# Patient Record
Sex: Male | Born: 1935
Health system: Southern US, Community
[De-identification: ages and names within clinical notes are randomized; demographics above are authoritative.]

## PROBLEM LIST (undated history)

## (undated) DIAGNOSIS — B351 Tinea unguium: Secondary | ICD-10-CM

## (undated) DIAGNOSIS — M5416 Radiculopathy, lumbar region: Secondary | ICD-10-CM

## (undated) DIAGNOSIS — M1990 Unspecified osteoarthritis, unspecified site: Secondary | ICD-10-CM

## (undated) DIAGNOSIS — M67919 Unspecified disorder of synovium and tendon, unspecified shoulder: Secondary | ICD-10-CM

## (undated) DIAGNOSIS — K5790 Diverticulosis of intestine, part unspecified, without perforation or abscess without bleeding: Secondary | ICD-10-CM

## (undated) DIAGNOSIS — R200 Anesthesia of skin: Secondary | ICD-10-CM

## (undated) DIAGNOSIS — M5090 Cervical disc disorder, unspecified, unspecified cervical region: Secondary | ICD-10-CM

## (undated) DIAGNOSIS — N400 Enlarged prostate without lower urinary tract symptoms: Secondary | ICD-10-CM

## (undated) DIAGNOSIS — H43819 Vitreous degeneration, unspecified eye: Secondary | ICD-10-CM

## (undated) DIAGNOSIS — R011 Cardiac murmur, unspecified: Secondary | ICD-10-CM

## (undated) DIAGNOSIS — L600 Ingrowing nail: Secondary | ICD-10-CM

## (undated) DIAGNOSIS — K219 Gastro-esophageal reflux disease without esophagitis: Secondary | ICD-10-CM

## (undated) DIAGNOSIS — H4920 Sixth [abducent] nerve palsy, unspecified eye: Secondary | ICD-10-CM

## (undated) DIAGNOSIS — R202 Paresthesia of skin: Secondary | ICD-10-CM

## (undated) DIAGNOSIS — I2510 Atherosclerotic heart disease of native coronary artery without angina pectoris: Secondary | ICD-10-CM

## (undated) DIAGNOSIS — R7301 Impaired fasting glucose: Secondary | ICD-10-CM

## (undated) HISTORY — DX: Ingrowing nail: L60.0

## (undated) HISTORY — PX: CARDIAC CATHETERIZATION: SHX172

## (undated) HISTORY — DX: Tinea unguium: B35.1

## (undated) HISTORY — DX: Vitreous degeneration, unspecified eye: H43.819

## (undated) HISTORY — PX: BACK SURGERY: SHX140

## (undated) HISTORY — DX: Unspecified disorder of synovium and tendon, unspecified shoulder: M67.919

## (undated) HISTORY — DX: Anesthesia of skin: R20.0

## (undated) HISTORY — DX: Anesthesia of skin: R20.2

## (undated) HISTORY — DX: Cervical disc disorder, unspecified, unspecified cervical region: M50.90

## (undated) HISTORY — DX: Benign prostatic hyperplasia without lower urinary tract symptoms: N40.0

## (undated) HISTORY — DX: Sixth (abducent) nerve palsy, unspecified eye: H49.20

## (undated) HISTORY — DX: Diverticulosis of intestine, part unspecified, without perforation or abscess without bleeding: K57.90

## (undated) HISTORY — DX: Cardiac murmur, unspecified: R01.1

## (undated) HISTORY — PX: OTHER SURGICAL HISTORY: SHX169

## (undated) HISTORY — DX: Impaired fasting glucose: R73.01

## (undated) HISTORY — DX: Radiculopathy, lumbar region: M54.16

## (undated) HISTORY — DX: Unspecified osteoarthritis, unspecified site: M19.90

## (undated) HISTORY — PX: SPINE SURGERY: SHX786

---
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
## 1963-09-18 HISTORY — PX: HEMORRHOID SURGERY: SHX153

## 1994-09-17 HISTORY — PX: HERNIA REPAIR: SHX51

## 1999-08-24 ENCOUNTER — Encounter: Payer: Self-pay | Admitting: Internal Medicine

## 1999-08-24 ENCOUNTER — Encounter: Admission: RE | Admit: 1999-08-24 | Discharge: 1999-08-24 | Payer: Self-pay | Admitting: Internal Medicine

## 1999-09-18 DIAGNOSIS — H4920 Sixth [abducent] nerve palsy, unspecified eye: Secondary | ICD-10-CM

## 1999-09-18 HISTORY — DX: Sixth (abducent) nerve palsy, unspecified eye: H49.20

## 1999-10-03 ENCOUNTER — Encounter: Payer: Self-pay | Admitting: Ophthalmology

## 1999-10-03 ENCOUNTER — Ambulatory Visit (HOSPITAL_COMMUNITY): Admission: RE | Admit: 1999-10-03 | Discharge: 1999-10-03 | Payer: Self-pay | Admitting: Ophthalmology

## 2007-07-17 ENCOUNTER — Ambulatory Visit (HOSPITAL_BASED_OUTPATIENT_CLINIC_OR_DEPARTMENT_OTHER): Admission: RE | Admit: 2007-07-17 | Discharge: 2007-07-18 | Payer: Self-pay | Admitting: Orthopedic Surgery

## 2007-07-17 HISTORY — PX: ROTATOR CUFF REPAIR: SHX139

## 2008-06-24 ENCOUNTER — Encounter: Admission: RE | Admit: 2008-06-24 | Discharge: 2008-06-24 | Payer: Self-pay | Admitting: Internal Medicine

## 2008-07-20 ENCOUNTER — Encounter: Admission: RE | Admit: 2008-07-20 | Discharge: 2008-07-20 | Payer: Self-pay | Admitting: Neurological Surgery

## 2008-08-10 ENCOUNTER — Encounter: Admission: RE | Admit: 2008-08-10 | Discharge: 2008-08-10 | Payer: Self-pay | Admitting: Neurological Surgery

## 2009-02-08 ENCOUNTER — Encounter: Admission: RE | Admit: 2009-02-08 | Discharge: 2009-02-08 | Payer: Self-pay | Admitting: Neurological Surgery

## 2009-09-07 ENCOUNTER — Inpatient Hospital Stay (HOSPITAL_COMMUNITY): Admission: RE | Admit: 2009-09-07 | Discharge: 2009-09-09 | Payer: Self-pay | Admitting: Neurological Surgery

## 2009-09-27 ENCOUNTER — Encounter: Admission: RE | Admit: 2009-09-27 | Discharge: 2009-09-27 | Payer: Self-pay | Admitting: Neurological Surgery

## 2009-12-13 ENCOUNTER — Encounter: Admission: RE | Admit: 2009-12-13 | Discharge: 2009-12-13 | Payer: Self-pay | Admitting: Neurological Surgery

## 2010-03-13 ENCOUNTER — Encounter: Admission: RE | Admit: 2010-03-13 | Discharge: 2010-03-13 | Payer: Self-pay | Admitting: Neurological Surgery

## 2010-06-13 ENCOUNTER — Encounter: Admission: RE | Admit: 2010-06-13 | Discharge: 2010-06-13 | Payer: Self-pay | Admitting: Neurological Surgery

## 2010-07-06 ENCOUNTER — Inpatient Hospital Stay (HOSPITAL_COMMUNITY): Admission: RE | Admit: 2010-07-06 | Discharge: 2010-07-07 | Payer: Self-pay | Admitting: Neurological Surgery

## 2010-07-27 ENCOUNTER — Encounter: Admission: RE | Admit: 2010-07-27 | Discharge: 2010-07-27 | Payer: Self-pay | Admitting: Neurological Surgery

## 2010-08-07 ENCOUNTER — Encounter: Admission: RE | Admit: 2010-08-07 | Discharge: 2010-08-07 | Payer: Self-pay | Admitting: Neurological Surgery

## 2010-10-09 ENCOUNTER — Encounter
Admission: RE | Admit: 2010-10-09 | Discharge: 2010-10-09 | Payer: Self-pay | Source: Home / Self Care | Attending: Neurological Surgery | Admitting: Neurological Surgery

## 2010-11-30 LAB — CBC
MCH: 31.2 pg (ref 26.0–34.0)
MCHC: 34.3 g/dL (ref 30.0–36.0)
RBC: 4.65 MIL/uL (ref 4.22–5.81)
RDW: 13.6 % (ref 11.5–15.5)
WBC: 5.1 10*3/uL (ref 4.0–10.5)

## 2010-11-30 LAB — BASIC METABOLIC PANEL
BUN: 12 mg/dL (ref 6–23)
Chloride: 108 mEq/L (ref 96–112)
GFR calc non Af Amer: >60 mL/min (ref >60)

## 2010-11-30 LAB — SURGICAL PCR SCREEN
MRSA, PCR: NEGATIVE
Staphylococcus aureus: POSITIVE — AB

## 2010-11-30 LAB — DIFFERENTIAL
Lymphocytes Relative: 22 % (ref 12–46)
Lymphs Abs: 1.1 10*3/uL (ref 0.7–4.0)
Monocytes Absolute: 0.5 10*3/uL (ref 0.1–1.0)

## 2010-11-30 LAB — APTT: aPTT: 31 seconds (ref 24–37)

## 2010-11-30 LAB — TYPE AND SCREEN
ABO/RH(D): A NEG
Antibody Screen: NEGATIVE

## 2010-11-30 LAB — PROTIME-INR: Prothrombin Time: 13.2 seconds (ref 11.6–15.2)

## 2010-12-18 LAB — DIFFERENTIAL
Basophils Relative: 0 % (ref 0–1)
Eosinophils Absolute: 0.4 10*3/uL (ref 0.0–0.7)
Monocytes Absolute: 0.7 10*3/uL (ref 0.1–1.0)
Monocytes Relative: 9 % (ref 3–12)
Neutro Abs: 5.1 10*3/uL (ref 1.7–7.7)

## 2010-12-18 LAB — TYPE AND SCREEN

## 2010-12-18 LAB — URINALYSIS, ROUTINE W REFLEX MICROSCOPIC
Bilirubin Urine: NEGATIVE
Ketones, ur: NEGATIVE mg/dL
Nitrite: NEGATIVE
Protein, ur: NEGATIVE mg/dL
Specific Gravity, Urine: 1.006 (ref 1.005–1.030)
Urobilinogen, UA: 0.2 mg/dL (ref 0.0–1.0)

## 2010-12-18 LAB — APTT: aPTT: 31 seconds (ref 24–37)

## 2010-12-18 LAB — CBC
Hemoglobin: 15.2 g/dL (ref 13.0–17.0)
MCHC: 34.6 g/dL (ref 30.0–36.0)
MCV: 91.4 fL (ref 78.0–100.0)
RBC: 4.8 MIL/uL (ref 4.22–5.81)

## 2010-12-18 LAB — URINE MICROSCOPIC-ADD ON

## 2010-12-18 LAB — BASIC METABOLIC PANEL
Chloride: 106 mEq/L (ref 96–112)
Creatinine, Ser: 0.91 mg/dL (ref 0.4–1.5)
GFR calc Af Amer: >60 mL/min (ref >60)

## 2010-12-18 LAB — ABO/RH: ABO/RH(D): A NEG

## 2011-01-09 ENCOUNTER — Ambulatory Visit
Admission: RE | Admit: 2011-01-09 | Discharge: 2011-01-09 | Disposition: A | Discharge status: Home / Self Care | Payer: Medicare Other | Source: Ambulatory Visit | Attending: Neurological Surgery | Admitting: Neurological Surgery

## 2011-01-09 ENCOUNTER — Other Ambulatory Visit: Payer: Self-pay | Admitting: Neurological Surgery

## 2011-01-09 DIAGNOSIS — M549 Dorsalgia, unspecified: Secondary | ICD-10-CM

## 2011-01-30 NOTE — Op Note (Signed)
NAME:  Danny, [name] NO.:  [id number]   MEDICAL RECORD NO.:  [id number]          PATIENT TYPE:  AMB   LOCATION:  DSC                          FACILITY:  [facility]   PHYSICIAN:  Katy Fitch. Sypher, M.D. DATE OF BIRTH:  [birth year]   DATE OF PROCEDURE:  07/17/2007  DATE OF DISCHARGE:                               OPERATIVE REPORT   PREOPERATIVE DIAGNOSIS:  Stage III impingement, left shoulder, with MRI  evidence of severe AC arthropathy, chronic impingement and extensor  rotator cuff tendinopathy with a near full-thickness tear of the  supraspinatus tendon.   POSTOPERATIVE DIAGNOSIS:  Bursal side 95% tear of supraspinatus tendon  retracted approximately 1.5 cm with severe acromioclavicular arthropathy  and labral degenerative fray and mild adhesive capsulitis.   OPERATION:  1. Evaluation of left shoulder under anesthesia.  2. Diagnostic arthroscopy, left shoulder, with debridement of bursa      and partial-thickness biceps tear.  3. Arthroscopic subacromial decompression with bursectomy,      coracoacromial ligament relaxation, acromioplasty and tenolysis of      rotator cuff.  4. Arthroscopic resection of distal clavicle.  5. Arthroscopic repair of supraspinatus retracted rotator cuff tear      utilizing the Arthrex swivel lock and fiber tape.   OPERATING SURGEON:  Josephine [name].   ASSISTANT:  Annye Rusk, P.A.-C.   ANESTHESIA:  General by endotracheal technique supplemented by a left  interscalene block; supervising anesthesiologist is Dr. Sampson Goon.   INDICATIONS:  Danny [name] is a 75 year old gentleman, self-referred  for evaluation and management of a painful left shoulder.   He is an avid Teacher, English as a foreign language.   He had had progressive pain with abduction and external rotation, pain  after playing golf and discomfort when he would lay on the left side  while sleeping.   Clinical examination suggested stage III impingement with weakness of  abduction and  external rotation.  Plain films documented severe AC  arthropathy with unfavorable acromial and distal clavicle inferior  projecting osteophytes and sclerosis at the greater tuberosity of the  humeral head.  An MRI of the shoulder revealed a bursal side tear of the  supraspinatus.   After informed consent, Danny [name] was brought to the operating room  at this time anticipating arthroscopic evaluation of shoulder,  appropriate debridement, subacromial decompression, distal clavicle  resection and either open or arthroscopic repair of his cuff.   Preoperatively, questions were invited and answered in detail.   He was evaluated by Dr. Ivin Booty preoperatively and had an interscalene  block.  Dr. Sampson Goon subsequently was the attending anesthesiologist  during our procedure.   PROCEDURE:  Danny [name] was brought to the operating room and  placed in supine position on the table.   Following anesthesia consult by Dr. Ivin Booty in the holding area, a left  interscalene block was placed without complication.   Danny [name] was brought to Room 2, placed in the supine position upon  the operating table, and under Dr. Jarrett Ables strict supervision,  general anesthesia by endotracheal technique instituted.   He was carefully positioned in the beach-chair position with the aid of  a torso and head holder designed for shoulder arthroscopy.   The entire left shoulder and forequarter were prepped with DuraPrep and  draped with impervious arthroscopy drapes.   The procedure commenced with examination of the shoulder under  anesthesia.   Danny [name] had elevation to 170, external rotation to 85, internal  rotation of 80.  He was stable to all planes of testing anterior,  inferior and posterior with respect to the capsular ligaments.   The arthroscope was introduced through a standard posterior viewing  portal.   He was noted to have mild adhesive capsulitis.  He had a 50% tear of the   biceps tendon at the entrance to the bicipital groove.  The origin of  biceps was stable at the superior glenoid labrum.  The anterior capsular  ligaments were intact.  He had only minor chondromalacia changes on the  humeral head in the central portion and normal-appearing hyaline  cartilage in the glenoid fossa.   The deep surface of the rotator cuff appeared intact from the rotator  interval posteriorly.  The subscapularis had some minor degenerative  changes superiorly.  An anterior portal was created, and a 4.5-mm  suction shaver was used to debride the labrum, adhesive capsulitis  tissues and the biceps tendon.  Photographic documentation of the  pathology was accomplished followed by removal of the arthroscopic  equipment.   The arthroscope was then placed in the subacromial space.  There was a  florid bursitis noted.  After bursectomy, the anatomy of the  coracoacromial arch was studied.  The acromion was quite prominent in  the [facility] joint, and the distal clavicle was prominent with an inferior  projecting osteophyte.  The cutting cautery was used to relax the  coracoacromial ligament followed by use of the suction bur to level the  acromion to a type 1 morphology with removal of the medial AC joint  spur.  The coracoacromial ligament was relaxed with the cutting cautery  followed by release of the capsule of the [facility] joint and resection of the  distal 18-mm of clavicle utilizing a suction bur.   The tear was easily visualized.  The tear was quite favorable for an  arthroscopic repair.   A port of [place] was established, and the lateral portal was used to  debride the greater tuberosity down to a bleeding bone surface.  After  trimming the margins of the degenerative rotator cuff tear, an Arthrex  scorpion was used to place a mattress fiber tape suture that was then  brought over the top to a 5.5-mm lateral Bio-Corkscrew swivel lock  anchor.   This was placed with standard  arthroscopic technique.  The repair was  anatomic with an excellent profile.  The swivel lock anchor was buried  and appeared stable upon testing.   The subacromial space was then thoroughly lavaged of sterile saline  followed by trimming of the sutures.  Photographic documentation of the  final repair was accomplished followed by repair of the portals with  mattress suture of 3-0 Prolene.   Danny [name] was placed in a sling, awakened from general anesthesia and  transferred to the recovery room with stable signs.   He will be admitted to the recovery care center for observation of his  vital signs and IV PCA morphine and p.o. and IV Dilaudid as needed for  pain.  He will receive Ancef 1 gram IV q.8h. x3 doses.      Katy Fitch Sypher, M.D.  Electronically  Signed     RVS/MEDQ  D:  07/17/2007  T:  07/18/2007  Job:  [id number]   cc:   Theressa Millard, M.D.  Katy Fitch Sypher, M.D.

## 2011-04-10 ENCOUNTER — Other Ambulatory Visit: Payer: Self-pay | Admitting: Neurological Surgery

## 2011-04-10 ENCOUNTER — Ambulatory Visit
Admission: RE | Admit: 2011-04-10 | Discharge: 2011-04-10 | Disposition: A | Discharge status: Home / Self Care | Payer: Medicare Other | Source: Ambulatory Visit | Attending: Neurological Surgery | Admitting: Neurological Surgery

## 2011-04-10 DIAGNOSIS — M48061 Spinal stenosis, lumbar region without neurogenic claudication: Secondary | ICD-10-CM

## 2011-04-10 DIAGNOSIS — M4120 Other idiopathic scoliosis, site unspecified: Secondary | ICD-10-CM

## 2011-04-10 DIAGNOSIS — M5137 Other intervertebral disc degeneration, lumbosacral region: Secondary | ICD-10-CM

## 2011-04-10 DIAGNOSIS — M79609 Pain in unspecified limb: Secondary | ICD-10-CM

## 2011-05-22 ENCOUNTER — Telehealth (INDEPENDENT_AMBULATORY_CARE_PROVIDER_SITE_OTHER): Payer: Self-pay | Admitting: Surgery

## 2011-05-22 NOTE — Telephone Encounter (Signed)
I moved wife's appt to 9:40am on that same day so that they could have appts back to back. Wife agreed with this plan.

## 2011-06-06 ENCOUNTER — Encounter: Payer: Self-pay | Admitting: Cardiology

## 2011-06-07 ENCOUNTER — Encounter: Payer: Self-pay | Admitting: Cardiology

## 2011-06-07 ENCOUNTER — Ambulatory Visit (INDEPENDENT_AMBULATORY_CARE_PROVIDER_SITE_OTHER): Payer: Medicare Other | Admitting: Cardiology

## 2011-06-07 DIAGNOSIS — R9430 Abnormal result of cardiovascular function study, unspecified: Secondary | ICD-10-CM

## 2011-06-07 DIAGNOSIS — R9439 Abnormal result of other cardiovascular function study: Secondary | ICD-10-CM

## 2011-06-07 DIAGNOSIS — R0989 Other specified symptoms and signs involving the circulatory and respiratory systems: Secondary | ICD-10-CM

## 2011-06-07 DIAGNOSIS — R0609 Other forms of dyspnea: Secondary | ICD-10-CM

## 2011-06-07 LAB — BASIC METABOLIC PANEL
BUN: 16 mg/dL (ref 6–23)
CO2: 27 mEq/L (ref 19–32)
Chloride: 104 mEq/L (ref 96–112)
Creatinine, Ser: 0.8 mg/dL (ref 0.4–1.5)
Glucose, Bld: 101 mg/dL — ABNORMAL HIGH (ref 70–99)

## 2011-06-07 NOTE — Patient Instructions (Signed)
.  Your physician recommends that you have lab today---BMP/BNP 786.09  Your physician has requested that you have cardiac CT. Cardiac computed tomography (CT) is a painless test that uses an x-ray machine to take clear, detailed pictures of your heart. For further information please visit [email or web address] Please follow instruction sheet as given.  Take metoprolol 25mg  the night before the the cardiac CT and the morning of the cardiac CT about  1-2 hours before the cardiac CT.  You do not need to schedule a follow-up appt with Dr Shirlee Latch.

## 2011-06-08 ENCOUNTER — Ambulatory Visit (INDEPENDENT_AMBULATORY_CARE_PROVIDER_SITE_OTHER): Payer: Medicare Other | Admitting: Surgery

## 2011-06-08 ENCOUNTER — Encounter (INDEPENDENT_AMBULATORY_CARE_PROVIDER_SITE_OTHER): Payer: Self-pay | Admitting: Surgery

## 2011-06-08 DIAGNOSIS — D179 Benign lipomatous neoplasm, unspecified: Secondary | ICD-10-CM | POA: Insufficient documentation

## 2011-06-08 NOTE — Progress Notes (Signed)
NAME: Danny [name]                                                                                      DOB: 1936-03-04 DATE: 06/08/2011               MRN: [id number]   CC:  Chief Complaint  Patient presents with  . Lipoma    back lipoma    HPI:  Danny [name] is a 75 y.o.  male who presents with He had a small mass of the back noted incidentally when he was having some physical therapy for his back. He showed this to his orthopedist, Dr.Sypher, who thought it was likely a lipoma but ask the patient to see [place].  It is asymptomatic. He was unaware of it prior to a couple of months ago when it was first noticed.  PMH:   has a past medical history of Diverticulosis; Hepatitis, chronic; Sixth nerve palsy (2001); Impaired fasting glucose; Hemorrhoids; Allergic rhinitis; Detached vitreous humor; Lumbar radiculopathy; BPH (benign prostatic hypertrophy); Ingrown toenail; Rotator cuff disorder; Onychomycosis; Arthritis; and Heart murmur.  PSH:   has past surgical history that includes abnormal stress test; Spine surgery (07/06/10 and 09/07/09); Rotator cuff repair (07/17/2007); Hernia repair (1996); Hemorrhoid surgery (1965); Appendectomy (1957); and Tonsillectomy and adenoidectomy (1947).  ALLERGIES:  No Known Allergies  MEDICATIONS:   Current Outpatient Prescriptions  Medication Sig Dispense Refill  . aspirin 81 MG tablet Take 81 mg by mouth daily.        . finasteride (PROSCAR) 5 MG tablet Take 5 mg by mouth daily.        . Glucosamine 750 MG TABS Take by mouth daily.        [name] ibuprofen (ADVIL,MOTRIN) 800 MG tablet Take 800 mg by mouth 3 (three) times daily.        . Multiple Vitamin (MULTIVITAMIN) tablet Take 1 tablet by mouth daily.        [name] omeprazole (PRILOSEC) 20 MG capsule Take 20 mg by mouth daily.        . pregabalin (LYRICA) 100 MG capsule Take 100 mg by mouth daily.         ROS: His 12 system RO past was negative except for history of some constipation and some  arthritis pains. He was recently diagnosed with a heart murmur but is asymptomatic and this apparently is not thought to be significant.  EXAM:   General: The patient is alert oriented and in no distress Back: There is a 3 x 4 cm mobile soft well-circumscribed mass over the medial aspect of the left scapula. It is nontender. It is consistent with a lipoma.  DATA REVIEWED:   He had MRI scans done and this area did not appear abnormal. It's unclear to me whether they actually imaged the exact area of the mass.  IMPRESSION:  Lipoma of the back  PLAN:   I discussed the diagnosis with the patient and alternatives which are of excision or simple followup. I told him this is not malignant. His surgery would require a general anesthetic that could be done as an outpatient. I also told him he  would need to limit activities for two or three weeks including no golf after surgery. I also told him that it could also be scheduled if he was having additional shoulder surgery so that we could do it under the same anesthesia. This would depend on his orthopedist agreeing.

## 2011-06-08 NOTE — Patient Instructions (Signed)
I think the small mass on your back is a lipoma or fatty tumor. These are benign.  This could be removed as an outpatient but I think will require being put to sleep. It can also be left alone and removed only if it becomes symptomatic.  Another option would be to remove it the time he were having other surgery and done under the same anesthetic.  Come back to see me if you decides to have surgery

## 2011-06-10 DIAGNOSIS — R0609 Other forms of dyspnea: Secondary | ICD-10-CM | POA: Insufficient documentation

## 2011-06-10 NOTE — Progress Notes (Signed)
PCP: Dr. Theressa Millard  75 yo with no prior cardiac history presents for followup of abnormal myoview.  Patient has noted exertional dyspnea walking on the golf course for at least a year.  His friends and wife have begun to notice heavy breathing when he exerts himself.  He says that he will feel subjectively short of breath after walking around the block but will not have to stop walking.  He thinks he could walk for up to an hour without stopping, but he would be subjectively short of breath.  He feels more fatigued after exertion than in the past.  No chest pain or tightness.  He was sent to see Dr. Mayford Knife, who set him up for an ETT-myoview.  He had good exercise tolerance, reaching 11 METS, with normal rest and stress perfusion.  However, he had an elevated TID ratio.  Given the equivocal findings on myoview, Dr. Mayford Knife sent him here for assessment for coronary CT angiogram.    ECG: NSR, 1st degree AV block  PMH: 1. Rotator cuff surgery 2. Diverticulosis 3. Chronic persistent hepatitis 4. Impaired fasting glucose 5. 6th nerve palsy in 2001 6. Allergic rhinitis 7. Low back pain s/p two L-spine operations 8. Exertional dyspnea: ETT-myoview (8/12) with 11 METS, normal LV EF, normal perfusion at rest and stress but elevated TID ratio.   FH: Father with CVA at 94  SH: Married, never smoked, rare ETOH.   ROS: All systems reviewed and negative except as per HPI.   Current Outpatient Prescriptions  Medication Sig Dispense Refill  . aspirin 81 MG tablet Take 81 mg by mouth daily.        . finasteride (PROSCAR) 5 MG tablet Take 5 mg by mouth daily.        [name] ibuprofen (ADVIL,MOTRIN) 800 MG tablet Take 800 mg by mouth 3 (three) times daily.        . Multiple Vitamin (MULTIVITAMIN) tablet Take 1 tablet by mouth daily.        [name] omeprazole (PRILOSEC) 20 MG capsule Take 20 mg by mouth daily.        . pregabalin (LYRICA) 100 MG capsule Take 100 mg by mouth daily.       . Glucosamine 750 MG TABS  Take by mouth daily.          BP 127/90  Pulse 65  Ht 5\' 11"  (1.803 m)  Wt 168 lb 1.9 oz (76.259 kg)  BMI 23.45 kg/m2 General: NAD Neck: No JVD, no thyromegaly or thyroid nodule.  Lungs: Clear to auscultation bilaterally with normal respiratory effort. CV: Nondisplaced PMI.  Heart regular S1/S2, no S3/S4, 1/6 HSM at apex.  No peripheral edema.  No carotid bruit.  Normal pedal pulses.  Abdomen: Soft, nontender, no hepatosplenomegaly, no distention.  Skin: Intact without lesions or rashes.  Neurologic: Alert and oriented x 3.  Psych: Normal affect. Extremities: No clubbing or cyanosis.  HEENT: Normal.

## 2011-06-10 NOTE — Assessment & Plan Note (Signed)
Patient has good overall exercise tolerance as evidenced by his performance on the ETT.  However, he gets subjectively short of breath with exertion and is noted by others to breathe hard.  No chest pain.  Myoview showed normal perfusion but the TID ratio was elevated.  He has minimal coronary risk factors beyond age and gender.  In this situation, with an equivocal myoview, I think that coronary CT angiography would be very reasonable.  I will set him up for a coronary CTA with pre-test beta blockade.  I will also check a BNP.

## 2011-06-18 ENCOUNTER — Encounter: Payer: Self-pay | Admitting: Cardiology

## 2011-06-20 ENCOUNTER — Ambulatory Visit (HOSPITAL_COMMUNITY)
Admission: RE | Admit: 2011-06-20 | Discharge: 2011-06-20 | Disposition: A | Discharge status: Home / Self Care | Payer: Medicare Other | Source: Ambulatory Visit | Attending: Cardiology | Admitting: Cardiology

## 2011-06-20 ENCOUNTER — Encounter (HOSPITAL_COMMUNITY): Payer: Self-pay

## 2011-06-20 DIAGNOSIS — R0609 Other forms of dyspnea: Secondary | ICD-10-CM

## 2011-06-20 DIAGNOSIS — R9439 Abnormal result of other cardiovascular function study: Secondary | ICD-10-CM

## 2011-06-20 DIAGNOSIS — I2510 Atherosclerotic heart disease of native coronary artery without angina pectoris: Secondary | ICD-10-CM | POA: Insufficient documentation

## 2011-06-20 DIAGNOSIS — J438 Other emphysema: Secondary | ICD-10-CM | POA: Insufficient documentation

## 2011-06-20 DIAGNOSIS — R9430 Abnormal result of cardiovascular function study, unspecified: Secondary | ICD-10-CM

## 2011-06-20 DIAGNOSIS — R0989 Other specified symptoms and signs involving the circulatory and respiratory systems: Secondary | ICD-10-CM

## 2011-06-20 MED ORDER — IOHEXOL 350 MG/ML SOLN
80.0000 mL | Freq: Once | INTRAVENOUS | Status: AC | PRN
  Administered 2011-06-20: 80 mL via INTRAVENOUS

## 2011-06-21 ENCOUNTER — Encounter: Payer: Self-pay | Admitting: *Deleted

## 2011-06-21 ENCOUNTER — Telehealth: Payer: Self-pay | Admitting: *Deleted

## 2011-06-21 ENCOUNTER — Other Ambulatory Visit (INDEPENDENT_AMBULATORY_CARE_PROVIDER_SITE_OTHER): Payer: Medicare Other | Admitting: *Deleted

## 2011-06-21 DIAGNOSIS — Z01810 Encounter for preprocedural cardiovascular examination: Secondary | ICD-10-CM

## 2011-06-21 DIAGNOSIS — I2510 Atherosclerotic heart disease of native coronary artery without angina pectoris: Secondary | ICD-10-CM

## 2011-06-21 DIAGNOSIS — R0989 Other specified symptoms and signs involving the circulatory and respiratory systems: Secondary | ICD-10-CM

## 2011-06-21 DIAGNOSIS — R9430 Abnormal result of cardiovascular function study, unspecified: Secondary | ICD-10-CM

## 2011-06-21 DIAGNOSIS — R0602 Shortness of breath: Secondary | ICD-10-CM

## 2011-06-21 DIAGNOSIS — R0609 Other forms of dyspnea: Secondary | ICD-10-CM

## 2011-06-21 LAB — BASIC METABOLIC PANEL
BUN: 15 mg/dL (ref 6–23)
CO2: 26 mEq/L (ref 19–32)
Calcium: 10.2 mg/dL (ref 8.4–10.5)
Creatinine, Ser: 1 mg/dL (ref 0.4–1.5)
Glucose, Bld: 94 mg/dL (ref 70–99)

## 2011-06-21 LAB — CBC WITH DIFFERENTIAL/PLATELET
Basophils Absolute: 0 10*3/uL (ref 0.0–0.1)
Eosinophils Absolute: 0.4 10*3/uL (ref 0.0–0.7)
Lymphocytes Relative: 19.3 % (ref 12.0–46.0)
MCHC: 34.2 g/dL (ref 30.0–36.0)
MCV: 92.1 fl (ref 78.0–100.0)
Monocytes Absolute: 0.7 10*3/uL (ref 0.1–1.0)
Neutrophils Relative %: 66.6 % (ref 43.0–77.0)
RDW: 13.3 % (ref 11.5–14.6)

## 2011-06-21 LAB — PROTIME-INR: INR: 0.98 (ref <1.50)

## 2011-06-21 MED ORDER — NITROGLYCERIN 0.4 MG SL SUBL: 0.4000 mg | SUBLINGUAL_TABLET | SUBLINGUAL | Status: DC | PRN

## 2011-06-21 MED ORDER — ATORVASTATIN CALCIUM 20 MG PO TABS: 20.0000 mg | ORAL_TABLET | Freq: Every day | ORAL | Status: DC

## 2011-06-21 NOTE — Telephone Encounter (Signed)
Pt needs to reschedule LHC from 06/25/11 to 07/12/11. I have rescheduled cath to 07/12/11 at 8:30am. Dr Shirlee Latch is prescribing lipitor 20mg  daily, nitroglycerin prn and aspirin 81mg  daily.

## 2011-06-21 NOTE — Telephone Encounter (Signed)
Pt schedule for LHC JV lab 06/25/11 8:30am

## 2011-06-22 ENCOUNTER — Telehealth: Payer: Self-pay | Admitting: *Deleted

## 2011-07-06 NOTE — Telephone Encounter (Signed)
Error--nt 

## 2011-07-09 ENCOUNTER — Other Ambulatory Visit (INDEPENDENT_AMBULATORY_CARE_PROVIDER_SITE_OTHER): Payer: Medicare Other | Admitting: *Deleted

## 2011-07-09 DIAGNOSIS — R0602 Shortness of breath: Secondary | ICD-10-CM

## 2011-07-09 DIAGNOSIS — R9430 Abnormal result of cardiovascular function study, unspecified: Secondary | ICD-10-CM

## 2011-07-09 LAB — CBC WITH DIFFERENTIAL/PLATELET
Basophils Absolute: 0 10*3/uL (ref 0.0–0.1)
Eosinophils Absolute: 0.4 10*3/uL (ref 0.0–0.7)
HCT: 40.5 % (ref 39.0–52.0)
Hemoglobin: 13.8 g/dL (ref 13.0–17.0)
Lymphs Abs: 1.4 10*3/uL (ref 0.7–4.0)
MCHC: 34.1 g/dL (ref 30.0–36.0)
MCV: 93.1 fl (ref 78.0–100.0)
Monocytes Absolute: 0.6 10*3/uL (ref 0.1–1.0)
Neutro Abs: 4.9 10*3/uL (ref 1.4–7.7)
RDW: 13.8 % (ref 11.5–14.6)

## 2011-07-09 LAB — PROTIME-INR: Prothrombin Time: 10.4 s (ref 10.2–12.4)

## 2011-07-09 LAB — BASIC METABOLIC PANEL
Calcium: 9.3 mg/dL (ref 8.4–10.5)
Creatinine, Ser: 0.9 mg/dL (ref 0.4–1.5)

## 2011-07-12 ENCOUNTER — Inpatient Hospital Stay (HOSPITAL_BASED_OUTPATIENT_CLINIC_OR_DEPARTMENT_OTHER)
Admission: RE | Admit: 2011-07-12 | Discharge: 2011-07-12 | Disposition: A | Discharge status: Home / Self Care | Payer: Medicare Other | Source: Ambulatory Visit | Attending: Cardiology | Admitting: Cardiology

## 2011-07-12 DIAGNOSIS — I2510 Atherosclerotic heart disease of native coronary artery without angina pectoris: Secondary | ICD-10-CM

## 2011-07-12 DIAGNOSIS — R0609 Other forms of dyspnea: Secondary | ICD-10-CM | POA: Insufficient documentation

## 2011-07-12 DIAGNOSIS — R0989 Other specified symptoms and signs involving the circulatory and respiratory systems: Secondary | ICD-10-CM | POA: Insufficient documentation

## 2011-07-21 NOTE — Cardiovascular Report (Signed)
  NAME:  [name], [name] NO.:  [id number]  MEDICAL RECORD NO.:  [id number]  LOCATION:  CATS                         FACILITY:  [facility]  PHYSICIAN:  Marca Ancona, MD      DATE OF BIRTH:  1935-12-03  DATE OF PROCEDURE:  07/12/2011 DATE OF DISCHARGE:  06/20/2011                           CARDIAC CATHETERIZATION   PROCEDURE: 1. Left heart catheterization. 2. Coronary angiography. 3. Left ventriculography.  SURGEON:  Marca Ancona, MD  INDICATION:  This is a 75 year old with a history of exertional dyspnea. He had an equivocal Myoview and then had a coronary CT angiogram.  That was a difficult task, but did not visualize the circumflex.  Therefore, it was decided to take him for cardiac catheterization to define his anatomy.  PROCEDURE NOTE:  After informed consent was obtained, the patient underwent Allen testing on his right wrist.  The right ulnar artery gave good collateral circulation to the radial side of the hand constituting positive Allen's test.  The patient's right wrist was sterilely prepped and draped.  Lidocaine 1% was used to locally anesthetize the right radial area.  The right radial artery was entered using modified Seldinger technique and a 5-French arterial sheath was placed.  The patient received 3 mg intraarterial verapamil and 4000 units IV heparin.  The right coronary artery was engaged using the JL-4 catheter and left coronary artery was engaged using the JL 3.0 guide catheter and the left ventricle was entered using angled pigtail catheter.  There were no complications.  FINDINGS: 1. Hemodynamic:  LV 130/24, aorta 120/73. 2. Left ventriculography:  EF was estimated at 55-60%.  There were no     regional wall motion abnormalities in the RAO projection. 3. Right coronary artery:  The right coronary artery was dominant.     There was about a 30% mid and a 40% distal right coronary artery     stenosis. 4. Left main:  The left main had no  significant disease. 5. Left circumflex system:  The left circumflex had mild luminal     irregularities. 6. LAD system:  There were small first and second diagonals.     Following the second diagonal still in the proximal LAD, there is     about a 40% stenosis.  The remainder of the LAD had mild luminal     irregularities.  IMPRESSION:  The patient has mild coronary disease.  There is nothing that is significantly obstructive.  We will plan on medical management to include a statin.     Marca Ancona, MD     DM/MEDQ  D:  07/12/2011  T:  07/12/2011  Job:  [id number]  cc:   Theressa Millard, M.D.  Electronically Signed by Marca Ancona MD on 07/21/2011 11:30:58 PM

## 2011-07-31 ENCOUNTER — Encounter (INDEPENDENT_AMBULATORY_CARE_PROVIDER_SITE_OTHER): Payer: Self-pay | Admitting: Surgery

## 2011-07-31 ENCOUNTER — Ambulatory Visit (INDEPENDENT_AMBULATORY_CARE_PROVIDER_SITE_OTHER): Payer: Medicare Other | Admitting: Surgery

## 2011-07-31 DIAGNOSIS — D179 Benign lipomatous neoplasm, unspecified: Secondary | ICD-10-CM

## 2011-07-31 NOTE — Patient Instructions (Signed)
We will schedule outpatient surgery to remove the lipoma on the left back Call if you have any questions

## 2011-07-31 NOTE — Progress Notes (Signed)
Chief complaint: Lipoma  History of present illness: I saw this patient in September with a small lipoma of the back near the bottom of his left scapula. He really would like to have this removed although it remains essentially asymptomatic. We had initially talked about doing it at the same time he would be having shoulder surgery, but his shoulder is now better and he will not need shoulder surgery.  Past history, family history, and review of systems are unchanged from his prior visit in September.  Physical exam:  GENERAL:  The patient is alert, oriented, and generally healthy-appearing, NAD. Mood and affect are normal.  HEENT:  The head is normocephalic, the eyes nonicteric, the pupils were round regular and equal. EOMs are normal. Pharynx normal. Dentition good.  NECK:  The neck is supple and there are no masses or thyromegaly.  LUNGS:  Normal respirations and clear to auscultation.  HEART:  Regular rhythm, with no murmurs rubs or gallops. Pulses are intact carotid dorsalis pedis and posterior tibial. No significant varicosities are noted.  ABDOMEN:  Soft, flat, and nontender. No masses or organomegaly is noted.Small UH noted. Diastasis noted Bowel sounds are normal.  EXTREMITIES:  Good range of motion, no edema.  BACK: 4 cm lipoma at bottom of L scapula    Impression: 4 cm lipoma of back  Plan: We will schedule him to have excision of this done as an outpatient with either general or IV sedation and local anesthesia. We will let his cardiologist know, but he apparently does not any further evaluation having had a recent stress test.

## 2011-08-01 ENCOUNTER — Encounter: Payer: Medicare Other | Admitting: Cardiology

## 2011-08-07 ENCOUNTER — Other Ambulatory Visit: Payer: Self-pay | Admitting: *Deleted

## 2011-08-07 ENCOUNTER — Ambulatory Visit (INDEPENDENT_AMBULATORY_CARE_PROVIDER_SITE_OTHER): Payer: Medicare Other | Admitting: Cardiology

## 2011-08-07 ENCOUNTER — Encounter: Payer: Self-pay | Admitting: Cardiology

## 2011-08-07 DIAGNOSIS — R0609 Other forms of dyspnea: Secondary | ICD-10-CM

## 2011-08-07 DIAGNOSIS — R0602 Shortness of breath: Secondary | ICD-10-CM

## 2011-08-07 DIAGNOSIS — I2510 Atherosclerotic heart disease of native coronary artery without angina pectoris: Secondary | ICD-10-CM

## 2011-08-07 DIAGNOSIS — R9430 Abnormal result of cardiovascular function study, unspecified: Secondary | ICD-10-CM

## 2011-08-07 MED ORDER — ATORVASTATIN CALCIUM 20 MG PO TABS: 20.0000 mg | ORAL_TABLET | Freq: Every day | ORAL | Status: DC

## 2011-08-07 NOTE — Assessment & Plan Note (Signed)
Mild nonobstructive CAD on left heart cath.  Continue ASA 81 and atorvastatin.  Goal LDL should be < 70 to limit progression of CAD and MI risk.

## 2011-08-07 NOTE — Assessment & Plan Note (Signed)
This is relatively mild and I suspect noncardiac.  He actually has quite good exercise tolerance.  I would not recommend further testing.  Continue regular exercise.

## 2011-08-07 NOTE — Patient Instructions (Signed)
Your physician wants you to follow-up in: 1 year with Dr McLean. (November 2013). You will receive a reminder letter in the mail two months in advance. If you don't receive a letter, please call our office to schedule the follow-up appointment.  

## 2011-08-07 NOTE — Progress Notes (Signed)
PCP: Dr. Theressa Millard  75 yo with no prior cardiac history presented initially for followup of abnormal myoview.  Patient had noted exertional dyspnea walking on the golf course for at least a year.  His friends and wife had begun to notice heavy breathing when he exerts himself.  He will feel subjectively short of breath after walking around the block but will not have to stop walking.  He thinks he could walk for up to an hour without stopping, but he would be subjectively short of breath.  He feels more fatigued after exertion than in the past.  No chest pain or tightness.  He was sent to see Dr. Mayford Knife, who set him up for an ETT-myoview.  He had good exercise tolerance, reaching 11 METS, with normal rest and stress perfusion.  However, he had an elevated TID ratio.  Given the equivocal findings on myoview, Dr. Mayford Knife sent him here for assessment for coronary CT angiogram.  Coronary CT angiogram showed minimal disease in the LAD and RCA but there was possible CFX occlusion (versus artifact).  I thought artifact was most likely, but given symptoms and equivocal study, I thought left heart cath would probably be the best approach.  I did a left heart cath in 10/12 that showed mild nonobstructive coronary disease and normal EF.    PMH: 1. Rotator cuff surgery 2. Diverticulosis 3. Chronic persistent hepatitis 4. Impaired fasting glucose 5. 6th nerve palsy in 2001 6. Allergic rhinitis 7. Low back pain s/p two L-spine operations 8. CAD: Patient developed exertional dyspnea.  ETT-myoview (8/12) with 11 METS, normal LV EF, normal perfusion at rest and stress but elevated TID ratio.  Coronary CT angiogram in 9/12 showed equivocal significant disease in the CFX.  Cath in 10/12 showed EF 55-60%, 30% mid RCA, 40% distal RCA, 40% proximal LAD.   FH: Father with CVA at 63  SH: Married, never smoked, rare ETOH.   Current Outpatient Prescriptions  Medication Sig Dispense Refill  . aspirin 81 MG tablet Take  81 mg by mouth daily.        [name] atorvastatin (LIPITOR) 20 MG tablet Take 1 tablet (20 mg total) by mouth daily.  60 tablet  2  . Coenzyme Q10 (COQ10) 200 MG CAPS Take by mouth daily.        . finasteride (PROSCAR) 5 MG tablet Take 5 mg by mouth daily.        . Glucosamine 750 MG TABS Take by mouth as needed.       [name] ibuprofen (ADVIL,MOTRIN) 800 MG tablet Take 800 mg by mouth 3 (three) times daily.        . Multiple Vitamin (MULTIVITAMIN) tablet Take 1 tablet by mouth daily.        . nitroGLYCERIN (NITROSTAT) 0.4 MG SL tablet Place 1 tablet (0.4 mg total) under the tongue every 5 (five) minutes as needed for chest pain.  90 tablet  3  . omeprazole (PRILOSEC) 20 MG capsule Take 20 mg by mouth daily.        . pregabalin (LYRICA) 100 MG capsule Take 100 mg by mouth daily.       [name] DISCONTD: atorvastatin (LIPITOR) 20 MG tablet Take 1 tablet (20 mg total) by mouth daily.  30 tablet  3    BP 132/88  Pulse 72  Ht 5\' 10"  (1.778 m)  Wt 79.833 kg (176 lb)  BMI 25.25 kg/m2 General: NAD Neck: No JVD, no thyromegaly or thyroid nodule.  Lungs: Clear  to auscultation bilaterally with normal respiratory effort. CV: Nondisplaced PMI.  Heart regular S1/S2, no S3/S4, 1/6 HSM at apex.  No peripheral edema.  No carotid bruit.  Normal pedal pulses.  Abdomen: Soft, nontender, no hepatosplenomegaly, no distention.  Skin: Intact without lesions or rashes.  Neurologic: Alert and oriented x 3.  Psych: Normal affect. Extremities: No clubbing or cyanosis.  HEENT: Normal.

## 2011-08-27 ENCOUNTER — Other Ambulatory Visit: Payer: Medicare Other | Admitting: *Deleted

## 2011-08-29 ENCOUNTER — Other Ambulatory Visit: Payer: Medicare Other | Admitting: *Deleted

## 2011-08-30 ENCOUNTER — Encounter (HOSPITAL_BASED_OUTPATIENT_CLINIC_OR_DEPARTMENT_OTHER): Payer: Self-pay | Admitting: *Deleted

## 2011-08-30 NOTE — Progress Notes (Signed)
Saw dr Daylene Posey test,cath,echo done-no tx except meds All done 9/12-10/12 No labs needed Arrive with wife dos Physical with dr Earl Gala 09/04/11

## 2011-09-04 ENCOUNTER — Encounter: Payer: Self-pay | Admitting: Cardiology

## 2011-09-05 ENCOUNTER — Encounter (HOSPITAL_BASED_OUTPATIENT_CLINIC_OR_DEPARTMENT_OTHER)
Admission: RE | Disposition: A | Discharge status: Home / Self Care | Payer: Self-pay | Source: Ambulatory Visit | Attending: Surgery

## 2011-09-05 ENCOUNTER — Ambulatory Visit (HOSPITAL_BASED_OUTPATIENT_CLINIC_OR_DEPARTMENT_OTHER)
Admission: RE | Admit: 2011-09-05 | Discharge: 2011-09-05 | Disposition: A | Discharge status: Home / Self Care | Payer: Medicare Other | Source: Ambulatory Visit | Attending: Surgery | Admitting: Surgery

## 2011-09-05 ENCOUNTER — Encounter: Payer: Self-pay | Admitting: Cardiology

## 2011-09-05 ENCOUNTER — Ambulatory Visit (HOSPITAL_BASED_OUTPATIENT_CLINIC_OR_DEPARTMENT_OTHER): Payer: Medicare Other | Admitting: Anesthesiology

## 2011-09-05 ENCOUNTER — Encounter (HOSPITAL_BASED_OUTPATIENT_CLINIC_OR_DEPARTMENT_OTHER): Payer: Self-pay | Admitting: Anesthesiology

## 2011-09-05 ENCOUNTER — Encounter (HOSPITAL_BASED_OUTPATIENT_CLINIC_OR_DEPARTMENT_OTHER): Payer: Self-pay

## 2011-09-05 ENCOUNTER — Other Ambulatory Visit (INDEPENDENT_AMBULATORY_CARE_PROVIDER_SITE_OTHER): Payer: Self-pay | Admitting: Surgery

## 2011-09-05 DIAGNOSIS — D1739 Benign lipomatous neoplasm of skin and subcutaneous tissue of other sites: Secondary | ICD-10-CM | POA: Insufficient documentation

## 2011-09-05 DIAGNOSIS — I2510 Atherosclerotic heart disease of native coronary artery without angina pectoris: Secondary | ICD-10-CM | POA: Insufficient documentation

## 2011-09-05 DIAGNOSIS — R0602 Shortness of breath: Secondary | ICD-10-CM | POA: Insufficient documentation

## 2011-09-05 DIAGNOSIS — Z01812 Encounter for preprocedural laboratory examination: Secondary | ICD-10-CM | POA: Insufficient documentation

## 2011-09-05 HISTORY — PX: LIPOMA EXCISION: SHX5283

## 2011-09-05 HISTORY — DX: Atherosclerotic heart disease of native coronary artery without angina pectoris: I25.10

## 2011-09-05 SURGERY — EXCISION LIPOMA
Anesthesia: General | Site: Back | Laterality: Left | Wound class: Clean

## 2011-09-05 MED ORDER — DEXAMETHASONE SODIUM PHOSPHATE 4 MG/ML IJ SOLN
INTRAMUSCULAR | Status: DC | PRN
  Administered 2011-09-05: 10 mg via INTRAVENOUS

## 2011-09-05 MED ORDER — SUCCINYLCHOLINE CHLORIDE 20 MG/ML IJ SOLN
INTRAMUSCULAR | Status: DC | PRN
  Administered 2011-09-05: 100 mg via INTRAVENOUS

## 2011-09-05 MED ORDER — HYDROCODONE-ACETAMINOPHEN 5-325 MG PO TABS: 1.0000 | ORAL_TABLET | Freq: Four times a day (QID) | ORAL | Status: AC | PRN

## 2011-09-05 MED ORDER — MIDAZOLAM HCL 2 MG/2ML IJ SOLN: 0.5000 mg | INTRAMUSCULAR | Status: DC | PRN

## 2011-09-05 MED ORDER — LIDOCAINE HCL (CARDIAC) 20 MG/ML IV SOLN
INTRAVENOUS | Status: DC | PRN
  Administered 2011-09-05: 60 mg via INTRAVENOUS

## 2011-09-05 MED ORDER — PROPOFOL 10 MG/ML IV EMUL
INTRAVENOUS | Status: DC | PRN
  Administered 2011-09-05: 200 mg via INTRAVENOUS

## 2011-09-05 MED ORDER — METOCLOPRAMIDE HCL 5 MG/ML IJ SOLN
INTRAMUSCULAR | Status: DC | PRN
  Administered 2011-09-05: 10 mg via INTRAVENOUS

## 2011-09-05 MED ORDER — FENTANYL CITRATE 0.05 MG/ML IJ SOLN: 25.0000 ug | INTRAMUSCULAR | Status: DC | PRN

## 2011-09-05 MED ORDER — MORPHINE SULFATE 2 MG/ML IJ SOLN: 0.0500 mg/kg | INTRAMUSCULAR | Status: DC | PRN

## 2011-09-05 MED ORDER — BUPIVACAINE HCL (PF) 0.25 % IJ SOLN
INTRAMUSCULAR | Status: DC | PRN
  Administered 2011-09-05: 20 mL

## 2011-09-05 MED ORDER — FENTANYL CITRATE 0.05 MG/ML IJ SOLN
INTRAMUSCULAR | Status: DC | PRN
  Administered 2011-09-05: 100 ug via INTRAVENOUS

## 2011-09-05 MED ORDER — METOCLOPRAMIDE HCL 5 MG/ML IJ SOLN: 10.0000 mg | Freq: Once | INTRAMUSCULAR | Status: DC | PRN

## 2011-09-05 MED ORDER — FENTANYL CITRATE 0.05 MG/ML IJ SOLN: 50.0000 ug | INTRAMUSCULAR | Status: DC | PRN

## 2011-09-05 MED ORDER — ONDANSETRON HCL 4 MG/2ML IJ SOLN
INTRAMUSCULAR | Status: DC | PRN
  Administered 2011-09-05: 4 mg via INTRAVENOUS

## 2011-09-05 MED ORDER — LACTATED RINGERS IV SOLN
INTRAVENOUS | Status: DC
  Administered 2011-09-05 (×2): via INTRAVENOUS

## 2011-09-05 SURGICAL SUPPLY — 40 items
ADH SKN CLS APL DERMABOND .7 (GAUZE/BANDAGES/DRESSINGS) ×1
BANDAGE ELASTIC 4 VELCRO ST LF (GAUZE/BANDAGES/DRESSINGS) IMPLANT
BLADE HEX COATED 2.75 (ELECTRODE) ×2 IMPLANT
BLADE SURG 15 STRL LF DISP TIS (BLADE) ×1 IMPLANT
BLADE SURG 15 STRL SS (BLADE) ×2
CANISTER SUCTION 1200CC (MISCELLANEOUS) IMPLANT
CHLORAPREP W/TINT 26ML (MISCELLANEOUS) ×2 IMPLANT
COVER MAYO STAND STRL (DRAPES) ×2 IMPLANT
COVER TABLE BACK 60X90 (DRAPES) ×2 IMPLANT
DECANTER SPIKE VIAL GLASS SM (MISCELLANEOUS) IMPLANT
DERMABOND ADVANCED (GAUZE/BANDAGES/DRESSINGS) ×1
DERMABOND ADVANCED .7 DNX12 (GAUZE/BANDAGES/DRESSINGS) ×1 IMPLANT
DRAPE EXTREMITY T 121X128X90 (DRAPE) IMPLANT
DRAPE PED LAPAROTOMY (DRAPES) ×2 IMPLANT
DRAPE UTILITY XL STRL (DRAPES) ×2 IMPLANT
DRSG TEGADERM 4X4.75 (GAUZE/BANDAGES/DRESSINGS) IMPLANT
ELECT REM PT RETURN 9FT ADLT (ELECTROSURGICAL) ×2
ELECTRODE REM PT RTRN 9FT ADLT (ELECTROSURGICAL) ×1 IMPLANT
GAUZE SPONGE 4X4 12PLY STRL LF (GAUZE/BANDAGES/DRESSINGS) IMPLANT
GAUZE SPONGE 4X4 16PLY XRAY LF (GAUZE/BANDAGES/DRESSINGS) IMPLANT
GLOVE EUDERMIC 7 POWDERFREE (GLOVE) ×2 IMPLANT
GLOVE SKINSENSE NS SZ7.0 (GLOVE) ×1
GLOVE SKINSENSE STRL SZ7.0 (GLOVE) ×1 IMPLANT
GOWN PREVENTION PLUS XLARGE (GOWN DISPOSABLE) ×4 IMPLANT
NEEDLE HYPO 25X1 1.5 SAFETY (NEEDLE) ×2 IMPLANT
NS IRRIG 1000ML POUR BTL (IV SOLUTION) IMPLANT
PACK BASIN DAY SURGERY FS (CUSTOM PROCEDURE TRAY) ×2 IMPLANT
PEN SKIN MARKING BROAD TIP (MISCELLANEOUS) ×2 IMPLANT
PENCIL BUTTON HOLSTER BLD 10FT (ELECTRODE) ×2 IMPLANT
SPONGE LAP 4X18 X RAY DECT (DISPOSABLE) IMPLANT
STAPLER VISISTAT 35W (STAPLE) IMPLANT
SUT MNCRL AB 3-0 PS2 18 (SUTURE) ×2 IMPLANT
SUT MNCRL AB 4-0 PS2 18 (SUTURE) IMPLANT
SUT VICRYL 3-0 CR8 SH (SUTURE) ×2 IMPLANT
SYR CONTROL 10ML LL (SYRINGE) ×2 IMPLANT
TOWEL OR 17X24 6PK STRL BLUE (TOWEL DISPOSABLE) ×2 IMPLANT
TOWEL OR NON WOVEN STRL DISP B (DISPOSABLE) ×2 IMPLANT
TUBE CONNECTING 20X1/4 (TUBING) IMPLANT
WATER STERILE IRR 1000ML POUR (IV SOLUTION) IMPLANT
YANKAUER SUCT BULB TIP NO VENT (SUCTIONS) IMPLANT

## 2011-09-05 NOTE — H&P (Signed)
Danny [name] is an 75 y.o. male.   Chief Complaint: lipoma HPI: Danny [name] is a 75 y.o. male who presents with He had a small mass of the back noted incidentally when he was having some physical therapy for his back. He showed this to his orthopedist, Dr.Sypher, who thought it was likely a lipoma but ask the patient to see [place].  It was asymptomatic. He was unaware of it prior to a couple of months ago when it was first noticed. It has gotten slightly larger and it has started to bother him some when he is lying down   Past Medical History  Diagnosis Date  . Diverticulosis   . Hepatitis, chronic     clinical grounds/no bx in the past  . Sixth nerve palsy 2001  . Impaired fasting glucose   . Hemorrhoids   . Allergic rhinitis   . Detached vitreous humor   . Lumbar radiculopathy   . BPH (benign prostatic hypertrophy)     Dr. Laverle Patter  . Ingrown toenail   . Rotator cuff disorder   . Onychomycosis   . Arthritis   . Heart murmur   . Coronary artery disease     Past Surgical History  Procedure Date  . Abnormal stress test   . Spine surgery 07/06/10 and 09/07/09    spinal fusion  . Rotator cuff repair 07/17/2007    left  . Hernia repair 1996    RIH  . Hemorrhoid surgery 1965  . Appendectomy 1957  . Tonsillectomy and adenoidectomy 1947  . Back surgery 2010,2011    lumbar fusion-  . Cardiac catheterization     Family History  Problem Relation Age of Onset  . Cancer Mother     multiple myeloma  . Stroke Father    Social History:  reports that he has never smoked. He has never used smokeless tobacco. He reports that he drinks alcohol. He reports that he does not use illicit drugs.  Allergies: No Known Allergies  Medications Prior to Admission  Medication Dose Route Frequency Provider Last Rate Last Dose  . fentaNYL (SUBLIMAZE) injection 50-100 mcg  50-100 mcg Intravenous PRN Constance Goltz, MD      . lactated ringers infusion   Intravenous Continuous Germaine Pomfret, MD 20 mL/hr at 09/05/11 1138    . midazolam (VERSED) injection 0.5-2 mg  0.5-2 mg Intravenous PRN Constance Goltz, MD       Medications Prior to Admission  Medication Sig Dispense Refill  . aspirin 81 MG tablet Take 81 mg by mouth daily.        . finasteride (PROSCAR) 5 MG tablet Take 5 mg by mouth daily.        . Glucosamine 750 MG TABS Take by mouth as needed.       [name] ibuprofen (ADVIL,MOTRIN) 800 MG tablet Take 800 mg by mouth 3 (three) times daily.        . Multiple Vitamin (MULTIVITAMIN) tablet Take 1 tablet by mouth daily.        . nitroGLYCERIN (NITROSTAT) 0.4 MG SL tablet Place 1 tablet (0.4 mg total) under the tongue every 5 (five) minutes as needed for chest pain.  90 tablet  3  . omeprazole (PRILOSEC) 20 MG capsule Take 20 mg by mouth daily.        . pregabalin (LYRICA) 100 MG capsule Take 100 mg by mouth daily.         No results found for this  or any previous visit (from the past 48 hour(s)). No results found.  ROS  Blood pressure 121/83, pulse 71, temperature 98 F (36.7 C), temperature source Oral, resp. rate 20, height 5\' 11"  (1.803 m), weight 170 lb (77.111 kg), SpO2 97.00%. Physical Exam  Chief complaint: Lipoma  History of present illness: I saw this patient in September with a small lipoma of the back near the bottom of his left scapula. He really would like to have this removed although it remains essentially asymptomatic. We had initially talked about doing it at the same time he would be having shoulder surgery, but his shoulder is now better and he will not need shoulder surgery.  Past history, family history, and review of systems are unchanged from his prior visit in September.  Physical exam:  GENERAL:  The patient is alert, oriented, and generally healthy-appearing, NAD. Mood and affect are normal.  HEENT:  The head is normocephalic, the eyes nonicteric, the pupils were round regular and equal. EOMs are normal. Pharynx normal.  Dentition good.  NECK:  The neck is supple and there are no masses or thyromegaly.  LUNGS:  Normal respirations and clear to auscultation.  HEART:  Regular rhythm, with no murmurs rubs or gallops. Pulses are intact carotid dorsalis pedis and posterior tibial. No significant varicosities are noted.  ABDOMEN:  Soft, flat, and nontender. No masses or organomegaly is noted.Small UH noted. Diastasis noted Bowel sounds are normal.  EXTREMITIES:  Good range of motion, no edema.  BACK:  4 cm lipoma at bottom of L scapula   Assessment/Plan Slowly enlarging lipoma - will plan excision  Sandy Blouch J 09/05/2011, 1:29 PM

## 2011-09-05 NOTE — Op Note (Signed)
Danny [name] August 04, 1936 [id number] 07/31/2011  Preoperative diagnosis: lipoma back, left scapular area  Postoperative diagnosis: the same  Procedure: excision of lipoma of the back, 4 cm  Surgeon: Currie Paris, MD, FACS  Assistant: none  Anesthesia: General   Clinical History and Indications: this patient has a minimally symptomatic lipomatous type mass in the left back the knee through around the scapular area. It seems to have gotten a little bit larger and has started to bother him when he sleeping and lying on it. He wished to have this excised.    Description of Procedure: I saw the patient holding area and confirmed the plans with the patient. I marked the lipoma. The patient was taken to the operating room and after satisfactory general endotracheal anesthesia had been obtained was placed in table prone with appropriate padding. The area over the left upper back was prepped and draped in a time out was done.  I made a transverse incision directly over the mass and divided the subcutaneous tissue. It seemed to be directly under Scarpa's fascia. Once I opened that I could clearly see the lipoma and it came out easily with no bleeding. It was excised intact and in toto.  I injected 20 cc of 0.25% plain Marcaine for postop pain relief. I made sure everything was dry. I tacked to the skin flaps down to the fascia with some 4-0 Vicryl and then closed subcutaneous tissue with 4-0 Vicryl. The skin was closed with 3-0 Monocryl subcuticular and Dermabond.  The patient tolerated the procedure well. There were no complications. All counts were correct.  Currie Paris, MD, FACS 09/05/2011 2:14 PM

## 2011-09-05 NOTE — Anesthesia Postprocedure Evaluation (Signed)
  Anesthesia Post Note  Patient: Danny [name]  Procedure(s) Performed:  EXCISION LIPOMA - removal of 4 cm lipoma of back  Anesthesia type: General  Patient location: PACU  Post pain: Pain level controlled  Post assessment: Patient's Cardiovascular Status Stable  Last Vitals:  Filed Vitals:   09/05/11 1515  BP:   Pulse: 68  Temp: 36.5 C  Resp: 16    Post vital signs: Reviewed and stable  Level of consciousness: alert  Complications: No apparent anesthesia complications

## 2011-09-05 NOTE — Transfer of Care (Signed)
Immediate Anesthesia Transfer of Care Note  Patient: Danny [name]  Procedure(s) Performed:  EXCISION LIPOMA - removal of 4 cm lipoma of back  Patient Location: PACU  Anesthesia Type: General  Level of Consciousness: awake and alert   Airway & Oxygen Therapy: Patient Spontanous Breathing and Patient connected to face mask oxygen  Post-op Assessment: Report given to PACU RN and Post -op Vital signs reviewed and stable  Post vital signs: Reviewed and stable  Complications: No apparent anesthesia complications

## 2011-09-05 NOTE — Anesthesia Preprocedure Evaluation (Signed)
Anesthesia Evaluation  Patient identified by MRN, date of birth, ID band Patient awake    Reviewed: Allergy & Precautions, H&P , NPO status , Patient's Chart, lab work & pertinent test results, reviewed documented beta blocker date and time   Airway Mallampati: II TM Distance: >3 FB Neck ROM: full    Dental   Pulmonary shortness of breath and with exertion,          Cardiovascular + CAD     Neuro/Psych  Neuromuscular disease Negative Psych ROS   GI/Hepatic negative GI ROS, Neg liver ROS,   Endo/Other  Negative Endocrine ROS  Renal/GU negative Renal ROS  Genitourinary negative   Musculoskeletal   Abdominal   Peds  Hematology negative hematology ROS (+)   Anesthesia Other Findings See surgeon's H&P   Reproductive/Obstetrics negative OB ROS                           Anesthesia Physical Anesthesia Plan  ASA: III  Anesthesia Plan: General   Post-op Pain Management:    Induction: Intravenous  Airway Management Planned: Oral ETT  Additional Equipment:   Intra-op Plan:   Post-operative Plan: Extubation in OR  Informed Consent: I have reviewed the patients History and Physical, chart, labs and discussed the procedure including the risks, benefits and alternatives for the proposed anesthesia with the patient or authorized representative who has indicated his/her understanding and acceptance.     Plan Discussed with: CRNA and Surgeon  Anesthesia Plan Comments:         Anesthesia Quick Evaluation

## 2011-09-05 NOTE — Anesthesia Procedure Notes (Addendum)
Procedure Name: Intubation Date/Time: 09/05/2011 1:37 PM Performed by: Gladys Damme Pre-anesthesia Checklist: Patient identified, Timeout performed, Emergency Drugs available, Suction available and Patient being monitored Patient Re-evaluated:Patient Re-evaluated prior to inductionOxygen Delivery Method: Circle System Utilized Preoxygenation: Pre-oxygenation with 100% oxygen Intubation Type: IV induction Ventilation: Mask ventilation without difficulty Laryngoscope Size: Miller and 2 Grade View: Grade I Tube type: Oral Number of attempts: 1 Placement Confirmation: ETT inserted through vocal cords under direct vision,  breath sounds checked- equal and bilateral and positive ETCO2 Secured at: 22 cm Tube secured with: Tape Dental Injury: Teeth and Oropharynx as per pre-operative assessment

## 2011-09-06 LAB — POCT HEMOGLOBIN-HEMACUE: Hemoglobin: 14.9 g/dL (ref 13.0–17.0)

## 2011-09-07 ENCOUNTER — Telehealth (INDEPENDENT_AMBULATORY_CARE_PROVIDER_SITE_OTHER): Payer: Self-pay | Admitting: General Surgery

## 2011-09-07 ENCOUNTER — Encounter (HOSPITAL_BASED_OUTPATIENT_CLINIC_OR_DEPARTMENT_OTHER): Payer: Self-pay | Admitting: Surgery

## 2011-09-07 NOTE — Telephone Encounter (Signed)
Patient made aware of path results. Will follow up at appt and call with any questions prior.  

## 2011-09-07 NOTE — Telephone Encounter (Signed)
Message copied by Liliana Cline on Fri Sep 07, 2011  9:18 AM ------      Message from: Currie Paris      Created: Thu Sep 06, 2011  5:50 PM       Tell her path is benign and as expected

## 2011-09-25 ENCOUNTER — Encounter (INDEPENDENT_AMBULATORY_CARE_PROVIDER_SITE_OTHER): Payer: Self-pay | Admitting: Surgery

## 2011-09-25 ENCOUNTER — Ambulatory Visit (INDEPENDENT_AMBULATORY_CARE_PROVIDER_SITE_OTHER): Payer: Medicare Other | Admitting: Surgery

## 2011-09-25 VITALS — BP 124/80 | HR 68 | Temp 97.6°F | Resp 18 | Ht 71.0 in | Wt 174.0 lb

## 2011-09-25 DIAGNOSIS — M545 Low back pain: Secondary | ICD-10-CM | POA: Diagnosis not present

## 2011-09-25 DIAGNOSIS — M25519 Pain in unspecified shoulder: Secondary | ICD-10-CM | POA: Diagnosis not present

## 2011-09-25 DIAGNOSIS — Z09 Encounter for follow-up examination after completed treatment for conditions other than malignant neoplasm: Secondary | ICD-10-CM

## 2011-09-25 NOTE — Progress Notes (Signed)
NAME: Danny [name]                                            DOB: [birth year] DATE: 09/25/2011                                                  MRN: [id number]  CC: Post op   HPI: This patient comes in for post op follow-up.Heunderwent removal of lipoma of shoulder on 09/05/2011. He feels that he is doing well.  PE: General: The patient appears to be healthy, NAD Incision healing nicley  DATA REVIEWED: Path c/w lilpoma  IMPRESSION: The patient is doing well S/P lipoma excision.    PLAN: RTC PRN Gave him a copy of path report

## 2011-09-25 NOTE — Patient Instructions (Signed)
We will see you again on an as needed basis. Please call the office at [phone] if you have any questions or concerns. Thank you for allowing us to take care of you.  

## 2011-09-28 DIAGNOSIS — M545 Low back pain: Secondary | ICD-10-CM | POA: Diagnosis not present

## 2011-09-28 DIAGNOSIS — M25519 Pain in unspecified shoulder: Secondary | ICD-10-CM | POA: Diagnosis not present

## 2011-10-02 DIAGNOSIS — M545 Low back pain: Secondary | ICD-10-CM | POA: Diagnosis not present

## 2011-10-02 DIAGNOSIS — M25519 Pain in unspecified shoulder: Secondary | ICD-10-CM | POA: Diagnosis not present

## 2011-11-05 DIAGNOSIS — H1130 Conjunctival hemorrhage, unspecified eye: Secondary | ICD-10-CM | POA: Diagnosis not present

## 2011-11-07 DIAGNOSIS — M25519 Pain in unspecified shoulder: Secondary | ICD-10-CM | POA: Diagnosis not present

## 2011-11-07 DIAGNOSIS — M545 Low back pain: Secondary | ICD-10-CM | POA: Diagnosis not present

## 2011-11-09 DIAGNOSIS — M545 Low back pain: Secondary | ICD-10-CM | POA: Diagnosis not present

## 2011-11-09 DIAGNOSIS — M25519 Pain in unspecified shoulder: Secondary | ICD-10-CM | POA: Diagnosis not present

## 2011-11-14 ENCOUNTER — Other Ambulatory Visit: Payer: Self-pay | Admitting: *Deleted

## 2011-11-14 DIAGNOSIS — H259 Unspecified age-related cataract: Secondary | ICD-10-CM | POA: Diagnosis not present

## 2011-11-14 DIAGNOSIS — R9430 Abnormal result of cardiovascular function study, unspecified: Secondary | ICD-10-CM

## 2011-11-14 DIAGNOSIS — H1130 Conjunctival hemorrhage, unspecified eye: Secondary | ICD-10-CM | POA: Diagnosis not present

## 2011-11-14 DIAGNOSIS — H04129 Dry eye syndrome of unspecified lacrimal gland: Secondary | ICD-10-CM | POA: Diagnosis not present

## 2011-11-14 DIAGNOSIS — R0602 Shortness of breath: Secondary | ICD-10-CM

## 2011-11-14 MED ORDER — ATORVASTATIN CALCIUM 20 MG PO TABS: 20.0000 mg | ORAL_TABLET | Freq: Every day | ORAL | Status: DC

## 2012-03-11 ENCOUNTER — Encounter: Payer: Self-pay | Admitting: Cardiology

## 2012-03-11 DIAGNOSIS — Z79899 Other long term (current) drug therapy: Secondary | ICD-10-CM | POA: Diagnosis not present

## 2012-03-11 DIAGNOSIS — M25549 Pain in joints of unspecified hand: Secondary | ICD-10-CM | POA: Diagnosis not present

## 2012-03-11 DIAGNOSIS — M48061 Spinal stenosis, lumbar region without neurogenic claudication: Secondary | ICD-10-CM | POA: Diagnosis not present

## 2012-03-11 DIAGNOSIS — I2510 Atherosclerotic heart disease of native coronary artery without angina pectoris: Secondary | ICD-10-CM | POA: Diagnosis not present

## 2012-03-11 DIAGNOSIS — M5137 Other intervertebral disc degeneration, lumbosacral region: Secondary | ICD-10-CM | POA: Diagnosis not present

## 2012-03-12 DIAGNOSIS — H259 Unspecified age-related cataract: Secondary | ICD-10-CM | POA: Diagnosis not present

## 2012-03-12 DIAGNOSIS — H5310 Unspecified subjective visual disturbances: Secondary | ICD-10-CM | POA: Diagnosis not present

## 2012-03-12 DIAGNOSIS — H43819 Vitreous degeneration, unspecified eye: Secondary | ICD-10-CM | POA: Diagnosis not present

## 2012-04-16 DIAGNOSIS — H04129 Dry eye syndrome of unspecified lacrimal gland: Secondary | ICD-10-CM | POA: Diagnosis not present

## 2012-04-16 DIAGNOSIS — H43819 Vitreous degeneration, unspecified eye: Secondary | ICD-10-CM | POA: Diagnosis not present

## 2012-04-16 DIAGNOSIS — H16109 Unspecified superficial keratitis, unspecified eye: Secondary | ICD-10-CM | POA: Diagnosis not present

## 2012-04-16 DIAGNOSIS — H259 Unspecified age-related cataract: Secondary | ICD-10-CM | POA: Diagnosis not present

## 2012-07-29 DIAGNOSIS — N400 Enlarged prostate without lower urinary tract symptoms: Secondary | ICD-10-CM | POA: Diagnosis not present

## 2012-07-29 DIAGNOSIS — Z125 Encounter for screening for malignant neoplasm of prostate: Secondary | ICD-10-CM | POA: Diagnosis not present

## 2012-08-20 ENCOUNTER — Other Ambulatory Visit: Payer: Self-pay | Admitting: Dermatology

## 2012-08-20 DIAGNOSIS — L820 Inflamed seborrheic keratosis: Secondary | ICD-10-CM | POA: Diagnosis not present

## 2012-08-20 DIAGNOSIS — C4360 Malignant melanoma of unspecified upper limb, including shoulder: Secondary | ICD-10-CM | POA: Diagnosis not present

## 2012-08-20 DIAGNOSIS — D485 Neoplasm of uncertain behavior of skin: Secondary | ICD-10-CM | POA: Diagnosis not present

## 2012-09-02 ENCOUNTER — Encounter: Payer: Self-pay | Admitting: Cardiology

## 2012-09-02 DIAGNOSIS — Z1331 Encounter for screening for depression: Secondary | ICD-10-CM | POA: Diagnosis not present

## 2012-09-02 DIAGNOSIS — I2510 Atherosclerotic heart disease of native coronary artery without angina pectoris: Secondary | ICD-10-CM | POA: Diagnosis not present

## 2012-09-02 DIAGNOSIS — Z0000 Encounter for general adult medical examination without abnormal findings: Secondary | ICD-10-CM | POA: Diagnosis not present

## 2012-09-02 DIAGNOSIS — Z79899 Other long term (current) drug therapy: Secondary | ICD-10-CM | POA: Diagnosis not present

## 2012-09-16 DIAGNOSIS — L821 Other seborrheic keratosis: Secondary | ICD-10-CM | POA: Diagnosis not present

## 2012-09-16 DIAGNOSIS — D485 Neoplasm of uncertain behavior of skin: Secondary | ICD-10-CM | POA: Diagnosis not present

## 2012-09-16 DIAGNOSIS — C4360 Malignant melanoma of unspecified upper limb, including shoulder: Secondary | ICD-10-CM | POA: Diagnosis not present

## 2012-10-24 DIAGNOSIS — H04129 Dry eye syndrome of unspecified lacrimal gland: Secondary | ICD-10-CM | POA: Diagnosis not present

## 2012-10-24 DIAGNOSIS — H259 Unspecified age-related cataract: Secondary | ICD-10-CM | POA: Diagnosis not present

## 2012-10-24 DIAGNOSIS — H16109 Unspecified superficial keratitis, unspecified eye: Secondary | ICD-10-CM | POA: Diagnosis not present

## 2012-10-24 DIAGNOSIS — H52209 Unspecified astigmatism, unspecified eye: Secondary | ICD-10-CM | POA: Diagnosis not present

## 2012-11-06 DIAGNOSIS — Z85820 Personal history of malignant melanoma of skin: Secondary | ICD-10-CM | POA: Diagnosis not present

## 2012-11-06 DIAGNOSIS — D235 Other benign neoplasm of skin of trunk: Secondary | ICD-10-CM | POA: Diagnosis not present

## 2012-12-15 ENCOUNTER — Other Ambulatory Visit: Payer: Self-pay | Admitting: *Deleted

## 2012-12-15 DIAGNOSIS — R9430 Abnormal result of cardiovascular function study, unspecified: Secondary | ICD-10-CM

## 2012-12-15 DIAGNOSIS — R0602 Shortness of breath: Secondary | ICD-10-CM

## 2012-12-15 MED ORDER — ATORVASTATIN CALCIUM 20 MG PO TABS: 20.0000 mg | ORAL_TABLET | Freq: Every day | ORAL | Status: DC

## 2013-02-04 ENCOUNTER — Other Ambulatory Visit: Payer: Self-pay | Admitting: Dermatology

## 2013-02-04 DIAGNOSIS — L820 Inflamed seborrheic keratosis: Secondary | ICD-10-CM | POA: Diagnosis not present

## 2013-02-04 DIAGNOSIS — D485 Neoplasm of uncertain behavior of skin: Secondary | ICD-10-CM | POA: Diagnosis not present

## 2013-04-20 DIAGNOSIS — M79609 Pain in unspecified limb: Secondary | ICD-10-CM | POA: Diagnosis not present

## 2013-04-24 DIAGNOSIS — R51 Headache: Secondary | ICD-10-CM | POA: Diagnosis not present

## 2013-04-24 DIAGNOSIS — H259 Unspecified age-related cataract: Secondary | ICD-10-CM | POA: Diagnosis not present

## 2013-04-24 DIAGNOSIS — H16109 Unspecified superficial keratitis, unspecified eye: Secondary | ICD-10-CM | POA: Diagnosis not present

## 2013-04-24 DIAGNOSIS — H5710 Ocular pain, unspecified eye: Secondary | ICD-10-CM | POA: Diagnosis not present

## 2013-04-28 ENCOUNTER — Encounter: Payer: Self-pay | Admitting: Neurology

## 2013-04-28 ENCOUNTER — Ambulatory Visit (INDEPENDENT_AMBULATORY_CARE_PROVIDER_SITE_OTHER): Payer: Medicare Other | Admitting: Neurology

## 2013-04-28 VITALS — BP 119/75 | HR 74 | Ht 69.0 in | Wt 169.0 lb

## 2013-04-28 DIAGNOSIS — I2510 Atherosclerotic heart disease of native coronary artery without angina pectoris: Secondary | ICD-10-CM | POA: Diagnosis not present

## 2013-04-28 DIAGNOSIS — M479 Spondylosis, unspecified: Secondary | ICD-10-CM

## 2013-04-28 DIAGNOSIS — M129 Arthropathy, unspecified: Secondary | ICD-10-CM | POA: Diagnosis not present

## 2013-04-28 DIAGNOSIS — K739 Chronic hepatitis, unspecified: Secondary | ICD-10-CM | POA: Diagnosis not present

## 2013-04-28 DIAGNOSIS — M542 Cervicalgia: Secondary | ICD-10-CM

## 2013-04-28 DIAGNOSIS — R200 Anesthesia of skin: Secondary | ICD-10-CM

## 2013-04-28 DIAGNOSIS — M6250 Muscle wasting and atrophy, not elsewhere classified, unspecified site: Secondary | ICD-10-CM

## 2013-04-28 DIAGNOSIS — R209 Unspecified disturbances of skin sensation: Secondary | ICD-10-CM

## 2013-04-28 DIAGNOSIS — H7290 Unspecified perforation of tympanic membrane, unspecified ear: Secondary | ICD-10-CM

## 2013-04-28 DIAGNOSIS — Z8739 Personal history of other diseases of the musculoskeletal system and connective tissue: Secondary | ICD-10-CM

## 2013-04-28 DIAGNOSIS — H9319 Tinnitus, unspecified ear: Secondary | ICD-10-CM | POA: Diagnosis not present

## 2013-04-28 DIAGNOSIS — H7292 Unspecified perforation of tympanic membrane, left ear: Secondary | ICD-10-CM

## 2013-04-28 DIAGNOSIS — M5090 Cervical disc disorder, unspecified, unspecified cervical region: Secondary | ICD-10-CM | POA: Diagnosis not present

## 2013-04-28 DIAGNOSIS — R7301 Impaired fasting glucose: Secondary | ICD-10-CM | POA: Diagnosis not present

## 2013-04-28 DIAGNOSIS — N400 Enlarged prostate without lower urinary tract symptoms: Secondary | ICD-10-CM | POA: Diagnosis not present

## 2013-04-28 DIAGNOSIS — H9312 Tinnitus, left ear: Secondary | ICD-10-CM

## 2013-04-28 DIAGNOSIS — M62542 Muscle wasting and atrophy, not elsewhere classified, left hand: Secondary | ICD-10-CM

## 2013-04-28 DIAGNOSIS — H4920 Sixth [abducent] nerve palsy, unspecified eye: Secondary | ICD-10-CM | POA: Diagnosis not present

## 2013-04-28 DIAGNOSIS — IMO0002 Reserved for concepts with insufficient information to code with codable children: Secondary | ICD-10-CM | POA: Diagnosis not present

## 2013-04-28 NOTE — Patient Instructions (Addendum)
I think overall you are doing fairly well but I do want to suggest a few things today:  Keep your appt. With ENT  As far as your medications are concerned, I would like to suggest no changes today.   As far as diagnostic testing: EMG, muscle electrical testing, C spine MRI, and blood work (today).  I would like to see you back in 3 months, sooner if we need to. Please call us with any interim questions, concerns, problems, updates or refill requests.  Please also call us for any test results so we can go over those with you on the phone. Brett Canales is my clinical assistant and will answer any of your questions and relay your messages to me and also relay most of my messages to you.  Our phone number is [phone]. We also have an after hours call service for urgent matters and there is a physician on-call for urgent questions. For any emergencies you know to call 911 or go to the nearest emergency room.

## 2013-04-28 NOTE — Progress Notes (Signed)
Subjective:    Patient ID: Danny [name] is a 77 y.o. male.  HPI  Huston Foley, MD, PhD [facility] [address] [address] [place], [place] [address]  Dear Dr. Teressa Senter,   I saw your patient, Danny [name], upon your kind request in my neurologic clinic today for initial consultation of his left upper extremity numbness. The patient is accompanied by his wife, Danny [name], today. As you know, this to [name] is a very pleasant 77 year old right-handed gentleman with an underlying medical history of CAD, GER, OA, s/p back surgery in 2010 and 2011, and HLP, who has been experiencing persistent numbness and tingling in his left upper extremity for the past 1+ year. This is more pronounced at night and he exhibited symptoms of median neuropathy however recent nerve conduction studies in your office showed no evidence of left or right median nerve problems or ulnar neuropathy at the elbow. He has been experiencing a buzzing sound in his left ear for the past year and is scheduled to see an ear nose throat doctor at [facility]  this month. Nothing helps or affects the buzzing sound in his ear. He previously had left rotator cuff problems and had surgery in 2008, and after that he had a muscle tear, but this was not repaired. His Sx are primarily in the hand on the L and forearm. Sx are plateaued and are mildly bothersome. More bothersome is the ringing in his ear. He will see a new ENT MD on Monday.     His Past Medical History Is Significant For: Past Medical History  Diagnosis Date  . Diverticulosis   . Hepatitis, chronic     clinical grounds/no bx in the past  . Sixth nerve palsy 2001  . Impaired fasting glucose   . Hemorrhoids   . Allergic rhinitis   . Detached vitreous humor   . Lumbar radiculopathy   . BPH (benign prostatic hypertrophy)     Dr. Laverle Patter  . Ingrown toenail   . Rotator cuff disorder   . Onychomycosis   . Arthritis   . Heart murmur    . Coronary artery disease     His Past Surgical History Is Significant For: Past Surgical History  Procedure Laterality Date  . Abnormal stress test    . Spine surgery  07/06/10 and 09/07/09    spinal fusion  . Rotator cuff repair  07/17/2007    left  . Hernia repair  1996    RIH  . Hemorrhoid surgery  1965  . Appendectomy  1957  . Tonsillectomy and adenoidectomy  1947  . Back surgery  2010,2011    lumbar fusion-  . Cardiac catheterization    . Lipoma excision  09/05/2011    Procedure: EXCISION LIPOMA;  Surgeon: Currie Paris, MD;  Location: [facility];  Service: General;  Laterality: Left;  removal of 4 cm lipoma of back    His Family History Is Significant For: Family History  Problem Relation Age of Onset  . Cancer Mother     multiple myeloma  . Stroke Father     His Social History Is Significant For: History   Social History  . Marital Status: Married    Spouse Name: [name]    Number of Children: [name]  . Years of Education: [name]   Occupational History  . retired     Counsellor in [employer]   Social History Main Topics  . Smoking  status: Never Smoker   . Smokeless tobacco: Never Used  . Alcohol Use: Yes  . Drug Use: No  . Sexually Active: None   Other Topics Concern  . None   Social History Narrative  . None    His Allergies Are:  No Known Allergies:   His Current Medications Are:  Outpatient Encounter Prescriptions as of 04/28/2013  Medication Sig Dispense Refill  . aspirin 81 MG tablet Take 81 mg by mouth daily.        [name] atorvastatin (LIPITOR) 20 MG tablet Take 1 tablet (20 mg total) by mouth daily.  30 tablet  0  . Coenzyme Q10 (COQ10) 200 MG CAPS Take by mouth daily.        . finasteride (PROSCAR) 5 MG tablet Take 5 mg by mouth daily.        . Glucosamine 750 MG TABS Take by mouth as needed.       [name] ibuprofen (ADVIL,MOTRIN) 800 MG tablet Take 800 mg by mouth 3 (three) times daily.        . Multiple Vitamin  (MULTIVITAMIN) tablet Take 1 tablet by mouth daily.        [name] omeprazole (PRILOSEC) 20 MG capsule Take 20 mg by mouth daily.        . pregabalin (LYRICA) 100 MG capsule Take 100 mg by mouth daily.       . RESTASIS 0.05 % ophthalmic emulsion       . nitroGLYCERIN (NITROSTAT) 0.4 MG SL tablet Place 1 tablet (0.4 mg total) under the tongue every 5 (five) minutes as needed for chest pain.  90 tablet  3   No facility-administered encounter medications on file as of 04/28/2013.   Review of Systems  Constitutional: Positive for fatigue.  HENT: Positive for hearing loss.        Ringing in ear  Gastrointestinal: Positive for constipation.  Musculoskeletal:       Joint Pain  Skin:       Mole  Neurological: Positive for dizziness, numbness and headaches.       Memory Loss  Psychiatric/Behavioral: Positive for sleep disturbance.       Depression, Decreased Energy, Disinterest in activity, Restless leg    Objective:  Neurologic Exam  Physical Exam Physical Examination:   Filed Vitals:   04/28/13 0823  BP: 119/75  Pulse: 74    General Examination: The patient is a very pleasant 77 y.o. male in no acute distress. He appears well-developed and well-nourished and very well groomed.   HEENT: Normocephalic, atraumatic, pupils are equal, round and reactive to light and accommodation. Funduscopic exam is normal with sharp disc margins noted. Extraocular tracking is good without limitation to gaze excursion or nystagmus noted. Normal smooth pursuit is noted. Hearing is grossly intact. Tympanic membranes are clear on the R and on the L there is a small damage to the ear drum, no cerumen, no drainage, no blood. Face is symmetric with normal facial animation and normal facial sensation. Speech is clear with no dysarthria noted. There is no hypophonia. There is no lip, neck/head, jaw or voice tremor. Neck is supple with full range of passive and active motion. There are no carotid bruits on auscultation.  Oropharynx exam reveals: mild mouth dryness, adequate dental hygiene and mild airway crowding. Mallampati is class II. Tongue protrudes centrally and palate elevates symmetrically.    Chest: Clear to auscultation without wheezing, rhonchi or crackles noted.  Heart: S1+S2+0, regular and normal without murmurs, rubs or  gallops noted.   Abdomen: Soft, non-tender and non-distended with normal bowel sounds appreciated on auscultation.  Extremities: There is no pitting edema in the distal lower extremities bilaterally. Pedal pulses are intact.  Skin: Warm and dry without trophic changes noted. There are no varicose veins.  Musculoskeletal: exam reveals mild joint swelling in his proximal finger joints.   Neurologically:  Mental status: The patient is awake, alert and oriented in all 4 spheres. His memory, attention, language and knowledge are appropriate. There is no aphasia, agnosia, apraxia or anomia. Speech is clear with normal prosody and enunciation. Thought process is linear. Mood is congruent and affect is normal.  Cranial nerves are as described above under HEENT exam. In addition, shoulder shrug is normal with equal shoulder height noted. Motor exam: Normal bulk, strength and tone is noted. There is no drift, tremor or rebound. Romberg is negative. Reflexes are 2+ throughout. Toes are downgoing bilaterally. Fine motor skills are intact with normal finger taps, normal hand movements, normal rapid alternating patting, normal foot taps and normal foot agility.  Cerebellar testing shows no dysmetria or intention tremor on finger to nose testing. Heel to shin is unremarkable bilaterally. There is no truncal or gait ataxia.  Sensory exam is intact to light touch, pinprick, vibration, temperature sense and proprioception in the upper and lower extremities, however, he reports subjective numbness in his left hand and forearm as well as tingling in that area. This is primarily in the volar aspect. I do  notice a slight degree of muscle atrophy in his thenar profile and he has mild joint swelling in the proximal finger joints. This is bilateral. There is rheumatoid arthritis in his family of note. He has never been diagnosed with RA.  Gait, station and balance are unremarkable. No veering to one side is noted. No leaning to one side is noted. Posture is age-appropriate and stance is narrow based. No problems turning are noted. He turns en bloc. Tandem walk is unremarkable. Intact toe and heel stance is noted.               Assessment and Plan:    In summary, REIS GOGA is a very pleasant 77 y.o.-year old male with a history of left arm and hand numbness and tingling. On exam he has a slight degree of muscle wasting but this is bilateral in his hands. He has some joint related changes that or arthritic in appearance. He does seem to have a small defect in the left eardrum. It is important that he see ENT for this I believe. I do not think his left arm symptoms are related to his ear buzzing. He does have a history of degenerative spine disease and had lower back surgery twice. He had an MRI to his neck several years ago for neck pain. At this juncture I would like to proceed with EMG testing to the left arm, cervical spine MRI and blood work to include thyroid function, inflammatory and autoimmune markers as well as rheumatoid factor. I will see him back in 3 months, sooner if the need arises. A long chat with him and his wife today. She would like to pick up my office note so they have it ready when they see ENT next week. She is welcome to pick up the test results and my office note later in the week. I answered their questions today. We will call them with the test results as we get them back. As far as medications are  concerned, I recommended the following at this time: no change.  The patient and his wife were in agreement with the above outlined plan.  Thank you very much for allowing me to  participate in the care of this nice patient. If I can be of any further assistance to you please do not hesitate to call me at [phone].  Sincerely,   Huston Foley, MD, PhD

## 2013-04-29 LAB — COMPREHENSIVE METABOLIC PANEL
Albumin/Globulin Ratio: 2.2 (ref 1.1–2.5)
Albumin: 4.2 g/dL (ref 3.5–4.8)
Alkaline Phosphatase: 126 IU/L — ABNORMAL HIGH (ref 39–117)
BUN/Creatinine Ratio: 17 (ref 10–22)
BUN: 15 mg/dL (ref 8–27)
Creatinine, Ser: 0.89 mg/dL (ref 0.76–1.27)
GFR calc Af Amer: 97 mL/min/{1.73_m2} (ref >59)
GFR calc non Af Amer: 84 mL/min/{1.73_m2} (ref >59)
Globulin, Total: 1.9 g/dL (ref 1.5–4.5)
Total Bilirubin: 0.4 mg/dL (ref 0.0–1.2)

## 2013-04-29 LAB — RHEUMATOID FACTOR: Rhuematoid fact SerPl-aCnc: <7 IU/mL (ref 0.0–13.9)

## 2013-04-29 LAB — TSH

## 2013-04-29 LAB — HGB A1C W/O EAG: Hgb A1c MFr Bld: 6.3 % — ABNORMAL HIGH (ref 4.8–5.6)

## 2013-04-29 NOTE — Progress Notes (Signed)
Quick Note:  Please call patient back after his recent lab results are back: There are couple of smaller issues with his lab results, nothing to get worried about but his diabetes marker was slightly elevated indicating possibly a problem with prediabetes or diabetes. Also his TSH was slightly elevated at 5.3 with the normal cut off at 4.5, this may indicate a problem with his thyroid function, in particular low thyroid function. Also his liver enzymes are slightly up, again this is not a concerning finding but may indicate that his liver is processing multiple medications and this needs to be watched down the Road. I would recommend that he make an appointment with his primary care physician at the next possible opportunity and discuss thyroid function, blood glucose control, and liver function with him. We can fax labs to Dr. Earl Gala if he does not have access to [facility]. thx Huston Foley, MD, PhD [facility] ([facility]  ______

## 2013-04-30 ENCOUNTER — Encounter (INDEPENDENT_AMBULATORY_CARE_PROVIDER_SITE_OTHER): Payer: Medicare Other | Admitting: Radiology

## 2013-04-30 ENCOUNTER — Ambulatory Visit (INDEPENDENT_AMBULATORY_CARE_PROVIDER_SITE_OTHER): Payer: Medicare Other | Admitting: Neurology

## 2013-04-30 DIAGNOSIS — H9312 Tinnitus, left ear: Secondary | ICD-10-CM

## 2013-04-30 DIAGNOSIS — R202 Paresthesia of skin: Secondary | ICD-10-CM

## 2013-04-30 DIAGNOSIS — M479 Spondylosis, unspecified: Secondary | ICD-10-CM

## 2013-04-30 DIAGNOSIS — M62542 Muscle wasting and atrophy, not elsewhere classified, left hand: Secondary | ICD-10-CM

## 2013-04-30 DIAGNOSIS — R209 Unspecified disturbances of skin sensation: Secondary | ICD-10-CM | POA: Diagnosis not present

## 2013-04-30 DIAGNOSIS — H7292 Unspecified perforation of tympanic membrane, left ear: Secondary | ICD-10-CM

## 2013-04-30 DIAGNOSIS — Z0289 Encounter for other administrative examinations: Secondary | ICD-10-CM

## 2013-04-30 DIAGNOSIS — Z8739 Personal history of other diseases of the musculoskeletal system and connective tissue: Secondary | ICD-10-CM

## 2013-04-30 NOTE — Progress Notes (Signed)
Quick Note:  Please call and advise the patient that the recent EMG and nerve conduction velocity test, which is the electrical nerve and muscle test we we performed, was reported as within normal limits. We checked for abnormal electrical discharges in the muscles or nerves and the report suggested normal findings. No further action is required on this test at this time. Please remind patient to keep any upcoming appointments or tests and to call us with any interim questions, concerns, problems or updates. Thanks,  Tika Hannis, MD, PhD   ______ 

## 2013-04-30 NOTE — Procedures (Signed)
  HISTORY:  Danny [name] is a 77 year old gentleman with a history of left hand numbness that began following a shoulder procedure 18 months ago. The patient has had persistent numbness of the hand and distal forearm without discomfort. The patient reports no neck discomfort or pain radiating down the arm from the neck or shoulder. The patient reports no weakness. The patient is being evaluated for a neuropathy or a possible radiculopathy.  NERVE CONDUCTION STUDIES:  Nerve conduction studies were performed on the left upper extremity. The distal motor latencies and motor amplitudes for the median and ulnar nerves were within normal limits. The F wave latencies and nerve conduction velocities for these nerves were also normal. The sensory latencies for the median, radial, and ulnar nerves were normal.   EMG STUDIES:  EMG study was performed on the left upper extremity:  The first dorsal interosseous muscle reveals 2 to 4 K units with full recruitment. No fibrillations or positive waves were noted. The abductor pollicis brevis muscle reveals 2 to 5 K units with full recruitment. No fibrillations or positive waves were noted. The extensor indicis proprius muscle reveals 1 to 3 K units with full recruitment. No fibrillations or positive waves were noted. The pronator teres muscle reveals 2 to 3 K units with full recruitment. No fibrillations or positive waves were noted. The biceps muscle reveals 1 to 2 K units with full recruitment. No fibrillations or positive waves were noted. The triceps muscle reveals 2 to 4 K units with full recruitment. No fibrillations or positive waves were noted. The anterior deltoid muscle reveals 2 to 3 K units with full recruitment. No fibrillations or positive waves were noted. The cervical paraspinal muscles were tested at 2 levels. No abnormalities of insertional activity were seen at either level tested. There was fair relaxation.   IMPRESSION:  Nerve  conduction studies done on the left upper extremity were unremarkable. No evidence of a neuropathy is seen. EMG evaluation of the left upper extremity was unremarkable, without evidence of an overlying cervical radiculopathy. The etiology of the left hand numbness is not explained on this study.  Marlan Palau MD 04/30/2013 4:01 PM  [facility] [address] [place], [place] [address]  Phone [phone] Fax [phone]

## 2013-05-01 NOTE — Progress Notes (Signed)
Quick Note:  Spoke with patient and relayed results of blood work and EMG/NCV test. I am faxing the lab results to Dr Earl Gala as requested by the pt. The patient was also reminded of any future appointments. Patient understood and had no questions. ______

## 2013-05-01 NOTE — Progress Notes (Signed)
Quick Note:  Spoke with patient and relayed results of blood work and EMG/NCV test. I am faxing the lab results to Dr Osborne as requested by the pt. The patient was also reminded of any future appointments. Patient understood and had no questions. ______ 

## 2013-05-05 ENCOUNTER — Other Ambulatory Visit: Payer: Self-pay | Admitting: Dermatology

## 2013-05-05 DIAGNOSIS — C44319 Basal cell carcinoma of skin of other parts of face: Secondary | ICD-10-CM | POA: Diagnosis not present

## 2013-05-05 DIAGNOSIS — Z85820 Personal history of malignant melanoma of skin: Secondary | ICD-10-CM | POA: Diagnosis not present

## 2013-05-05 DIAGNOSIS — L538 Other specified erythematous conditions: Secondary | ICD-10-CM | POA: Diagnosis not present

## 2013-05-05 DIAGNOSIS — L820 Inflamed seborrheic keratosis: Secondary | ICD-10-CM | POA: Diagnosis not present

## 2013-05-05 DIAGNOSIS — D235 Other benign neoplasm of skin of trunk: Secondary | ICD-10-CM | POA: Diagnosis not present

## 2013-05-06 DIAGNOSIS — H9190 Unspecified hearing loss, unspecified ear: Secondary | ICD-10-CM | POA: Diagnosis not present

## 2013-05-06 DIAGNOSIS — H9319 Tinnitus, unspecified ear: Secondary | ICD-10-CM | POA: Diagnosis not present

## 2013-05-06 DIAGNOSIS — H905 Unspecified sensorineural hearing loss: Secondary | ICD-10-CM | POA: Diagnosis not present

## 2013-05-12 ENCOUNTER — Other Ambulatory Visit: Payer: Self-pay | Admitting: Neurology

## 2013-05-12 ENCOUNTER — Encounter: Payer: Self-pay | Admitting: Neurology

## 2013-05-12 DIAGNOSIS — M5090 Cervical disc disorder, unspecified, unspecified cervical region: Secondary | ICD-10-CM

## 2013-05-12 DIAGNOSIS — R209 Unspecified disturbances of skin sensation: Secondary | ICD-10-CM

## 2013-05-15 ENCOUNTER — Telehealth: Payer: Self-pay | Admitting: Cardiology

## 2013-05-15 NOTE — Telephone Encounter (Signed)
Pt rtn call [phone]

## 2013-05-15 NOTE — Telephone Encounter (Signed)
New Problem  Pt called problems with pain arms and fingers for several months// made appt 9/16.

## 2013-05-15 NOTE — Telephone Encounter (Signed)
LMTCB

## 2013-05-15 NOTE — Telephone Encounter (Signed)
Called patient's wife. He is complaining of left arm and finger numbness for several months. Had neuro work up and scheduled for a cervical spinal xray next week. Advised her that if work up is positive for cervical spinal disease causing numbness and tingling then she could cancell his PA appointment and schedule to see Dr.McLean in November for a 2 year ROV. If xray is negative she will keep the PA appointment.

## 2013-05-20 ENCOUNTER — Ambulatory Visit
Admission: RE | Admit: 2013-05-20 | Discharge: 2013-05-20 | Disposition: A | Discharge status: Home / Self Care | Payer: Medicare Other | Source: Ambulatory Visit | Attending: Neurology | Admitting: Neurology

## 2013-05-20 DIAGNOSIS — M5090 Cervical disc disorder, unspecified, unspecified cervical region: Secondary | ICD-10-CM | POA: Diagnosis not present

## 2013-05-20 DIAGNOSIS — R209 Unspecified disturbances of skin sensation: Secondary | ICD-10-CM | POA: Diagnosis not present

## 2013-05-22 ENCOUNTER — Telehealth: Payer: Self-pay | Admitting: *Deleted

## 2013-05-22 NOTE — Telephone Encounter (Signed)
Danny [name] is requesting a call back regarding her husband MRI Results. She states the patient is having new problems with headaches and thinks an appointment is in order. If we need to schedule please let us know. Please call patient

## 2013-05-22 NOTE — Telephone Encounter (Signed)
Spouse called to schedule an appt because patient is having frequent headaches and she would like to go over MRI results. Explained MRI result protocol and advised to contact PCP in reference to HA b/c this would be considered a new problem. Spouse agreed.

## 2013-05-26 ENCOUNTER — Inpatient Hospital Stay: Admission: RE | Admit: 2013-05-26 | Payer: Medicare Other | Source: Ambulatory Visit

## 2013-05-26 NOTE — Progress Notes (Signed)
Quick Note:  Please advise patient that his recent cervical spine MRI showed only minor degenerative changes. Certainly, no significant cord compression or pinched nerves. There further action is required in this test at this time. Please advise patient to keep any followup appointments or additional test appointments as previously scheduled. Thanks, Huston Foley, MD, PhD [facility] ([facility]) ______

## 2013-05-27 ENCOUNTER — Telehealth: Payer: Self-pay

## 2013-05-27 NOTE — Telephone Encounter (Signed)
I called patient and provided him with results of MRI. He is wondering what the next step is since he is having migraines over left eye and continued numbness of left arm. Patient also has buzzing in his ears and has been found to have a hearing loss. I asked if he had hearing aides. He stated he did not but that they were recommended. I let him know that they can add a sound to the hearing aid called a masker to cover up the buzzing sound. I referred him to [email or web address] and discussed how he could get to the link that had sounds of tinnitus so he could match up his sound with those on the web link so he would be best able to let the audiologist know what specific kind of sound he is hearing so they can better help mask it. Patient stated he was reluctant to get hearing aides but, now that he has spoken to me, he is going to follow through. I also gave him information on another link on this web site where he can mix different sounds that he can play while at home that will also help mask the buzzing sound he hears until he gets his hearing aides.

## 2013-05-27 NOTE — Telephone Encounter (Signed)
Message copied by [facility] on Wed May 27, 2013  9:03 AM ------      Message from: Huston Foley      Created: Tue May 26, 2013 10:33 AM       Please advise patient that his recent cervical spine MRI showed only minor degenerative changes. Certainly, no significant cord compression or pinched nerves. There further action is required in this test at this time. Please advise patient to keep any followup appointments or additional test appointments as previously scheduled. Thanks,      Huston Foley, MD, PhD      [facility]) ------

## 2013-05-29 ENCOUNTER — Telehealth: Payer: Self-pay | Admitting: Neurology

## 2013-05-29 ENCOUNTER — Telehealth: Payer: Self-pay

## 2013-05-29 NOTE — Telephone Encounter (Signed)
Message copied by [facility] on Fri May 29, 2013  2:51 PM ------      Message from: Huston Foley      Created: Tue May 26, 2013 10:33 AM       Please advise patient that his recent cervical spine MRI showed only minor degenerative changes. Certainly, no significant cord compression or pinched nerves. There further action is required in this test at this time. Please advise patient to keep any followup appointments or additional test appointments as previously scheduled. Thanks,      Huston Foley, MD, PhD      [facility]) ------

## 2013-05-29 NOTE — Telephone Encounter (Signed)
Dr. Frances Furbish,   Patient continues to have headache over left eye that is chronic, daily. This has been going on for over a year. Patient would like to know if he can get an MRI of brain to assure there is nothing ongoing with that. He does not have an appointment with an ENT for 6 months. Please advise.

## 2013-05-29 NOTE — Telephone Encounter (Signed)
I have entered a brain MRI request. I did not see him for HAs, but will order scan at their request. Pls advise patient, that we will be in touch to set it up and then we will call him with the test results. He was supposed to see ENT. Please enquire as to the outcome of that appt. Thx Huston Foley, MD, PhD [facility])

## 2013-05-29 NOTE — Telephone Encounter (Signed)
I called and let wife know MRI of brain has been ordered. ENT appointment not for 6 months.

## 2013-05-29 NOTE — Telephone Encounter (Signed)
I called and left VM to please call for results of MRI.   Also, please note, patient's wife had previously called requesting a new appointment for a new complaint. They had been referred to their primary doctor. Please check up on this when they call back.

## 2013-06-02 ENCOUNTER — Encounter: Payer: Self-pay | Admitting: Physician Assistant

## 2013-06-02 ENCOUNTER — Ambulatory Visit (INDEPENDENT_AMBULATORY_CARE_PROVIDER_SITE_OTHER): Payer: Medicare Other | Admitting: Physician Assistant

## 2013-06-02 VITALS — BP 130/82 | HR 66 | Ht 70.0 in | Wt 165.0 lb

## 2013-06-02 DIAGNOSIS — R29898 Other symptoms and signs involving the musculoskeletal system: Secondary | ICD-10-CM | POA: Diagnosis not present

## 2013-06-02 DIAGNOSIS — R079 Chest pain, unspecified: Secondary | ICD-10-CM

## 2013-06-02 DIAGNOSIS — I2510 Atherosclerotic heart disease of native coronary artery without angina pectoris: Secondary | ICD-10-CM | POA: Diagnosis not present

## 2013-06-02 DIAGNOSIS — E785 Hyperlipidemia, unspecified: Secondary | ICD-10-CM | POA: Diagnosis not present

## 2013-06-02 NOTE — Patient Instructions (Addendum)
Your physician has requested that you have en exercise stress myoview. For further information please visit [email or web address] Please follow instruction sheet, as given.  Your physician wants you to follow-up in: 6 MONTHS WITH DR. Shirlee Latch. You will receive a reminder letter in the mail two months in advance. If you don't receive a letter, please call our office to schedule the follow-up appointment.  NO CHANGES WERE MADE TODAY

## 2013-06-02 NOTE — Progress Notes (Signed)
[address] [place], [place]  [address] Phone: [phone] Fax:  [phone]  Date:  06/02/2013   ID:  Danny [name], DOB 03/18/1936, MRN [id number]  PCP:  Darnelle Bos, MD  Cardiologist:  Dr. Marca Ancona     History of Present Illness: Danny [name] is a 77 y.o. male who returns for evaluation of left arm symptoms.  He initially presented for followup of an abnormal myoview. He had noted exertional dyspnea [place] for at least a year. His friends and wife had begun to notice heavy breathing when he exerted himself. He would feel subjectively short of breath after walking around the block but would not have to stop walking.  He had no chest pain or tightness. He saw Dr. Mayford Knife, who set him up for an ETT-myoview. He had good exercise tolerance, reaching 11 METS, with normal rest and stress perfusion. However, he had an elevated TID ratio. Given the equivocal findings on myoview, Dr. Mayford Knife sent him here for assessment for coronary CTA. Coronary CTA showed minimal disease in the LAD and RCA but there was possible CFX occlusion (versus artifact). It was thought artifact was most likely, but given symptoms and equivocal study, he was sent for cardiac cath. I did a LHC in 10/12 that showed mild nonobstructive coronary disease and normal EF.  Last seen by Dr. Shirlee Latch 07/2011.  He has a long history of left arm numbness and tingling. Over the last year he has also noted left supraorbital headache that is continuous as well as left ear tinnitus.  He has seen his orthopedist because he thought that his left arm symptoms may be related to rotator cuff tendinopathy. EMG and nerve conduction studies were apparently normal. He has also seen neurology. MRI demonstrated minor disc changes in his neck. He was also seen by ENT with insignificant findings. Brain MRI is pending. He stopped his statin for a period of time to see if this would help. There was no  change in his symptoms. He denies exertional symptoms. He continues to note occasional chest tightness. This is nonexertional. He denies significant shortness of breath. He is NYHA class II. He denies orthopnea, PND or edema. He denies syncope.  Labs (8/14):  K 3.9, creatinine 0.89, ALT 51, TSH 5.370, Hgb A1c 6.3  Wt Readings from Last 3 Encounters:  06/02/13 165 lb (74.844 kg)  04/28/13 169 lb (76.658 kg)  09/25/11 174 lb (78.926 kg)     Past Medical History  Diagnosis Date  . Diverticulosis   . Hepatitis, chronic     clinical grounds/no bx in the past  . Sixth nerve palsy 2001  . Impaired fasting glucose   . Hemorrhoids   . Allergic rhinitis   . Detached vitreous humor   . Lumbar radiculopathy   . BPH (benign prostatic hypertrophy)     Dr. Laverle Patter  . Ingrown toenail   . Rotator cuff disorder   . Onychomycosis   . Arthritis   . Heart murmur   . Cervical disc disease   . Numbness and tingling in left arm   . Coronary artery disease     Patient developed exertional dyspnea. ETT-myoview (8/12) with 11 METS, normal LV EF, normal perfusion at rest and stress but elevated TID ratio. Coronary CT angiogram in 9/12 showed equivocal significant disease in the CFX. Cath in 10/12 showed EF 55-60%, 30% mid RCA, 40% distal RCA, 40% proximal LAD.   [name] Diverticulosis  Current Outpatient Prescriptions  Medication Sig Dispense Refill  . aspirin 81 MG tablet Take 81 mg by mouth daily.        [name] atorvastatin (LIPITOR) 20 MG tablet Take 1 tablet (20 mg total) by mouth daily.  30 tablet  0  . Calcium Citrate-Vitamin D (CITRACAL + D PO) Take 1 tablet by mouth daily.      . Coenzyme Q10 (COQ10) 200 MG CAPS Take 400 mg by mouth daily.       . finasteride (PROSCAR) 5 MG tablet Take 5 mg by mouth daily.        . Glucosamine 750 MG TABS Take by mouth as needed.       [name] ibuprofen (ADVIL,MOTRIN) 800 MG tablet Take 800 mg by mouth 3 (three) times daily.        . Multiple Vitamin (MULTIVITAMIN) tablet  Take 1 tablet by mouth daily.        . nitroGLYCERIN (NITROSTAT) 0.4 MG SL tablet Place 1 tablet (0.4 mg total) under the tongue every 5 (five) minutes as needed for chest pain.  90 tablet  3  . omeprazole (PRILOSEC) 20 MG capsule Take 20 mg by mouth daily.        . pregabalin (LYRICA) 100 MG capsule Take 100 mg by mouth daily.       . RESTASIS 0.05 % ophthalmic emulsion       . Saw Palmetto, Serenoa repens, (SAW PALMETTO PO) Take 1 tablet by mouth 2 (two) times daily.       No current facility-administered medications for this visit.    Allergies:   No Known Allergies  Social History:  The patient  reports that he has never smoked. He has never used smokeless tobacco. He reports that  drinks alcohol. He reports that he does not use illicit drugs.   ROS:  Please see the history of present illness.   He has constipation. He denies significant dyspepsia. He denies dysphagia. He denies melena. He has occasional hemorrhoidal bleeding. He denies symptoms consistent with amaurosis fugax or TIAs.   All other systems reviewed and negative.   PHYSICAL EXAM: VS:  BP 130/82  Pulse 66  Ht 5\' 10"  (1.778 m)  Wt 165 lb (74.844 kg)  BMI 23.68 kg/m2 Well nourished, well developed, in no acute distress HEENT: normal Neck: no JVD Vascular: No carotid bruits Cardiac:  normal S1, S2; RRR; 1/6 systolic murmur at the apex Lungs:  clear to auscultation bilaterally, no wheezing, rhonchi or rales Abd: soft, nontender, no hepatomegaly Ext: no edema Skin: warm and dry Neuro:  CNs 2-12 intact, no focal abnormalities noted, bilateral upper extremities strength normal and equal  EKG:  NSR, HR 66, normal axis, no acute changes     ASSESSMENT AND PLAN:  1. CAD:  He has occasional chest tightness. He has also had fairly continuous left arm symptoms. Workup to date has been largely unremarkable. He decided to follow up with cardiology to assess for ischemia as a cause of his symptoms. I suspect, given the  stability of his symptoms, that there has been no significant change in his CAD. I offered him exercise treamill testing.  However, he would rather proceed with ETT-Myoview. I will arrange for him to have this test performed.  Continue ASA and statin.  2. Hyperlipidemia:  Continue statin. 3. Headache:  Continue f/u with neurology and ENT. 4. Left Arm Weakness:  Continue f/u with neurology. 5. Disposition:  F/u with Dr. Marca Ancona in 6  mos or sooner if myoview abnormal.    Signed, Tereso Newcomer, PA-C  06/02/2013 10:26 AM

## 2013-06-18 ENCOUNTER — Ambulatory Visit
Admission: RE | Admit: 2013-06-18 | Discharge: 2013-06-18 | Disposition: A | Discharge status: Home / Self Care | Payer: Medicare Other | Source: Ambulatory Visit | Attending: Neurology | Admitting: Neurology

## 2013-06-18 DIAGNOSIS — R209 Unspecified disturbances of skin sensation: Secondary | ICD-10-CM | POA: Diagnosis not present

## 2013-06-18 DIAGNOSIS — R51 Headache: Secondary | ICD-10-CM | POA: Diagnosis not present

## 2013-06-18 MED ORDER — GADOBENATE DIMEGLUMINE 529 MG/ML IV SOLN
15.0000 mL | Freq: Once | INTRAVENOUS | Status: AC | PRN
  Administered 2013-06-18: 15 mL via INTRAVENOUS

## 2013-06-22 ENCOUNTER — Ambulatory Visit (HOSPITAL_COMMUNITY): Payer: Medicare Other | Attending: Cardiology | Admitting: Radiology

## 2013-06-22 VITALS — BP 130/91 | HR 66 | Ht 70.0 in | Wt 164.0 lb

## 2013-06-22 DIAGNOSIS — I4949 Other premature depolarization: Secondary | ICD-10-CM

## 2013-06-22 DIAGNOSIS — R0789 Other chest pain: Secondary | ICD-10-CM | POA: Insufficient documentation

## 2013-06-22 DIAGNOSIS — I2510 Atherosclerotic heart disease of native coronary artery without angina pectoris: Secondary | ICD-10-CM | POA: Diagnosis not present

## 2013-06-22 DIAGNOSIS — R0989 Other specified symptoms and signs involving the circulatory and respiratory systems: Secondary | ICD-10-CM | POA: Insufficient documentation

## 2013-06-22 DIAGNOSIS — R0609 Other forms of dyspnea: Secondary | ICD-10-CM | POA: Insufficient documentation

## 2013-06-22 DIAGNOSIS — R0602 Shortness of breath: Secondary | ICD-10-CM

## 2013-06-22 DIAGNOSIS — R079 Chest pain, unspecified: Secondary | ICD-10-CM

## 2013-06-22 DIAGNOSIS — E785 Hyperlipidemia, unspecified: Secondary | ICD-10-CM

## 2013-06-22 MED ORDER — TECHNETIUM TC 99M SESTAMIBI GENERIC - CARDIOLITE
30.0000 | Freq: Once | INTRAVENOUS | Status: AC | PRN
  Administered 2013-06-22: 30 via INTRAVENOUS

## 2013-06-22 MED ORDER — TECHNETIUM TC 99M SESTAMIBI GENERIC - CARDIOLITE
10.0000 | Freq: Once | INTRAVENOUS | Status: AC | PRN
  Administered 2013-06-22: 10 via INTRAVENOUS

## 2013-06-22 NOTE — Progress Notes (Addendum)
[facility] SITE 3 NUCLEAR MED [address] [place], [place] [address] [phone]    Cardiology Nuclear Med Study  Danny [name] is a 77 y.o. male     MRN : [id number]     DOB: 1935/10/24  Procedure Date: 06/22/2013  Nuclear Med Background Indication for Stress Test:  Evaluation for Ischemia History: 04-2011 Myocardial Perfusion Study-no ischemia, increase TID ratio, Dr. Mayford Knife; 05-2011 CT: CFX disease; 06-2011 Cath: Nonobstructive CAD, EF=55-60% Cardiac Risk Factors: No known risk factors  Symptoms:  Chest Tightness (last episode of chest discomfort was about a week ago) and DOE   Nuclear Pre-Procedure Caffeine/Decaff Intake:  None > 12 hrs NPO After: 9:00pm   Lungs:  Clear. O2 Sat: 99% on room air. IV 0.9% NS with Angio Cath:  20g  IV Site: R Antecubital x1, tolerated well IV Started by:  Irean Hong, RN  Chest Size (in):  42 Cup Size: n/a  Height: 5\' 10"  (1.778 m)  Weight:  164 lb (74.39 kg)  BMI:  Body mass index is 23.53 kg/(m^2). Tech Comments:  No medication today    Nuclear Med Study 1 or 2 day study: 1 day  Stress Test Type:  Stress  Reading MD: Olga Millers, MD  Order Authorizing Provider:  Marca Ancona, MD  Resting Radionuclide: Technetium 37m Sestamibi  Resting Radionuclide Dose: 11.0 mCi   Stress Radionuclide:  Technetium 29m Sestamibi  Stress Radionuclide Dose: 33.0 mCi           Stress Protocol Rest HR: 66 Stress HR: 144  Rest BP: 130/91 Stress BP: 170/95  Exercise Time (min): 11:08 METS: 13.4   Predicted Max HR: 145 bpm % Max HR: 99.31 bpm     Dose of Adenosine (mg):  n/a Dose of Lexiscan: n/a mg  Dose of Atropine (mg): n/a Dose of Dobutamine: n/a mcg/kg/min (at max HR)  Stress Test Technologist: Smiley Houseman, CMA-N  Nuclear Technologist:  Domenic Polite, CNMT     Rest Procedure:  Myocardial perfusion imaging was performed at rest 45 minutes following the intravenous administration of Technetium 48m Sestamibi.  Rest ECG:  No acute changes and NSR with first degree AV block.  Stress Procedure:  The patient exercised on the treadmill utilizing the Bruce Protocol for 11:08 minutes. The patient stopped due to dyspnea and fatigue.  He  denied any chest pain.  There were occasional PAC's noted.  Technetium 52m Sestamibi was injected at peak exercise and myocardial perfusion imaging was performed after a brief delay.  Stress ECG: No significant ST segment change suggestive of ischemia.  QPS Raw Data Images:  Acquisition technically good; normal left ventricular size. Stress Images:  There is decreased uptake in the inferior wall. Rest Images:  There is decreased uptake in the inferior wall. Subtraction (SDS):  No evidence of ischemia. Transient Ischemic Dilatation (Normal <1.22):  NA Lung/Heart Ratio (Normal <0.45):  0.40  Quantitative Gated Spect Images QGS EDV:  87 ml QGS ESV:  33 ml  Impression Exercise Capacity:  Good exercise capacity. BP Response:  Normal blood pressure response. Clinical Symptoms:  There is dyspnea. ECG Impression:  No significant ST segment change suggestive of ischemia. Comparison with Prior Nuclear Study: No images to compare  Overall Impression:  Normal stress nuclear study with a small, moderate intensity, fixed inferior defect consistent with diaphragmatic attenuation; no ischemia.  LV Ejection Fraction: 62%.  LV Wall Motion:  NL LV Function; NL Wall Motion   Brian Crenshaw  Normal study with some  attenuation.   Marca Ancona 06/24/2013

## 2013-06-23 ENCOUNTER — Encounter: Payer: Self-pay | Admitting: Physician Assistant

## 2013-06-24 ENCOUNTER — Telehealth: Payer: Self-pay | Admitting: Cardiology

## 2013-06-24 NOTE — Telephone Encounter (Signed)
LMTCB

## 2013-06-24 NOTE — Telephone Encounter (Signed)
Follow Up  Nuclear stress test results.

## 2013-06-24 NOTE — Telephone Encounter (Signed)
New message  ° ° °Nuclear stress test results.   °

## 2013-06-25 NOTE — Progress Notes (Signed)
Pt.notified

## 2013-06-25 NOTE — Telephone Encounter (Signed)
Follow Up ° °Pt returning the call  °

## 2013-06-25 NOTE — Telephone Encounter (Signed)
Spoke with patient about recent myoview results. 

## 2013-06-26 ENCOUNTER — Telehealth: Payer: Self-pay | Admitting: Neurology

## 2013-06-29 ENCOUNTER — Telehealth: Payer: Self-pay | Admitting: *Deleted

## 2013-06-29 NOTE — Telephone Encounter (Signed)
I called pt and relayed the MRI results for wife.  Released to MyChart and also mailed.

## 2013-06-30 DIAGNOSIS — R7989 Other specified abnormal findings of blood chemistry: Secondary | ICD-10-CM | POA: Diagnosis not present

## 2013-06-30 DIAGNOSIS — E039 Hypothyroidism, unspecified: Secondary | ICD-10-CM | POA: Diagnosis not present

## 2013-07-23 ENCOUNTER — Other Ambulatory Visit: Payer: Self-pay

## 2013-08-03 ENCOUNTER — Ambulatory Visit (INDEPENDENT_AMBULATORY_CARE_PROVIDER_SITE_OTHER): Payer: Medicare Other | Admitting: Internal Medicine

## 2013-08-03 DIAGNOSIS — M79609 Pain in unspecified limb: Secondary | ICD-10-CM

## 2013-08-03 DIAGNOSIS — M7989 Other specified soft tissue disorders: Secondary | ICD-10-CM | POA: Diagnosis not present

## 2013-08-03 NOTE — Progress Notes (Signed)
Subjective:    Patient ID: Danny [name], male    DOB: 05-18-1936, 77 y.o.   MRN: [id number] This chart was scribed for Ellamae Sia, MD by Clydene Laming, ED Scribe. This patient was seen in room 01 and the patient's care was started at 6:02 PM. HPI HPI Comments: Danny [name] is a 77 y.o. male who presents to the [facility] complaining of right hand injury. Pt reports working with his hands yesterday when he must've hit his knuckle and did not realize it. Pt denies pain and itch. The right hand flared up overnight. He does not think anything is broken.    Patient Active Problem List   Diagnosis Date Noted  . Cervical disc disease   . CAD (coronary artery disease) 08/07/2011  . Exertional dyspnea 06/10/2011     No Known Allergies Prior to Admission medications   Medication Sig Start Date End Date Taking? Authorizing Provider  aspirin 81 MG tablet Take 81 mg by mouth daily.     Yes Historical Provider, MD  atorvastatin (LIPITOR) 20 MG tablet Take 1 tablet (20 mg total) by mouth daily. 12/15/12 12/15/13 Yes Gaylord Shih, MD  Calcium Citrate-Vitamin D (CITRACAL + D PO) Take 1 tablet by mouth daily.   Yes Historical Provider, MD  Coenzyme Q10 (COQ10) 200 MG CAPS Take 400 mg by mouth daily.    Yes Historical Provider, MD  finasteride (PROSCAR) 5 MG tablet Take 5 mg by mouth daily.     Yes Historical Provider, MD  Glucosamine 750 MG TABS Take by mouth as needed.    Yes Historical Provider, MD  ibuprofen (ADVIL,MOTRIN) 800 MG tablet Take 800 mg by mouth 3 (three) times daily.     Yes Historical Provider, MD  Multiple Vitamin (MULTIVITAMIN) tablet Take 1 tablet by mouth daily.     Yes Historical Provider, MD  omeprazole (PRILOSEC) 20 MG capsule Take 20 mg by mouth daily.     Yes Historical Provider, MD  pregabalin (LYRICA) 100 MG capsule Take 100 mg by mouth daily.    Yes Historical Provider, MD  RESTASIS 0.05 % ophthalmic emulsion  03/11/13  Yes Historical  Provider, MD  Saw Palmetto, Serenoa repens, (SAW PALMETTO PO) Take 1 tablet by mouth 2 (two) times daily.   Yes Historical Provider, MD  nitroGLYCERIN (NITROSTAT) 0.4 MG SL tablet Place 1 tablet (0.4 mg total) under the tongue every 5 (five) minutes as needed for chest pain. 06/21/11 06/02/13  Laurey Morale, MD       Review of Systems  Musculoskeletal: Negative for myalgias.       Right hand injury       Objective:   Physical Exam  Nursing note and vitals reviewed. Constitutional: He is oriented to person, place, and time. He appears well-developed and well-nourished. No distress.  Awake, alert, nontoxic appearance  Cardiovascular: Normal rate.   Pulmonary/Chest: Effort normal.  Musculoskeletal: Normal range of motion. He exhibits no edema.  R hand with cystic soft swelling over dorsum 3rd MCP-nontender Full rom/grip Swelling over dorsum hand in general witout cellulitis or tenderness  Neurological: He is alert and oriented to person, place, and time.     Psychiatric: He has a normal mood and affect.   Filed Vitals:   08/03/13 1758  BP: 124/76  Pulse: 74  Temp: 99 F (37.2 C)  TempSrc: Oral  Resp: 18  Height: 5\' 9"  (1.753 m)  Weight: 163 lb (73.936 kg)  SpO2: 98%  Assessment & Plan:  6:04 PM- Discussed treatment plan with pt at bedside. Pt verbalized understanding and agreement with plan.   Contusion Hand with synovial cyst Heat w/ ROM bid 2 weeks  I have completed the patient encounter in its entirety as documented by the scribe, with editing by me where necessary. Dandrea Medders P. Merla Riches, M.D.

## 2013-08-04 DIAGNOSIS — Z125 Encounter for screening for malignant neoplasm of prostate: Secondary | ICD-10-CM | POA: Diagnosis not present

## 2013-08-04 DIAGNOSIS — N400 Enlarged prostate without lower urinary tract symptoms: Secondary | ICD-10-CM | POA: Diagnosis not present

## 2013-08-18 ENCOUNTER — Encounter: Payer: Self-pay | Admitting: Neurology

## 2013-08-18 ENCOUNTER — Ambulatory Visit (INDEPENDENT_AMBULATORY_CARE_PROVIDER_SITE_OTHER): Payer: Medicare Other | Admitting: Neurology

## 2013-08-18 VITALS — BP 124/79 | HR 72 | Temp 97.2°F | Ht 69.0 in | Wt 161.0 lb

## 2013-08-18 DIAGNOSIS — R51 Headache: Secondary | ICD-10-CM | POA: Diagnosis not present

## 2013-08-18 DIAGNOSIS — R209 Unspecified disturbances of skin sensation: Secondary | ICD-10-CM

## 2013-08-18 DIAGNOSIS — M479 Spondylosis, unspecified: Secondary | ICD-10-CM

## 2013-08-18 DIAGNOSIS — R200 Anesthesia of skin: Secondary | ICD-10-CM

## 2013-08-18 NOTE — Patient Instructions (Signed)
I think overall you are doing fairly well and are stable at this point.   I do have some generic suggestions for you today:   Please make sure that you drink plenty of fluids. I would like for you to exercise daily for example in the form of walking 20-30 minutes every day, if you can. Please keep a regular sleep-wake schedule, keep regular meal times, do not skip any meals, eat  healthy snacks in between meals, such as fruit or nuts. Try to eat protein with every meal.   As far as your medications are concerned, I would like to suggest: no new medication, but you can try Neti Pot for saline nasal rinses, which can help nasal congestion, sinus symptoms.    As far as diagnostic testing, I recommend: no new test.    I think you're stable enough that I can see you back on an as needed basis.

## 2013-08-18 NOTE — Progress Notes (Signed)
Subjective:    Patient ID: Danny [name] is a 77 y.o. male.  HPI   Interim history:   Danny [name] is a very pleasant 77 year old right-handed gentleman with an underlying medical history of CAD, GER, OA, s/p back surgery in 2010 and 2011, and HLP, who presents for followup consultation of his left upper extremity numbness and tingling. He is unaccompanied today. I first met him on 04/28/2013, at which time I suggested blood work, a cervical spine MRI, as well as an EMG and nerve conduction test. His EMG and nerve conduction velocity test from 04/30/2013 was reported as normal. His blood test results were for the most part unremarkable with the exception of a slightly elevated hemoglobin A1c, a slightly elevated TSH at 5.3, and mildly elevated liver enzymes. We called him with the test results and suggested that he discuss these findings also with his primary care physician. He cervical spine MRI without contrast on 05/20/2013: Mildly abnormal MRI scan of the cervical spine showing exaggerated forward lordotic curvature and minor disc degenerative changes without significant compression. He was also called with the test results. In the interim, his wife called back reporting new onset headaches and at their request I ordered a brain MRI with and without contrast. He had this on 06/18/2013: Abnormal MRI scan of the brain showing mild changes of chronic microvascular ischemia and generalized cerebral atrophy. They were called with the test results. He was supposed to see an ear nose throat physician but did not have an appointment until last month and was given a set of hearing aids, which are somewhat difficult to get used to. There was no damage to his TMs.  Has been experiencing persistent numbness and tingling in his left upper extremity for the past 1+ year. This is more pronounced at night and he exhibited symptoms of median neuropathy however recent nerve conduction studies in your office showed  no evidence of left or right median nerve problems or ulnar neuropathy at the elbow. He has been experiencing a buzzing sound in his left ear for the past year and is scheduled to see an ear nose throat doctor at [facility] this month. Nothing helps or affects the buzzing sound in his ear.  He previously had left rotator cuff problems and had surgery in 2008, and after that he had a muscle tear, but this was not repaired. His Sx are primarily in the hand on the L and forearm. Sx are plateaued and are mildly bothersome. He has no new complaints. He had an injury to his R knuckle without sequelae. He feels stable.   His Past Medical History Is Significant For: Past Medical History  Diagnosis Date  . Diverticulosis   . Hepatitis, chronic     clinical grounds/no bx in the past  . Sixth nerve palsy 2001  . Impaired fasting glucose   . Hemorrhoids   . Allergic rhinitis   . Detached vitreous humor   . Lumbar radiculopathy   . BPH (benign prostatic hypertrophy)     Dr. Laverle Patter  . Ingrown toenail   . Rotator cuff disorder   . Onychomycosis   . Arthritis   . Heart murmur   . Cervical disc disease   . Numbness and tingling in left arm   . Coronary artery disease     Patient developed exertional dyspnea. ETT-myoview (8/12) with 11 METS, normal LV EF, normal perfusion at rest and stress but elevated TID ratio. Coronary CT angiogram in 9/12 showed  equivocal significant disease in the CFX. Cath in 10/12 showed EF 55-60%, 30% mid RCA, 40% distal RCA, 40% proximal LAD. ETT-Myoview (10/14): Diaphragmatic attenuation, no ischemia, EF 62%, normal study  . Diverticulosis     His Past Surgical History Is Significant For: Past Surgical History  Procedure Laterality Date  . Abnormal stress test    . Spine surgery  07/06/10 and 09/07/09    spinal fusion  . Rotator cuff repair  07/17/2007    left  . Hernia repair  1996    [facility]  . Hemorrhoid surgery  1965  . Appendectomy  1957  . Tonsillectomy and  adenoidectomy  1947  . Back surgery  2010,2011    lumbar fusion-  . Cardiac catheterization    . Lipoma excision  09/05/2011    Procedure: EXCISION LIPOMA;  Surgeon: Currie Paris, MD;  Location: [facility];  Service: General;  Laterality: Left;  removal of 4 cm lipoma of back    His Family History Is Significant For: Family History  Problem Relation Age of Onset  . Cancer Mother     multiple myeloma  . Stroke Father     His Social History Is Significant For: History   Social History  . Marital Status: Married    Spouse Name: [name]    Number of Children: [name]  . Years of Education: [name]   Occupational History  . 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 Smoking status: Never Smoker   . Smokeless tobacco: Never Used  . Alcohol Use: Yes  . Drug Use: No  . Sexual Activity: None   Other Topics Concern  . None   Social History Narrative  . None    His Allergies Are:  No Known Allergies:   His Current Medications Are:  Outpatient Encounter Prescriptions as of 08/18/2013  Medication Sig  . aspirin 81 MG tablet Take 81 mg by mouth daily.    [name] atorvastatin (LIPITOR) 20 MG tablet Take 1 tablet (20 mg total) by mouth daily.  . betamethasone valerate ointment (VALISONE) 0.1 % Apply 1 application topically as needed.  . Calcium Citrate-Vitamin D (CITRACAL + D PO) Take 1 tablet by mouth daily.  . Coenzyme Q10 (COQ10) 200 MG CAPS Take 400 mg by mouth daily.   . finasteride (PROSCAR) 5 MG tablet Take 5 mg by mouth daily.    . Glucosamine 750 MG TABS Take by mouth as needed.   [name] ibuprofen (ADVIL,MOTRIN) 800 MG tablet Take 800 mg by mouth 3 (three) times daily.    . Multiple Vitamin (MULTIVITAMIN) tablet Take 1 tablet by mouth daily.    [name] omeprazole (PRILOSEC) 20 MG capsule Take 20 mg by mouth daily.    . pregabalin (LYRICA) 100 MG capsule Take 100 mg by mouth daily.   . RESTASIS 0.05 % ophthalmic emulsion   . Saw  Palmetto, Serenoa repens, (SAW PALMETTO PO) Take 1 tablet by mouth 2 (two) times daily.  . nitroGLYCERIN (NITROSTAT) 0.4 MG SL tablet Place 1 tablet (0.4 mg total) under the tongue every 5 (five) minutes as needed for chest pain.    Review of Systems:  Out of a complete 14 point review of systems, all are reviewed and negative with the exception of these symptoms as listed below:  Review of Systems  Constitutional: Negative.   HENT: Positive for hearing loss and tinnitus.   Eyes: Negative.   Respiratory: Negative.  Cardiovascular: Negative.   Gastrointestinal: Negative.   Endocrine: Negative.   Genitourinary: Negative.   Musculoskeletal: Negative.   Skin: Negative.   Allergic/Immunologic: Negative.   Neurological: Positive for numbness and headaches.  Hematological: Bruises/bleeds easily.  Psychiatric/Behavioral: Negative.     Objective:  Neurologic Exam  Physical Exam Physical Examination:   Filed Vitals:   08/18/13 1506  BP: 124/79  Pulse: 72  Temp: 97.2 F (36.2 C)   General Examination: The patient is a very pleasant 77 y.o. male in no acute distress. He appears well-developed and well-nourished and very well groomed. He is mildly anxious appearing, but in very good spirits.  HEENT: Normocephalic, atraumatic, pupils are equal, round and reactive to light and accommodation. Extraocular tracking is good without limitation to gaze excursion or nystagmus noted. Normal smooth pursuit is noted. Hearing is grossly intact. Face is symmetric with normal facial animation and normal facial sensation. Speech is clear with no dysarthria noted. There is no hypophonia. There is no lip, neck/head, jaw or voice tremor. Neck is supple with full range of passive and active motion. There are no carotid bruits on auscultation. Oropharynx exam reveals: mild mouth dryness, adequate dental hygiene and mild airway crowding. Mallampati is class II. Tongue protrudes centrally and palate elevates  symmetrically.    Chest: Clear to auscultation without wheezing, rhonchi or crackles noted.  Heart: S1+S2+0, regular and normal without murmurs, rubs or gallops noted.   Abdomen: Soft, non-tender and non-distended with normal bowel sounds appreciated on auscultation.  Extremities: There is no pitting edema in the distal lower extremities bilaterally. Pedal pulses are intact.  Skin: Warm and dry without trophic changes noted. There are no varicose veins.  Musculoskeletal: exam reveals mild joint swelling in his proximal finger joints.   Neurologically:  Mental status: The patient is awake, alert and oriented in all 4 spheres. His memory, attention, language and knowledge are appropriate. There is no aphasia, agnosia, apraxia or anomia. Speech is clear with normal prosody and enunciation. Thought process is linear. Mood is congruent and affect is normal.  Cranial nerves are as described above under HEENT exam. In addition, shoulder shrug is normal with equal shoulder height noted. Motor exam: Normal bulk, strength and tone is noted. There is no drift, tremor or rebound. Romberg is negative. Reflexes are 2+ throughout. Fine motor skills are intact with normal finger taps, normal hand movements, normal rapid alternating patting, normal foot taps and normal foot agility.  Cerebellar testing shows no dysmetria or intention tremor on finger to nose testing. Heel to shin is unremarkable bilaterally. There is no truncal or gait ataxia.  Sensory exam is intact to light touch, pinprick, vibration, temperature sense in the upper and lower extremities. I do notice a slight degree of muscle atrophy in his thenar profile and he has mild joint swelling in the proximal finger joints, bilaterally.   Gait, station and balance are unremarkable. No veering to one side is noted. No leaning to one side is noted. Posture is age-appropriate and stance is narrow based. No problems turning are noted. He turns en bloc.  Tandem walk is unremarkable. Intact toe and heel stance is noted.               Assessment and Plan:   In summary, WALDEN STATZ is a very pleasant 77 y.o.-year old male with a history of left arm and hand numbness and tingling. He feels stable and his exam is stable. His workup included blood work, EMG and nerve  conduction testing, MRI neck and MRI head. His blood work showed a borderline hemoglobin A1c and a borderline TSH. He has an appointment with his primary care physician later on this month and is scheduled to have some blood work done at the time. I would recommend rechecking his TSH and hemoglobin A1c at the time. He has also seen an otolaryngologist and has been provided with a new set of hearing aids. He is still adjusting to those. He feels that that has helped his tinnitus to some degree. He is without new complaints today. At this juncture, I have advised the patient to followup with me on an as-needed basis. I have asked him to stay active and walk regularly. I have furthermore talked to him about staying well hydrated and keeping her healthy lifestyle in general. His left arm and hand numbness and tingling are not progressive and have plateaued at this time. Workup has not suggested any sinister etiology for these symptoms. He is reassured in that regard and is agreeable to following up with his primary care physician from now on. Most of my 25 minute visit today was spent in counseling and coordination of care, reviewing test results and reviewing medication.

## 2013-09-08 ENCOUNTER — Other Ambulatory Visit: Payer: Self-pay | Admitting: Internal Medicine

## 2013-09-08 ENCOUNTER — Ambulatory Visit
Admission: RE | Admit: 2013-09-08 | Discharge: 2013-09-08 | Disposition: A | Discharge status: Home / Self Care | Payer: Medicare Other | Source: Ambulatory Visit | Attending: Internal Medicine | Admitting: Internal Medicine

## 2013-09-08 DIAGNOSIS — H918X9 Other specified hearing loss, unspecified ear: Secondary | ICD-10-CM | POA: Diagnosis not present

## 2013-09-08 DIAGNOSIS — H6590 Unspecified nonsuppurative otitis media, unspecified ear: Secondary | ICD-10-CM | POA: Diagnosis not present

## 2013-09-08 DIAGNOSIS — R209 Unspecified disturbances of skin sensation: Secondary | ICD-10-CM | POA: Diagnosis not present

## 2013-09-08 DIAGNOSIS — I2510 Atherosclerotic heart disease of native coronary artery without angina pectoris: Secondary | ICD-10-CM | POA: Diagnosis not present

## 2013-09-08 DIAGNOSIS — R7989 Other specified abnormal findings of blood chemistry: Secondary | ICD-10-CM | POA: Diagnosis not present

## 2013-09-08 DIAGNOSIS — H9313 Tinnitus, bilateral: Secondary | ICD-10-CM

## 2013-09-08 DIAGNOSIS — R7301 Impaired fasting glucose: Secondary | ICD-10-CM | POA: Diagnosis not present

## 2013-09-08 DIAGNOSIS — R51 Headache: Secondary | ICD-10-CM | POA: Diagnosis not present

## 2013-09-08 DIAGNOSIS — E039 Hypothyroidism, unspecified: Secondary | ICD-10-CM | POA: Diagnosis not present

## 2013-09-08 DIAGNOSIS — H9190 Unspecified hearing loss, unspecified ear: Secondary | ICD-10-CM | POA: Diagnosis not present

## 2013-09-08 DIAGNOSIS — H9319 Tinnitus, unspecified ear: Secondary | ICD-10-CM | POA: Diagnosis not present

## 2013-09-24 DIAGNOSIS — J328 Other chronic sinusitis: Secondary | ICD-10-CM | POA: Diagnosis not present

## 2013-09-24 DIAGNOSIS — H9110 Presbycusis, unspecified ear: Secondary | ICD-10-CM | POA: Diagnosis not present

## 2013-09-24 DIAGNOSIS — H908 Mixed conductive and sensorineural hearing loss, unspecified: Secondary | ICD-10-CM | POA: Diagnosis not present

## 2013-09-24 DIAGNOSIS — H6500 Acute serous otitis media, unspecified ear: Secondary | ICD-10-CM | POA: Diagnosis not present

## 2013-09-25 DIAGNOSIS — H259 Unspecified age-related cataract: Secondary | ICD-10-CM | POA: Diagnosis not present

## 2013-09-25 DIAGNOSIS — H04129 Dry eye syndrome of unspecified lacrimal gland: Secondary | ICD-10-CM | POA: Diagnosis not present

## 2013-09-25 DIAGNOSIS — H43819 Vitreous degeneration, unspecified eye: Secondary | ICD-10-CM | POA: Diagnosis not present

## 2013-10-13 DIAGNOSIS — J321 Chronic frontal sinusitis: Secondary | ICD-10-CM | POA: Diagnosis not present

## 2013-10-13 DIAGNOSIS — H9110 Presbycusis, unspecified ear: Secondary | ICD-10-CM | POA: Diagnosis not present

## 2013-10-13 DIAGNOSIS — J328 Other chronic sinusitis: Secondary | ICD-10-CM | POA: Diagnosis not present

## 2013-10-13 DIAGNOSIS — H9319 Tinnitus, unspecified ear: Secondary | ICD-10-CM | POA: Diagnosis not present

## 2013-11-02 DIAGNOSIS — J328 Other chronic sinusitis: Secondary | ICD-10-CM | POA: Diagnosis not present

## 2013-11-04 DIAGNOSIS — D235 Other benign neoplasm of skin of trunk: Secondary | ICD-10-CM | POA: Diagnosis not present

## 2013-11-04 DIAGNOSIS — D485 Neoplasm of uncertain behavior of skin: Secondary | ICD-10-CM | POA: Diagnosis not present

## 2013-11-04 DIAGNOSIS — L819 Disorder of pigmentation, unspecified: Secondary | ICD-10-CM | POA: Diagnosis not present

## 2013-11-04 DIAGNOSIS — Z85820 Personal history of malignant melanoma of skin: Secondary | ICD-10-CM | POA: Diagnosis not present

## 2013-11-11 DIAGNOSIS — D2310 Other benign neoplasm of skin of unspecified eyelid, including canthus: Secondary | ICD-10-CM | POA: Diagnosis not present

## 2013-11-18 DIAGNOSIS — H9319 Tinnitus, unspecified ear: Secondary | ICD-10-CM | POA: Diagnosis not present

## 2013-11-18 DIAGNOSIS — H905 Unspecified sensorineural hearing loss: Secondary | ICD-10-CM | POA: Diagnosis not present

## 2013-11-18 DIAGNOSIS — J329 Chronic sinusitis, unspecified: Secondary | ICD-10-CM | POA: Diagnosis not present

## 2013-11-24 ENCOUNTER — Encounter: Payer: Self-pay | Admitting: Cardiology

## 2013-11-24 ENCOUNTER — Ambulatory Visit (INDEPENDENT_AMBULATORY_CARE_PROVIDER_SITE_OTHER): Payer: Medicare Other | Admitting: Cardiology

## 2013-11-24 ENCOUNTER — Encounter: Payer: Self-pay | Admitting: *Deleted

## 2013-11-24 VITALS — BP 122/70 | Ht 69.0 in | Wt 166.0 lb

## 2013-11-24 DIAGNOSIS — E785 Hyperlipidemia, unspecified: Secondary | ICD-10-CM | POA: Diagnosis not present

## 2013-11-24 DIAGNOSIS — R011 Cardiac murmur, unspecified: Secondary | ICD-10-CM | POA: Insufficient documentation

## 2013-11-24 DIAGNOSIS — I2510 Atherosclerotic heart disease of native coronary artery without angina pectoris: Secondary | ICD-10-CM | POA: Diagnosis not present

## 2013-11-24 NOTE — Patient Instructions (Signed)
Your physician has requested that you have an echocardiogram. Echocardiography is a painless test that uses sound waves to create images of your heart. It provides your doctor with information about the size and shape of your heart and how well your heart's chambers and valves are working. This procedure takes approximately one hour. There are no restrictions for this procedure.  Your physician wants you to follow-up in: 1 year with Dr Aundra Dubin. (March 2016). You will receive a reminder letter in the mail two months in advance. If you don't receive a letter, please call our office to schedule the follow-up appointment.

## 2013-11-24 NOTE — Progress Notes (Signed)
Patient ID: Danny [name], male   DOB: 1936-03-27, 78 y.o.   MRN: [id number] PCP: Dr. Nehemiah Settle  78 yo with history of nonobstructive CAD presents for followup.  Since I last saw him, he was seen by PA Kathlen Mody for atypical chest pain and left arm tingling.  He had an ETT-Cardiolite in 10/14 that was probably normal with diaphragmatic attenuation.  He is no longer having chest pain.  He has stable mild dyspnea with heavy exertion.  He is able to play 18 holes of golf with no problems.  He continues to have left hand and forearm tingling.  He has had extensive neurological workup with no definite nerve problem found.    Labs (8/14): K 3.9, creatinine 0.89 Labs (12/14): LDL 62, HDL 48    ECG: NSR, 1st degree AV block.   PMH: 1. Rotator cuff surgery 2. Diverticulosis 3. Chronic persistent hepatitis 4. Impaired fasting glucose 5. 6th nerve palsy in 2001 6. Allergic rhinitis 7. Low back pain s/p two L-spine operations 8. CAD: Patient developed exertional dyspnea.  ETT-myoview (8/12) with 11 METS, normal LV EF, normal perfusion at rest and stress but elevated TID ratio.  Coronary CT angiogram in 9/12 showed equivocal significant disease in the CFX.  Cath in 10/12 showed EF 55-60%, 30% mid RCA, 40% distal RCA, 40% proximal LAD.  ETT-Cardiolite in 10/14 showed EF 62%, probably normal study with diaphragmatic attenuation.   FH: Father with CVA at 77  SH: Married, never smoked, rare ETOH.   ROS: All systems reviewed and negative except as per HPI.   Current Outpatient Prescriptions  Medication Sig Dispense Refill  . aspirin 81 MG tablet Take 81 mg by mouth daily.        [name] atorvastatin (LIPITOR) 20 MG tablet Take 1 tablet (20 mg total) by mouth daily.  30 tablet  0  . betamethasone valerate ointment (VALISONE) 0.1 % Apply 1 application topically as needed.      . Calcium Citrate-Vitamin D (CITRACAL + D PO) Take 1 tablet by mouth daily.      . Coenzyme Q10 (CO Q 10 PO) Take 400 mg by mouth  daily.      . finasteride (PROSCAR) 5 MG tablet Take 5 mg by mouth daily.        . Glucosamine 750 MG TABS Take by mouth as needed.       [name] ibuprofen (ADVIL,MOTRIN) 800 MG tablet Take 800 mg by mouth 3 (three) times daily.        . Multiple Vitamin (MULTIVITAMIN) tablet Take 1 tablet by mouth daily.        [name] omeprazole (PRILOSEC) 20 MG capsule Take 20 mg by mouth daily.        . pregabalin (LYRICA) 100 MG capsule Take 100 mg by mouth daily.       . RESTASIS 0.05 % ophthalmic emulsion       . Saw Palmetto, Serenoa repens, (SAW PALMETTO PO) Take 1 tablet by mouth 2 (two) times daily.      . nitroGLYCERIN (NITROSTAT) 0.4 MG SL tablet Place 1 tablet (0.4 mg total) under the tongue every 5 (five) minutes as needed for chest pain.  90 tablet  3   No current facility-administered medications for this visit.    BP 122/70  Ht 5\' 9"  (1.753 m)  Wt 75.297 kg (166 lb)  BMI 24.50 kg/m2 General: NAD Neck: No JVD, no thyromegaly or thyroid nodule.  Lungs: Clear to auscultation bilaterally with normal  respiratory effort. CV: Nondisplaced PMI.  Heart regular S1/S2, no S3/S4, 2/6 SEM RUSB.  No peripheral edema.  No carotid bruit.  Normal pedal pulses.  Abdomen: Soft, nontender, no hepatosplenomegaly, no distention.  Skin: Intact without lesions or rashes.  Neurologic: Alert and oriented x 3.  Psych: Normal affect. Extremities: No clubbing or cyanosis.   Assessment/Plan:  1. CAD: Nonobstructive on prior cath.  Low risk Cardiolite in 10/14.  Continue ASA 81 and statin.  2. Hyperlipidemia: Goal LDL < 70 with known CAD.  LDL at goal in 12/14.  3. Murmur: Patient has a systolic murmur in the aortic area.  I will get an echocardiogram to assess.   Followup in 1 year.   Loralie Champagne 11/24/2013

## 2013-12-01 DIAGNOSIS — H25019 Cortical age-related cataract, unspecified eye: Secondary | ICD-10-CM | POA: Diagnosis not present

## 2013-12-01 DIAGNOSIS — H52209 Unspecified astigmatism, unspecified eye: Secondary | ICD-10-CM | POA: Diagnosis not present

## 2013-12-01 DIAGNOSIS — H2510 Age-related nuclear cataract, unspecified eye: Secondary | ICD-10-CM | POA: Diagnosis not present

## 2013-12-01 DIAGNOSIS — H25039 Anterior subcapsular polar age-related cataract, unspecified eye: Secondary | ICD-10-CM | POA: Diagnosis not present

## 2013-12-01 DIAGNOSIS — H269 Unspecified cataract: Secondary | ICD-10-CM | POA: Diagnosis not present

## 2013-12-22 ENCOUNTER — Ambulatory Visit (HOSPITAL_COMMUNITY): Payer: Medicare Other | Attending: Cardiovascular Disease | Admitting: Cardiology

## 2013-12-22 DIAGNOSIS — R0989 Other specified symptoms and signs involving the circulatory and respiratory systems: Secondary | ICD-10-CM | POA: Insufficient documentation

## 2013-12-22 DIAGNOSIS — R0609 Other forms of dyspnea: Secondary | ICD-10-CM | POA: Diagnosis not present

## 2013-12-22 DIAGNOSIS — I2510 Atherosclerotic heart disease of native coronary artery without angina pectoris: Secondary | ICD-10-CM | POA: Diagnosis not present

## 2013-12-22 DIAGNOSIS — R011 Cardiac murmur, unspecified: Secondary | ICD-10-CM | POA: Insufficient documentation

## 2013-12-22 NOTE — Progress Notes (Signed)
Echo performed. 

## 2013-12-27 ENCOUNTER — Encounter: Payer: Self-pay | Admitting: *Deleted

## 2013-12-28 ENCOUNTER — Telehealth: Payer: Self-pay | Admitting: Cardiology

## 2013-12-28 NOTE — Telephone Encounter (Signed)
Spoke with patient about echo results 

## 2013-12-28 NOTE — Telephone Encounter (Signed)
New message ° ° ° ° ° °Want echo results °

## 2014-03-03 DIAGNOSIS — M24119 Other articular cartilage disorders, unspecified shoulder: Secondary | ICD-10-CM | POA: Diagnosis not present

## 2014-03-03 DIAGNOSIS — M67919 Unspecified disorder of synovium and tendon, unspecified shoulder: Secondary | ICD-10-CM | POA: Diagnosis not present

## 2014-03-03 DIAGNOSIS — D485 Neoplasm of uncertain behavior of skin: Secondary | ICD-10-CM | POA: Diagnosis not present

## 2014-03-03 DIAGNOSIS — K130 Diseases of lips: Secondary | ICD-10-CM | POA: Diagnosis not present

## 2014-03-26 DIAGNOSIS — R042 Hemoptysis: Secondary | ICD-10-CM | POA: Diagnosis not present

## 2014-05-05 DIAGNOSIS — D235 Other benign neoplasm of skin of trunk: Secondary | ICD-10-CM | POA: Diagnosis not present

## 2014-05-05 DIAGNOSIS — L821 Other seborrheic keratosis: Secondary | ICD-10-CM | POA: Diagnosis not present

## 2014-05-05 DIAGNOSIS — Z85820 Personal history of malignant melanoma of skin: Secondary | ICD-10-CM | POA: Diagnosis not present

## 2014-05-05 DIAGNOSIS — L819 Disorder of pigmentation, unspecified: Secondary | ICD-10-CM | POA: Diagnosis not present

## 2014-07-05 DIAGNOSIS — H2512 Age-related nuclear cataract, left eye: Secondary | ICD-10-CM | POA: Diagnosis not present

## 2014-07-05 DIAGNOSIS — H16103 Unspecified superficial keratitis, bilateral: Secondary | ICD-10-CM | POA: Diagnosis not present

## 2014-07-05 DIAGNOSIS — Z961 Presence of intraocular lens: Secondary | ICD-10-CM | POA: Diagnosis not present

## 2014-07-05 DIAGNOSIS — H04123 Dry eye syndrome of bilateral lacrimal glands: Secondary | ICD-10-CM | POA: Diagnosis not present

## 2014-08-03 DIAGNOSIS — H25032 Anterior subcapsular polar age-related cataract, left eye: Secondary | ICD-10-CM | POA: Diagnosis not present

## 2014-08-03 DIAGNOSIS — H25012 Cortical age-related cataract, left eye: Secondary | ICD-10-CM | POA: Diagnosis not present

## 2014-08-03 DIAGNOSIS — H2512 Age-related nuclear cataract, left eye: Secondary | ICD-10-CM | POA: Diagnosis not present

## 2014-08-03 DIAGNOSIS — H25042 Posterior subcapsular polar age-related cataract, left eye: Secondary | ICD-10-CM | POA: Diagnosis not present

## 2014-08-04 DIAGNOSIS — R3916 Straining to void: Secondary | ICD-10-CM | POA: Diagnosis not present

## 2014-08-04 DIAGNOSIS — N401 Enlarged prostate with lower urinary tract symptoms: Secondary | ICD-10-CM | POA: Diagnosis not present

## 2014-09-09 DIAGNOSIS — G4700 Insomnia, unspecified: Secondary | ICD-10-CM | POA: Diagnosis not present

## 2014-09-09 DIAGNOSIS — Z23 Encounter for immunization: Secondary | ICD-10-CM | POA: Diagnosis not present

## 2014-09-09 DIAGNOSIS — Z125 Encounter for screening for malignant neoplasm of prostate: Secondary | ICD-10-CM | POA: Diagnosis not present

## 2014-09-09 DIAGNOSIS — Z1211 Encounter for screening for malignant neoplasm of colon: Secondary | ICD-10-CM | POA: Diagnosis not present

## 2014-09-09 DIAGNOSIS — M545 Low back pain: Secondary | ICD-10-CM | POA: Diagnosis not present

## 2014-09-09 DIAGNOSIS — Z0000 Encounter for general adult medical examination without abnormal findings: Secondary | ICD-10-CM | POA: Diagnosis not present

## 2014-09-09 DIAGNOSIS — E780 Pure hypercholesterolemia: Secondary | ICD-10-CM | POA: Diagnosis not present

## 2014-09-09 DIAGNOSIS — I2510 Atherosclerotic heart disease of native coronary artery without angina pectoris: Secondary | ICD-10-CM | POA: Diagnosis not present

## 2014-09-14 DIAGNOSIS — E780 Pure hypercholesterolemia: Secondary | ICD-10-CM | POA: Diagnosis not present

## 2014-09-14 DIAGNOSIS — Z125 Encounter for screening for malignant neoplasm of prostate: Secondary | ICD-10-CM | POA: Diagnosis not present

## 2014-10-01 DIAGNOSIS — R509 Fever, unspecified: Secondary | ICD-10-CM | POA: Diagnosis not present

## 2014-10-01 DIAGNOSIS — J069 Acute upper respiratory infection, unspecified: Secondary | ICD-10-CM | POA: Diagnosis not present

## 2014-11-03 DIAGNOSIS — Z08 Encounter for follow-up examination after completed treatment for malignant neoplasm: Secondary | ICD-10-CM | POA: Diagnosis not present

## 2014-11-03 DIAGNOSIS — L821 Other seborrheic keratosis: Secondary | ICD-10-CM | POA: Diagnosis not present

## 2014-11-03 DIAGNOSIS — Z85820 Personal history of malignant melanoma of skin: Secondary | ICD-10-CM | POA: Diagnosis not present

## 2014-11-03 DIAGNOSIS — D225 Melanocytic nevi of trunk: Secondary | ICD-10-CM | POA: Diagnosis not present

## 2014-11-03 DIAGNOSIS — L570 Actinic keratosis: Secondary | ICD-10-CM | POA: Diagnosis not present

## 2015-01-04 ENCOUNTER — Encounter: Payer: Self-pay | Admitting: Cardiology

## 2015-01-04 ENCOUNTER — Ambulatory Visit (INDEPENDENT_AMBULATORY_CARE_PROVIDER_SITE_OTHER): Payer: Medicare Other | Admitting: Cardiology

## 2015-01-04 VITALS — BP 122/86 | HR 71 | Ht 69.0 in | Wt 167.0 lb

## 2015-01-04 DIAGNOSIS — R011 Cardiac murmur, unspecified: Secondary | ICD-10-CM

## 2015-01-04 DIAGNOSIS — I2510 Atherosclerotic heart disease of native coronary artery without angina pectoris: Secondary | ICD-10-CM

## 2015-01-04 NOTE — Progress Notes (Signed)
Patient ID: [name], male   DOB: 1935-11-27, 79 y.o.   MRN: [id number] PCP: Dr. Julianne Rice  79 yo with history of nonobstructive CAD presents for followup.  He last had an ETT-Cardiolite in 10/14 that was probably normal with diaphragmatic attenuation.  Echo in 4/15 showed EF 55-60%, mild MR.    For a long time, he has been taking NTG due to chest burning about once a week.  It is almost always after supper when he has laid down to go to bed (couple hours after eating).   He takes omeprazole 20 mg daily in the morning. No exertional chest pain.  He walks 2 miles/day for exercise, no significant dyspnea unless he goes up a hill.  OK with a flight of steps.  Plays golf regularly. Weight stable.   Labs (8/14): K 3.9, creatinine 0.89 Labs (12/14): LDL 62, HDL 48    ECG: NSR, 1st degree AV block.   PMH: 1. Rotator cuff surgery 2. Diverticulosis 3. Chronic persistent hepatitis 4. Impaired fasting glucose 5. 6th nerve palsy in 2001 6. Allergic rhinitis 7. Low back pain s/p two L-spine operations 8. CAD: Patient developed exertional dyspnea.  ETT-myoview (8/12) with 11 METS, normal LV EF, normal perfusion at rest and stress but elevated TID ratio.  Coronary CT angiogram in 9/12 showed equivocal significant disease in the CFX.  Cath in 10/12 showed EF 55-60%, 30% mid RCA, 40% distal RCA, 40% proximal LAD.  ETT-Cardiolite in 10/14 showed EF 62%, probably normal study with diaphragmatic attenuation.   Echo (4/15) with EF 55-60%, mild MR.   FH: Father with CVA at 44  SH: Married, never smoked, rare ETOH.   ROS: All systems reviewed and negative except as per HPI.   Current Outpatient Prescriptions  Medication Sig Dispense Refill  . aspirin 81 MG tablet Take 81 mg by mouth daily.      [name] atorvastatin (LIPITOR) 20 MG tablet Take 1 tablet (20 mg total) by mouth daily. 30 tablet 0  . betamethasone valerate ointment (VALISONE) 0.1 % Apply 1 application topically as needed.    . Calcium  Citrate-Vitamin D (CITRACAL + D PO) Take 1 tablet by mouth daily.    . Coenzyme Q10 (CO Q 10 PO) Take 400 mg by mouth daily.    . finasteride (PROSCAR) 5 MG tablet Take 5 mg by mouth daily.      . Glucosamine 750 MG TABS Take by mouth 2 (two) times daily.     [name] ibuprofen (ADVIL,MOTRIN) 800 MG tablet Take 800 mg by mouth 3 (three) times daily.      . Multiple Vitamin (MULTIVITAMIN) tablet Take 1 tablet by mouth daily.      . nitroGLYCERIN (NITROSTAT) 0.4 MG SL tablet Place 1 tablet (0.4 mg total) under the tongue every 5 (five) minutes as needed for chest pain. 90 tablet 3  . omeprazole (PRILOSEC) 20 MG capsule Take 1 tablet by mouth two times a day 30 capsule 11  . pregabalin (LYRICA) 100 MG capsule Take 100 mg by mouth daily.     . RESTASIS 0.05 % ophthalmic emulsion Place 1 drop into both eyes 2 (two) times daily.     . Saw Palmetto, Serenoa repens, (SAW PALMETTO PO) Take 1 tablet by mouth 2 (two) times daily.     No current facility-administered medications for this visit.    BP 122/86 mmHg  Pulse 71  Ht 5\' 9"  (1.753 m)  Wt 167 lb (75.751 kg)  BMI 24.65  kg/m2 General: NAD Neck: No JVD, no thyromegaly or thyroid nodule.  Lungs: Clear to auscultation bilaterally with normal respiratory effort. CV: Nondisplaced PMI.  Heart regular S1/S2, no S3/S4, 1/6 HSM apex.  No peripheral edema.  No carotid bruit.  Normal pedal pulses.  Abdomen: Soft, nontender, no hepatosplenomegaly, no distention.  Skin: Intact without lesions or rashes.  Neurologic: Alert and oriented x 3.  Psych: Normal affect. Extremities: No clubbing or cyanosis.   Assessment/Plan:  1. CAD: Nonobstructive on prior cath.  Low risk Cardiolite in 10/14.  He has a pattern of atypical chest pain when he lies down after dinner that seems more like GERD than anything else.  I will have him try taking a second dose of omeprazole in the evening with supper.  Continue ASA 81 and statin.  2. Hyperlipidemia: Goal LDL < 70 with known  CAD.  Will get most recent lipids from PCP (from 12/15).   3. Murmur: Mild, had mild MR on echo in 4/15.    Followup in 1 year.   Loralie Champagne 01/04/2015

## 2015-01-04 NOTE — Patient Instructions (Signed)
Medication Instructions:  Increase omeprazole to 20mg  two times a day  Labwork: None today  Testing/Procedures: None today.  Follow-Up: Your physician wants you to follow-up in: 1 year with Dr Aundra Dubin. (April 2017) You will receive a reminder letter in the mail two months in advance. If you don't receive a letter, please call our office to schedule the follow-up appointment.

## 2015-02-18 ENCOUNTER — Emergency Department (HOSPITAL_COMMUNITY): Payer: Medicare Other

## 2015-02-18 ENCOUNTER — Encounter (HOSPITAL_COMMUNITY): Payer: Self-pay | Admitting: Cardiology

## 2015-02-18 ENCOUNTER — Telehealth: Payer: Self-pay | Admitting: Cardiology

## 2015-02-18 ENCOUNTER — Other Ambulatory Visit: Payer: Self-pay

## 2015-02-18 ENCOUNTER — Observation Stay (HOSPITAL_COMMUNITY)
Admission: EM | Admit: 2015-02-18 | Discharge: 2015-02-19 | Disposition: A | Discharge status: Home / Self Care | Payer: Medicare Other | Attending: Interventional Cardiology | Admitting: Interventional Cardiology

## 2015-02-18 ENCOUNTER — Other Ambulatory Visit (HOSPITAL_COMMUNITY): Payer: Self-pay

## 2015-02-18 DIAGNOSIS — I2510 Atherosclerotic heart disease of native coronary artery without angina pectoris: Secondary | ICD-10-CM | POA: Diagnosis not present

## 2015-02-18 DIAGNOSIS — I10 Essential (primary) hypertension: Secondary | ICD-10-CM | POA: Insufficient documentation

## 2015-02-18 DIAGNOSIS — I38 Endocarditis, valve unspecified: Secondary | ICD-10-CM | POA: Diagnosis not present

## 2015-02-18 DIAGNOSIS — R52 Pain, unspecified: Secondary | ICD-10-CM

## 2015-02-18 DIAGNOSIS — N400 Enlarged prostate without lower urinary tract symptoms: Secondary | ICD-10-CM | POA: Insufficient documentation

## 2015-02-18 DIAGNOSIS — E785 Hyperlipidemia, unspecified: Secondary | ICD-10-CM | POA: Diagnosis not present

## 2015-02-18 DIAGNOSIS — R011 Cardiac murmur, unspecified: Secondary | ICD-10-CM | POA: Diagnosis present

## 2015-02-18 DIAGNOSIS — R079 Chest pain, unspecified: Principal | ICD-10-CM

## 2015-02-18 DIAGNOSIS — R0789 Other chest pain: Secondary | ICD-10-CM | POA: Diagnosis not present

## 2015-02-18 LAB — CBC WITH DIFFERENTIAL/PLATELET
Basophils Absolute: 0.1 10*3/uL (ref 0.0–0.1)
Basophils Relative: 1 % (ref 0–1)
EOS ABS: 0.3 10*3/uL (ref 0.0–0.7)
EOS PCT: 6 % — AB (ref 0–5)
HEMATOCRIT: 41 % (ref 39.0–52.0)
HEMOGLOBIN: 14.3 g/dL (ref 13.0–17.0)
LYMPHS PCT: 23 % (ref 12–46)
Lymphs Abs: 1.3 10*3/uL (ref 0.7–4.0)
MCH: 31 pg (ref 26.0–34.0)
MCHC: 34.9 g/dL (ref 30.0–36.0)
MCV: 88.7 fL (ref 78.0–100.0)
Monocytes Absolute: 0.7 10*3/uL (ref 0.1–1.0)
Monocytes Relative: 11 % (ref 3–12)
NEUTROS PCT: 59 % (ref 43–77)
Neutro Abs: 3.5 10*3/uL (ref 1.7–7.7)
PLATELETS: 175 10*3/uL (ref 150–400)
RBC: 4.62 MIL/uL (ref 4.22–5.81)
RDW: 13.6 % (ref 11.5–15.5)
WBC: 5.9 10*3/uL (ref 4.0–10.5)

## 2015-02-18 LAB — CBC
HCT: 43.6 % (ref 39.0–52.0)
HEMOGLOBIN: 14.9 g/dL (ref 13.0–17.0)
MCH: 30.5 pg (ref 26.0–34.0)
MCHC: 34.2 g/dL (ref 30.0–36.0)
MCV: 89.2 fL (ref 78.0–100.0)
PLATELETS: 181 10*3/uL (ref 150–400)
RBC: 4.89 MIL/uL (ref 4.22–5.81)
RDW: 13.5 % (ref 11.5–15.5)
WBC: 6.4 10*3/uL (ref 4.0–10.5)

## 2015-02-18 LAB — CREATININE, SERUM: CREATININE: 0.86 mg/dL (ref 0.61–1.24)

## 2015-02-18 LAB — I-STAT CHEM 8, ED
BUN: 15 mg/dL (ref 6–20)
CALCIUM ION: 1.27 mmol/L (ref 1.13–1.30)
CHLORIDE: 103 mmol/L (ref 101–111)
Creatinine, Ser: 0.8 mg/dL (ref 0.61–1.24)
GLUCOSE: 111 mg/dL — AB (ref 65–99)
HCT: 42 % (ref 39.0–52.0)
Hemoglobin: 14.3 g/dL (ref 13.0–17.0)
Potassium: 4.2 mmol/L (ref 3.5–5.1)
Sodium: 136 mmol/L (ref 135–145)
TCO2: 20 mmol/L (ref 0–100)

## 2015-02-18 LAB — TROPONIN I
Troponin I: <0.03 ng/mL (ref <0.031)
Troponin I: <0.03 ng/mL (ref <0.031)

## 2015-02-18 LAB — TSH: TSH: 1.024 u[IU]/mL (ref 0.350–4.500)

## 2015-02-18 LAB — MAGNESIUM: Magnesium: 2.1 mg/dL (ref 1.7–2.4)

## 2015-02-18 MED ORDER — ADULT MULTIVITAMIN W/MINERALS CH
1.0000 | ORAL_TABLET | Freq: Every day | ORAL | Status: DC
  Administered 2015-02-19: 1 via ORAL
  Filled 2015-02-18 (×2): qty 1

## 2015-02-18 MED ORDER — PREGABALIN 100 MG PO CAPS
100.0000 mg | ORAL_CAPSULE | Freq: Every day | ORAL | Status: DC
  Administered 2015-02-18: 100 mg via ORAL
  Filled 2015-02-18: qty 1

## 2015-02-18 MED ORDER — ASPIRIN EC 81 MG PO TBEC
81.0000 mg | DELAYED_RELEASE_TABLET | Freq: Every day | ORAL | Status: DC
  Administered 2015-02-19: 81 mg via ORAL
  Filled 2015-02-18: qty 1

## 2015-02-18 MED ORDER — GLUCOSAMINE 750 MG PO TABS: ORAL_TABLET | Freq: Two times a day (BID) | ORAL | Status: DC

## 2015-02-18 MED ORDER — ONDANSETRON HCL 4 MG/2ML IJ SOLN: 4.0000 mg | Freq: Four times a day (QID) | INTRAMUSCULAR | Status: DC | PRN

## 2015-02-18 MED ORDER — CYCLOSPORINE 0.05 % OP EMUL
1.0000 [drp] | Freq: Two times a day (BID) | OPHTHALMIC | Status: DC
  Administered 2015-02-18 – 2015-02-19 (×2): 1 [drp] via OPHTHALMIC
  Filled 2015-02-18 (×3): qty 1

## 2015-02-18 MED ORDER — HEPARIN SODIUM (PORCINE) 5000 UNIT/ML IJ SOLN
5000.0000 [IU] | Freq: Three times a day (TID) | INTRAMUSCULAR | Status: DC
  Administered 2015-02-18 – 2015-02-19 (×2): 5000 [IU] via SUBCUTANEOUS
  Filled 2015-02-18 (×4): qty 1

## 2015-02-18 MED ORDER — METOPROLOL TARTRATE 12.5 MG HALF TABLET
12.5000 mg | ORAL_TABLET | Freq: Two times a day (BID) | ORAL | Status: DC
  Administered 2015-02-18 – 2015-02-19 (×2): 12.5 mg via ORAL
  Filled 2015-02-18 (×3): qty 1

## 2015-02-18 MED ORDER — FINASTERIDE 5 MG PO TABS
5.0000 mg | ORAL_TABLET | Freq: Every day | ORAL | Status: DC
  Administered 2015-02-19: 5 mg via ORAL
  Filled 2015-02-18: qty 1

## 2015-02-18 MED ORDER — ASPIRIN 81 MG PO CHEW
162.0000 mg | CHEWABLE_TABLET | Freq: Once | ORAL | Status: AC
  Administered 2015-02-18: 162 mg via ORAL
  Filled 2015-02-18: qty 2

## 2015-02-18 MED ORDER — ASPIRIN EC 81 MG PO TBEC: 81.0000 mg | DELAYED_RELEASE_TABLET | Freq: Every day | ORAL | Status: DC

## 2015-02-18 MED ORDER — NITROGLYCERIN 0.4 MG SL SUBL: 0.4000 mg | SUBLINGUAL_TABLET | SUBLINGUAL | Status: DC | PRN

## 2015-02-18 MED ORDER — PANTOPRAZOLE SODIUM 40 MG PO TBEC
40.0000 mg | DELAYED_RELEASE_TABLET | Freq: Two times a day (BID) | ORAL | Status: DC
  Administered 2015-02-18 – 2015-02-19 (×2): 40 mg via ORAL
  Filled 2015-02-18 (×2): qty 1

## 2015-02-18 MED ORDER — ASPIRIN 81 MG PO CHEW
81.0000 mg | CHEWABLE_TABLET | Freq: Once | ORAL | Status: AC
  Administered 2015-02-18: 81 mg via ORAL
  Filled 2015-02-18: qty 1

## 2015-02-18 MED ORDER — ALPRAZOLAM 0.25 MG PO TABS: 0.2500 mg | ORAL_TABLET | Freq: Two times a day (BID) | ORAL | Status: DC | PRN

## 2015-02-18 MED ORDER — PANTOPRAZOLE SODIUM 40 MG PO TBEC: 40.0000 mg | DELAYED_RELEASE_TABLET | Freq: Two times a day (BID) | ORAL | Status: DC

## 2015-02-18 MED ORDER — ACETAMINOPHEN 325 MG PO TABS
650.0000 mg | ORAL_TABLET | ORAL | Status: DC | PRN
  Administered 2015-02-19: 650 mg via ORAL
  Filled 2015-02-18: qty 2

## 2015-02-18 MED ORDER — SODIUM CHLORIDE 0.9 % IV SOLN: INTRAVENOUS | Status: DC

## 2015-02-18 MED ORDER — ATORVASTATIN CALCIUM 20 MG PO TABS
20.0000 mg | ORAL_TABLET | Freq: Every day | ORAL | Status: DC
  Administered 2015-02-19: 20 mg via ORAL
  Filled 2015-02-18: qty 1

## 2015-02-18 MED ORDER — ASPIRIN 325 MG PO TABS: 325.0000 mg | ORAL_TABLET | Freq: Every day | ORAL | Status: DC

## 2015-02-18 MED ORDER — ZOLPIDEM TARTRATE 5 MG PO TABS: 5.0000 mg | ORAL_TABLET | Freq: Every evening | ORAL | Status: DC | PRN

## 2015-02-18 NOTE — ED Notes (Signed)
Report attempted nurse states she will call back.

## 2015-02-18 NOTE — ED Provider Notes (Signed)
CSN: [id number]     Arrival date & time 02/18/15  0917 History   First MD Initiated Contact with Patient 02/18/15 [phone]     Chief Complaint  Patient presents with  . Chest Pain     (Consider location/radiation/quality/duration/timing/severity/associated sxs/prior Treatment) HPI Developed sudden onset left-sided chest pain radiating to left arm 5:30 PM yesterday, lasting one or 2 minutes followed by a constant dull left anterior chest discomfort, nonradiating. Pain is been constant throughout the night until he went to bed at 1 AM. He slept during the night. Awakened with same dull chest pain this morning. Discomfort is mild at present 3 on a scale of 1-10. Nothing makes symptoms better or worse. No associated nausea sweatiness or shortness of breath. He is treated himself with baby aspirin 1 tablet last night and 1 tablet today. Also with 5 sublingual nitroglycerin's, without relief. He called his cardiologist this morning who advised him to come to the emergency department Past Medical History  Diagnosis Date  . Diverticulosis   . Hepatitis, chronic     clinical grounds/no bx in the past  . Sixth nerve palsy 2001  . Impaired fasting glucose   . Hemorrhoids   . Allergic rhinitis   . Detached vitreous humor   . Lumbar radiculopathy   . BPH (benign prostatic hypertrophy)     Dr. Alinda Money  . Ingrown toenail   . Rotator cuff disorder   . Onychomycosis   . Arthritis   . Heart murmur   . Cervical disc disease   . Numbness and tingling in left arm   . Coronary artery disease     Patient developed exertional dyspnea. ETT-myoview (8/12) with 11 METS, normal LV EF, normal perfusion at rest and stress but elevated TID ratio. Coronary CT angiogram in 9/12 showed equivocal significant disease in the CFX. Cath in 10/12 showed EF 55-60%, 30% mid RCA, 40% distal RCA, 40% proximal LAD. ETT-Myoview (10/14): Diaphragmatic attenuation, no ischemia, EF 62%, normal study  . Diverticulosis    Past Surgical  History  Procedure Laterality Date  . Abnormal stress test    . Spine surgery  07/06/10 and 09/07/09    spinal fusion  . Rotator cuff repair  07/17/2007    left  . Hernia repair  1996    [facility]  . Hemorrhoid surgery  1965  . Appendectomy  1957  . Tonsillectomy and adenoidectomy  1947  . Back surgery  2010,2011    lumbar fusion-  . Cardiac catheterization    . Lipoma excision  09/05/2011    Procedure: EXCISION LIPOMA;  Surgeon: Haywood Lasso, MD;  Location: [facility] [facility];  Service: General;  Laterality: Left;  removal of 4 cm lipoma of back   Family History  Problem Relation Age of Onset  . Cancer Mother     multiple myeloma  . Stroke Father    History  Substance Use Topics  . Smoking status: Never Smoker   . Smokeless tobacco: Never Used  . Alcohol Use: Yes    Review of Systems  Constitutional: Negative.   HENT: Negative.   Respiratory: Negative.   Cardiovascular: Positive for chest pain.  Gastrointestinal: Negative.   Musculoskeletal: Negative.   Skin: Negative.   Neurological: Negative.   Psychiatric/Behavioral: Negative.   All other systems reviewed and are negative.     Allergies  Review of patient's allergies indicates no known allergies.  Home Medications   Prior to Admission medications   Medication Sig Start Date End  Date Taking? Authorizing Provider  aspirin 81 MG tablet Take 81 mg by mouth daily.      Historical Provider, MD  atorvastatin (LIPITOR) 20 MG tablet Take 1 tablet (20 mg total) by mouth daily. 12/15/12 01/04/15  Renella Cunas, MD  betamethasone valerate ointment (VALISONE) 0.1 % Apply 1 application topically as needed. 06/09/13   Historical Provider, MD  Calcium Citrate-Vitamin D (CITRACAL + D PO) Take 1 tablet by mouth daily.    Historical Provider, MD  Coenzyme Q10 (CO Q 10 PO) Take 400 mg by mouth daily.    Historical Provider, MD  finasteride (PROSCAR) 5 MG tablet Take 5 mg by mouth daily.      Historical Provider, MD   Glucosamine 750 MG TABS Take by mouth 2 (two) times daily.     Historical Provider, MD  ibuprofen (ADVIL,MOTRIN) 800 MG tablet Take 800 mg by mouth 3 (three) times daily.      Historical Provider, MD  Multiple Vitamin (MULTIVITAMIN) tablet Take 1 tablet by mouth daily.      Historical Provider, MD  nitroGLYCERIN (NITROSTAT) 0.4 MG SL tablet Place 1 tablet (0.4 mg total) under the tongue every 5 (five) minutes as needed for chest pain. 06/21/11 01/04/15  Larey Dresser, MD  omeprazole (PRILOSEC) 20 MG capsule Take 1 tablet by mouth two times a day 01/04/15   Larey Dresser, MD  pregabalin (LYRICA) 100 MG capsule Take 100 mg by mouth daily.     Historical Provider, MD  RESTASIS 0.05 % ophthalmic emulsion Place 1 drop into both eyes 2 (two) times daily.  03/11/13   Historical Provider, MD  Saw Palmetto, Serenoa repens, (SAW PALMETTO PO) Take 1 tablet by mouth 2 (two) times daily.    Historical Provider, MD   BP 155/94 mmHg  Temp(Src) 98 F (36.7 C) (Oral)  Resp 9  Ht _0  (1.778 m)  Wt 170 lb (77.111 kg)  BMI 24.39 kg/m2  SpO2 99% Physical Exam  Constitutional: He appears well-developed and well-nourished.  HENT:  Head: Normocephalic and atraumatic.  Eyes: Conjunctivae are normal. Pupils are equal, round, and reactive to light.  Neck: Neck supple. No tracheal deviation present. No thyromegaly present.  Cardiovascular: Normal rate and regular rhythm.   No murmur heard. Pulmonary/Chest: Effort normal and breath sounds normal.  Abdominal: Soft. Bowel sounds are normal. He exhibits no distension. There is no tenderness.  Musculoskeletal: Normal range of motion. He exhibits no edema or tenderness.  Neurological: He is alert. Coordination normal.  Skin: Skin is warm and dry. No rash noted.  Psychiatric: He has a normal mood and affect.  Nursing note and vitals reviewed.   ED Course  Procedures (including critical care time) Labs Review Labs Reviewed  TROPONIN I  CBC WITH  DIFFERENTIAL/PLATELET  I-STAT CHEM 8, ED    Imaging Review No results found.   EKG Interpretation None       Date: 02/18/2015  Rate: 65  Rhythm: normal sinus rhythm  QRS Axis: normal  Intervals: normal  ST/T Wave abnormalities: normal  Conduction Disutrbances: none  Narrative Interpretation: unremarkable    Aspirin ordered. Chest xray viewed by me. Results for orders placed or performed during the hospital encounter of 02/18/15  Troponin I  Result Value Ref Range   Troponin I <0.03 <0.031 ng/mL  CBC with Differential/Platelet  Result Value Ref Range   WBC 5.9 4.0 - 10.5 K/uL   RBC 4.62 4.22 - 5.81 MIL/uL   Hemoglobin 14.3 13.0 -  17.0 g/dL   HCT 41.0 39.0 - 52.0 %   MCV 88.7 78.0 - 100.0 fL   MCH 31.0 26.0 - 34.0 pg   MCHC 34.9 30.0 - 36.0 g/dL   RDW 13.6 11.5 - 15.5 %   Platelets 175 150 - 400 K/uL   Neutrophils Relative % 59 43 - 77 %   Neutro Abs 3.5 1.7 - 7.7 K/uL   Lymphocytes Relative 23 12 - 46 %   Lymphs Abs 1.3 0.7 - 4.0 K/uL   Monocytes Relative 11 3 - 12 %   Monocytes Absolute 0.7 0.1 - 1.0 K/uL   Eosinophils Relative 6 (H) 0 - 5 %   Eosinophils Absolute 0.3 0.0 - 0.7 K/uL   Basophils Relative 1 0 - 1 %   Basophils Absolute 0.1 0.0 - 0.1 K/uL  I-stat chem 8, ed  Result Value Ref Range   Sodium 136 135 - 145 mmol/L   Potassium 4.2 3.5 - 5.1 mmol/L   Chloride 103 101 - 111 mmol/L   BUN 15 6 - 20 mg/dL   Creatinine, Ser 0.80 0.61 - 1.24 mg/dL   Glucose, Bld 111 (H) 65 - 99 mg/dL   Calcium, Ion 1.27 1.13 - 1.30 mmol/L   TCO2 20 0 - 100 mmol/L   Hemoglobin 14.3 13.0 - 17.0 g/dL   HCT 42.0 39.0 - 52.0 %   Dg Chest Port 1 View  02/18/2015   CLINICAL DATA:  Initial encounter for pain rating into the left all arm.  EXAM: PORTABLE CHEST - 1 VIEW  COMPARISON:  Chest x-ray from 09/02/2009.  Dx  FINDINGS: The lungs are clear without focal infiltrate, edema, pneumothorax or pleural effusion. The cardiopericardial silhouette is within normal limits for size.  Imaged bony structures of the thorax are intact. Telemetry leads overlie the chest.  IMPRESSION: Normal portable view of the chest.   Electronically Signed   By: Misty Stanley M.D.   On: 02/18/2015 10:00      MDM  Symptoms highly atypical for acute coronary syndrome. [facility] called to evaluate patient Final diagnoses:  Pain   cardiology service evaluated patient in ED and arrange for inpatient stay Diagnosis chest pain      Orlie Dakin, MD 02/18/15 1257

## 2015-02-18 NOTE — ED Notes (Signed)
MD at bedside.cardiology  

## 2015-02-18 NOTE — ED Notes (Signed)
Pt presents with intermittent  "severe' pain left sternum running down left arm Pt cutrrently describes the pain as "tightness" that is persistent in nature.. Condition is acute in nature. Condition is made better by nitro. Condition is made worse by nothing. Pt denies any SHOB. N/V Diaphoresis.

## 2015-02-18 NOTE — Telephone Encounter (Signed)
Spoke with patient's wife - she states patient took another 2 nitro last night. Advised patient to go to ED. Wife asked about seeing a PA in office and I explained that since patient has taken 5 nitro in the last 12 hours that he will need to be seen in the hospital for evaluation. She states she will take him now and she is aware I will make cardiology, at hospital, aware that patient is on the way.  Wannetta Sender, cardmaster, notified)

## 2015-02-18 NOTE — ED Notes (Signed)
Meal tray ordered 

## 2015-02-18 NOTE — ED Notes (Signed)
[place] PHONE # [phone]. To call when pt gets a bed assignment

## 2015-02-18 NOTE — Telephone Encounter (Signed)
Patients wife came in this morning. She wanted to schedule an appointment with Dr Aundra Dubin for emergency appointment. Her husband has been dizzy all week, chest pain yesterday and numbness in the left arm. He took 3 nitro's yesterday. She stated that he did feel better today, but she is very concerned. She would like a call back ASAP as what the next step would be. Her husband was not was not present this morning. She can be reached on her cell phone [phone]

## 2015-02-18 NOTE — H&P (Signed)
Danny [name] is an 79 y.o. male.    Primary Cardiologist:Dr. Helane Gunther, MD  Chief Complaint: chest pain over 2 days with several NTG HPI: 79 yo with history of nonobstructive CAD presents for followup. He last had an ETT-Cardiolite in 10/14 that was probably normal with diaphragmatic attenuation. Echo in 4/15 showed EF 55-60%, mild MR. Cath in 2012 with EF 55-60%, RCA with 30% mid and 40% distal disease, LCX with mild luminal irregularities.  LAD 40% stenosis following second diag.  Mild luminal irregularities.   For a long time, he has been taking NTG due to chest burning about once a week. It is almost always after supper when he has laid down to go to bed (couple hours after eating). He takes omeprazole 20 mg B ID. No exertional chest pain. He walks 2 miles/day for exercise, no significant dyspnea unless he goes up a hill. OK with a flight of steps. Plays golf regularly.   Today presents to ED after having chest pain, tightness for 2 days.  After 4 hour drive back to [facility] he developed sudden chest pain with pain down Lt arm.  Last about 30 secs.  He took ntg total of 3 and after first pain was gone but tightness remained and stayed all day.  He took 2 more NTG before bed and went to sleep, could not really say tightness was gone but he slept without problems and had no tightness on waking this AM.  Once he brushed his teeth tightness returned.  NTG has not relieved this pain.  He did take his increased dose of prilosec.  No associated symptoms of nausea or  SOB, no diaphoresis.       EKG SR 1st degree AV block no changes from April EKG.  Troponin negative. Continues with tightness.  Past Medical History  Diagnosis Date  . Diverticulosis   . Hepatitis, chronic     clinical grounds/no bx in the past  . Sixth nerve palsy 2001  . Impaired fasting glucose   . Hemorrhoids   . Allergic rhinitis   . Detached vitreous humor   . Lumbar  radiculopathy   . BPH (benign prostatic hypertrophy)     Dr. Alinda Money  . Ingrown toenail   . Rotator cuff disorder   . Onychomycosis   . Arthritis   . Heart murmur   . Cervical disc disease   . Numbness and tingling in left arm   . Coronary artery disease     Patient developed exertional dyspnea. ETT-myoview (8/12) with 11 METS, normal LV EF, normal perfusion at rest and stress but elevated TID ratio. Coronary CT angiogram in 9/12 showed equivocal significant disease in the CFX. Cath in 10/12 showed EF 55-60%, 30% mid RCA, 40% distal RCA, 40% proximal LAD. ETT-Myoview (10/14): Diaphragmatic attenuation, no ischemia, EF 62%, normal study  . Diverticulosis     Past Surgical History  Procedure Laterality Date  . Abnormal stress test    . Spine surgery  07/06/10 and 09/07/09    spinal fusion  . Rotator cuff repair  07/17/2007    left  . Hernia repair  1996    [facility]H  . Hemorrhoid surgery  1965  . Appendectomy  1957  . Tonsillectomy and adenoidectomy  1947  . Back surgery  2010,2011    lumbar fusion-  . Cardiac catheterization    . Lipoma excision  09/05/2011    Procedure: EXCISION LIPOMA;  Surgeon:  Haywood Lasso, MD;  Location: [facility];  Service: General;  Laterality: Left;  removal of 4 cm lipoma of back    Family History  Problem Relation Age of Onset  . Cancer Mother     multiple myeloma  . Stroke Father    Social History:  reports that he has never smoked. He has never used smokeless tobacco. He reports that he drinks alcohol. He reports that he does not use illicit drugs.  Allergies: No Known Allergies  OUTPT MEDS: No current facility-administered medications on file prior to encounter.   Current Outpatient Prescriptions on File Prior to Encounter  Medication Sig Dispense Refill  . aspirin 81 MG tablet Take 81 mg by mouth daily.      [name] atorvastatin (LIPITOR) 20 MG tablet Take 1 tablet (20 mg total) by mouth daily. 30 tablet 0  . Calcium  Citrate-Vitamin D (CITRACAL + D PO) Take 1 tablet by mouth daily.    . Coenzyme Q10 (CO Q 10 PO) Take 400 mg by mouth daily.    . finasteride (PROSCAR) 5 MG tablet Take 5 mg by mouth daily.      . Glucosamine 750 MG TABS Take by mouth 2 (two) times daily.     [name] ibuprofen (ADVIL,MOTRIN) 800 MG tablet Take 800 mg by mouth 3 (three) times daily.      . Multiple Vitamin (MULTIVITAMIN) tablet Take 1 tablet by mouth daily.      . nitroGLYCERIN (NITROSTAT) 0.4 MG SL tablet Place 1 tablet (0.4 mg total) under the tongue every 5 (five) minutes as needed for chest pain. 90 tablet 3  . omeprazole (PRILOSEC) 20 MG capsule Take 1 tablet by mouth two times a day 30 capsule 11  . pregabalin (LYRICA) 100 MG capsule Take 100 mg by mouth daily.     . RESTASIS 0.05 % ophthalmic emulsion Place 1 drop into both eyes 2 (two) times daily.     . Saw Palmetto, Serenoa repens, (SAW PALMETTO PO) Take 1 tablet by mouth 2 (two) times daily.      Results for orders placed or performed during the hospital encounter of 02/18/15 (from the past 48 hour(s))  Troponin I     Status: None   Collection Time: 02/18/15  9:45 AM  Result Value Ref Range   Troponin I <0.03 <0.031 ng/mL    Comment:        NO INDICATION OF MYOCARDIAL INJURY.   CBC with Differential/Platelet     Status: Abnormal   Collection Time: 02/18/15  9:45 AM  Result Value Ref Range   WBC 5.9 4.0 - 10.5 K/uL   RBC 4.62 4.22 - 5.81 MIL/uL   Hemoglobin 14.3 13.0 - 17.0 g/dL   HCT 41.0 39.0 - 52.0 %   MCV 88.7 78.0 - 100.0 fL   MCH 31.0 26.0 - 34.0 pg   MCHC 34.9 30.0 - 36.0 g/dL   RDW 13.6 11.5 - 15.5 %   Platelets 175 150 - 400 K/uL   Neutrophils Relative % 59 43 - 77 %   Neutro Abs 3.5 1.7 - 7.7 K/uL   Lymphocytes Relative 23 12 - 46 %   Lymphs Abs 1.3 0.7 - 4.0 K/uL   Monocytes Relative 11 3 - 12 %   Monocytes Absolute 0.7 0.1 - 1.0 K/uL   Eosinophils Relative 6 (H) 0 - 5 %   Eosinophils Absolute 0.3 0.0 - 0.7 K/uL   Basophils Relative 1 0 - 1 %  Basophils Absolute 0.1 0.0 - 0.1 K/uL  I-stat chem 8, ed     Status: Abnormal   Collection Time: 02/18/15  9:52 AM  Result Value Ref Range   Sodium 136 135 - 145 mmol/L   Potassium 4.2 3.5 - 5.1 mmol/L   Chloride 103 101 - 111 mmol/L   BUN 15 6 - 20 mg/dL   Creatinine, Ser 0.80 0.61 - 1.24 mg/dL   Glucose, Bld 111 (H) 65 - 99 mg/dL   Calcium, Ion 1.27 1.13 - 1.30 mmol/L   TCO2 20 0 - 100 mmol/L   Hemoglobin 14.3 13.0 - 17.0 g/dL   HCT 42.0 39.0 - 52.0 %   Dg Chest Port 1 View  02/18/2015   CLINICAL DATA:  Initial encounter for pain rating into the left all arm.  EXAM: PORTABLE CHEST - 1 VIEW  COMPARISON:  Chest x-ray from 09/02/2009.  Dx  FINDINGS: The lungs are clear without focal infiltrate, edema, pneumothorax or pleural effusion. The cardiopericardial silhouette is within normal limits for size. Imaged bony structures of the thorax are intact. Telemetry leads overlie the chest.  IMPRESSION: Normal portable view of the chest.   Electronically Signed   By: Misty Stanley M.D.   On: 02/18/2015 10:00    ROS: General:no colds or fevers, no weight changes Skin:no rashes or ulcers HEENT:no blurred vision, no congestion, cataract surgery in Nov, 2015 and has had visual problems since he believe makes him dizzy, he accnot determine if room spinning or lightheaded.   CV:see HPI PUL:see HPI GI:no diarrhea constipation or melena, no indigestion GU:no hematuria, no dysuria MS:+ hx or arthritis on ibuprofen, no claudication, no calf pain, + back surgery - Lumbar and has occ pain down rt leg and some tingling.  Neuro:no syncope, +dizziness as stated. Endo:no diabetes, no thyroid disease   Blood pressure 133/91, pulse 63, temperature 98 F (36.7 C), temperature source Oral, resp. rate 22, height _0  (1.778 m), weight 170 lb (77.111 kg), SpO2 98 %. PE: General:Pleasant affect, NAD Skin:Warm and dry, brisk capillary refill HEENT:normocephalic, sclera clear, mucus membranes moist Neck:supple,  no JVD, no bruits  Heart:S1S2 RRR with soft 1-4/4 systolic murmur, no gallup, rub or click Lungs:clear without rales, rhonchi, or wheezes YJE:HUDJ, non tender, + BS, do not palpate liver spleen or masses Ext:no lower ext edema, 2+ pedal pulses, 2+ radial pulses Neuro:alert and oriented X 3, MAE, follows commands, + facial symmetry    Assessment/Plan Principal Problem:   Chest pain at rest, initial troponin negative.  Chest tightness continues - will plan for obs admit and schedule out pt stress myoview unless enzymes positive.  Will add VTE prophylaxis heparin and change prilosec to protonix BID GI may be a component with Ibuprofen 800 2-3 times a day. Dr. Tamala Julian has evaluated.  I added low dose BB with HTN and HR 60s.  We held ibuprofen.    Active Problems:   CAD (coronary artery disease) non obstructive with cath in 2012 and normal Ef   Hyperlipidemia on statin his PCP follows his lipids, in 2013 non hDL 78- will check.   Heart murmur, systolic    [facility] Nurse Practitioner Certified [place] Pager [phone] or after 5pm or weekends call [phone] 02/18/2015, 11:55 AM    p

## 2015-02-19 ENCOUNTER — Other Ambulatory Visit: Payer: Self-pay | Admitting: Physician Assistant

## 2015-02-19 DIAGNOSIS — R079 Chest pain, unspecified: Secondary | ICD-10-CM

## 2015-02-19 LAB — LIPID PANEL
Cholesterol: 121 mg/dL (ref 0–200)
HDL: 49 mg/dL (ref >40)
LDL Cholesterol: 56 mg/dL (ref 0–99)
Total CHOL/HDL Ratio: 2.5 RATIO
Triglycerides: 80 mg/dL (ref <150)
VLDL: 16 mg/dL (ref 0–40)

## 2015-02-19 LAB — CBC
HCT: 41.4 % (ref 39.0–52.0)
Hemoglobin: 14.2 g/dL (ref 13.0–17.0)
MCH: 30.3 pg (ref 26.0–34.0)
MCHC: 34.3 g/dL (ref 30.0–36.0)
MCV: 88.5 fL (ref 78.0–100.0)
Platelets: 182 10*3/uL (ref 150–400)
RBC: 4.68 MIL/uL (ref 4.22–5.81)
RDW: 13.6 % (ref 11.5–15.5)
WBC: 7.4 10*3/uL (ref 4.0–10.5)

## 2015-02-19 LAB — BASIC METABOLIC PANEL
ANION GAP: 8 (ref 5–15)
BUN: 15 mg/dL (ref 6–20)
CHLORIDE: 104 mmol/L (ref 101–111)
CO2: 26 mmol/L (ref 22–32)
Calcium: 9.6 mg/dL (ref 8.9–10.3)
Creatinine, Ser: 0.93 mg/dL (ref 0.61–1.24)
GFR calc Af Amer: >60 mL/min (ref >60)
GFR calc non Af Amer: >60 mL/min (ref >60)
GLUCOSE: 97 mg/dL (ref 65–99)
POTASSIUM: 4.1 mmol/L (ref 3.5–5.1)
Sodium: 138 mmol/L (ref 135–145)

## 2015-02-19 LAB — HEMOGLOBIN A1C
HEMOGLOBIN A1C: 6 % — AB (ref 4.8–5.6)
Mean Plasma Glucose: 126 mg/dL

## 2015-02-19 LAB — TROPONIN I: Troponin I: <0.03 ng/mL (ref <0.031)

## 2015-02-19 MED ORDER — ATORVASTATIN CALCIUM 20 MG PO TABS: 20.0000 mg | ORAL_TABLET | Freq: Every day | ORAL | Status: DC

## 2015-02-19 MED ORDER — METOPROLOL TARTRATE 25 MG PO TABS: 12.5000 mg | ORAL_TABLET | Freq: Two times a day (BID) | ORAL | Status: DC

## 2015-02-19 MED ORDER — NITROGLYCERIN 0.4 MG SL SUBL: 0.4000 mg | SUBLINGUAL_TABLET | SUBLINGUAL | Status: DC | PRN

## 2015-02-19 NOTE — Progress Notes (Signed)
Patient alert oriented, denies pain, no shortness of breath.wife at bedside. D/C instruction explain and given to the patient and the wife. Patient and family verbalized understanding. D/c patient per order.

## 2015-02-19 NOTE — Progress Notes (Signed)
Patient ID: Danny [name], male   DOB: 1936-09-05, 79 y.o.   MRN: [id number]    Primary cardiologist:  Subjective:    No complaints  Objective:   Temp:  [97.7 F (36.5 C)-98 F (36.7 C)] 98 F (36.7 C) (06/04 0952) Pulse Rate:  [59-72] 62 (06/04 0448) Resp:  [11-22] 18 (06/04 0448) BP: (107-149)/(75-103) 107/75 mmHg (06/04 0952) SpO2:  [95 %-100 %] 95 % (06/04 0952) Weight:  [159 lb 4.8 oz (72.258 kg)-163 lb 6.4 oz (74.118 kg)] 159 lb 4.8 oz (72.258 kg) (06/04 0448) Last BM Date: 02/18/15  Filed Weights   02/18/15 0931 02/18/15 1512 02/19/15 0448  Weight: 170 lb (77.111 kg) 163 lb 6.4 oz (74.118 kg) 159 lb 4.8 oz (72.258 kg)    Intake/Output Summary (Last 24 hours) at 02/19/15 1013 Last data filed at 02/19/15 0844  Gross per 24 hour  Intake   1200 ml  Output   1676 ml  Net   -476 ml    Telemetry: NSR  Exam:  General: NAD   Resp: CTAB  Cardiac: RRR, 2/6 systolic murmur at apex, no JVD  GI: abdomen soft, NT, ND  MSK: no LE edema  Neuro: no focal deficits    Lab Results:  Basic Metabolic Panel:  Recent Labs Lab 02/18/15 0952 02/18/15 1655 02/19/15 0342  NA 136  --  138  K 4.2  --  4.1  CL 103  --  104  CO2  --   --  26  GLUCOSE 111*  --  97  BUN 15  --  15  CREATININE 0.80 0.86 0.93  CALCIUM  --   --  9.6  MG  --  2.1  --     Liver Function Tests: No results for input(s): AST, ALT, ALKPHOS, BILITOT, PROT, ALBUMIN in the last 168 hours.  CBC:  Recent Labs Lab 02/18/15 0945 02/18/15 0952 02/18/15 1655 02/19/15 0342  WBC 5.9  --  6.4 7.4  HGB 14.3 14.3 14.9 14.2  HCT 41.0 42.0 43.6 41.4  MCV 88.7  --  89.2 88.5  PLT 175  --  181 182    Cardiac Enzymes:  Recent Labs Lab 02/18/15 1655 02/18/15 2122 02/19/15 0342  TROPONINI <0.03 <0.03 <0.03    BNP: No results for input(s): PROBNP in the last 8760 hours.  Coagulation: No results for input(s): INR in the last 168 hours.  ECG:   Medications:   Scheduled  Medications: . aspirin EC  81 mg Oral Daily  . atorvastatin  20 mg Oral Daily  . cycloSPORINE  1 drop Both Eyes BID  . finasteride  5 mg Oral Daily  . heparin  5,000 Units Subcutaneous 3 times per day  . metoprolol tartrate  12.5 mg Oral BID  . multivitamin with minerals  1 tablet Oral Daily  . pantoprazole  40 mg Oral BID  . pregabalin  100 mg Oral Daily     Infusions: . sodium chloride       PRN Medications:  acetaminophen, ALPRAZolam, nitroGLYCERIN, ondansetron (ZOFRAN) IV, zolpidem     Assessment/Plan    79 yo male hx of non-obstructive CAD by cath 2012, negative exercise cardiolite 06/2013 admitted with chest pain.  1. Chest pain - burning like chest pain, often after meals and after laying down. No exertional chest pain, walks 2 miles daily without symptoms - no evidence of ACS. Trop neg x 3, EKG without ischemic changes - he has been started on lopressor this admit as  antianginal. Will plan for outpatient exercise cardiolite, will need to hold lopressor day of test. Will need f/u in 2 weeks. Plan for discharge today.         Carlyle Dolly, M.D.

## 2015-02-19 NOTE — Discharge Summary (Signed)
Physician Discharge Summary    Cardiologist:  Aundra Dubin  Patient ID: Danny [name] MRN: [id number] DOB/AGE: 1936-06-04 79 y.o.  Admit date: 02/18/2015 Discharge date: 02/19/2015  Admission Diagnoses: Chest pain  Discharge Diagnoses:  Principal Problem:   Chest pain at rest Active Problems:   CAD (coronary artery disease)   Hyperlipidemia   Heart murmur, systolic   Discharged Condition: stable  Hospital Course:   79 yo with history of nonobstructive CAD presents for followup. He last had an ETT-Cardiolite in 10/14 that was probably normal with diaphragmatic attenuation. Echo in 4/15 showed EF 55-60%, mild MR. Cath in 2012 with EF 55-60%, RCA with 30% mid and 40% distal disease, LCX with mild luminal irregularities. LAD 40% stenosis following second diag. Mild luminal irregularities.   For a long time, he has been taking NTG due to chest burning about once a week. It is almost always after supper when he has laid down to go to bed (couple hours after eating). He takes omeprazole 20 mg BID. No exertional chest pain. He walks 2 miles/day for exercise, no significant dyspnea unless he goes up a hill. OK with a flight of steps. Plays golf regularly.   Today presents to ED after having chest pain, tightness for 2 days. After 4 hour drive back to [facility] he developed sudden chest pain with pain down Lt arm. Last about 30 secs. He took ntg total of 3 and after first pain was gone but tightness remained and stayed all day. He took 2 more NTG before bed and went to sleep, could not really say tightness was gone but he slept without problems and had no tightness on waking this AM. Once he brushed his teeth tightness returned. NTG has not relieved this pain. He did take his increased dose of prilosec. No associated symptoms of nausea or SOB, no diaphoresis.   EKG SR 1st degree AV block no changes from April EKG. Troponin negative. Continues with tightness.    The patient was  admitted for observation and ruled out for MI.  CXR was negative.  He was started on lopressor 12.5 bid and is already on a statin.  It was recommended he stop the ibuprofen for a while and see if that helps.  He will be scheduled for an out patient treadmill cardiolite.  Lipids are very very controlled.  The patient was seen by Dr. Harl Bowie who felt he was stable for DC home.   Consults: None  Significant Diagnostic Studies: Cardiac Panel (last 3 results)  Recent Labs  02/18/15 1655 02/18/15 2122 02/19/15 0342  TROPONINI <0.03 <0.03 <0.03   Lipid Panel     Component Value Date/Time   CHOL 121 02/19/2015 0342   TRIG 80 02/19/2015 0342   HDL 49 02/19/2015 0342   CHOLHDL 2.5 02/19/2015 0342   VLDL 16 02/19/2015 0342   LDLCALC 56 02/19/2015 0342    EXAM: PORTABLE CHEST - 1 VIEW  COMPARISON: Chest x-ray from 09/02/2009. Dx  FINDINGS: The lungs are clear without focal infiltrate, edema, pneumothorax or pleural effusion. The cardiopericardial silhouette is within normal limits for size. Imaged bony structures of the thorax are intact. Telemetry leads overlie the chest.  IMPRESSION: Normal portable view of the chest.   Treatments:  See above  Discharge Exam: Blood pressure 107/75, pulse 62, temperature 98 F (36.7 C), temperature source Oral, resp. rate 18, height 5\' 10"  (1.778 m), weight 159 lb 4.8 oz (72.258 kg), SpO2 95 %.  Disposition: 01-Home or Self Care  Discharge Instructions    Diet - low sodium heart healthy    Complete by:  As directed      Increase activity slowly    Complete by:  As directed             Medication List    STOP taking these medications        ibuprofen 800 MG tablet  Commonly known as:  ADVIL,MOTRIN      TAKE these medications        aspirin 81 MG tablet  Take 81 mg by mouth daily.     atorvastatin 20 MG tablet  Commonly known as:  LIPITOR  Take 1 tablet (20 mg total) by mouth daily.     CITRACAL + D PO  Take  1 tablet by mouth daily.     CO Q 10 PO  Take 400 mg by mouth daily.     finasteride 5 MG tablet  Commonly known as:  PROSCAR  Take 5 mg by mouth daily.     Glucosamine 750 MG Tabs  Take by mouth 2 (two) times daily.     metoprolol tartrate 25 MG tablet  Commonly known as:  LOPRESSOR  Take 0.5 tablets (12.5 mg total) by mouth 2 (two) times daily.     multivitamin tablet  Take 1 tablet by mouth daily.     nitroGLYCERIN 0.4 MG SL tablet  Commonly known as:  NITROSTAT  Place 1 tablet (0.4 mg total) under the tongue every 5 (five) minutes as needed for chest pain.     omeprazole 20 MG capsule  Commonly known as:  PRILOSEC  Take 1 tablet by mouth two times a day     pregabalin 100 MG capsule  Commonly known as:  LYRICA  Take 100 mg by mouth daily.     RESTASIS 0.05 % ophthalmic emulsion  Generic drug:  cycloSPORINE  Place 1 drop into both eyes 2 (two) times daily.     SAW PALMETTO PO  Take 1 tablet by mouth 2 (two) times daily.       Follow-up Information    Follow up with Horton Finer, MD.   Specialty:  Internal Medicine   Contact information:   [phone] [address] [phone]       Follow up with Stress test.   Why:  The office will call you to schedule the stress test.   Contact information:   [address] [place] [address] [phone]      Greater than 30 minutes was spent completing the patient's discharge.   SignedTarri Fuller, [place] 02/19/2015, 11:11 AM

## 2015-02-23 DIAGNOSIS — L820 Inflamed seborrheic keratosis: Secondary | ICD-10-CM | POA: Diagnosis not present

## 2015-02-23 DIAGNOSIS — B372 Candidiasis of skin and nail: Secondary | ICD-10-CM | POA: Diagnosis not present

## 2015-02-23 DIAGNOSIS — L309 Dermatitis, unspecified: Secondary | ICD-10-CM | POA: Diagnosis not present

## 2015-03-07 DIAGNOSIS — R200 Anesthesia of skin: Secondary | ICD-10-CM | POA: Diagnosis not present

## 2015-03-10 ENCOUNTER — Telehealth (HOSPITAL_COMMUNITY): Payer: Self-pay | Admitting: *Deleted

## 2015-03-10 NOTE — Telephone Encounter (Signed)
Left message on voicemail in reference to upcoming appointment scheduled for 03/15/15. Phone number given for a call back so details instructions can be given. Hubbard Robinson, RN

## 2015-03-11 ENCOUNTER — Telehealth (HOSPITAL_COMMUNITY): Payer: Self-pay

## 2015-03-11 DIAGNOSIS — H04123 Dry eye syndrome of bilateral lacrimal glands: Secondary | ICD-10-CM | POA: Diagnosis not present

## 2015-03-11 DIAGNOSIS — Z961 Presence of intraocular lens: Secondary | ICD-10-CM | POA: Diagnosis not present

## 2015-03-11 DIAGNOSIS — H52203 Unspecified astigmatism, bilateral: Secondary | ICD-10-CM | POA: Diagnosis not present

## 2015-03-11 NOTE — Telephone Encounter (Signed)
Patient given detailed instructions per Myocardial Perfusion Study Information Sheet for test on 03-15-2015 at 9:15am. Patient Notified to arrive 15 minutes early, and that it is imperative to arrive on time for appointment to keep from having the test rescheduled. Patient verbalized understanding. Oletta Lamas, Braedyn Kauk A

## 2015-03-15 ENCOUNTER — Ambulatory Visit (HOSPITAL_COMMUNITY): Payer: Medicare Other | Attending: Internal Medicine

## 2015-03-15 DIAGNOSIS — R079 Chest pain, unspecified: Secondary | ICD-10-CM

## 2015-03-15 MED ORDER — TECHNETIUM TC 99M SESTAMIBI GENERIC - CARDIOLITE
33.0000 | Freq: Once | INTRAVENOUS | Status: AC | PRN
  Administered 2015-03-15: 33 via INTRAVENOUS

## 2015-03-15 MED ORDER — TECHNETIUM TC 99M SESTAMIBI GENERIC - CARDIOLITE
10.0000 | Freq: Once | INTRAVENOUS | Status: AC | PRN
  Administered 2015-03-15: 10 via INTRAVENOUS

## 2015-03-16 ENCOUNTER — Encounter: Payer: Self-pay | Admitting: Cardiology

## 2015-03-16 ENCOUNTER — Ambulatory Visit (INDEPENDENT_AMBULATORY_CARE_PROVIDER_SITE_OTHER): Payer: Medicare Other | Admitting: Cardiology

## 2015-03-16 VITALS — BP 132/89 | HR 60 | Ht 69.0 in | Wt 162.0 lb

## 2015-03-16 DIAGNOSIS — R079 Chest pain, unspecified: Secondary | ICD-10-CM | POA: Diagnosis not present

## 2015-03-16 DIAGNOSIS — M1990 Unspecified osteoarthritis, unspecified site: Secondary | ICD-10-CM

## 2015-03-16 DIAGNOSIS — E785 Hyperlipidemia, unspecified: Secondary | ICD-10-CM

## 2015-03-16 DIAGNOSIS — I2510 Atherosclerotic heart disease of native coronary artery without angina pectoris: Secondary | ICD-10-CM

## 2015-03-16 LAB — MYOCARDIAL PERFUSION IMAGING
CHL CUP NUCLEAR SDS: 1
CHL CUP NUCLEAR SRS: 1
CHL RATE OF PERCEIVED EXERTION: 17
CSEPHR: 91 %
Estimated workload: 10.1 METS
Exercise duration (min): 9 min
Exercise duration (sec): 0 s
LV dias vol: 83 mL
LV sys vol: 28 mL
MPHR: 143 {beats}/min
NUC STRESS TID: 0.82
Peak HR: 131 {beats}/min
RATE: 0.34
Rest HR: 59 {beats}/min
SSS: 2

## 2015-03-16 MED ORDER — IBUPROFEN 800 MG PO TABS: 800.0000 mg | ORAL_TABLET | Freq: Three times a day (TID) | ORAL | Status: DC

## 2015-03-16 NOTE — Progress Notes (Signed)
03/16/2015 Danny [name]   December 28, 1935  [id number]  Primary Physician Dorian Heckle, MD Primary Cardiologist: Dr Aundra Dubin  HPI:  79 yo with history of nonobstructive CAD by cath in 2012, presented to the ED 02/19/15 with chest pain. Cath in 2012 with EF 55-60%, RCA with 30% mid and 40% distal disease, LCX with mild luminal irregularities. LAD 40% stenosis following second diag. Mild luminal irregularities. He last had an ETT-Cardiolite in 10/14 that was probably normal with diaphragmatic attenuation. Echo in 4/15 showed EF 55-60%, mild MR. For a long time, he has been taking NTG due to chest burning about once a week. It is almost always after supper when he has laid down to go to bed (couple hours after eating). He takes omeprazole 20 mg BID. No exertional chest pain. He walks 2 miles/day for exercise, no significant dyspnea unless he goes up a hill. OK with a flight of steps. Plays golf regularly. EKG showed SR 1st degree AV block no changes from April EKG. Troponin negative. The patient was admitted for observation and ruled out for MI. CXR was negative. He was started on lopressor 12.5 bid and is already on a statin. He had an OP exercise Myoview 03/15/15 that was read as normal. In retrospect he feels his symptoms on admission were from some chronic shoulder issues he has had since surgery.    Current Outpatient Prescriptions  Medication Sig Dispense Refill  . aspirin 81 MG tablet Take 81 mg by mouth daily.      [name] atorvastatin (LIPITOR) 20 MG tablet Take 1 tablet (20 mg total) by mouth daily. 30 tablet 11  . Calcium Citrate-Vitamin D (CITRACAL + D PO) Take 1 tablet by mouth daily.    . Coenzyme Q10 (CO Q 10 PO) Take 400 mg by mouth daily.    . finasteride (PROSCAR) 5 MG tablet Take 5 mg by mouth daily.      . Multiple Vitamin (MULTIVITAMIN) tablet Take 1 tablet by mouth daily.      . nitroGLYCERIN (NITROSTAT) 0.4 MG SL tablet Place 1 tablet (0.4 mg total) under  the tongue every 5 (five) minutes as needed for chest pain. 25 tablet 12  . omeprazole (PRILOSEC) 20 MG capsule Take 1 tablet by mouth two times a day 30 capsule 11  . pregabalin (LYRICA) 100 MG capsule Take 100 mg by mouth daily. Take one or two tabs by mouth at bed-time    . RESTASIS 0.05 % ophthalmic emulsion Place 1 drop into both eyes 2 (two) times daily.     . Saw Palmetto, Serenoa repens, (SAW PALMETTO PO) Take 1 tablet by mouth 2 (two) times daily.    [name] ibuprofen (ADVIL,MOTRIN) 800 MG tablet Take 1 tablet (800 mg total) by mouth 3 (three) times daily. 30 tablet 0   No current facility-administered medications for this visit.    No Known Allergies  History   Social History  . Marital Status: Married    Spouse Name: [name]  . Number of Children: [name]  . Years of Education: [name]   Occupational History  . 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 Smoking status: Never Smoker   . Smokeless tobacco: Never Used  . Alcohol Use: Yes  . Drug Use: No  . Sexual Activity: Not on file   Other Topics Concern  . Not on file   Social History Narrative     Review of  Systems: General: negative for chills, fever, night sweats or weight changes.  Cardiovascular: negative for chest pain, dyspnea on exertion, edema, orthopnea, palpitations, paroxysmal nocturnal dyspnea or shortness of breath Dermatological: negative for rash Respiratory: negative for cough or wheezing Urologic: negative for hematuria Abdominal: negative for nausea, vomiting, diarrhea, bright red blood per rectum, melena, or hematemesis Neurologic: negative for visual changes, syncope, or dizziness All other systems reviewed and are otherwise negative except as noted above.    Blood pressure 132/89, pulse 60, height 5\' 9"  (1.753 m), weight 162 lb (73.483 kg), SpO2 98 %.  General appearance: alert, cooperative and no distress Neck: no carotid bruit and no JVD Lungs: clear to  auscultation bilaterally Heart: regular rate and rhythm Abdomen: soft, non-tender; bowel sounds normal; no masses,  no organomegaly Extremities: extremities normal, atraumatic, no cyanosis or edema Pulses: 2+ and symmetric Skin: Skin color, texture, turgor normal. No rashes or lesions Neurologic: Grossly normal    ASSESSMENT AND PLAN:   Chest pain at rest Recent admission for chest pain, MI r/o, Myoview normal  CAD (coronary artery disease) Non obstructive CAD 2012- 30-40% RCA, 40% LAD  Hyperlipidemia LDL 56  Arthritis He takes daily NSAIDs    PLAN  He asked if he could stop Metoprolol and I told him that would be OK. I suggested he continue Lipitor and ASA 81 mg. He asked about Ibuprofen (he was told to stop this in the hospital). He says he really can't get around without this. I told him he could resume it if he felt he could not do without it. He may want to have a BMP twice a year and he should stay on his PPI. F/U one year   Erlene Quan PA-C 03/16/2015 2:02 PM

## 2015-03-16 NOTE — Assessment & Plan Note (Signed)
Non obstructive CAD 2012- 30-40% RCA, 40% LAD

## 2015-03-16 NOTE — Patient Instructions (Signed)
Medication Instructions:  Please stop your Lopressor.  Continue all other medications as listed.  Follow-Up: Follow up in 1 year with Dr. Aundra Dubin.  You will receive a letter in the mail 2 months before you are due.  Please call us when you receive this letter to schedule your follow up appointment.  Thank you for choosing [facility]!!

## 2015-03-16 NOTE — Assessment & Plan Note (Signed)
He takes daily NSAIDs

## 2015-03-16 NOTE — Assessment & Plan Note (Signed)
Recent admission for chest pain, MI r/o, Myoview normal

## 2015-03-16 NOTE — Assessment & Plan Note (Signed)
LDL 56

## 2015-03-22 ENCOUNTER — Other Ambulatory Visit: Payer: Self-pay | Admitting: Neurological Surgery

## 2015-03-22 DIAGNOSIS — R200 Anesthesia of skin: Secondary | ICD-10-CM

## 2015-03-26 ENCOUNTER — Ambulatory Visit
Admission: RE | Admit: 2015-03-26 | Discharge: 2015-03-26 | Disposition: A | Discharge status: Home / Self Care | Payer: Medicare Other | Source: Ambulatory Visit | Attending: Neurological Surgery | Admitting: Neurological Surgery

## 2015-03-26 DIAGNOSIS — M4186 Other forms of scoliosis, lumbar region: Secondary | ICD-10-CM | POA: Diagnosis not present

## 2015-03-26 DIAGNOSIS — M47817 Spondylosis without myelopathy or radiculopathy, lumbosacral region: Secondary | ICD-10-CM | POA: Diagnosis not present

## 2015-03-26 DIAGNOSIS — M5137 Other intervertebral disc degeneration, lumbosacral region: Secondary | ICD-10-CM | POA: Diagnosis not present

## 2015-03-26 DIAGNOSIS — R200 Anesthesia of skin: Secondary | ICD-10-CM

## 2015-03-28 DIAGNOSIS — R200 Anesthesia of skin: Secondary | ICD-10-CM | POA: Diagnosis not present

## 2015-03-30 ENCOUNTER — Other Ambulatory Visit: Payer: Medicare Other

## 2015-05-11 DIAGNOSIS — Z85820 Personal history of malignant melanoma of skin: Secondary | ICD-10-CM | POA: Diagnosis not present

## 2015-05-11 DIAGNOSIS — L814 Other melanin hyperpigmentation: Secondary | ICD-10-CM | POA: Diagnosis not present

## 2015-05-11 DIAGNOSIS — D225 Melanocytic nevi of trunk: Secondary | ICD-10-CM | POA: Diagnosis not present

## 2015-05-11 DIAGNOSIS — Z08 Encounter for follow-up examination after completed treatment for malignant neoplasm: Secondary | ICD-10-CM | POA: Diagnosis not present

## 2015-08-03 DIAGNOSIS — N401 Enlarged prostate with lower urinary tract symptoms: Secondary | ICD-10-CM | POA: Diagnosis not present

## 2015-08-03 DIAGNOSIS — R3916 Straining to void: Secondary | ICD-10-CM | POA: Diagnosis not present

## 2015-08-25 DIAGNOSIS — R198 Other specified symptoms and signs involving the digestive system and abdomen: Secondary | ICD-10-CM | POA: Diagnosis not present

## 2015-08-25 DIAGNOSIS — Z1211 Encounter for screening for malignant neoplasm of colon: Secondary | ICD-10-CM | POA: Diagnosis not present

## 2015-09-06 ENCOUNTER — Other Ambulatory Visit (HOSPITAL_COMMUNITY): Payer: Self-pay | Admitting: Neurological Surgery

## 2015-09-06 DIAGNOSIS — M5416 Radiculopathy, lumbar region: Secondary | ICD-10-CM | POA: Diagnosis not present

## 2015-09-18 HISTORY — PX: COLONOSCOPY: SHX174

## 2015-09-20 DIAGNOSIS — Z0000 Encounter for general adult medical examination without abnormal findings: Secondary | ICD-10-CM | POA: Diagnosis not present

## 2015-09-20 DIAGNOSIS — M545 Low back pain: Secondary | ICD-10-CM | POA: Diagnosis not present

## 2015-09-20 DIAGNOSIS — Z79899 Other long term (current) drug therapy: Secondary | ICD-10-CM | POA: Diagnosis not present

## 2015-09-20 DIAGNOSIS — I2510 Atherosclerotic heart disease of native coronary artery without angina pectoris: Secondary | ICD-10-CM | POA: Diagnosis not present

## 2015-09-20 DIAGNOSIS — Z125 Encounter for screening for malignant neoplasm of prostate: Secondary | ICD-10-CM | POA: Diagnosis not present

## 2015-09-20 DIAGNOSIS — E7800 Pure hypercholesterolemia, unspecified: Secondary | ICD-10-CM | POA: Diagnosis not present

## 2015-09-20 DIAGNOSIS — Z1389 Encounter for screening for other disorder: Secondary | ICD-10-CM | POA: Diagnosis not present

## 2015-09-26 ENCOUNTER — Other Ambulatory Visit: Payer: Self-pay | Admitting: Gastroenterology

## 2015-09-26 DIAGNOSIS — D120 Benign neoplasm of cecum: Secondary | ICD-10-CM | POA: Diagnosis not present

## 2015-09-26 DIAGNOSIS — Z1211 Encounter for screening for malignant neoplasm of colon: Secondary | ICD-10-CM | POA: Diagnosis not present

## 2015-09-26 DIAGNOSIS — D126 Benign neoplasm of colon, unspecified: Secondary | ICD-10-CM | POA: Diagnosis not present

## 2015-09-26 DIAGNOSIS — K5730 Diverticulosis of large intestine without perforation or abscess without bleeding: Secondary | ICD-10-CM | POA: Diagnosis not present

## 2015-09-27 ENCOUNTER — Ambulatory Visit (HOSPITAL_COMMUNITY)
Admission: RE | Admit: 2015-09-27 | Discharge: 2015-09-27 | Disposition: A | Discharge status: Home / Self Care | Payer: Medicare Other | Source: Ambulatory Visit | Attending: Neurological Surgery | Admitting: Neurological Surgery

## 2015-09-27 ENCOUNTER — Encounter (HOSPITAL_COMMUNITY): Payer: Self-pay

## 2015-09-27 ENCOUNTER — Encounter (HOSPITAL_COMMUNITY)
Admission: RE | Admit: 2015-09-27 | Discharge: 2015-09-27 | Disposition: A | Discharge status: Home / Self Care | Payer: Medicare Other | Source: Ambulatory Visit | Attending: Neurological Surgery | Admitting: Neurological Surgery

## 2015-09-27 DIAGNOSIS — M4806 Spinal stenosis, lumbar region: Secondary | ICD-10-CM | POA: Diagnosis not present

## 2015-09-27 DIAGNOSIS — Z01812 Encounter for preprocedural laboratory examination: Secondary | ICD-10-CM | POA: Diagnosis not present

## 2015-09-27 DIAGNOSIS — M1990 Unspecified osteoarthritis, unspecified site: Secondary | ICD-10-CM | POA: Diagnosis not present

## 2015-09-27 DIAGNOSIS — I2510 Atherosclerotic heart disease of native coronary artery without angina pectoris: Secondary | ICD-10-CM | POA: Insufficient documentation

## 2015-09-27 DIAGNOSIS — M5030 Other cervical disc degeneration, unspecified cervical region: Secondary | ICD-10-CM

## 2015-09-27 DIAGNOSIS — Z9889 Other specified postprocedural states: Secondary | ICD-10-CM | POA: Diagnosis not present

## 2015-09-27 DIAGNOSIS — E785 Hyperlipidemia, unspecified: Secondary | ICD-10-CM | POA: Diagnosis not present

## 2015-09-27 DIAGNOSIS — Z01818 Encounter for other preprocedural examination: Secondary | ICD-10-CM | POA: Diagnosis not present

## 2015-09-27 DIAGNOSIS — M48061 Spinal stenosis, lumbar region without neurogenic claudication: Secondary | ICD-10-CM

## 2015-09-27 HISTORY — DX: Gastro-esophageal reflux disease without esophagitis: K21.9

## 2015-09-27 LAB — CBC WITH DIFFERENTIAL/PLATELET
BASOS PCT: 0 %
Basophils Absolute: 0 10*3/uL (ref 0.0–0.1)
EOS ABS: 0.2 10*3/uL (ref 0.0–0.7)
EOS PCT: 3 %
HCT: 43.9 % (ref 39.0–52.0)
HEMOGLOBIN: 14.6 g/dL (ref 13.0–17.0)
Lymphocytes Relative: 19 %
Lymphs Abs: 1.5 10*3/uL (ref 0.7–4.0)
MCH: 30.5 pg (ref 26.0–34.0)
MCHC: 33.3 g/dL (ref 30.0–36.0)
MCV: 91.6 fL (ref 78.0–100.0)
MONOS PCT: 8 %
Monocytes Absolute: 0.6 10*3/uL (ref 0.1–1.0)
NEUTROS PCT: 70 %
Neutro Abs: 5.4 10*3/uL (ref 1.7–7.7)
PLATELETS: 167 10*3/uL (ref 150–400)
RBC: 4.79 MIL/uL (ref 4.22–5.81)
RDW: 13.5 % (ref 11.5–15.5)
WBC: 7.7 10*3/uL (ref 4.0–10.5)

## 2015-09-27 LAB — BASIC METABOLIC PANEL
Anion gap: 8 (ref 5–15)
BUN: 8 mg/dL (ref 6–20)
CALCIUM: 9.6 mg/dL (ref 8.9–10.3)
CO2: 25 mmol/L (ref 22–32)
CREATININE: 0.88 mg/dL (ref 0.61–1.24)
Chloride: 107 mmol/L (ref 101–111)
GFR calc non Af Amer: >60 mL/min (ref >60)
Glucose, Bld: 103 mg/dL — ABNORMAL HIGH (ref 65–99)
Potassium: 4.2 mmol/L (ref 3.5–5.1)
SODIUM: 140 mmol/L (ref 135–145)

## 2015-09-27 LAB — SURGICAL PCR SCREEN
MRSA, PCR: NEGATIVE
STAPHYLOCOCCUS AUREUS: NEGATIVE

## 2015-09-27 LAB — PROTIME-INR
INR: 1.18 (ref 0.00–1.49)
PROTHROMBIN TIME: 15.2 s (ref 11.6–15.2)

## 2015-09-27 NOTE — Pre-Procedure Instructions (Signed)
    Danny [name]  09/27/2015      [facility] - [place], [place] - [address] [place] [address] Phone: [phone] Fax: [phone]    Your procedure is scheduled on 10-05-2015   Wednesday   Report to [facility] Admitting at 9:26 A.M.   Call this number if you have problems the morning of surgery:  [phone]   Remember:  Do not eat food or drink liquids after midnight.   Take these medicines the morning of surgery with A SIP OF WATER finasteride(Proscar),Nitroglycerin if needed,omeprazole(Prilosec),Restasis eye drops                Stop aspirin,ibuprofen,advil,aleve,naproxen Goody's powders and all herbal supplements 5 days prior to surgery   Do not wear jewelry  Do not wear lotions, powders, or perfumes.  You may not wear deodorant.  Do not shave 48 hours prior to surgery.  Men may shave face and neck.   Do not bring valuables to the hospital.  [facility] is not responsible for any belongings or valuables.  Contacts, dentures or bridgework may not be worn into surgery.  Leave your suitcase in the car.  After surgery it may be brought to your room.  For patients admitted to the hospital, discharge time will be determined by your treatment team.  Patients discharged the day of surgery will not be allowed to drive home.    Special instructions:  See attached Sheet for instructions on CHG showers  Please read over the following fact sheets that you were given. Pain Booklet, Coughing and Deep Breathing and Surgical Site Infection Prevention

## 2015-10-05 ENCOUNTER — Encounter (HOSPITAL_COMMUNITY)
Admission: RE | Disposition: A | Discharge status: Home / Self Care | Payer: Self-pay | Source: Ambulatory Visit | Attending: Neurological Surgery

## 2015-10-05 ENCOUNTER — Encounter (HOSPITAL_COMMUNITY): Payer: Self-pay | Admitting: Neurological Surgery

## 2015-10-05 ENCOUNTER — Inpatient Hospital Stay (HOSPITAL_COMMUNITY): Payer: Medicare Other

## 2015-10-05 ENCOUNTER — Inpatient Hospital Stay (HOSPITAL_COMMUNITY): Payer: Medicare Other | Admitting: Anesthesiology

## 2015-10-05 ENCOUNTER — Ambulatory Visit (HOSPITAL_COMMUNITY)
Admission: RE | Admit: 2015-10-05 | Discharge: 2015-10-06 | Disposition: A | Discharge status: Home / Self Care | Payer: Medicare Other | Source: Ambulatory Visit | Attending: Neurological Surgery | Admitting: Neurological Surgery

## 2015-10-05 DIAGNOSIS — M5136 Other intervertebral disc degeneration, lumbar region: Secondary | ICD-10-CM | POA: Diagnosis not present

## 2015-10-05 DIAGNOSIS — K219 Gastro-esophageal reflux disease without esophagitis: Secondary | ICD-10-CM | POA: Diagnosis not present

## 2015-10-05 DIAGNOSIS — Z7982 Long term (current) use of aspirin: Secondary | ICD-10-CM | POA: Insufficient documentation

## 2015-10-05 DIAGNOSIS — I2510 Atherosclerotic heart disease of native coronary artery without angina pectoris: Secondary | ICD-10-CM | POA: Insufficient documentation

## 2015-10-05 DIAGNOSIS — M4806 Spinal stenosis, lumbar region: Secondary | ICD-10-CM | POA: Diagnosis not present

## 2015-10-05 DIAGNOSIS — Z79899 Other long term (current) drug therapy: Secondary | ICD-10-CM | POA: Diagnosis not present

## 2015-10-05 DIAGNOSIS — M4807 Spinal stenosis, lumbosacral region: Secondary | ICD-10-CM | POA: Diagnosis not present

## 2015-10-05 DIAGNOSIS — M549 Dorsalgia, unspecified: Secondary | ICD-10-CM

## 2015-10-05 DIAGNOSIS — Z01818 Encounter for other preprocedural examination: Secondary | ICD-10-CM | POA: Diagnosis not present

## 2015-10-05 DIAGNOSIS — Z9889 Other specified postprocedural states: Secondary | ICD-10-CM

## 2015-10-05 HISTORY — PX: LUMBAR LAMINECTOMY/DECOMPRESSION MICRODISCECTOMY: SHX5026

## 2015-10-05 SURGERY — LUMBAR LAMINECTOMY/DECOMPRESSION MICRODISCECTOMY 2 LEVELS
Anesthesia: General | Site: Back | Laterality: Right

## 2015-10-05 MED ORDER — DEXAMETHASONE SODIUM PHOSPHATE 10 MG/ML IJ SOLN
INTRAMUSCULAR | Status: AC
  Filled 2015-10-05: qty 1

## 2015-10-05 MED ORDER — HEMOSTATIC AGENTS (NO CHARGE) OPTIME
TOPICAL | Status: DC | PRN
Start: 2015-10-05 — End: 2015-10-05
  Administered 2015-10-05: 1 via TOPICAL

## 2015-10-05 MED ORDER — LIDOCAINE HCL (CARDIAC) 20 MG/ML IV SOLN
INTRAVENOUS | Status: AC
  Filled 2015-10-05: qty 5

## 2015-10-05 MED ORDER — MEPERIDINE HCL 25 MG/ML IJ SOLN: 6.2500 mg | INTRAMUSCULAR | Status: DC | PRN

## 2015-10-05 MED ORDER — ONDANSETRON HCL 4 MG/2ML IJ SOLN
INTRAMUSCULAR | Status: DC | PRN
  Administered 2015-10-05: 4 mg via INTRAVENOUS

## 2015-10-05 MED ORDER — CYCLOSPORINE 0.05 % OP EMUL
1.0000 [drp] | Freq: Two times a day (BID) | OPHTHALMIC | Status: DC
  Administered 2015-10-05: 1 [drp] via OPHTHALMIC
  Filled 2015-10-05 (×3): qty 1

## 2015-10-05 MED ORDER — MENTHOL 3 MG MT LOZG: 1.0000 | LOZENGE | OROMUCOSAL | Status: DC | PRN

## 2015-10-05 MED ORDER — SODIUM CHLORIDE 0.9 % IJ SOLN: 3.0000 mL | INTRAMUSCULAR | Status: DC | PRN

## 2015-10-05 MED ORDER — MIDAZOLAM HCL 2 MG/2ML IJ SOLN
INTRAMUSCULAR | Status: AC
  Filled 2015-10-05: qty 2

## 2015-10-05 MED ORDER — MORPHINE SULFATE (PF) 2 MG/ML IV SOLN
1.0000 mg | INTRAVENOUS | Status: DC | PRN
  Administered 2015-10-05: 2 mg via INTRAVENOUS
  Filled 2015-10-05: qty 1

## 2015-10-05 MED ORDER — ASPIRIN EC 81 MG PO TBEC: 81.0000 mg | DELAYED_RELEASE_TABLET | Freq: Every day | ORAL | Status: DC

## 2015-10-05 MED ORDER — CEFAZOLIN SODIUM 1-5 GM-% IV SOLN
1.0000 g | Freq: Three times a day (TID) | INTRAVENOUS | Status: AC
  Administered 2015-10-05 – 2015-10-06 (×2): 1 g via INTRAVENOUS
  Filled 2015-10-05 (×2): qty 50

## 2015-10-05 MED ORDER — FENTANYL CITRATE (PF) 250 MCG/5ML IJ SOLN
INTRAMUSCULAR | Status: AC
  Filled 2015-10-05: qty 5

## 2015-10-05 MED ORDER — DEXAMETHASONE 4 MG PO TABS
4.0000 mg | ORAL_TABLET | Freq: Four times a day (QID) | ORAL | Status: DC
  Administered 2015-10-05 – 2015-10-06 (×3): 4 mg via ORAL
  Filled 2015-10-05 (×3): qty 1

## 2015-10-05 MED ORDER — ACETAMINOPHEN 325 MG PO TABS: 650.0000 mg | ORAL_TABLET | ORAL | Status: DC | PRN

## 2015-10-05 MED ORDER — FENTANYL CITRATE (PF) 100 MCG/2ML IJ SOLN
INTRAMUSCULAR | Status: DC | PRN
  Administered 2015-10-05: 50 ug via INTRAVENOUS

## 2015-10-05 MED ORDER — SODIUM CHLORIDE 0.9 % IJ SOLN
3.0000 mL | Freq: Two times a day (BID) | INTRAMUSCULAR | Status: DC
  Administered 2015-10-05 (×2): 3 mL via INTRAVENOUS

## 2015-10-05 MED ORDER — PROMETHAZINE HCL 25 MG/ML IJ SOLN: 6.2500 mg | INTRAMUSCULAR | Status: DC | PRN

## 2015-10-05 MED ORDER — FINASTERIDE 5 MG PO TABS
5.0000 mg | ORAL_TABLET | Freq: Every day | ORAL | Status: DC
  Filled 2015-10-05: qty 1

## 2015-10-05 MED ORDER — SUGAMMADEX SODIUM 200 MG/2ML IV SOLN
INTRAVENOUS | Status: DC | PRN
  Administered 2015-10-05: 149.6 mg via INTRAVENOUS

## 2015-10-05 MED ORDER — ROCURONIUM BROMIDE 100 MG/10ML IV SOLN
INTRAVENOUS | Status: DC | PRN
  Administered 2015-10-05: 50 mg via INTRAVENOUS

## 2015-10-05 MED ORDER — LACTATED RINGERS IV SOLN
INTRAVENOUS | Status: DC
  Administered 2015-10-05: 10:00:00 via INTRAVENOUS

## 2015-10-05 MED ORDER — THROMBIN 5000 UNITS EX SOLR
CUTANEOUS | Status: DC | PRN
  Administered 2015-10-05 (×2): 5000 [IU] via TOPICAL

## 2015-10-05 MED ORDER — ROCURONIUM BROMIDE 50 MG/5ML IV SOLN
INTRAVENOUS | Status: AC
  Filled 2015-10-05: qty 1

## 2015-10-05 MED ORDER — VANCOMYCIN HCL 1000 MG IV SOLR
INTRAVENOUS | Status: DC | PRN
  Administered 2015-10-05: 1000 mg

## 2015-10-05 MED ORDER — EPHEDRINE SULFATE 50 MG/ML IJ SOLN
INTRAMUSCULAR | Status: AC
  Filled 2015-10-05: qty 1

## 2015-10-05 MED ORDER — DEXAMETHASONE SODIUM PHOSPHATE 4 MG/ML IJ SOLN: 4.0000 mg | Freq: Four times a day (QID) | INTRAMUSCULAR | Status: DC

## 2015-10-05 MED ORDER — DEXAMETHASONE SODIUM PHOSPHATE 10 MG/ML IJ SOLN
10.0000 mg | INTRAMUSCULAR | Status: AC
  Administered 2015-10-05: 10 mg via INTRAVENOUS

## 2015-10-05 MED ORDER — OXYCODONE-ACETAMINOPHEN 5-325 MG PO TABS
1.0000 | ORAL_TABLET | ORAL | Status: DC | PRN
  Administered 2015-10-05 – 2015-10-06 (×2): 1 via ORAL
  Filled 2015-10-05 (×2): qty 1

## 2015-10-05 MED ORDER — MIDAZOLAM HCL 5 MG/5ML IJ SOLN
INTRAMUSCULAR | Status: DC | PRN
  Administered 2015-10-05: 1 mg via INTRAVENOUS

## 2015-10-05 MED ORDER — GLYCOPYRROLATE 0.2 MG/ML IJ SOLN
INTRAMUSCULAR | Status: AC
  Filled 2015-10-05: qty 1

## 2015-10-05 MED ORDER — FENTANYL CITRATE (PF) 100 MCG/2ML IJ SOLN: 25.0000 ug | INTRAMUSCULAR | Status: DC | PRN

## 2015-10-05 MED ORDER — PHENYLEPHRINE 40 MCG/ML (10ML) SYRINGE FOR IV PUSH (FOR BLOOD PRESSURE SUPPORT)
PREFILLED_SYRINGE | INTRAVENOUS | Status: AC
  Filled 2015-10-05: qty 10

## 2015-10-05 MED ORDER — 0.9 % SODIUM CHLORIDE (POUR BTL) OPTIME
TOPICAL | Status: DC | PRN
  Administered 2015-10-05: 1000 mL

## 2015-10-05 MED ORDER — ACETAMINOPHEN 650 MG RE SUPP: 650.0000 mg | RECTAL | Status: DC | PRN

## 2015-10-05 MED ORDER — VANCOMYCIN HCL 1000 MG IV SOLR
INTRAVENOUS | Status: AC
  Filled 2015-10-05: qty 1000

## 2015-10-05 MED ORDER — SODIUM CHLORIDE 0.9 % IV SOLN: 250.0000 mL | INTRAVENOUS | Status: DC

## 2015-10-05 MED ORDER — POTASSIUM CHLORIDE IN NACL 20-0.9 MEQ/L-% IV SOLN
INTRAVENOUS | Status: DC
  Filled 2015-10-05 (×4): qty 1000

## 2015-10-05 MED ORDER — BUPIVACAINE HCL (PF) 0.25 % IJ SOLN
INTRAMUSCULAR | Status: DC | PRN
  Administered 2015-10-05: 10 mL

## 2015-10-05 MED ORDER — CEFAZOLIN SODIUM-DEXTROSE 2-3 GM-% IV SOLR
INTRAVENOUS | Status: AC
  Filled 2015-10-05: qty 50

## 2015-10-05 MED ORDER — PREGABALIN 50 MG PO CAPS
100.0000 mg | ORAL_CAPSULE | Freq: Every day | ORAL | Status: DC
  Administered 2015-10-05: 100 mg via ORAL
  Filled 2015-10-05: qty 2

## 2015-10-05 MED ORDER — LIDOCAINE HCL (CARDIAC) 20 MG/ML IV SOLN
INTRAVENOUS | Status: DC | PRN
  Administered 2015-10-05: 80 mg via INTRAVENOUS

## 2015-10-05 MED ORDER — ONDANSETRON HCL 4 MG/2ML IJ SOLN
INTRAMUSCULAR | Status: AC
  Filled 2015-10-05: qty 2

## 2015-10-05 MED ORDER — ONDANSETRON HCL 4 MG/2ML IJ SOLN: 4.0000 mg | INTRAMUSCULAR | Status: DC | PRN

## 2015-10-05 MED ORDER — PROPOFOL 10 MG/ML IV BOLUS
INTRAVENOUS | Status: DC | PRN
  Administered 2015-10-05: 130 mg via INTRAVENOUS

## 2015-10-05 MED ORDER — GELATIN ABSORBABLE MT POWD
OROMUCOSAL | Status: DC | PRN
  Administered 2015-10-05: 10 mL via TOPICAL

## 2015-10-05 MED ORDER — SODIUM CHLORIDE 0.9 % IR SOLN
Status: DC | PRN
  Administered 2015-10-05: 500 mL

## 2015-10-05 MED ORDER — PHENOL 1.4 % MT LIQD: 1.0000 | OROMUCOSAL | Status: DC | PRN

## 2015-10-05 MED ORDER — CEFAZOLIN SODIUM-DEXTROSE 2-3 GM-% IV SOLR: 2.0000 g | INTRAVENOUS | Status: DC

## 2015-10-05 SURGICAL SUPPLY — 43 items
ADH SKN CLS LQ APL DERMABOND (GAUZE/BANDAGES/DRESSINGS) ×1
APL SKNCLS STERI-STRIP NONHPOA (GAUZE/BANDAGES/DRESSINGS) ×1
BAG DECANTER FOR FLEXI CONT (MISCELLANEOUS) ×3 IMPLANT
BENZOIN TINCTURE PRP APPL 2/3 (GAUZE/BANDAGES/DRESSINGS) ×3 IMPLANT
BUR MATCHSTICK NEURO 3.0 LAGG (BURR) ×3 IMPLANT
CANISTER SUCT 3000ML PPV (MISCELLANEOUS) ×3 IMPLANT
CLOSURE WOUND 1/2 X4 (GAUZE/BANDAGES/DRESSINGS) ×1
DERMABOND ADHESIVE PROPEN (GAUZE/BANDAGES/DRESSINGS) ×2
DERMABOND ADVANCED .7 DNX6 (GAUZE/BANDAGES/DRESSINGS) ×1 IMPLANT
DRAPE LAPAROTOMY 100X72X124 (DRAPES) ×3 IMPLANT
DRAPE MICROSCOPE LEICA (MISCELLANEOUS) ×3 IMPLANT
DRAPE POUCH INSTRU U-SHP 10X18 (DRAPES) ×3 IMPLANT
DRAPE SURG 17X23 STRL (DRAPES) ×3 IMPLANT
DRSG OPSITE POSTOP 4X6 (GAUZE/BANDAGES/DRESSINGS) ×3 IMPLANT
DURAPREP 26ML APPLICATOR (WOUND CARE) ×3 IMPLANT
ELECT REM PT RETURN 9FT ADLT (ELECTROSURGICAL) ×3
ELECTRODE REM PT RTRN 9FT ADLT (ELECTROSURGICAL) ×1 IMPLANT
GAUZE SPONGE 4X4 16PLY XRAY LF (GAUZE/BANDAGES/DRESSINGS) IMPLANT
GLOVE BIO SURGEON STRL SZ8 (GLOVE) ×3 IMPLANT
GOWN STRL REUS W/ TWL LRG LVL3 (GOWN DISPOSABLE) IMPLANT
GOWN STRL REUS W/ TWL XL LVL3 (GOWN DISPOSABLE) ×1 IMPLANT
GOWN STRL REUS W/TWL 2XL LVL3 (GOWN DISPOSABLE) IMPLANT
GOWN STRL REUS W/TWL LRG LVL3 (GOWN DISPOSABLE)
GOWN STRL REUS W/TWL XL LVL3 (GOWN DISPOSABLE) ×3
HEMOSTAT POWDER KIT SURGIFOAM (HEMOSTASIS) IMPLANT
KIT BASIN OR (CUSTOM PROCEDURE TRAY) ×3 IMPLANT
KIT ROOM TURNOVER OR (KITS) ×3 IMPLANT
NEEDLE HYPO 25X1 1.5 SAFETY (NEEDLE) ×3 IMPLANT
NEEDLE SPNL 20GX3.5 QUINCKE YW (NEEDLE) IMPLANT
NS IRRIG 1000ML POUR BTL (IV SOLUTION) ×3 IMPLANT
PACK LAMINECTOMY NEURO (CUSTOM PROCEDURE TRAY) ×3 IMPLANT
PAD ARMBOARD 7.5X6 YLW CONV (MISCELLANEOUS) ×9 IMPLANT
RUBBERBAND STERILE (MISCELLANEOUS) ×6 IMPLANT
SPONGE SURGIFOAM ABS GEL SZ50 (HEMOSTASIS) ×3 IMPLANT
STRIP CLOSURE SKIN 1/2X4 (GAUZE/BANDAGES/DRESSINGS) ×2 IMPLANT
SUT VIC AB 0 CT1 18XCR BRD8 (SUTURE) ×1 IMPLANT
SUT VIC AB 0 CT1 8-18 (SUTURE) ×2
SUT VIC AB 2-0 CP2 18 (SUTURE) ×3 IMPLANT
SUT VIC AB 3-0 SH 8-18 (SUTURE) ×3 IMPLANT
TAPE STRIPS DRAPE STRL (GAUZE/BANDAGES/DRESSINGS) ×3 IMPLANT
TOWEL OR 17X24 6PK STRL BLUE (TOWEL DISPOSABLE) ×3 IMPLANT
TOWEL OR 17X26 10 PK STRL BLUE (TOWEL DISPOSABLE) ×3 IMPLANT
WATER STERILE IRR 1000ML POUR (IV SOLUTION) ×3 IMPLANT

## 2015-10-05 NOTE — Anesthesia Preprocedure Evaluation (Signed)
Anesthesia Evaluation  Patient identified by MRN, date of birth, ID band Patient awake    Reviewed: Allergy & Precautions, NPO status , Patient's Chart, lab work & pertinent test results  Airway Mallampati: II  TM Distance: >3 FB Neck ROM: Full    Dental no notable dental hx.    Pulmonary    Pulmonary exam normal breath sounds clear to auscultation       Cardiovascular + CAD  Normal cardiovascular exam Rhythm:Regular Rate:Normal     Neuro/Psych negative neurological ROS  negative psych ROS   GI/Hepatic Neg liver ROS, GERD  Medicated and Controlled,  Endo/Other  negative endocrine ROS  Renal/GU negative Renal ROS  negative genitourinary   Musculoskeletal negative musculoskeletal ROS (+)   Abdominal   Peds negative pediatric ROS (+)  Hematology negative hematology ROS (+)   Anesthesia Other Findings   Reproductive/Obstetrics negative OB ROS                             Anesthesia Physical Anesthesia Plan  ASA: III  Anesthesia Plan: General   Post-op Pain Management:    Induction: Intravenous  Airway Management Planned: Oral ETT  Additional Equipment:   Intra-op Plan:   Post-operative Plan: Extubation in OR  Informed Consent: I have reviewed the patients History and Physical, chart, labs and discussed the procedure including the risks, benefits and alternatives for the proposed anesthesia with the patient or authorized representative who has indicated his/her understanding and acceptance.   Dental advisory given  Plan Discussed with: CRNA  Anesthesia Plan Comments:         Anesthesia Quick Evaluation

## 2015-10-05 NOTE — Op Note (Signed)
10/05/2015  1:47 PM  PATIENT:  Danny [name]  80 y.o. male  PRE-OPERATIVE DIAGNOSIS:  Foraminal stenosis L4-5 L5-S1 right with right leg pain  POST-OPERATIVE DIAGNOSIS:  Same  PROCEDURE:  Right L4-5 and L5-S1 extra foraminal decompression  SURGEON:  Sherley Bounds, MD  ASSISTANTS: Dr. Kathyrn Sheriff  ANESTHESIA:   General  EBL: Less than 25 ml     BLOOD ADMINISTERED:none  DRAINS: None   SPECIMEN:  No Specimen  INDICATION FOR PROCEDURE: This patient presented with right leg pain. MRI showed severe ramal stenosis at L5-S1 on the right and moderate stenosis at L4-5 on the right. He tried medical management without relief. Recommended extra foraminal decompression of L4 and L5 nerve roots. Patient understood the risks, benefits, and alternatives and potential outcomes and wished to proceed.  PROCEDURE DETAILS: The patient was taken to the operating room and after induction of adequate generalized endotracheal anesthesia, the patient was rolled into the prone position on the Wilson frame and all pressure points were padded. The lumbar region was cleaned and then prepped with DuraPrep and draped in the usual sterile fashion. 5 cc of local anesthesia was injected and then a dorsal midline incision was made and carried down to the lumbo sacral fascia. The fascia was opened and the paraspinous musculature was taken down in a subperiosteal fashion to expose L4-5 and L5-S1 on the right. Intraoperative x-ray confirmed my level, and then I used a combination of the high-speed drill to drill away the lateral part of the pars and the superior part of the facet on the right at L4-5 and L5-S1. The underlying ligament was opened and removed in a piecemeal fashion to expose the underlying dura and exiting nerve root. It was significant bony overgrowth from the facet and pars. I undercut the pars and facet complex and dissected down until I was medial to and distal to the pedicle. The nerve root was well  decompressed. I did not find significant disc herniation at either level but I did not expect to. I felt that it was all spondylosis and bony compression. I then palpated with a coronary dilator along the nerve root and into the foramen to assure adequate decompression. I felt no more compression of the nerve root. I irrigated with saline solution containing bacitracin. Achieved hemostasis with bipolar cautery, lined the dura with Gelfoam, and then closed the fascia with 0 Vicryl. I closed the subcutaneous tissues with 2-0 Vicryl and the subcuticular tissues with 3-0 Vicryl. The skin was then closed with benzoin and Steri-Strips. The drapes were removed, a sterile dressing was applied. The patient was awakened from general anesthesia and transferred to the recovery room in stable condition. At the end of the procedure all sponge, needle and instrument counts were correct.   PLAN OF CARE: Admit for overnight observation  PATIENT DISPOSITION:  PACU - hemodynamically stable.   Delay start of Pharmacological VTE agent (>24hrs) due to surgical blood loss or risk of bleeding:  yes

## 2015-10-05 NOTE — Anesthesia Postprocedure Evaluation (Signed)
Anesthesia Post Note  Patient: MYRNA MAJID  Procedure(s) Performed: Procedure(s) (LRB): Extraforaminal Microdiscectomy  - L4-L5 - L5-S1 - right (Right)  Patient location during evaluation: PACU Anesthesia Type: General Level of consciousness: awake and alert Pain management: pain level controlled Vital Signs Assessment: post-procedure vital signs reviewed and stable Respiratory status: spontaneous breathing, nonlabored ventilation, respiratory function stable and patient connected to nasal cannula oxygen Cardiovascular status: blood pressure returned to baseline and stable Postop Assessment: no signs of nausea or vomiting Anesthetic complications: no    Last Vitals:  Filed Vitals:   10/05/15 1435 10/05/15 1455  BP:  123/78  Pulse:  76  Temp: 36.4 C 36.4 C  Resp:  20    Last Pain:  Filed Vitals:   10/05/15 1456  PainSc: 0-No pain                 Montez Hageman

## 2015-10-05 NOTE — H&P (Signed)
Subjective: Patient is a 80 y.o. male admitted for R leg pain. Onset of symptoms was several months ago, gradually worsening since that time.  The pain is rated severe, and is located at the across the lower back and radiates to RLE. The pain is described as aching and occurs intermittently. The symptoms have been progressive. Symptoms are exacerbated by exercise. MRI or CT showed foraminal stenosis L4-5 L5-S1   Past Medical History  Diagnosis Date  . Diverticulosis   . Sixth nerve palsy 2001  . Impaired fasting glucose   . Hemorrhoids   . Allergic rhinitis   . Detached vitreous humor   . Lumbar radiculopathy   . BPH (benign prostatic hypertrophy)     Dr. Alinda Money  . Ingrown toenail   . Rotator cuff disorder   . Onychomycosis   . Arthritis   . Heart murmur   . Cervical disc disease   . Numbness and tingling in left arm   . Coronary artery disease     Patient developed exertional dyspnea. ETT-myoview (8/12) with 11 METS, normal LV EF, normal perfusion at rest and stress but elevated TID ratio. Coronary CT angiogram in 9/12 showed equivocal significant disease in the CFX. Cath in 10/12 showed EF 55-60%, 30% mid RCA, 40% distal RCA, 40% proximal LAD. ETT-Myoview (10/14): Diaphragmatic attenuation, no ischemia, EF 62%, normal study  . Diverticulosis   . GERD (gastroesophageal reflux disease)     Past Surgical History  Procedure Laterality Date  . Abnormal stress test    . Spine surgery  07/06/10 and 09/07/09    spinal fusion  . Rotator cuff repair  07/17/2007    left  . Hernia repair  1996    [facility]  . Hemorrhoid surgery  1965  . Appendectomy  1957  . Tonsillectomy and adenoidectomy  1947  . Back surgery  2010,2011    lumbar fusion-  . Cardiac catheterization    . Lipoma excision  09/05/2011    Procedure: EXCISION LIPOMA;  Surgeon: Haywood Lasso, MD;  Location: [facility];  Service: General;  Laterality: Left;  removal of 4 cm lipoma of back  . Melonoma Right      removed from right arm    Prior to Admission medications   Medication Sig Start Date End Date Taking? Authorizing Provider  aspirin 81 MG tablet Take 81 mg by mouth daily.     Yes Historical Provider, MD  atorvastatin (LIPITOR) 20 MG tablet Take 1 tablet (20 mg total) by mouth daily. 02/19/15  Yes Brett Canales, PA-C  Calcium Citrate-Vitamin D (CITRACAL + D PO) Take 1 tablet by mouth daily.   Yes Historical Provider, MD  Coenzyme Q10 (CO Q 10 PO) Take 400 mg by mouth daily.   Yes Historical Provider, MD  finasteride (PROSCAR) 5 MG tablet Take 5 mg by mouth daily.     Yes Historical Provider, MD  Multiple Vitamin (MULTIVITAMIN) tablet Take 1 tablet by mouth daily.     Yes Historical Provider, MD  nitroGLYCERIN (NITROSTAT) 0.4 MG SL tablet Place 1 tablet (0.4 mg total) under the tongue every 5 (five) minutes as needed for chest pain. 02/19/15  Yes Brett Canales, PA-C  omeprazole (PRILOSEC) 20 MG capsule Take 1 tablet by mouth two times a day 01/04/15  Yes Larey Dresser, MD  polyethylene glycol [facility] [place] / GLYCOLAX) packet Take 17 g by mouth daily as needed.   Yes Historical Provider, MD  pregabalin (LYRICA) 100 MG capsule Take  100-200 mg by mouth at bedtime.    Yes Historical Provider, MD  RESTASIS 0.05 % ophthalmic emulsion Place 1 drop into both eyes 2 (two) times daily.  03/11/13  Yes Historical Provider, MD  Saw Palmetto, Serenoa repens, (SAW PALMETTO PO) Take 1 tablet by mouth 2 (two) times daily.   Yes Historical Provider, MD  Wheat Dextrin (BENEFIBER PO) Take 1 Dose by mouth daily as needed.   Yes Historical Provider, MD   Allergies  Allergen Reactions  . Toradol [Ketorolac Tromethamine]     Gastric upset    Social History  Substance Use Topics  . Smoking status: Never Smoker   . Smokeless tobacco: Never Used  . Alcohol Use: Yes     Comment: occasionally    Family History  Problem Relation Age of Onset  . Cancer Mother     multiple myeloma  . Stroke Father   . Heart attack Neg  Hx   . Hypertension Mother      Review of Systems  Positive ROS: neg  All other systems have been reviewed and were otherwise negative with the exception of those mentioned in the HPI and as above.  Objective: Vital signs in last 24 hours:    General Appearance: Alert, cooperative, no distress, appears stated age Head: Normocephalic, without obvious abnormality, atraumatic Eyes: PERRL, conjunctiva/corneas clear, EOM'Danny [name] intact    Neck: Supple, symmetrical, trachea midline Back: Symmetric, no curvature, ROM normal, no CVA tenderness Lungs:  respirations unlabored Heart: Regular rate and rhythm Abdomen: Soft, non-tender Extremities: Extremities normal, atraumatic, no cyanosis or edema Pulses: 2+ and symmetric all extremities Skin: Skin color, texture, turgor normal, no rashes or lesions  NEUROLOGIC:   Mental status: Alert and oriented x4,  no aphasia, good attention span, fund of knowledge, and memory Motor Exam - grossly normal Sensory Exam - grossly normal Reflexes: 1+ Coordination - grossly normal Gait - grossly normal Balance - grossly normal Cranial Nerves: I: smell Not tested  II: visual acuity  OS: nl    OD: nl  II: visual fields Full to confrontation  II: pupils Equal, round, reactive to light  III,VII: ptosis None  III,IV,VI: extraocular muscles  Full ROM  V: mastication Normal  V: facial light touch sensation  Normal  V,VII: corneal reflex  Present  VII: facial muscle function - upper  Normal  VII: facial muscle function - lower Normal  VIII: hearing Not tested  IX: soft palate elevation  Normal  IX,X: gag reflex Present  XI: trapezius strength  5/5  XI: sternocleidomastoid strength 5/5  XI: neck flexion strength  5/5  XII: tongue strength  Normal    Data Review Lab Results  Component Value Date   WBC 7.7 09/27/2015   HGB 14.6 09/27/2015   HCT 43.9 09/27/2015   MCV 91.6 09/27/2015   PLT 167 09/27/2015   Lab Results  Component Value Date   NA  140 09/27/2015   K 4.2 09/27/2015   CL 107 09/27/2015   CO2 25 09/27/2015   BUN 8 09/27/2015   CREATININE 0.88 09/27/2015   GLUCOSE 103* 09/27/2015   Lab Results  Component Value Date   INR 1.18 09/27/2015    Assessment/Plan: Patient admitted for R L4-5 L5-S1 extraforaminal decompression. Patient has failed a reasonable attempt at conservative therapy.  I explained the condition and procedure to the patient and answered any questions.  Patient wishes to proceed with procedure as planned. Understands risks/ benefits and typical outcomes of procedure.   Danny Danny [name]  10/05/2015 9:33 AM

## 2015-10-05 NOTE — Transfer of Care (Signed)
Immediate Anesthesia Transfer of Care Note  Patient: Danny [name]  Procedure(s) Performed: Procedure(s) with comments: Extraforaminal Microdiscectomy  - L4-L5 - L5-S1 - right (Right) - Extraforaminal Microdiscectomy  - L4-L5 - L5-S1 - right  Patient Location: PACU  Anesthesia Type:General  Level of Consciousness: awake, alert  and oriented  Airway & Oxygen Therapy: Patient Spontanous Breathing and Patient connected to nasal cannula oxygen  Post-op Assessment: Report given to RN and Post -op Vital signs reviewed and stable  Post vital signs: Reviewed and stable  Last Vitals:  Filed Vitals:   10/05/15 1455 10/05/15 1727  BP: 123/78 117/62  Pulse: 76 92  Temp: 36.4 C 36.5 C  Resp: 20 20    Complications: No apparent anesthesia complications

## 2015-10-05 NOTE — Anesthesia Procedure Notes (Signed)
Procedure Name: Intubation Date/Time: 10/05/2015 12:01 PM Performed by: Eligha Bridegroom Pre-anesthesia Checklist: Patient identified, Patient being monitored, Timeout performed, Emergency Drugs available and Suction available Patient Re-evaluated:Patient Re-evaluated prior to inductionOxygen Delivery Method: Circle system utilized Preoxygenation: Pre-oxygenation with 100% oxygen Intubation Type: IV induction Ventilation: Mask ventilation without difficulty Laryngoscope Size: Mac and 3 Grade View: Grade I Tube type: Oral Tube size: 7.5 mm Number of attempts: 1 Airway Equipment and Method: Stylet Placement Confirmation: ETT inserted through vocal cords under direct vision,  breath sounds checked- equal and bilateral and positive ETCO2 Secured at: 22 cm Tube secured with: Tape Dental Injury: Teeth and Oropharynx as per pre-operative assessment  Comments: IV induction MDA--- intubation CRNA atraumatic teeth and mouth as preop

## 2015-10-06 ENCOUNTER — Encounter (HOSPITAL_COMMUNITY): Payer: Self-pay | Admitting: Neurological Surgery

## 2015-10-06 DIAGNOSIS — I2510 Atherosclerotic heart disease of native coronary artery without angina pectoris: Secondary | ICD-10-CM | POA: Diagnosis not present

## 2015-10-06 DIAGNOSIS — M4806 Spinal stenosis, lumbar region: Secondary | ICD-10-CM | POA: Diagnosis not present

## 2015-10-06 DIAGNOSIS — M4807 Spinal stenosis, lumbosacral region: Secondary | ICD-10-CM | POA: Diagnosis not present

## 2015-10-06 DIAGNOSIS — Z79899 Other long term (current) drug therapy: Secondary | ICD-10-CM | POA: Diagnosis not present

## 2015-10-06 DIAGNOSIS — Z7982 Long term (current) use of aspirin: Secondary | ICD-10-CM | POA: Diagnosis not present

## 2015-10-06 MED ORDER — OXYCODONE-ACETAMINOPHEN 5-325 MG PO TABS: 1.0000 | ORAL_TABLET | ORAL | Status: DC | PRN

## 2015-10-06 NOTE — Discharge Summary (Signed)
Physician Discharge Summary  Patient ID: Danny [name] MRN: [id number] DOB/AGE: 21-Jun-1936 80 y.o.  Admit date: 10/05/2015 Discharge date: 10/06/2015  Admission Diagnoses: foraminal stenosis   Discharge Diagnoses: same   Discharged Condition: good  Hospital Course: The patient was admitted on 10/05/2015 and taken to the operating room where the patient underwent extraforaminal decompression. The patient tolerated the procedure well and was taken to the recovery room and then to the floor in stable condition. The hospital course was routine. There were no complications. The wound remained clean dry and intact. Pt had appropriate back soreness. No complaints of leg pain or new N/T/W. The patient remained afebrile with stable vital signs, and tolerated a regular diet. The patient continued to increase activities, and pain was well controlled with oral pain medications.   Consults: None  Significant Diagnostic Studies:  Results for orders placed or performed during the hospital encounter of 09/27/15  Surgical pcr screen  Result Value Ref Range   MRSA, PCR NEGATIVE NEGATIVE   Staphylococcus aureus NEGATIVE NEGATIVE  Basic metabolic panel  Result Value Ref Range   Sodium 140 135 - 145 mmol/L   Potassium 4.2 3.5 - 5.1 mmol/L   Chloride 107 101 - 111 mmol/L   CO2 25 22 - 32 mmol/L   Glucose, Bld 103 (H) 65 - 99 mg/dL   BUN 8 6 - 20 mg/dL   Creatinine, Ser 0.88 0.61 - 1.24 mg/dL   Calcium 9.6 8.9 - 10.3 mg/dL   GFR calc non Af Amer >60 >60 mL/min   GFR calc Af Amer >60 >60 mL/min   Anion gap 8 5 - 15  CBC WITH DIFFERENTIAL  Result Value Ref Range   WBC 7.7 4.0 - 10.5 K/uL   RBC 4.79 4.22 - 5.81 MIL/uL   Hemoglobin 14.6 13.0 - 17.0 g/dL   HCT 43.9 39.0 - 52.0 %   MCV 91.6 78.0 - 100.0 fL   MCH 30.5 26.0 - 34.0 pg   MCHC 33.3 30.0 - 36.0 g/dL   RDW 13.5 11.5 - 15.5 %   Platelets 167 150 - 400 K/uL   Neutrophils Relative % 70 %   Neutro Abs 5.4 1.7 - 7.7 K/uL   Lymphocytes  Relative 19 %   Lymphs Abs 1.5 0.7 - 4.0 K/uL   Monocytes Relative 8 %   Monocytes Absolute 0.6 0.1 - 1.0 K/uL   Eosinophils Relative 3 %   Eosinophils Absolute 0.2 0.0 - 0.7 K/uL   Basophils Relative 0 %   Basophils Absolute 0.0 0.0 - 0.1 K/uL  Protime-INR  Result Value Ref Range   Prothrombin Time 15.2 11.6 - 15.2 seconds   INR 1.18 0.00 - 1.49    Chest 2 View  09/27/2015  CLINICAL DATA:  Back surgery. EXAM: CHEST  2 VIEW COMPARISON:  02/18/2015. FINDINGS: Mediastinum and hilar structures normal. Lungs are clear. No pleural effusion or pneumothorax. Heart size normal. Degenerative changes thoracic spine. IMPRESSION: No active cardiopulmonary disease. Electronically Signed   By: Marcello Moores  Register   On: 09/27/2015 14:30   Dg Lumbar Spine 2-3 Views  10/05/2015  CLINICAL DATA:  Preop instrument placement for L4-5 discectomy. EXAM: LUMBAR SPINE - 2-3 VIEW COMPARISON:  03/26/2015 MRI FINDINGS: Spinal numbering as on preoperative MRI. Retractor present at the L5 level with superimposed probe. Previous L2-3 and L3-4 discectomy with intervertebral cages. IMPRESSION: Posterior retractor at the L5 level. Electronically Signed   By: Monte Fantasia M.D.   On: 10/05/2015 13:38  Antibiotics:  Anti-infectives    Start     Dose/Rate Route Frequency Ordered Stop   10/05/15 2000  ceFAZolin (ANCEF) IVPB 1 g/50 mL premix     1 g 100 mL/hr over 30 Minutes Intravenous Every 8 hours 10/05/15 1455 10/06/15 0404   10/05/15 1328  vancomycin (VANCOCIN) powder  Status:  Discontinued       As needed 10/05/15 1329 10/05/15 1352   10/05/15 1259  bacitracin 50,000 Units in sodium chloride irrigation 0.9 % 500 mL irrigation  Status:  Discontinued       As needed 10/05/15 1259 10/05/15 1352   10/05/15 1142  vancomycin (VANCOCIN) 1000 MG powder    Comments:  Latricia Heft   : cabinet override      10/05/15 1142 10/05/15 2344   10/05/15 0926  ceFAZolin (ANCEF) IVPB 2 g/50 mL premix  Status:  Discontinued     2  g 100 mL/hr over 30 Minutes Intravenous On call to O.R. 10/05/15 0927 10/05/15 1453   10/05/15 0630  ceFAZolin (ANCEF) 2-3 GM-% IVPB SOLR    Comments:  Maryjean Ka   : cabinet override      10/05/15 0630 10/05/15 1844      Discharge Exam: Blood pressure 98/62, pulse 77, temperature 98.4 F (36.9 C), temperature source Oral, resp. rate 16, height 5\' 9"  (1.753 m), weight 74.801 kg (164 lb 14.5 oz), SpO2 97 %. Neurologic: Grossly normal Dressing dry  Discharge Medications:     Medication List    TAKE these medications        aspirin 81 MG tablet  Take 81 mg by mouth daily.     atorvastatin 20 MG tablet  Commonly known as:  LIPITOR  Take 1 tablet (20 mg total) by mouth daily.     BENEFIBER PO  Take 1 Dose by mouth daily as needed.     CITRACAL + D PO  Take 1 tablet by mouth daily.     CO Q 10 PO  Take 400 mg by mouth daily.     finasteride 5 MG tablet  Commonly known as:  PROSCAR  Take 5 mg by mouth daily.     multivitamin tablet  Take 1 tablet by mouth daily.     nitroGLYCERIN 0.4 MG SL tablet  Commonly known as:  NITROSTAT  Place 1 tablet (0.4 mg total) under the tongue every 5 (five) minutes as needed for chest pain.     omeprazole 20 MG capsule  Commonly known as:  PRILOSEC  Take 1 tablet by mouth two times a day     oxyCODONE-acetaminophen 5-325 MG tablet  Commonly known as:  PERCOCET/ROXICET  Take 1 tablet by mouth every 4 (four) hours as needed for moderate pain.     polyethylene glycol packet  Commonly known as:  MIRALAX / GLYCOLAX  Take 17 g by mouth daily as needed.     pregabalin 100 MG capsule  Commonly known as:  LYRICA  Take 100-200 mg by mouth at bedtime.     RESTASIS 0.05 % ophthalmic emulsion  Generic drug:  cycloSPORINE  Place 1 drop into both eyes 2 (two) times daily.     SAW PALMETTO PO  Take 1 tablet by mouth 2 (two) times daily.        Disposition: home   Final Dx: extraforaminal decompression L4-5, L5-S1       Discharge Instructions     Remove dressing in 72 hours    Complete by:  As directed  Call MD for:  difficulty breathing, headache or visual disturbances    Complete by:  As directed      Call MD for:  persistant nausea and vomiting    Complete by:  As directed      Call MD for:  redness, tenderness, or signs of infection (pain, swelling, redness, odor or green/yellow discharge around incision site)    Complete by:  As directed      Call MD for:  severe uncontrolled pain    Complete by:  As directed      Call MD for:  temperature >100.4    Complete by:  As directed      Diet - low sodium heart healthy    Complete by:  As directed      Discharge instructions    Complete by:  As directed   May shower, no liftng or strenuous activity     Increase activity slowly    Complete by:  As directed               Signed: Tanyia Grabbe S 10/06/2015, 9:44 AM

## 2015-10-06 NOTE — Progress Notes (Signed)
Patient alert and oriented, mae's well, voiding adequate amount of urine, swallowing without difficulty, no c/o pain. Patient discharged home with family. Script and discharged instructions given to patient. Patient and family stated understanding of d/c instructions given and has an appointment with MD. 

## 2015-11-07 DIAGNOSIS — Z79899 Other long term (current) drug therapy: Secondary | ICD-10-CM | POA: Diagnosis not present

## 2015-11-07 DIAGNOSIS — K219 Gastro-esophageal reflux disease without esophagitis: Secondary | ICD-10-CM | POA: Diagnosis not present

## 2015-11-09 DIAGNOSIS — L820 Inflamed seborrheic keratosis: Secondary | ICD-10-CM | POA: Diagnosis not present

## 2015-11-09 DIAGNOSIS — Z85820 Personal history of malignant melanoma of skin: Secondary | ICD-10-CM | POA: Diagnosis not present

## 2015-11-09 DIAGNOSIS — L821 Other seborrheic keratosis: Secondary | ICD-10-CM | POA: Diagnosis not present

## 2015-12-09 DIAGNOSIS — M25471 Effusion, right ankle: Secondary | ICD-10-CM | POA: Diagnosis not present

## 2015-12-09 DIAGNOSIS — S82831A Other fracture of upper and lower end of right fibula, initial encounter for closed fracture: Secondary | ICD-10-CM | POA: Diagnosis not present

## 2015-12-09 DIAGNOSIS — M25561 Pain in right knee: Secondary | ICD-10-CM | POA: Diagnosis not present

## 2015-12-09 DIAGNOSIS — M25461 Effusion, right knee: Secondary | ICD-10-CM | POA: Diagnosis not present

## 2015-12-09 DIAGNOSIS — M25571 Pain in right ankle and joints of right foot: Secondary | ICD-10-CM | POA: Diagnosis not present

## 2015-12-19 DIAGNOSIS — M25571 Pain in right ankle and joints of right foot: Secondary | ICD-10-CM | POA: Diagnosis not present

## 2015-12-19 DIAGNOSIS — S82831D Other fracture of upper and lower end of right fibula, subsequent encounter for closed fracture with routine healing: Secondary | ICD-10-CM | POA: Diagnosis not present

## 2015-12-29 DIAGNOSIS — M79671 Pain in right foot: Secondary | ICD-10-CM | POA: Diagnosis not present

## 2015-12-29 DIAGNOSIS — M25571 Pain in right ankle and joints of right foot: Secondary | ICD-10-CM | POA: Diagnosis not present

## 2015-12-29 DIAGNOSIS — S82831D Other fracture of upper and lower end of right fibula, subsequent encounter for closed fracture with routine healing: Secondary | ICD-10-CM | POA: Diagnosis not present

## 2016-01-16 DIAGNOSIS — M25871 Other specified joint disorders, right ankle and foot: Secondary | ICD-10-CM | POA: Diagnosis not present

## 2016-01-16 DIAGNOSIS — M25471 Effusion, right ankle: Secondary | ICD-10-CM | POA: Diagnosis not present

## 2016-01-16 DIAGNOSIS — M25571 Pain in right ankle and joints of right foot: Secondary | ICD-10-CM | POA: Diagnosis not present

## 2016-01-16 DIAGNOSIS — M62561 Muscle wasting and atrophy, not elsewhere classified, right lower leg: Secondary | ICD-10-CM | POA: Diagnosis not present

## 2016-01-25 DIAGNOSIS — S82831D Other fracture of upper and lower end of right fibula, subsequent encounter for closed fracture with routine healing: Secondary | ICD-10-CM | POA: Diagnosis not present

## 2016-01-30 DIAGNOSIS — S82831D Other fracture of upper and lower end of right fibula, subsequent encounter for closed fracture with routine healing: Secondary | ICD-10-CM | POA: Diagnosis not present

## 2016-02-02 DIAGNOSIS — S82831D Other fracture of upper and lower end of right fibula, subsequent encounter for closed fracture with routine healing: Secondary | ICD-10-CM | POA: Diagnosis not present

## 2016-02-06 DIAGNOSIS — H903 Sensorineural hearing loss, bilateral: Secondary | ICD-10-CM | POA: Diagnosis not present

## 2016-02-06 DIAGNOSIS — Z461 Encounter for fitting and adjustment of hearing aid: Secondary | ICD-10-CM | POA: Diagnosis not present

## 2016-02-07 DIAGNOSIS — S82831D Other fracture of upper and lower end of right fibula, subsequent encounter for closed fracture with routine healing: Secondary | ICD-10-CM | POA: Diagnosis not present

## 2016-02-07 DIAGNOSIS — M5416 Radiculopathy, lumbar region: Secondary | ICD-10-CM | POA: Diagnosis not present

## 2016-02-07 DIAGNOSIS — R030 Elevated blood-pressure reading, without diagnosis of hypertension: Secondary | ICD-10-CM | POA: Diagnosis not present

## 2016-02-10 DIAGNOSIS — S82831D Other fracture of upper and lower end of right fibula, subsequent encounter for closed fracture with routine healing: Secondary | ICD-10-CM | POA: Diagnosis not present

## 2016-02-14 DIAGNOSIS — S82831D Other fracture of upper and lower end of right fibula, subsequent encounter for closed fracture with routine healing: Secondary | ICD-10-CM | POA: Diagnosis not present

## 2016-02-17 DIAGNOSIS — S82831D Other fracture of upper and lower end of right fibula, subsequent encounter for closed fracture with routine healing: Secondary | ICD-10-CM | POA: Diagnosis not present

## 2016-02-20 DIAGNOSIS — M25571 Pain in right ankle and joints of right foot: Secondary | ICD-10-CM | POA: Diagnosis not present

## 2016-02-20 DIAGNOSIS — M25871 Other specified joint disorders, right ankle and foot: Secondary | ICD-10-CM | POA: Diagnosis not present

## 2016-02-20 DIAGNOSIS — M25471 Effusion, right ankle: Secondary | ICD-10-CM | POA: Diagnosis not present

## 2016-02-20 DIAGNOSIS — S82831D Other fracture of upper and lower end of right fibula, subsequent encounter for closed fracture with routine healing: Secondary | ICD-10-CM | POA: Diagnosis not present

## 2016-02-21 DIAGNOSIS — S82831D Other fracture of upper and lower end of right fibula, subsequent encounter for closed fracture with routine healing: Secondary | ICD-10-CM | POA: Diagnosis not present

## 2016-02-23 DIAGNOSIS — S82831D Other fracture of upper and lower end of right fibula, subsequent encounter for closed fracture with routine healing: Secondary | ICD-10-CM | POA: Diagnosis not present

## 2016-02-27 DIAGNOSIS — S82831D Other fracture of upper and lower end of right fibula, subsequent encounter for closed fracture with routine healing: Secondary | ICD-10-CM | POA: Diagnosis not present

## 2016-03-01 DIAGNOSIS — S82831D Other fracture of upper and lower end of right fibula, subsequent encounter for closed fracture with routine healing: Secondary | ICD-10-CM | POA: Diagnosis not present

## 2016-03-05 DIAGNOSIS — S82831D Other fracture of upper and lower end of right fibula, subsequent encounter for closed fracture with routine healing: Secondary | ICD-10-CM | POA: Diagnosis not present

## 2016-03-08 DIAGNOSIS — S82831D Other fracture of upper and lower end of right fibula, subsequent encounter for closed fracture with routine healing: Secondary | ICD-10-CM | POA: Diagnosis not present

## 2016-03-12 DIAGNOSIS — S82831D Other fracture of upper and lower end of right fibula, subsequent encounter for closed fracture with routine healing: Secondary | ICD-10-CM | POA: Diagnosis not present

## 2016-03-15 DIAGNOSIS — S82831D Other fracture of upper and lower end of right fibula, subsequent encounter for closed fracture with routine healing: Secondary | ICD-10-CM | POA: Diagnosis not present

## 2016-03-19 DIAGNOSIS — S82831D Other fracture of upper and lower end of right fibula, subsequent encounter for closed fracture with routine healing: Secondary | ICD-10-CM | POA: Diagnosis not present

## 2016-03-22 DIAGNOSIS — S82831D Other fracture of upper and lower end of right fibula, subsequent encounter for closed fracture with routine healing: Secondary | ICD-10-CM | POA: Diagnosis not present

## 2016-03-22 DIAGNOSIS — M25561 Pain in right knee: Secondary | ICD-10-CM | POA: Diagnosis not present

## 2016-03-22 DIAGNOSIS — M25562 Pain in left knee: Secondary | ICD-10-CM | POA: Diagnosis not present

## 2016-03-22 DIAGNOSIS — M25461 Effusion, right knee: Secondary | ICD-10-CM | POA: Diagnosis not present

## 2016-03-23 DIAGNOSIS — H04123 Dry eye syndrome of bilateral lacrimal glands: Secondary | ICD-10-CM | POA: Diagnosis not present

## 2016-03-23 DIAGNOSIS — H52203 Unspecified astigmatism, bilateral: Secondary | ICD-10-CM | POA: Diagnosis not present

## 2016-03-23 DIAGNOSIS — H43813 Vitreous degeneration, bilateral: Secondary | ICD-10-CM | POA: Diagnosis not present

## 2016-03-23 DIAGNOSIS — Z961 Presence of intraocular lens: Secondary | ICD-10-CM | POA: Diagnosis not present

## 2016-04-10 DIAGNOSIS — L570 Actinic keratosis: Secondary | ICD-10-CM | POA: Diagnosis not present

## 2016-04-23 DIAGNOSIS — M7121 Synovial cyst of popliteal space [Baker], right knee: Secondary | ICD-10-CM | POA: Diagnosis not present

## 2016-04-23 DIAGNOSIS — M25561 Pain in right knee: Secondary | ICD-10-CM | POA: Diagnosis not present

## 2016-04-23 DIAGNOSIS — M25571 Pain in right ankle and joints of right foot: Secondary | ICD-10-CM | POA: Diagnosis not present

## 2016-04-24 DIAGNOSIS — M5416 Radiculopathy, lumbar region: Secondary | ICD-10-CM | POA: Diagnosis not present

## 2016-05-01 DIAGNOSIS — Z79899 Other long term (current) drug therapy: Secondary | ICD-10-CM | POA: Diagnosis not present

## 2016-05-07 DIAGNOSIS — M545 Low back pain: Secondary | ICD-10-CM | POA: Diagnosis not present

## 2016-05-07 DIAGNOSIS — M791 Myalgia: Secondary | ICD-10-CM | POA: Diagnosis not present

## 2016-05-07 DIAGNOSIS — E7800 Pure hypercholesterolemia, unspecified: Secondary | ICD-10-CM | POA: Diagnosis not present

## 2016-05-09 DIAGNOSIS — L814 Other melanin hyperpigmentation: Secondary | ICD-10-CM | POA: Diagnosis not present

## 2016-05-09 DIAGNOSIS — L821 Other seborrheic keratosis: Secondary | ICD-10-CM | POA: Diagnosis not present

## 2016-05-09 DIAGNOSIS — D1801 Hemangioma of skin and subcutaneous tissue: Secondary | ICD-10-CM | POA: Diagnosis not present

## 2016-05-09 DIAGNOSIS — Z85820 Personal history of malignant melanoma of skin: Secondary | ICD-10-CM | POA: Diagnosis not present

## 2016-05-09 DIAGNOSIS — D235 Other benign neoplasm of skin of trunk: Secondary | ICD-10-CM | POA: Diagnosis not present

## 2016-05-10 DIAGNOSIS — R200 Anesthesia of skin: Secondary | ICD-10-CM | POA: Diagnosis not present

## 2016-05-14 DIAGNOSIS — R200 Anesthesia of skin: Secondary | ICD-10-CM | POA: Diagnosis not present

## 2016-05-16 ENCOUNTER — Telehealth: Payer: Self-pay | Admitting: Cardiology

## 2016-05-16 NOTE — Telephone Encounter (Signed)
New Message  Pts wife voiced pt has been having leg cramps in both legs and poor circulation in both legs.  Pts wife voiced call in regards to husbands condition he's fell a few times in regards to his legs.  Pts wife voiced he's not having C.P. nor SOB.  Pts wife voiced he's been checked out with other doctors and feel pt needs to see Cardiologist.  Offered 11.22.17 appt with pt but pt declined wanting sooner appt.  Please follow up with pt. Thanks!

## 2016-05-16 NOTE — Telephone Encounter (Signed)
Patient and wife informed

## 2016-05-16 NOTE — Telephone Encounter (Signed)
Please work him in either this week or next when I am at [employer].

## 2016-05-16 NOTE — Telephone Encounter (Signed)
Spoke with wife and patient has been having cramping and leg pain for a while and is getting worse.  Cramps/pain are worse at night. He has fallen secondary to pain Nerve conduction study done by neurology normal PCP did CMET and all within normal limits except glucose around 103. Dr Felipa Eth did stop his Lipitor but suggested following up with cardiology Patient only wants to see Dr Aundra Dubin and has ov 06/20/16 (wife moved her appointment so he could be seen sooner than November) Will forward to Dr Aundra Dubin for further recommendations

## 2016-05-17 ENCOUNTER — Encounter: Payer: Self-pay | Admitting: Cardiology

## 2016-05-18 ENCOUNTER — Encounter: Payer: Self-pay | Admitting: Cardiology

## 2016-05-18 ENCOUNTER — Ambulatory Visit (INDEPENDENT_AMBULATORY_CARE_PROVIDER_SITE_OTHER): Payer: Medicare Other | Admitting: Cardiology

## 2016-05-18 VITALS — BP 128/72 | HR 73 | Ht 69.0 in | Wt 163.0 lb

## 2016-05-18 DIAGNOSIS — E785 Hyperlipidemia, unspecified: Secondary | ICD-10-CM

## 2016-05-18 DIAGNOSIS — I2510 Atherosclerotic heart disease of native coronary artery without angina pectoris: Secondary | ICD-10-CM

## 2016-05-18 DIAGNOSIS — R011 Cardiac murmur, unspecified: Secondary | ICD-10-CM | POA: Diagnosis not present

## 2016-05-18 DIAGNOSIS — M79606 Pain in leg, unspecified: Secondary | ICD-10-CM

## 2016-05-18 DIAGNOSIS — Z79899 Other long term (current) drug therapy: Secondary | ICD-10-CM | POA: Diagnosis not present

## 2016-05-18 LAB — MAGNESIUM: MAGNESIUM: 2.1 mg/dL (ref 1.5–2.5)

## 2016-05-18 LAB — CK: Total CK: 145 U/L (ref 7–232)

## 2016-05-18 MED ORDER — MAGNESIUM OXIDE 400 MG PO CAPS: 400.0000 mg | ORAL_CAPSULE | Freq: Every day | ORAL | 11 refills | Status: AC

## 2016-05-18 MED ORDER — ROSUVASTATIN CALCIUM 5 MG PO TABS: 5.0000 mg | ORAL_TABLET | Freq: Every day | ORAL | 3 refills | Status: DC

## 2016-05-18 NOTE — Patient Instructions (Addendum)
Medication Instructions:  Your physician has recommended you make the following change in your medication:  1) START Magnesium Oxide 400 mg once daily 2) STOP Lipitor 3) START Crestor 5 mg once daily -- begin this one month after stopping Lipitor  Labwork: Magnesium and CPK  Testing/Procedures: Your physician has requested that you have an echocardiogram. Echocardiography is a painless test that uses sound waves to create images of your heart. It provides your doctor with information about the size and shape of your heart and how well your heart's chambers and valves are working. This procedure takes approximately one hour. There are no restrictions for this procedure.  Follow-Up: Your physician recommends that you schedule a follow-up appointment in: 2 months with Dr. Aundra Dubin  If you need a refill on your cardiac medications before your next appointment, please call your pharmacy.  Thank you for choosing [facility]!!

## 2016-05-20 NOTE — Progress Notes (Signed)
Patient ID: Danny [name], male   DOB: 1936/06/18, 80 y.o.   MRN: [id number] PCP: Dr. Felipa Eth  80 yo with history of nonobstructive CAD presents for followup.  He last had an ETT-Cardiolite in 6/16 that was normal.  Echo in 4/15 showed EF 55-60%, mild MR.    Main complaint today is leg pain/cramping at night.  This keeps him up and makes it hard for him to sleep.  He is off atorvastatin but this has not helped.  He eats a lot of mustard.  He feels like he has had a loss of leg muscle strength.  No muscle tenderness.  Otherwise, he has rare atypical chest pain usually at night also.  No exertional dyspnea.   Labs (8/14): K 3.9, creatinine 0.89 Labs (12/14): LDL 62, HDL 48   Labs (6/16): LDL 56 Labs (1/17): creatinine 0.88, HCT 43.9 Labs (8/17): K 4.3, creatinine 0.88, LFTs normal Labs (9/17): CK normal, Mg normal  ECG: NSR, 1st degree AV block.   PMH: 1. Rotator cuff surgery 2. Diverticulosis 3. Chronic persistent hepatitis 4. Impaired fasting glucose 5. 6th nerve palsy in 2001 6. Allergic rhinitis 7. Low back pain s/p several operations.  Most recently had microdiscectomy in 1/17.  8. CAD: Patient developed exertional dyspnea.  ETT-myoview (8/12) with 11 METS, normal LV EF, normal perfusion at rest and stress but elevated TID ratio.  Coronary CT angiogram in 9/12 showed equivocal significant disease in the CFX.  Cath in 10/12 showed EF 55-60%, 30% mid RCA, 40% distal RCA, 40% proximal LAD.  ETT-Cardiolite in 10/14 showed EF 62%, probably normal study with diaphragmatic attenuation.   Echo (4/15) with EF 55-60%, mild MR.  - Cardiolite (6/16) with no ischemia/infarction.   FH: Father with CVA at 9  SH: Married, never smoked, rare ETOH.   ROS: All systems reviewed and negative except as per HPI.   Current Outpatient Prescriptions  Medication Sig Dispense Refill  . aspirin 81 MG tablet Take 81 mg by mouth daily.      . Calcium Citrate-Vitamin D (CITRACAL + D PO) Take 1 tablet by  mouth daily.    . Coenzyme Q10 (CO Q 10 PO) Take 400 mg by mouth daily.    . finasteride (PROSCAR) 5 MG tablet Take 5 mg by mouth daily.      . Multiple Vitamin (MULTIVITAMIN) tablet Take 1 tablet by mouth daily.      . nitroGLYCERIN (NITROSTAT) 0.4 MG SL tablet Place 1 tablet (0.4 mg total) under the tongue every 5 (five) minutes as needed for chest pain. 25 tablet 12  . omeprazole (PRILOSEC) 20 MG capsule Take 1 tablet by mouth two times a day 30 capsule 11  . pregabalin (LYRICA) 100 MG capsule Take 100-200 mg by mouth at bedtime.     . RESTASIS 0.05 % ophthalmic emulsion Place 1 drop into both eyes 2 (two) times daily.     . Saw Palmetto, Serenoa repens, (SAW PALMETTO PO) Take 1 tablet by mouth 2 (two) times daily.    . Wheat Dextrin (BENEFIBER PO) Take 1 Dose by mouth daily as needed.    . Magnesium Oxide 400 MG CAPS Take 1 capsule (400 mg total) by mouth daily. 30 capsule 11  . rosuvastatin (CRESTOR) 5 MG tablet Take 1 tablet (5 mg total) by mouth daily. 90 tablet 3   No current facility-administered medications for this visit.     BP 128/72   Pulse 73   Ht 5\' 9"  (1.753 m)  Wt 163 lb (73.9 kg)   BMI 24.07 kg/m  General: NAD Neck: No JVD, no thyromegaly or thyroid nodule.  Lungs: Clear to auscultation bilaterally with normal respiratory effort. CV: Nondisplaced PMI.  Heart regular S1/S2, no S3/S4, 3/6 HSM apex.  No peripheral edema.  No carotid bruit.  Normal pedal pulses.  Abdomen: Soft, nontender, no hepatosplenomegaly, no distention.  Skin: Intact without lesions or rashes.  Neurologic: Alert and oriented x 3.  Psych: Normal affect. Extremities: No clubbing or cyanosis.   Assessment/Plan:  1. CAD: Nonobstructive on prior cath.  Low risk Cardiolite in 6/16. Occasional atypical chest pain, no change from past.   - Continue ASA 81.  2. Hyperlipidemia: Goal LDL < 70 with known CAD.  However, he is not off atorvastatin with leg pain/weakness.  Stopping statin has not helped  symptoms.  Would stay off statin for 1 month then start back on rosuvastatin 5 mg daily.   3. Murmur: MR-type murmur on exam, seems louder than in the past.  I will arrange for echo to assess MR. 4. Leg pain/cramping: He has had nerve conduction study that was normal.  Stopping atorvastatin did not help.  CK and Mg checked today were normal. He has good pedal pulses so doubt significant PAD. No definite explanation for his symptoms at this point.  Followup in 2 months.   Loralie Champagne 05/20/2016

## 2016-05-22 NOTE — Addendum Note (Signed)
Addended by: Marlis Edelson C on: 05/22/2016 10:54 AM   Modules accepted: Orders

## 2016-05-28 ENCOUNTER — Ambulatory Visit: Payer: Medicare Other | Admitting: Cardiology

## 2016-06-01 DIAGNOSIS — S82831D Other fracture of upper and lower end of right fibula, subsequent encounter for closed fracture with routine healing: Secondary | ICD-10-CM | POA: Diagnosis not present

## 2016-06-01 DIAGNOSIS — M25571 Pain in right ankle and joints of right foot: Secondary | ICD-10-CM | POA: Diagnosis not present

## 2016-06-01 DIAGNOSIS — M7121 Synovial cyst of popliteal space [Baker], right knee: Secondary | ICD-10-CM | POA: Diagnosis not present

## 2016-06-01 DIAGNOSIS — M25561 Pain in right knee: Secondary | ICD-10-CM | POA: Diagnosis not present

## 2016-06-04 ENCOUNTER — Other Ambulatory Visit: Payer: Self-pay

## 2016-06-04 ENCOUNTER — Ambulatory Visit (HOSPITAL_COMMUNITY): Payer: Medicare Other | Attending: Cardiology

## 2016-06-04 DIAGNOSIS — I77819 Aortic ectasia, unspecified site: Secondary | ICD-10-CM | POA: Insufficient documentation

## 2016-06-04 DIAGNOSIS — I071 Rheumatic tricuspid insufficiency: Secondary | ICD-10-CM | POA: Insufficient documentation

## 2016-06-04 DIAGNOSIS — I340 Nonrheumatic mitral (valve) insufficiency: Secondary | ICD-10-CM | POA: Insufficient documentation

## 2016-06-04 DIAGNOSIS — I350 Nonrheumatic aortic (valve) stenosis: Secondary | ICD-10-CM | POA: Insufficient documentation

## 2016-06-04 DIAGNOSIS — I2510 Atherosclerotic heart disease of native coronary artery without angina pectoris: Secondary | ICD-10-CM | POA: Diagnosis not present

## 2016-06-04 DIAGNOSIS — E785 Hyperlipidemia, unspecified: Secondary | ICD-10-CM | POA: Diagnosis not present

## 2016-06-20 ENCOUNTER — Ambulatory Visit: Payer: Medicare Other | Admitting: Cardiology

## 2016-06-22 DIAGNOSIS — M5416 Radiculopathy, lumbar region: Secondary | ICD-10-CM | POA: Diagnosis not present

## 2016-06-27 ENCOUNTER — Other Ambulatory Visit: Payer: Self-pay | Admitting: Neurological Surgery

## 2016-06-27 DIAGNOSIS — M5416 Radiculopathy, lumbar region: Secondary | ICD-10-CM

## 2016-07-11 DIAGNOSIS — R350 Frequency of micturition: Secondary | ICD-10-CM | POA: Diagnosis not present

## 2016-07-12 ENCOUNTER — Ambulatory Visit
Admission: RE | Admit: 2016-07-12 | Discharge: 2016-07-12 | Disposition: A | Discharge status: Home / Self Care | Payer: Medicare Other | Source: Ambulatory Visit | Attending: Neurological Surgery | Admitting: Neurological Surgery

## 2016-07-12 VITALS — BP 111/74 | HR 64

## 2016-07-12 DIAGNOSIS — M5416 Radiculopathy, lumbar region: Secondary | ICD-10-CM

## 2016-07-12 DIAGNOSIS — M48061 Spinal stenosis, lumbar region without neurogenic claudication: Secondary | ICD-10-CM | POA: Diagnosis not present

## 2016-07-12 DIAGNOSIS — Z9889 Other specified postprocedural states: Secondary | ICD-10-CM

## 2016-07-12 MED ORDER — IOPAMIDOL (ISOVUE-M 200) INJECTION 41%
15.0000 mL | Freq: Once | INTRAMUSCULAR | Status: AC
  Administered 2016-07-12: 15 mL via INTRATHECAL

## 2016-07-12 MED ORDER — DIAZEPAM 5 MG PO TABS
5.0000 mg | ORAL_TABLET | Freq: Once | ORAL | Status: AC
  Administered 2016-07-12: 5 mg via ORAL

## 2016-07-12 NOTE — Discharge Instructions (Signed)

## 2016-07-16 ENCOUNTER — Telehealth: Payer: Self-pay

## 2016-07-16 NOTE — Telephone Encounter (Signed)
Spoke with patient after he had a myelogram here 07/12/16.  He states he's doing just fine.  Denies any headache.  jkl

## 2016-07-24 DIAGNOSIS — M48061 Spinal stenosis, lumbar region without neurogenic claudication: Secondary | ICD-10-CM | POA: Diagnosis not present

## 2016-08-01 DIAGNOSIS — M792 Neuralgia and neuritis, unspecified: Secondary | ICD-10-CM | POA: Diagnosis not present

## 2016-08-08 ENCOUNTER — Encounter: Payer: Self-pay | Admitting: Cardiology

## 2016-08-08 ENCOUNTER — Ambulatory Visit (INDEPENDENT_AMBULATORY_CARE_PROVIDER_SITE_OTHER): Payer: Medicare Other | Admitting: Cardiology

## 2016-08-08 VITALS — BP 124/62 | HR 80 | Ht 69.0 in | Wt 165.0 lb

## 2016-08-08 DIAGNOSIS — I2510 Atherosclerotic heart disease of native coronary artery without angina pectoris: Secondary | ICD-10-CM

## 2016-08-08 DIAGNOSIS — E785 Hyperlipidemia, unspecified: Secondary | ICD-10-CM

## 2016-08-08 DIAGNOSIS — I350 Nonrheumatic aortic (valve) stenosis: Secondary | ICD-10-CM | POA: Diagnosis not present

## 2016-08-08 NOTE — Patient Instructions (Signed)
Medication Instructions:  Your physician recommends that you continue on your current medications as directed. Please refer to the Current Medication list given to you today.   Labwork: Lipid profile/liver profile today  Testing/Procedures: None   Follow-Up: Your physician wants you to follow-up in: 1 year with Dr Aundra Dubin in the [facility], [phone], (November 2018).  You will receive a reminder letter in the mail two months in advance. If you don't receive a letter, please call our office to schedule the follow-up appointment.        If you need a refill on your cardiac medications before your next appointment, please call your pharmacy.

## 2016-08-09 LAB — HEPATIC FUNCTION PANEL
ALT: 21 U/L (ref 9–46)
AST: 21 U/L (ref 10–35)
Albumin: 4.1 g/dL (ref 3.6–5.1)
Alkaline Phosphatase: 51 U/L (ref 40–115)
BILIRUBIN DIRECT: 0.1 mg/dL (ref <=0.2)
BILIRUBIN INDIRECT: 0.4 mg/dL (ref 0.2–1.2)
TOTAL PROTEIN: 6.1 g/dL (ref 6.1–8.1)
Total Bilirubin: 0.5 mg/dL (ref 0.2–1.2)

## 2016-08-09 LAB — LIPID PANEL
CHOL/HDL RATIO: 2.3 ratio (ref <5.0)
CHOLESTEROL: 117 mg/dL (ref <200)
HDL: 52 mg/dL (ref >40)
LDL Cholesterol: 32 mg/dL (ref <100)
TRIGLYCERIDES: 166 mg/dL — AB (ref <150)
VLDL: 33 mg/dL — AB (ref <30)

## 2016-08-10 NOTE — Progress Notes (Signed)
Patient ID: Danny [name], male   DOB: [birth year], 80 y.o.   MRN: [id number] PCP: Dr. Felipa Eth  80 yo with history of nonobstructive CAD presents for followup.  He last had an ETT-Cardiolite in 6/16 that was normal.  Echo in 9/17 showed EF 55-60%, mild aortic stenosis, 4.0 cm ascending aorta.    At last visit, he was having disabling leg cramps.  CK was normal.  I stopped atorvastatin and switched him over to Crestor.  I also started him on magnesium oxide.  Since then, leg cramps have completely resolved and he feels much better.  No exertional chest pain or dyspnea.    Labs (8/14): K 3.9, creatinine 0.89 Labs (12/14): LDL 62, HDL 48   Labs (6/16): LDL 56 Labs (1/17): creatinine 0.88, HCT 43.9 Labs (8/17): K 4.3, creatinine 0.88, LFTs normal Labs (9/17): CK normal, Mg normal  ECG: NSR, 1st degree AV block.   PMH: 1. Rotator cuff surgery 2. Diverticulosis 3. Chronic persistent hepatitis 4. Impaired fasting glucose 5. 6th nerve palsy in 2001 6. Allergic rhinitis 7. Low back pain s/p several operations.  Most recently had microdiscectomy in 1/17.  8. CAD: Patient developed exertional dyspnea.  ETT-myoview (8/12) with 11 METS, normal LV EF, normal perfusion at rest and stress but elevated TID ratio.  Coronary CT angiogram in 9/12 showed equivocal significant disease in the CFX.  Cath in 10/12 showed EF 55-60%, 30% mid RCA, 40% distal RCA, 40% proximal LAD.  ETT-Cardiolite in 10/14 showed EF 62%, probably normal study with diaphragmatic attenuation.   Echo (4/15) with EF 55-60%, mild MR.  - Cardiolite (6/16) with no ischemia/infarction.  - Echo (9/17) with EF 55-60%, mild aortic stenosis, 4.0 cm ascending aorta.  9. GERD 10. Hyperlipidemia: Myalgias with atorvastatin.  11. Aortic stenosis: Mild on 9/17 echo.   FH: Father with CVA at 54  SH: Married, never smoked, rare ETOH.   ROS: All systems reviewed and negative except as per HPI.   Current Outpatient Prescriptions  Medication  Sig Dispense Refill  . aspirin 81 MG tablet Take 81 mg by mouth daily.      . Calcium Citrate-Vitamin D (CITRACAL + D PO) Take 1 tablet by mouth daily.    . Coenzyme Q10 (CO Q 10 PO) Take 400 mg by mouth daily.    . finasteride (PROSCAR) 5 MG tablet Take 5 mg by mouth daily.      . Magnesium Oxide 400 MG CAPS Take 1 capsule (400 mg total) by mouth daily. 30 capsule 11  . Multiple Vitamin (MULTIVITAMIN) tablet Take 1 tablet by mouth daily.      . nitroGLYCERIN (NITROSTAT) 0.4 MG SL tablet Place 1 tablet (0.4 mg total) under the tongue every 5 (five) minutes as needed for chest pain. 25 tablet 12  . omeprazole (PRILOSEC) 20 MG capsule Take 1 tablet by mouth two times a day 30 capsule 11  . pregabalin (LYRICA) 100 MG capsule Take 100-200 mg by mouth at bedtime.     . RESTASIS 0.05 % ophthalmic emulsion Place 1 drop into both eyes 2 (two) times daily.     . rosuvastatin (CRESTOR) 5 MG tablet Take 1 tablet (5 mg total) by mouth daily. 90 tablet 3  . Saw Palmetto, Serenoa repens, (SAW PALMETTO PO) Take 1 tablet by mouth 2 (two) times daily.    . Wheat Dextrin (BENEFIBER PO) Take 1 Dose by mouth daily as needed.     No current facility-administered medications for this visit.  BP 124/62   Pulse 80   Ht 5\' 9"  (1.753 m)   Wt 165 lb (74.8 kg)   BMI 24.37 kg/m  General: NAD Neck: No JVD, no thyromegaly or thyroid nodule.  Lungs: Clear to auscultation bilaterally with normal respiratory effort. CV: Nondisplaced PMI.  Heart regular S1/S2, no S3/S4, 2/6 SEM RUSB.  No peripheral edema.  No carotid bruit.  Normal pedal pulses.  Abdomen: Soft, nontender, no hepatosplenomegaly, no distention.  Skin: Intact without lesions or rashes.  Neurologic: Alert and oriented x 3.  Psych: Normal affect. Extremities: No clubbing or cyanosis.   Assessment/Plan:  1. CAD: Nonobstructive on prior cath.  Low risk Cardiolite in 6/16. Occasional atypical chest pain, no change from past.   - Continue ASA 81.  2.  Hyperlipidemia: Goal LDL < 70 with known CAD.  Myalgias with atorvastatin, now tolerating Crestor.  Will check lipids/LFTs today.  3. Aortic stenosis: Mild on 9/17 echo.  Would repeat echo in a couple of years.   4. Ascending aortic aneurysm: Mild, 4.0 cm on 9/17 echo.  Would obtain MRA chest in 9/18.   Followup 1 year in Heart/Vascular clinic with me.    Loralie Champagne 08/10/2016

## 2016-08-21 DIAGNOSIS — M7541 Impingement syndrome of right shoulder: Secondary | ICD-10-CM | POA: Diagnosis not present

## 2016-08-21 DIAGNOSIS — M25511 Pain in right shoulder: Secondary | ICD-10-CM | POA: Diagnosis not present

## 2016-08-30 DIAGNOSIS — M5416 Radiculopathy, lumbar region: Secondary | ICD-10-CM | POA: Diagnosis not present

## 2016-09-19 DIAGNOSIS — M25511 Pain in right shoulder: Secondary | ICD-10-CM | POA: Diagnosis not present

## 2016-09-19 DIAGNOSIS — M7541 Impingement syndrome of right shoulder: Secondary | ICD-10-CM | POA: Diagnosis not present

## 2016-10-16 DIAGNOSIS — Z125 Encounter for screening for malignant neoplasm of prostate: Secondary | ICD-10-CM | POA: Diagnosis not present

## 2016-10-16 DIAGNOSIS — N401 Enlarged prostate with lower urinary tract symptoms: Secondary | ICD-10-CM | POA: Diagnosis not present

## 2016-10-16 DIAGNOSIS — M545 Low back pain: Secondary | ICD-10-CM | POA: Diagnosis not present

## 2016-10-16 DIAGNOSIS — Z79899 Other long term (current) drug therapy: Secondary | ICD-10-CM | POA: Diagnosis not present

## 2016-10-16 DIAGNOSIS — K219 Gastro-esophageal reflux disease without esophagitis: Secondary | ICD-10-CM | POA: Diagnosis not present

## 2016-10-16 DIAGNOSIS — Z0000 Encounter for general adult medical examination without abnormal findings: Secondary | ICD-10-CM | POA: Diagnosis not present

## 2016-10-16 DIAGNOSIS — E7800 Pure hypercholesterolemia, unspecified: Secondary | ICD-10-CM | POA: Diagnosis not present

## 2016-10-16 DIAGNOSIS — Z1389 Encounter for screening for other disorder: Secondary | ICD-10-CM | POA: Diagnosis not present

## 2016-11-07 DIAGNOSIS — D235 Other benign neoplasm of skin of trunk: Secondary | ICD-10-CM | POA: Diagnosis not present

## 2016-11-07 DIAGNOSIS — L821 Other seborrheic keratosis: Secondary | ICD-10-CM | POA: Diagnosis not present

## 2016-11-07 DIAGNOSIS — D1801 Hemangioma of skin and subcutaneous tissue: Secondary | ICD-10-CM | POA: Diagnosis not present

## 2016-11-07 DIAGNOSIS — Z85820 Personal history of malignant melanoma of skin: Secondary | ICD-10-CM | POA: Diagnosis not present

## 2016-11-21 DIAGNOSIS — H26493 Other secondary cataract, bilateral: Secondary | ICD-10-CM | POA: Diagnosis not present

## 2016-11-21 DIAGNOSIS — H04123 Dry eye syndrome of bilateral lacrimal glands: Secondary | ICD-10-CM | POA: Diagnosis not present

## 2016-11-21 DIAGNOSIS — Z961 Presence of intraocular lens: Secondary | ICD-10-CM | POA: Diagnosis not present

## 2016-11-21 DIAGNOSIS — H52203 Unspecified astigmatism, bilateral: Secondary | ICD-10-CM | POA: Diagnosis not present

## 2016-11-26 DIAGNOSIS — M5416 Radiculopathy, lumbar region: Secondary | ICD-10-CM | POA: Diagnosis not present

## 2016-12-27 DIAGNOSIS — H26492 Other secondary cataract, left eye: Secondary | ICD-10-CM | POA: Diagnosis not present

## 2016-12-31 DIAGNOSIS — R030 Elevated blood-pressure reading, without diagnosis of hypertension: Secondary | ICD-10-CM | POA: Diagnosis not present

## 2016-12-31 DIAGNOSIS — M5416 Radiculopathy, lumbar region: Secondary | ICD-10-CM | POA: Diagnosis not present

## 2017-01-10 DIAGNOSIS — H26491 Other secondary cataract, right eye: Secondary | ICD-10-CM | POA: Diagnosis not present

## 2017-01-15 ENCOUNTER — Other Ambulatory Visit: Payer: Self-pay | Admitting: Cardiology

## 2017-01-22 DIAGNOSIS — M5416 Radiculopathy, lumbar region: Secondary | ICD-10-CM | POA: Diagnosis not present

## 2017-02-20 DIAGNOSIS — M48061 Spinal stenosis, lumbar region without neurogenic claudication: Secondary | ICD-10-CM | POA: Diagnosis not present

## 2017-02-20 DIAGNOSIS — M5416 Radiculopathy, lumbar region: Secondary | ICD-10-CM | POA: Diagnosis not present

## 2017-02-20 DIAGNOSIS — R030 Elevated blood-pressure reading, without diagnosis of hypertension: Secondary | ICD-10-CM | POA: Diagnosis not present

## 2017-03-15 DIAGNOSIS — H903 Sensorineural hearing loss, bilateral: Secondary | ICD-10-CM | POA: Diagnosis not present

## 2017-04-30 DIAGNOSIS — M5416 Radiculopathy, lumbar region: Secondary | ICD-10-CM | POA: Diagnosis not present

## 2017-05-03 DIAGNOSIS — M545 Low back pain: Secondary | ICD-10-CM | POA: Diagnosis not present

## 2017-05-03 DIAGNOSIS — D692 Other nonthrombocytopenic purpura: Secondary | ICD-10-CM | POA: Diagnosis not present

## 2017-05-03 DIAGNOSIS — Z0000 Encounter for general adult medical examination without abnormal findings: Secondary | ICD-10-CM | POA: Diagnosis not present

## 2017-05-03 DIAGNOSIS — Z125 Encounter for screening for malignant neoplasm of prostate: Secondary | ICD-10-CM | POA: Diagnosis not present

## 2017-05-03 DIAGNOSIS — I350 Nonrheumatic aortic (valve) stenosis: Secondary | ICD-10-CM | POA: Diagnosis not present

## 2017-05-03 DIAGNOSIS — K219 Gastro-esophageal reflux disease without esophagitis: Secondary | ICD-10-CM | POA: Diagnosis not present

## 2017-05-03 DIAGNOSIS — G8929 Other chronic pain: Secondary | ICD-10-CM | POA: Diagnosis not present

## 2017-05-03 DIAGNOSIS — R682 Dry mouth, unspecified: Secondary | ICD-10-CM | POA: Diagnosis not present

## 2017-05-28 DIAGNOSIS — R030 Elevated blood-pressure reading, without diagnosis of hypertension: Secondary | ICD-10-CM | POA: Diagnosis not present

## 2017-05-28 DIAGNOSIS — M48061 Spinal stenosis, lumbar region without neurogenic claudication: Secondary | ICD-10-CM | POA: Diagnosis not present

## 2017-05-28 DIAGNOSIS — M5416 Radiculopathy, lumbar region: Secondary | ICD-10-CM | POA: Diagnosis not present

## 2017-06-04 DIAGNOSIS — M545 Low back pain: Secondary | ICD-10-CM | POA: Diagnosis not present

## 2017-06-11 DIAGNOSIS — M545 Low back pain: Secondary | ICD-10-CM | POA: Diagnosis not present

## 2017-06-17 DIAGNOSIS — M545 Low back pain: Secondary | ICD-10-CM | POA: Diagnosis not present

## 2017-07-08 DIAGNOSIS — Z23 Encounter for immunization: Secondary | ICD-10-CM | POA: Diagnosis not present

## 2017-07-30 ENCOUNTER — Encounter (HOSPITAL_COMMUNITY): Payer: Self-pay | Admitting: Cardiology

## 2017-07-30 ENCOUNTER — Other Ambulatory Visit: Payer: Self-pay

## 2017-07-30 ENCOUNTER — Ambulatory Visit (HOSPITAL_COMMUNITY)
Admission: RE | Admit: 2017-07-30 | Discharge: 2017-07-30 | Disposition: A | Discharge status: Home / Self Care | Payer: Medicare Other | Source: Ambulatory Visit | Attending: Cardiology | Admitting: Cardiology

## 2017-07-30 VITALS — BP 130/79 | HR 63 | Wt 158.8 lb

## 2017-07-30 DIAGNOSIS — K730 Chronic persistent hepatitis, not elsewhere classified: Secondary | ICD-10-CM | POA: Insufficient documentation

## 2017-07-30 DIAGNOSIS — I2510 Atherosclerotic heart disease of native coronary artery without angina pectoris: Secondary | ICD-10-CM | POA: Insufficient documentation

## 2017-07-30 DIAGNOSIS — M791 Myalgia, unspecified site: Secondary | ICD-10-CM | POA: Insufficient documentation

## 2017-07-30 DIAGNOSIS — K5790 Diverticulosis of intestine, part unspecified, without perforation or abscess without bleeding: Secondary | ICD-10-CM | POA: Insufficient documentation

## 2017-07-30 DIAGNOSIS — I719 Aortic aneurysm of unspecified site, without rupture: Secondary | ICD-10-CM

## 2017-07-30 DIAGNOSIS — Z7982 Long term (current) use of aspirin: Secondary | ICD-10-CM | POA: Insufficient documentation

## 2017-07-30 DIAGNOSIS — K219 Gastro-esophageal reflux disease without esophagitis: Secondary | ICD-10-CM | POA: Diagnosis not present

## 2017-07-30 DIAGNOSIS — R7301 Impaired fasting glucose: Secondary | ICD-10-CM | POA: Insufficient documentation

## 2017-07-30 DIAGNOSIS — I350 Nonrheumatic aortic (valve) stenosis: Secondary | ICD-10-CM | POA: Diagnosis not present

## 2017-07-30 DIAGNOSIS — Z79899 Other long term (current) drug therapy: Secondary | ICD-10-CM | POA: Insufficient documentation

## 2017-07-30 DIAGNOSIS — M545 Low back pain: Secondary | ICD-10-CM | POA: Insufficient documentation

## 2017-07-30 DIAGNOSIS — E785 Hyperlipidemia, unspecified: Secondary | ICD-10-CM | POA: Diagnosis not present

## 2017-07-30 DIAGNOSIS — J309 Allergic rhinitis, unspecified: Secondary | ICD-10-CM | POA: Diagnosis not present

## 2017-07-30 DIAGNOSIS — Z9889 Other specified postprocedural states: Secondary | ICD-10-CM | POA: Insufficient documentation

## 2017-07-30 DIAGNOSIS — I712 Thoracic aortic aneurysm, without rupture: Secondary | ICD-10-CM | POA: Diagnosis not present

## 2017-07-30 LAB — HEPATIC FUNCTION PANEL
ALK PHOS: 53 U/L (ref 38–126)
ALT: 26 U/L (ref 17–63)
AST: 24 U/L (ref 15–41)
Albumin: 3.9 g/dL (ref 3.5–5.0)
BILIRUBIN DIRECT: 0.1 mg/dL (ref 0.1–0.5)
BILIRUBIN INDIRECT: 0.8 mg/dL (ref 0.3–0.9)
Total Bilirubin: 0.9 mg/dL (ref 0.3–1.2)
Total Protein: 6.3 g/dL — ABNORMAL LOW (ref 6.5–8.1)

## 2017-07-30 LAB — LIPID PANEL
CHOL/HDL RATIO: 2.2 ratio
Cholesterol: 116 mg/dL (ref 0–200)
HDL: 53 mg/dL (ref >40)
LDL CALC: 50 mg/dL (ref 0–99)
TRIGLYCERIDES: 64 mg/dL (ref <150)
VLDL: 13 mg/dL (ref 0–40)

## 2017-07-30 NOTE — Progress Notes (Signed)
Patient ID: Danny [name], male   DOB: [birth year], 81 y.o.   MRN: [id number] PCP: Dr. Felipa Eth Cardiology: Dr. Aundra Dubin  81 y.o. yo with history of nonobstructive CAD presents for followup of CAD and aortic aneurysm.  He last had an ETT-Cardiolite in 6/16 that was normal.  Echo in 9/17 showed EF 55-60%, mild aortic stenosis, 4.0 cm ascending aorta.    He has been doing well recently.  He is tolerating Crestor without muscle pain.  Some generalized fatigue.  Golfs twice a week.  No significant exertional dyspnea or chest pain.  Limited mostly by low back pain.   ECG (personally reviewed): NSR, 1st degree AVB  Labs (8/14): K 3.9, creatinine 0.89 Labs (12/14): LDL 62, HDL 48   Labs (6/16): LDL 56 Labs (1/17): creatinine 0.88, HCT 43.9 Labs (8/17): K 4.3, creatinine 0.88, LFTs normal Labs (9/17): CK normal, Mg normal Labs (11/17): LDL 32, HDL 52  PMH: 1. Rotator cuff surgery 2. Diverticulosis 3. Chronic persistent hepatitis 4. Impaired fasting glucose 5. 6th nerve palsy in 2001 6. Allergic rhinitis 7. Low back pain s/p several operations.  Most recently had microdiscectomy in 1/17.  8. CAD: Patient developed exertional dyspnea.  ETT-myoview (8/12) with 11 METS, normal LV EF, normal perfusion at rest and stress but elevated TID ratio.  Coronary CT angiogram in 9/12 showed equivocal significant disease in the CFX.  Cath in 10/12 showed EF 55-60%, 30% mid RCA, 40% distal RCA, 40% proximal LAD.  ETT-Cardiolite in 10/14 showed EF 62%, probably normal study with diaphragmatic attenuation.   Echo (4/15) with EF 55-60%, mild MR.  - Cardiolite (6/16) with no ischemia/infarction.  - Echo (9/17) with EF 55-60%, mild aortic stenosis, 4.0 cm ascending aorta.  9. GERD 10. Hyperlipidemia: Myalgias with atorvastatin.  11. Aortic stenosis: Mild on 9/17 echo.   FH: Father with CVA at 71  SH: Married, never smoked, rare ETOH.   ROS: All systems reviewed and negative except as per HPI.   Current  Outpatient Medications  Medication Sig Dispense Refill  . aspirin 81 MG tablet Take 81 mg by mouth daily.      . Calcium Citrate-Vitamin D (CITRACAL + D PO) Take 1 tablet by mouth daily.    . Coenzyme Q10 (CO Q 10 PO) Take 400 mg by mouth daily.    . finasteride (PROSCAR) 5 MG tablet Take 5 mg by mouth daily.      . Magnesium Oxide 400 MG CAPS Take 1 capsule (400 mg total) by mouth daily. 30 capsule 11  . Multiple Vitamin (MULTIVITAMIN) tablet Take 1 tablet by mouth daily.      . nitroGLYCERIN (NITROSTAT) 0.4 MG SL tablet Place 1 tablet (0.4 mg total) under the tongue every 5 (five) minutes as needed for chest pain. 25 tablet 12  . pregabalin (LYRICA) 100 MG capsule Take 100-200 mg by mouth at bedtime.     . ranitidine (ZANTAC) 150 MG tablet Take 150 mg 2 (two) times daily by mouth.    . RESTASIS 0.05 % ophthalmic emulsion Place 1 drop into both eyes 2 (two) times daily.     . rosuvastatin (CRESTOR) 5 MG tablet TAKE 1 TABLET ONCE DAILY. 90 tablet 3  . Saw Palmetto, Serenoa repens, (SAW PALMETTO PO) Take 1 tablet by mouth 2 (two) times daily.    . tamsulosin (FLOMAX) 0.4 MG CAPS capsule Take 0.4 mg by mouth.    . traMADol (ULTRAM) 50 MG tablet Take 50 mg 2 (two) times daily by  mouth.    . Turmeric 500 MG CAPS Take 1,000 mg by mouth.    . Wheat Dextrin (BENEFIBER PO) Take 1 Dose by mouth daily as needed.     No current facility-administered medications for this encounter.     BP 130/79   Pulse 63   Wt 158 lb 12.8 oz (72 kg)   SpO2 98%   BMI 23.45 kg/m  General: NAD Neck: No JVD, no thyromegaly or thyroid nodule.  Lungs: Clear to auscultation bilaterally with normal respiratory effort. CV: Nondisplaced PMI.  Heart regular S1/S2, no S3/S4, 1/6 SEM RUSB.  No peripheral edema.  No carotid bruit.  Normal pedal pulses.  Abdomen: Soft, nontender, no hepatosplenomegaly, no distention.  Skin: Intact without lesions or rashes.  Neurologic: Alert and oriented x 3.  Psych: Normal  affect. Extremities: No clubbing or cyanosis.  HEENT: Normal.   Assessment/Plan:  1. CAD: Nonobstructive on prior cath.  Low risk Cardiolite in 6/16. No chest pain. - Continue ASA 81.  2. Hyperlipidemia: Goal LDL < 70 with known CAD.  Myalgias with atorvastatin, now tolerating Crestor.  - Check lipids/LFTs today.  3. Aortic stenosis: Mild on 9/17 echo.  Would repeat echo next year.   4. Ascending aortic aneurysm: Mild, 4.0 cm on 9/17 echo.  I will arrange for MRA chest to follow aneurysm size.    Followup 1 year in Heart/Vascular clinic   Loralie Champagne 07/30/2017

## 2017-07-30 NOTE — Patient Instructions (Addendum)
abs drawn today (if we do not call you, then your lab work was stable)   Your physician has requested that you have an abdominal aorta duplex. During this test, an ultrasound is used to evaluate the aorta. Allow 30 minutes for this exam. Do not eat after midnight the day before and avoid carbonated beverages  Labs drawn today (if we do not call you, then your lab work was stable)   Your physician recommends that you schedule a follow-up appointment in: 1 year with Dr. Aundra Dubin  (we will call you, or you can call us in October 2019)

## 2017-08-06 DIAGNOSIS — M47817 Spondylosis without myelopathy or radiculopathy, lumbosacral region: Secondary | ICD-10-CM | POA: Diagnosis not present

## 2017-08-14 ENCOUNTER — Encounter (HOSPITAL_COMMUNITY): Payer: Self-pay

## 2017-08-20 ENCOUNTER — Ambulatory Visit (HOSPITAL_COMMUNITY)
Admission: RE | Admit: 2017-08-20 | Discharge: 2017-08-20 | Disposition: A | Discharge status: Home / Self Care | Payer: Medicare Other | Source: Ambulatory Visit | Attending: Cardiology | Admitting: Cardiology

## 2017-08-20 DIAGNOSIS — I719 Aortic aneurysm of unspecified site, without rupture: Secondary | ICD-10-CM | POA: Diagnosis not present

## 2017-08-20 DIAGNOSIS — I712 Thoracic aortic aneurysm, without rupture: Secondary | ICD-10-CM | POA: Diagnosis not present

## 2017-08-20 LAB — CREATININE, SERUM
Creatinine, Ser: 0.84 mg/dL (ref 0.61–1.24)
GFR calc non Af Amer: >60 mL/min (ref >60)

## 2017-08-20 MED ORDER — GADOBENATE DIMEGLUMINE 529 MG/ML IV SOLN: 20.0000 mL | Freq: Once | INTRAVENOUS | Status: DC

## 2017-10-18 DIAGNOSIS — F5101 Primary insomnia: Secondary | ICD-10-CM | POA: Diagnosis not present

## 2017-10-18 DIAGNOSIS — R5383 Other fatigue: Secondary | ICD-10-CM | POA: Diagnosis not present

## 2017-10-18 DIAGNOSIS — G8929 Other chronic pain: Secondary | ICD-10-CM | POA: Diagnosis not present

## 2017-10-18 DIAGNOSIS — Z0000 Encounter for general adult medical examination without abnormal findings: Secondary | ICD-10-CM | POA: Diagnosis not present

## 2017-10-18 DIAGNOSIS — R7303 Prediabetes: Secondary | ICD-10-CM | POA: Diagnosis not present

## 2017-10-18 DIAGNOSIS — M5442 Lumbago with sciatica, left side: Secondary | ICD-10-CM | POA: Diagnosis not present

## 2017-10-18 DIAGNOSIS — E7800 Pure hypercholesterolemia, unspecified: Secondary | ICD-10-CM | POA: Diagnosis not present

## 2017-10-18 DIAGNOSIS — Z79899 Other long term (current) drug therapy: Secondary | ICD-10-CM | POA: Diagnosis not present

## 2017-10-18 DIAGNOSIS — Z1389 Encounter for screening for other disorder: Secondary | ICD-10-CM | POA: Diagnosis not present

## 2017-10-18 DIAGNOSIS — R110 Nausea: Secondary | ICD-10-CM | POA: Diagnosis not present

## 2017-10-18 DIAGNOSIS — I350 Nonrheumatic aortic (valve) stenosis: Secondary | ICD-10-CM | POA: Diagnosis not present

## 2017-10-22 DIAGNOSIS — M47817 Spondylosis without myelopathy or radiculopathy, lumbosacral region: Secondary | ICD-10-CM | POA: Diagnosis not present

## 2017-11-13 DIAGNOSIS — D1801 Hemangioma of skin and subcutaneous tissue: Secondary | ICD-10-CM | POA: Diagnosis not present

## 2017-11-13 DIAGNOSIS — D229 Melanocytic nevi, unspecified: Secondary | ICD-10-CM | POA: Diagnosis not present

## 2017-11-13 DIAGNOSIS — L821 Other seborrheic keratosis: Secondary | ICD-10-CM | POA: Diagnosis not present

## 2017-11-13 DIAGNOSIS — L814 Other melanin hyperpigmentation: Secondary | ICD-10-CM | POA: Diagnosis not present

## 2017-11-13 DIAGNOSIS — Z85820 Personal history of malignant melanoma of skin: Secondary | ICD-10-CM | POA: Diagnosis not present

## 2017-11-18 DIAGNOSIS — R351 Nocturia: Secondary | ICD-10-CM | POA: Diagnosis not present

## 2017-11-19 DIAGNOSIS — M47817 Spondylosis without myelopathy or radiculopathy, lumbosacral region: Secondary | ICD-10-CM | POA: Diagnosis not present

## 2017-12-12 DIAGNOSIS — H0014 Chalazion left upper eyelid: Secondary | ICD-10-CM | POA: Diagnosis not present

## 2017-12-12 DIAGNOSIS — H5712 Ocular pain, left eye: Secondary | ICD-10-CM | POA: Diagnosis not present

## 2017-12-12 DIAGNOSIS — T1502XA Foreign body in cornea, left eye, initial encounter: Secondary | ICD-10-CM | POA: Diagnosis not present

## 2017-12-24 DIAGNOSIS — M47817 Spondylosis without myelopathy or radiculopathy, lumbosacral region: Secondary | ICD-10-CM | POA: Diagnosis not present

## 2018-01-10 ENCOUNTER — Other Ambulatory Visit (HOSPITAL_COMMUNITY): Payer: Self-pay | Admitting: Cardiology

## 2018-02-24 DIAGNOSIS — Z961 Presence of intraocular lens: Secondary | ICD-10-CM | POA: Diagnosis not present

## 2018-02-24 DIAGNOSIS — H43813 Vitreous degeneration, bilateral: Secondary | ICD-10-CM | POA: Diagnosis not present

## 2018-02-24 DIAGNOSIS — H04123 Dry eye syndrome of bilateral lacrimal glands: Secondary | ICD-10-CM | POA: Diagnosis not present

## 2018-02-24 DIAGNOSIS — H52203 Unspecified astigmatism, bilateral: Secondary | ICD-10-CM | POA: Diagnosis not present

## 2018-04-21 DIAGNOSIS — K429 Umbilical hernia without obstruction or gangrene: Secondary | ICD-10-CM | POA: Diagnosis not present

## 2018-06-12 ENCOUNTER — Other Ambulatory Visit (HOSPITAL_COMMUNITY): Payer: Self-pay | Admitting: Cardiology

## 2018-06-19 DIAGNOSIS — Z23 Encounter for immunization: Secondary | ICD-10-CM | POA: Diagnosis not present

## 2018-07-21 ENCOUNTER — Ambulatory Visit: Payer: Self-pay | Admitting: Surgery

## 2018-07-21 DIAGNOSIS — K429 Umbilical hernia without obstruction or gangrene: Secondary | ICD-10-CM | POA: Diagnosis not present

## 2018-07-21 NOTE — H&P (Signed)
Danny [name] Documented: 07/21/2018 10:07 AM Location: [facility] Patient #: [id number] DOB: August 31, 1936 Married / Language: Danny [name] / Race: White Male  History of Present Illness Marcello Moores A. Gailene Youkhana MD; 07/21/2018 12:43 PM) Patient words: Patient returns for recheck of his umbilical hernia. It is no worse than before. He desires repair and it was discussed with him today. It pops in and pops out. He is having no further discomfort from the hernia at this point time with his discomfort being mild.  The patient is a 82 year old male.   Allergies Sharyn Lull R. Brooks, CMA; 07/21/2018 10:08 AM) No Known Drug Allergies [04/21/2018]: Allergies Reconciled  Medication History Sharyn Lull R. Brooks, CMA; 07/21/2018 10:08 AM) Aspirin (81MG  Tablet, Oral) Active. Restasis (0.05% Emulsion, Ophthalmic) Active. Finasteride (5MG  Tablet, Oral) Active. Rosuvastatin Calcium (5MG  Tablet, Oral) Active. Tamsulosin HCl (0.4MG  Capsule, Oral) Active. Co Q 10 (Oral) Specific strength unknown - Active. Magnesium Oxide (400MG  Capsule, Oral) Active. Zantac 150 Maximum Strength (150MG  Tablet, Oral) Active. Lyrica (100MG  Capsule, Oral) Active. Zolpidem Tartrate (10MG  Tablet, Oral) Active. TraMADol HCl (50MG  Tablet, Oral) Active. Tylenol (500MG  Capsule, Oral) Active. TraZODone HCl (50MG  Tablet, Oral) Active. Probiotic (Oral) Active. PreserVision AREDS (Oral) Active. Saw Palmetto (160MG  Capsule, Oral) Active. Citracal +D3 (Oral) Specific strength unknown - Active. Vitamin D3 (1000UNIT Tablet, Oral) Active. Centrum Silver (Oral) Active. Metamucil (Oral) Specific strength unknown - Active. Docusate Sodium (100MG  Capsule, Oral) Active. Medications Reconciled    Vitals Sharyn Lull R. Brooks CMA; 07/21/2018 10:08 AM) 07/21/2018 10:07 AM Weight: 160.25 lb Height: 70in Body Surface Area: 1.9 m Body Mass Index: 22.99 kg/m  Temp.: 97.59F(Temporal)  Pulse: 72 (Regular)   P.OX: 97% (Room air) BP: 116/80 (Sitting, Right Arm, Standard)      Physical Exam (Aricka Goldberger A. Jaina Morin MD; 07/21/2018 12:43 PM)  Head and Neck Head-normocephalic, atraumatic with no lesions or palpable masses.  Chest and Lung Exam Chest and lung exam reveals -quiet, even and easy respiratory effort with no use of accessory muscles and on auscultation, normal breath sounds, no adventitious sounds and normal vocal resonance. Inspection Chest Wall - Normal. Back - normal.  Cardiovascular Cardiovascular examination reveals -on palpation PMI is normal in location and amplitude, no palpable S3 or S4. Normal cardiac borders., normal heart sounds, regular rate and rhythm with no murmurs, carotid auscultation reveals no bruits and normal pedal pulses bilaterally.  Abdomen Note: Small reducible umbilical hernia.  Neurologic Neurologic evaluation reveals -alert and oriented x 3 with no impairment of recent or remote memory. Mental Status-Normal.  Musculoskeletal Normal Exam - Left-Upper Extremity Strength Normal and Lower Extremity Strength Normal. Normal Exam - Right-Upper Extremity Strength Normal, Lower Extremity Weakness.    Assessment & Plan (Maikayla Beggs A. Saleemah Mollenhauer MD; 62/01/6388 37:34 PM)  UMBILICAL HERNIA WITHOUT OBSTRUCTION AND WITHOUT GANGRENE (K42.9) Impression: Discuss repair of his umbilical hernia and the potential complications pros and cons and alternative treatments. He agrees proceed with repair with mesh.  Current Plans You are being scheduled for surgery- Our schedulers will call you.  You should hear from our office's scheduling department within 5 working days about the location, date, and time of surgery. We try to make accommodations for patient's preferences in scheduling surgery, but sometimes the OR schedule or the surgeon's schedule prevents [place] from making those accommodations.  If you have not heard from our office [phone]) in 5 working  days, call the office and ask for your surgeon's nurse.  If you have other questions about your diagnosis, plan, or surgery, call  the office and ask for your surgeon's nurse.  Pt Education - Pamphlet Given - Hernia Surgery: discussed with patient and provided information. Pt Education - CCS Mesh education: discussed with patient and provided information. The anatomy & physiology of the abdominal wall was discussed. The pathophysiology of hernias was discussed. Natural history risks without surgery including progeressive enlargement, pain, incarceration, & strangulation was discussed. Contributors to complications such as smoking, obesity, diabetes, prior surgery, etc were discussed.  I feel the risks of no intervention will lead to serious problems that outweigh the operative risks; therefore, I recommended surgery to reduce and repair the hernia. I explained laparoscopic techniques with possible need for an open approach. I noted the probable use of mesh to patch and/or buttress the hernia repair  Risks such as bleeding, infection, abscess, need for further treatment, heart attack, death, and other risks were discussed. I noted a good likelihood this will help address the problem. Goals of post-operative recovery were discussed as well. Possibility that this will not correct all symptoms was explained. I stressed the importance of low-impact activity, aggressive pain control, avoiding constipation, & not pushing through pain to minimize risk of post-operative chronic pain or injury. Possibility of reherniation especially with smoking, obesity, diabetes, immunosuppression, and other health conditions was discussed. We will work to minimize complications.  An educational handout further explaining the pathology & treatment options was given as well. Questions were answered. The patient expresses understanding & wishes to proceed with surgery.  You are being scheduled for surgery- Our  schedulers will call you.  You should hear from our office's scheduling department within 5 working days about the location, date, and time of surgery. We try to make accommodations for patient's preferences in scheduling surgery, but sometimes the OR schedule or the surgeon's schedule prevents [place] from making those accommodations.  If you have not heard from our office [phone]) in 5 working days, call the office and ask for your surgeon's nurse.  If you have other questions about your diagnosis, plan, or surgery, call the office and ask for your surgeon's nurse.  Pt Education - Pamphlet Given - Hernia Surgery: discussed with patient and provided information. Pt Education - CCS Mesh education: discussed with patient and provided information. The anatomy & physiology of the abdominal wall was discussed. The pathophysiology of hernias was discussed. Natural history risks without surgery including progeressive enlargement, pain, incarceration, & strangulation was discussed. Contributors to complications such as smoking, obesity, diabetes, prior surgery, etc were discussed.  I feel the risks of no intervention will lead to serious problems that outweigh the operative risks; therefore, I recommended surgery to reduce and repair the hernia. I explained laparoscopic techniques with possible need for an open approach. I noted the probable use of mesh to patch and/or buttress the hernia repair  Risks such as bleeding, infection, abscess, need for further treatment, heart attack, death, and other risks were discussed. I noted a good likelihood this will help address the problem. Goals of post-operative recovery were discussed as well. Possibility that this will not correct all symptoms was explained. I stressed the importance of low-impact activity, aggressive pain control, avoiding constipation, & not pushing through pain to minimize risk of post-operative chronic pain or injury. Possibility  of reherniation especially with smoking, obesity, diabetes, immunosuppression, and other health conditions was discussed. We will work to minimize complications.  An educational handout further explaining the pathology & treatment options was given as well. Questions were answered. The patient expresses understanding &  wishes to proceed with surgery.

## 2018-07-31 ENCOUNTER — Ambulatory Visit (HOSPITAL_COMMUNITY)
Admission: RE | Admit: 2018-07-31 | Discharge: 2018-07-31 | Disposition: A | Discharge status: Home / Self Care | Payer: Medicare Other | Source: Ambulatory Visit | Attending: Cardiology | Admitting: Cardiology

## 2018-07-31 VITALS — BP 126/82 | HR 65 | Wt 161.4 lb

## 2018-07-31 DIAGNOSIS — E785 Hyperlipidemia, unspecified: Secondary | ICD-10-CM | POA: Insufficient documentation

## 2018-07-31 DIAGNOSIS — K219 Gastro-esophageal reflux disease without esophagitis: Secondary | ICD-10-CM | POA: Diagnosis not present

## 2018-07-31 DIAGNOSIS — I350 Nonrheumatic aortic (valve) stenosis: Secondary | ICD-10-CM | POA: Diagnosis not present

## 2018-07-31 DIAGNOSIS — K429 Umbilical hernia without obstruction or gangrene: Secondary | ICD-10-CM | POA: Insufficient documentation

## 2018-07-31 DIAGNOSIS — I25119 Atherosclerotic heart disease of native coronary artery with unspecified angina pectoris: Secondary | ICD-10-CM

## 2018-07-31 DIAGNOSIS — Z7982 Long term (current) use of aspirin: Secondary | ICD-10-CM | POA: Insufficient documentation

## 2018-07-31 DIAGNOSIS — I2510 Atherosclerotic heart disease of native coronary artery without angina pectoris: Secondary | ICD-10-CM | POA: Insufficient documentation

## 2018-07-31 DIAGNOSIS — Z79899 Other long term (current) drug therapy: Secondary | ICD-10-CM | POA: Insufficient documentation

## 2018-07-31 DIAGNOSIS — I712 Thoracic aortic aneurysm, without rupture: Secondary | ICD-10-CM | POA: Diagnosis not present

## 2018-07-31 DIAGNOSIS — Z01818 Encounter for other preprocedural examination: Secondary | ICD-10-CM | POA: Insufficient documentation

## 2018-07-31 LAB — LIPID PANEL
CHOL/HDL RATIO: 2.5 ratio
CHOLESTEROL: 131 mg/dL (ref 0–200)
HDL: 53 mg/dL (ref >40)
LDL Cholesterol: 57 mg/dL (ref 0–99)
TRIGLYCERIDES: 107 mg/dL (ref <150)
VLDL: 21 mg/dL (ref 0–40)

## 2018-07-31 LAB — BASIC METABOLIC PANEL
ANION GAP: 7 (ref 5–15)
BUN: 15 mg/dL (ref 8–23)
CO2: 25 mmol/L (ref 22–32)
Calcium: 9.4 mg/dL (ref 8.9–10.3)
Chloride: 105 mmol/L (ref 98–111)
Creatinine, Ser: 0.82 mg/dL (ref 0.61–1.24)
GFR calc Af Amer: >60 mL/min (ref >60)
Glucose, Bld: 106 mg/dL — ABNORMAL HIGH (ref 70–99)
POTASSIUM: 4 mmol/L (ref 3.5–5.1)
Sodium: 137 mmol/L (ref 135–145)

## 2018-07-31 LAB — CBC
HCT: 46.1 % (ref 39.0–52.0)
HEMOGLOBIN: 15 g/dL (ref 13.0–17.0)
MCH: 30.3 pg (ref 26.0–34.0)
MCHC: 32.5 g/dL (ref 30.0–36.0)
MCV: 93.1 fL (ref 80.0–100.0)
Platelets: 215 10*3/uL (ref 150–400)
RBC: 4.95 MIL/uL (ref 4.22–5.81)
RDW: 13.1 % (ref 11.5–15.5)
WBC: 7.9 10*3/uL (ref 4.0–10.5)
nRBC: 0 % (ref 0.0–0.2)

## 2018-07-31 NOTE — Patient Instructions (Signed)
Routine lab work today. Will notify you of abnormal results  Your provider requests you have an echocardiogram.  Follow up in 1 year.  Please call our office at [phone] in October2020 to schedule your November 2020 appointment.

## 2018-07-31 NOTE — Progress Notes (Signed)
Patient ID: Danny [name], male   DOB: July 25, 1936, 82 y.o.   MRN: [id number] PCP: Dr. Felipa Eth Cardiology: Dr. Aundra Dubin  82 y.o. yo with history of nonobstructive CAD presents for followup of CAD and aortic stenosis.  He last had an ETT-Cardiolite in 6/16 that was normal.  Echo in 9/17 showed EF 55-60%, mild aortic stenosis, 4.0 cm ascending aorta.  MRA chest in 12/18 showed tortuous thoracic aorta without aneurysm.   He has been doing well recently.  No exertional dyspnea or chest pain.  No lightheadedness.  BP controlled. No palpitations.  He is planning to have surgery on an umbilical hernia soon.   ECG (personally reviewed): NSR, 1st degree AVB  Labs (8/14): K 3.9, creatinine 0.89 Labs (12/14): LDL 62, HDL 48   Labs (6/16): LDL 56 Labs (1/17): creatinine 0.88, HCT 43.9 Labs (8/17): K 4.3, creatinine 0.88, LFTs normal Labs (9/17): CK normal, Mg normal Labs (11/17): LDL 32, HDL 52 Labs (11/18): LDL 50  PMH: 1. Rotator cuff surgery 2. Diverticulosis 3. Chronic persistent hepatitis 4. Impaired fasting glucose 5. 6th nerve palsy in 2001 6. Allergic rhinitis 7. Low back pain s/p several operations.  Most recently had microdiscectomy in 1/17.  8. CAD: Patient developed exertional dyspnea.  ETT-myoview (8/12) with 11 METS, normal LV EF, normal perfusion at rest and stress but elevated TID ratio.  Coronary CT angiogram in 9/12 showed equivocal significant disease in the CFX.  Cath in 10/12 showed EF 55-60%, 30% mid RCA, 40% distal RCA, 40% proximal LAD.  ETT-Cardiolite in 10/14 showed EF 62%, probably normal study with diaphragmatic attenuation.   Echo (4/15) with EF 55-60%, mild MR.  - Cardiolite (6/16) with no ischemia/infarction.  - Echo (9/17) with EF 55-60%, mild aortic stenosis, 4.0 cm ascending aorta.  9. GERD 10. Hyperlipidemia: Myalgias with atorvastatin.  11. Aortic stenosis: Mild on 9/17 echo.  12. Ascending aorta dilation: MRA chest (12/18) showed a tortuous ascending aorta  without aneurysmal dilation.   FH: Father with CVA at 27  SH: Married, never smoked, rare ETOH.   ROS: All systems reviewed and negative except as per HPI.   Current Outpatient Medications  Medication Sig Dispense Refill  . aspirin 81 MG tablet Take 81 mg by mouth daily.      . Calcium Citrate-Vitamin D (CITRACAL + D PO) Take 1 tablet by mouth daily.    . Coenzyme Q10 (CO Q 10 PO) Take 400 mg by mouth daily.    . finasteride (PROSCAR) 5 MG tablet Take 5 mg by mouth daily.      . Magnesium Oxide 400 MG CAPS Take 1 capsule (400 mg total) by mouth daily. 30 capsule 11  . Multiple Vitamin (MULTIVITAMIN) tablet Take 1 tablet by mouth daily.      . pregabalin (LYRICA) 100 MG capsule Take 100-200 mg by mouth at bedtime.     . RESTASIS 0.05 % ophthalmic emulsion Place 1 drop into both eyes 2 (two) times daily.     . rosuvastatin (CRESTOR) 5 MG tablet TAKE 1 TABLET ONCE DAILY. 90 tablet 0  . Saw Palmetto, Serenoa repens, (SAW PALMETTO PO) Take 1 tablet by mouth 2 (two) times daily.    . tamsulosin (FLOMAX) 0.4 MG CAPS capsule Take 0.4 mg by mouth.    . traMADol (ULTRAM) 50 MG tablet Take 50 mg 2 (two) times daily by mouth.    . Turmeric 500 MG CAPS Take 1,000 mg by mouth.    . Wheat Dextrin (BENEFIBER PO)  Take 1 Dose by mouth daily as needed.    . nitroGLYCERIN (NITROSTAT) 0.4 MG SL tablet Place 1 tablet (0.4 mg total) under the tongue every 5 (five) minutes as needed for chest pain. (Patient not taking: Reported on 07/31/2018) 25 tablet 12   No current facility-administered medications for this encounter.     BP 126/82   Pulse 65   Wt 73.2 kg (161 lb 6.4 oz)   SpO2 96%   BMI 23.83 kg/m  General: NAD Neck: No JVD, no thyromegaly or thyroid nodule.  Lungs: Clear to auscultation bilaterally with normal respiratory effort. CV: Nondisplaced PMI.  Heart regular S1/S2, no O1/R7, 2/6 systolic murmur RUSB with clear S2.  No peripheral edema.  No carotid bruit.  Normal pedal pulses.  Abdomen:  Soft, nontender, no hepatosplenomegaly, no distention.  Skin: Intact without lesions or rashes.  Neurologic: Alert and oriented x 3.  Psych: Normal affect. Extremities: No clubbing or cyanosis.  HEENT: Normal.   Assessment/Plan:  1. CAD: Nonobstructive on prior cath.  Low risk Cardiolite in 6/16. No chest pain. - Continue ASA 81.  2. Hyperlipidemia: Goal LDL < 70 with known CAD.  Myalgias with atorvastatin, now tolerating Crestor.   - Check lipids today.  3. Aortic stenosis: Mild on 9/17 echo.  I will get a repeat echo to follow aortic stenosis.    4. Ascending aortic aneurysm: Mild, 4.0 cm on 9/17 echo. MRA chest in 12/18 showed tortuous aorta without frank aneurysmal dilation.   5. Presurgical evaluation: Patient needs umbilical hernia repair. He has no significant cardiac symptoms, good exercise tolerance.  He is of reasonable risk to undergo surgery.   Followup 1 year in Heart/Vascular clinic   Loralie Champagne 07/31/2028

## 2018-08-15 ENCOUNTER — Ambulatory Visit (HOSPITAL_COMMUNITY)
Admission: RE | Admit: 2018-08-15 | Discharge: 2018-08-15 | Disposition: A | Discharge status: Home / Self Care | Payer: Medicare Other | Source: Ambulatory Visit | Attending: Cardiology | Admitting: Cardiology

## 2018-08-15 DIAGNOSIS — I25119 Atherosclerotic heart disease of native coronary artery with unspecified angina pectoris: Secondary | ICD-10-CM | POA: Diagnosis not present

## 2018-08-15 DIAGNOSIS — I352 Nonrheumatic aortic (valve) stenosis with insufficiency: Secondary | ICD-10-CM | POA: Diagnosis not present

## 2018-08-15 DIAGNOSIS — R0600 Dyspnea, unspecified: Secondary | ICD-10-CM | POA: Insufficient documentation

## 2018-08-15 NOTE — Progress Notes (Signed)
  Echocardiogram 2D Echocardiogram has been performed.  Danny [name] 08/15/2018, 3:47 PM

## 2018-08-18 ENCOUNTER — Telehealth (HOSPITAL_COMMUNITY): Payer: Self-pay

## 2018-08-18 NOTE — Telephone Encounter (Signed)
Pt called with ECHO results  

## 2018-08-26 ENCOUNTER — Encounter (HOSPITAL_BASED_OUTPATIENT_CLINIC_OR_DEPARTMENT_OTHER): Payer: Self-pay

## 2018-08-26 ENCOUNTER — Ambulatory Visit (HOSPITAL_BASED_OUTPATIENT_CLINIC_OR_DEPARTMENT_OTHER): Admit: 2018-08-26 | Payer: Medicare Other | Admitting: Surgery

## 2018-08-26 SURGERY — REPAIR, HERNIA, UMBILICAL, ADULT
Anesthesia: General

## 2018-08-27 DIAGNOSIS — K429 Umbilical hernia without obstruction or gangrene: Secondary | ICD-10-CM | POA: Diagnosis not present

## 2018-09-11 ENCOUNTER — Other Ambulatory Visit (HOSPITAL_COMMUNITY): Payer: Self-pay | Admitting: Cardiology

## 2018-09-23 ENCOUNTER — Other Ambulatory Visit (HOSPITAL_COMMUNITY): Payer: Self-pay | Admitting: Cardiology

## 2018-09-30 DIAGNOSIS — Z974 Presence of external hearing-aid: Secondary | ICD-10-CM | POA: Diagnosis not present

## 2018-09-30 DIAGNOSIS — H903 Sensorineural hearing loss, bilateral: Secondary | ICD-10-CM | POA: Diagnosis not present

## 2018-10-28 DIAGNOSIS — Z79899 Other long term (current) drug therapy: Secondary | ICD-10-CM | POA: Diagnosis not present

## 2018-10-28 DIAGNOSIS — N401 Enlarged prostate with lower urinary tract symptoms: Secondary | ICD-10-CM | POA: Diagnosis not present

## 2018-10-28 DIAGNOSIS — E7800 Pure hypercholesterolemia, unspecified: Secondary | ICD-10-CM | POA: Diagnosis not present

## 2018-10-28 DIAGNOSIS — Z0000 Encounter for general adult medical examination without abnormal findings: Secondary | ICD-10-CM | POA: Diagnosis not present

## 2018-10-28 DIAGNOSIS — K649 Unspecified hemorrhoids: Secondary | ICD-10-CM | POA: Diagnosis not present

## 2018-10-28 DIAGNOSIS — Z1389 Encounter for screening for other disorder: Secondary | ICD-10-CM | POA: Diagnosis not present

## 2018-10-28 DIAGNOSIS — I350 Nonrheumatic aortic (valve) stenosis: Secondary | ICD-10-CM | POA: Diagnosis not present

## 2018-10-28 DIAGNOSIS — K219 Gastro-esophageal reflux disease without esophagitis: Secondary | ICD-10-CM | POA: Diagnosis not present

## 2018-10-28 DIAGNOSIS — N644 Mastodynia: Secondary | ICD-10-CM | POA: Diagnosis not present

## 2018-10-30 ENCOUNTER — Other Ambulatory Visit: Payer: Self-pay | Admitting: Surgery

## 2018-10-30 DIAGNOSIS — N644 Mastodynia: Secondary | ICD-10-CM

## 2018-11-07 ENCOUNTER — Other Ambulatory Visit: Payer: Self-pay | Admitting: Surgery

## 2018-11-07 ENCOUNTER — Other Ambulatory Visit: Payer: Self-pay

## 2018-11-07 DIAGNOSIS — N644 Mastodynia: Secondary | ICD-10-CM

## 2018-11-11 ENCOUNTER — Ambulatory Visit
Admission: RE | Admit: 2018-11-11 | Discharge: 2018-11-11 | Disposition: A | Discharge status: Home / Self Care | Payer: Medicare Other | Source: Ambulatory Visit | Attending: Surgery | Admitting: Surgery

## 2018-11-11 ENCOUNTER — Ambulatory Visit: Payer: Medicare Other

## 2018-11-11 DIAGNOSIS — N644 Mastodynia: Secondary | ICD-10-CM

## 2018-11-11 DIAGNOSIS — R928 Other abnormal and inconclusive findings on diagnostic imaging of breast: Secondary | ICD-10-CM | POA: Diagnosis not present

## 2018-11-17 DIAGNOSIS — J0110 Acute frontal sinusitis, unspecified: Secondary | ICD-10-CM | POA: Diagnosis not present

## 2018-11-26 DIAGNOSIS — D229 Melanocytic nevi, unspecified: Secondary | ICD-10-CM | POA: Diagnosis not present

## 2018-11-26 DIAGNOSIS — Z85820 Personal history of malignant melanoma of skin: Secondary | ICD-10-CM | POA: Diagnosis not present

## 2018-11-26 DIAGNOSIS — L570 Actinic keratosis: Secondary | ICD-10-CM | POA: Diagnosis not present

## 2018-11-26 DIAGNOSIS — L819 Disorder of pigmentation, unspecified: Secondary | ICD-10-CM | POA: Diagnosis not present

## 2018-11-26 DIAGNOSIS — L821 Other seborrheic keratosis: Secondary | ICD-10-CM | POA: Diagnosis not present

## 2018-11-26 DIAGNOSIS — L814 Other melanin hyperpigmentation: Secondary | ICD-10-CM | POA: Diagnosis not present

## 2018-11-26 DIAGNOSIS — D1801 Hemangioma of skin and subcutaneous tissue: Secondary | ICD-10-CM | POA: Diagnosis not present

## 2018-12-11 ENCOUNTER — Other Ambulatory Visit (HOSPITAL_COMMUNITY): Payer: Self-pay | Admitting: Cardiology

## 2019-03-06 DIAGNOSIS — H43813 Vitreous degeneration, bilateral: Secondary | ICD-10-CM | POA: Diagnosis not present

## 2019-03-06 DIAGNOSIS — Z961 Presence of intraocular lens: Secondary | ICD-10-CM | POA: Diagnosis not present

## 2019-03-06 DIAGNOSIS — H52203 Unspecified astigmatism, bilateral: Secondary | ICD-10-CM | POA: Diagnosis not present

## 2019-03-06 DIAGNOSIS — H04123 Dry eye syndrome of bilateral lacrimal glands: Secondary | ICD-10-CM | POA: Diagnosis not present

## 2019-03-19 DIAGNOSIS — M47817 Spondylosis without myelopathy or radiculopathy, lumbosacral region: Secondary | ICD-10-CM | POA: Diagnosis not present

## 2019-04-14 ENCOUNTER — Other Ambulatory Visit (HOSPITAL_COMMUNITY): Payer: Self-pay | Admitting: Cardiology

## 2019-04-21 DIAGNOSIS — R030 Elevated blood-pressure reading, without diagnosis of hypertension: Secondary | ICD-10-CM | POA: Diagnosis not present

## 2019-04-21 DIAGNOSIS — M48061 Spinal stenosis, lumbar region without neurogenic claudication: Secondary | ICD-10-CM | POA: Diagnosis not present

## 2019-06-09 DIAGNOSIS — M48061 Spinal stenosis, lumbar region without neurogenic claudication: Secondary | ICD-10-CM | POA: Diagnosis not present

## 2019-06-18 DIAGNOSIS — Z23 Encounter for immunization: Secondary | ICD-10-CM | POA: Diagnosis not present

## 2019-06-30 DIAGNOSIS — M48061 Spinal stenosis, lumbar region without neurogenic claudication: Secondary | ICD-10-CM | POA: Diagnosis not present

## 2019-06-30 DIAGNOSIS — R030 Elevated blood-pressure reading, without diagnosis of hypertension: Secondary | ICD-10-CM | POA: Diagnosis not present

## 2019-06-30 DIAGNOSIS — M5416 Radiculopathy, lumbar region: Secondary | ICD-10-CM | POA: Diagnosis not present

## 2019-07-07 DIAGNOSIS — M545 Low back pain: Secondary | ICD-10-CM | POA: Diagnosis not present

## 2019-08-07 ENCOUNTER — Ambulatory Visit (HOSPITAL_COMMUNITY)
Admission: RE | Admit: 2019-08-07 | Discharge: 2019-08-07 | Disposition: A | Discharge status: Home / Self Care | Payer: Medicare Other | Source: Ambulatory Visit | Attending: Cardiology | Admitting: Cardiology

## 2019-08-07 ENCOUNTER — Other Ambulatory Visit: Payer: Self-pay

## 2019-08-07 ENCOUNTER — Encounter (HOSPITAL_COMMUNITY): Payer: Self-pay | Admitting: Cardiology

## 2019-08-07 VITALS — BP 118/70 | HR 57 | Wt 165.6 lb

## 2019-08-07 DIAGNOSIS — K219 Gastro-esophageal reflux disease without esophagitis: Secondary | ICD-10-CM | POA: Insufficient documentation

## 2019-08-07 DIAGNOSIS — I350 Nonrheumatic aortic (valve) stenosis: Secondary | ICD-10-CM

## 2019-08-07 DIAGNOSIS — Z823 Family history of stroke: Secondary | ICD-10-CM | POA: Insufficient documentation

## 2019-08-07 DIAGNOSIS — Z79899 Other long term (current) drug therapy: Secondary | ICD-10-CM | POA: Insufficient documentation

## 2019-08-07 DIAGNOSIS — I712 Thoracic aortic aneurysm, without rupture: Secondary | ICD-10-CM | POA: Insufficient documentation

## 2019-08-07 DIAGNOSIS — I2510 Atherosclerotic heart disease of native coronary artery without angina pectoris: Secondary | ICD-10-CM | POA: Diagnosis not present

## 2019-08-07 DIAGNOSIS — Z7982 Long term (current) use of aspirin: Secondary | ICD-10-CM | POA: Diagnosis not present

## 2019-08-07 DIAGNOSIS — E785 Hyperlipidemia, unspecified: Secondary | ICD-10-CM | POA: Diagnosis not present

## 2019-08-07 DIAGNOSIS — I25119 Atherosclerotic heart disease of native coronary artery with unspecified angina pectoris: Secondary | ICD-10-CM

## 2019-08-07 LAB — LIPID PANEL
Cholesterol: 126 mg/dL (ref 0–200)
HDL: 55 mg/dL (ref >40)
LDL Cholesterol: 62 mg/dL (ref 0–99)
Total CHOL/HDL Ratio: 2.3 RATIO
Triglycerides: 44 mg/dL (ref <150)
VLDL: 9 mg/dL (ref 0–40)

## 2019-08-07 LAB — BASIC METABOLIC PANEL
Anion gap: 10 (ref 5–15)
BUN: 15 mg/dL (ref 8–23)
CO2: 25 mmol/L (ref 22–32)
Calcium: 9.6 mg/dL (ref 8.9–10.3)
Chloride: 104 mmol/L (ref 98–111)
Creatinine, Ser: 0.77 mg/dL (ref 0.61–1.24)
GFR calc Af Amer: >60 mL/min (ref >60)
GFR calc non Af Amer: >60 mL/min (ref >60)
Glucose, Bld: 97 mg/dL (ref 70–99)
Potassium: 4.1 mmol/L (ref 3.5–5.1)
Sodium: 139 mmol/L (ref 135–145)

## 2019-08-07 NOTE — Patient Instructions (Signed)
Routine lab work today. We will call you if labs are abnormal.  Follow up in 1 year. Please call [phone] in October 2021 to schedule your November 2021 appointment.

## 2019-08-09 NOTE — Progress Notes (Signed)
Patient ID: Danny [name], male   DOB: 02/14/1936, 83 y.o.   MRN: [id number] PCP: Dr. Felipa Eth Cardiology: Dr. Aundra Dubin  83 y.o. yo with history of nonobstructive CAD presents for followup of CAD and aortic stenosis.  He last had an ETT-Cardiolite in 6/16 that was normal.  Echo in 9/17 showed EF 55-60%, mild aortic stenosis, 4.0 cm ascending aorta.  MRA chest in 12/18 showed tortuous thoracic aorta without aneurysm. Echo in 11/19 showed EF 60-65%, mild AS/mild AI.   He has been doing well recently.  Mild dyspnea with heavier activity like raking leaves or walking up a hill. He plays golf 3 days a week and is able to walk 18 holes.  No chest pain.  No dyspnea walking on flat ground.    ECG (personally reviewed): NSR, 1st degree AVB  Labs (8/14): K 3.9, creatinine 0.89 Labs (12/14): LDL 62, HDL 48   Labs (6/16): LDL 56 Labs (1/17): creatinine 0.88, HCT 43.9 Labs (8/17): K 4.3, creatinine 0.88, LFTs normal Labs (9/17): CK normal, Mg normal Labs (11/17): LDL 32, HDL 52 Labs (11/18): LDL 50 Labs (11/19): LDL 57, HDL 53, K 4, creatinine 0.82  PMH: 1. Rotator cuff surgery 2. Diverticulosis 3. Chronic persistent hepatitis 4. Impaired fasting glucose 5. 6th nerve palsy in 2001 6. Allergic rhinitis 7. Low back pain s/p several operations.  Most recently had microdiscectomy in 1/17.  8. CAD: Patient developed exertional dyspnea.  ETT-myoview (8/12) with 11 METS, normal LV EF, normal perfusion at rest and stress but elevated TID ratio.  Coronary CT angiogram in 9/12 showed equivocal significant disease in the CFX.  Cath in 10/12 showed EF 55-60%, 30% mid RCA, 40% distal RCA, 40% proximal LAD.  ETT-Cardiolite in 10/14 showed EF 62%, probably normal study with diaphragmatic attenuation.   Echo (4/15) with EF 55-60%, mild MR.  - Cardiolite (6/16) with no ischemia/infarction.  - Echo (9/17) with EF 55-60%, mild aortic stenosis, 4.0 cm ascending aorta.  - Echo (11/19): EF 60-65%, mild AS mean gradient  9 mmHg with AVA 1.53 cm^2, midl AI.  9. GERD 10. Hyperlipidemia: Myalgias with atorvastatin.  11. Aortic stenosis: Mild on 9/17 echo. Mild on 11/19 echo.  12. Ascending aorta dilation: MRA chest (12/18) showed a tortuous ascending aorta without aneurysmal dilation.   FH: Father with CVA at 66  SH: Married, never smoked, rare ETOH.   ROS: All systems reviewed and negative except as per HPI.   Current Outpatient Medications  Medication Sig Dispense Refill  . acetaminophen (TYLENOL) 500 MG tablet Take 500 mg by mouth every 6 (six) hours as needed.    [name] aspirin 81 MG tablet Take 81 mg by mouth daily.      . Calcium Citrate-Vitamin D (CITRACAL + D PO) Take 1 tablet by mouth daily.    . Coenzyme Q10 (CO Q 10 PO) Take 400 mg by mouth daily.    [name] docusate sodium (COLACE) 100 MG capsule Take 100 mg by mouth 2 (two) times daily.    . famotidine (PEPCID) 20 MG tablet Take 20 mg by mouth 2 (two) times daily.    . finasteride (PROSCAR) 5 MG tablet Take 5 mg by mouth daily.      . Magnesium Oxide 400 MG CAPS Take 1 capsule (400 mg total) by mouth daily. 30 capsule 11  . Multiple Vitamin (MULTIVITAMIN) tablet Take 1 tablet by mouth daily.      [name] NITROSTAT 0.4 MG SL tablet ONE TABLET UNDER TONGUE WHEN NEEDED  FOR CHEST PAIN. MAY REPEAT IN 5 MINUTES. 25 tablet 6  . pregabalin (LYRICA) 100 MG capsule Take 100-200 mg by mouth at bedtime.     . RESTASIS 0.05 % ophthalmic emulsion Place 1 drop into both eyes 2 (two) times daily.     . rosuvastatin (CRESTOR) 5 MG tablet TAKE 1 TABLET ONCE DAILY. 90 tablet 3  . Saw Palmetto, Serenoa repens, (SAW PALMETTO PO) Take 1 tablet by mouth 2 (two) times daily.    . tamsulosin (FLOMAX) 0.4 MG CAPS capsule Take 0.4 mg by mouth.    . traMADol (ULTRAM) 50 MG tablet Take 50 mg 2 (two) times daily by mouth.    . Turmeric 500 MG CAPS Take 1,000 mg by mouth.    . zolpidem (AMBIEN) 10 MG tablet Take 10 mg by mouth at bedtime as needed for sleep.    . Wheat Dextrin (BENEFIBER  PO) Take 1 Dose by mouth daily as needed.     No current facility-administered medications for this encounter.     BP 118/70   Pulse (!) 57   Wt 75.1 kg (165 lb 9.6 oz)   SpO2 98%   BMI 24.45 kg/m  General: NAD Neck: No JVD, no thyromegaly or thyroid nodule.  Lungs: Clear to auscultation bilaterally with normal respiratory effort. CV: Nondisplaced PMI.  Heart regular S1/S2, no S3/S4, 2/6 SEM RUSB (clear S2).  Trace ankle edema.  No carotid bruit.  Normal pedal pulses.  Abdomen: Soft, nontender, no hepatosplenomegaly, no distention.  Skin: Intact without lesions or rashes.  Neurologic: Alert and oriented x 3.  Psych: Normal affect. Extremities: No clubbing or cyanosis.  HEENT: Normal.    Assessment/Plan:  1. CAD: Nonobstructive on prior cath.  Low risk Cardiolite in 6/16. No chest pain. - Continue ASA 81.  2. Hyperlipidemia: Goal LDL < 70 with known CAD.  Myalgias with atorvastatin, now tolerating Crestor.   - Check lipids today.  3. Aortic stenosis: Mild on 11/19 echo.  4. Ascending aortic aneurysm: Mild, 4.0 cm on 9/17 echo. MRA chest in 12/18 showed tortuous aorta without frank aneurysmal dilation.    Followup 1 year in Heart/Vascular clinic   Loralie Champagne 08/09/2019

## 2019-08-10 DIAGNOSIS — M5416 Radiculopathy, lumbar region: Secondary | ICD-10-CM | POA: Diagnosis not present

## 2019-10-06 DIAGNOSIS — L304 Erythema intertrigo: Secondary | ICD-10-CM | POA: Diagnosis not present

## 2019-10-06 DIAGNOSIS — D485 Neoplasm of uncertain behavior of skin: Secondary | ICD-10-CM | POA: Diagnosis not present

## 2019-10-06 DIAGNOSIS — L821 Other seborrheic keratosis: Secondary | ICD-10-CM | POA: Diagnosis not present

## 2019-11-10 DIAGNOSIS — M5416 Radiculopathy, lumbar region: Secondary | ICD-10-CM | POA: Diagnosis not present

## 2019-11-11 DIAGNOSIS — I351 Nonrheumatic aortic (valve) insufficiency: Secondary | ICD-10-CM | POA: Diagnosis not present

## 2019-11-11 DIAGNOSIS — Z79899 Other long term (current) drug therapy: Secondary | ICD-10-CM | POA: Diagnosis not present

## 2019-11-11 DIAGNOSIS — Z0000 Encounter for general adult medical examination without abnormal findings: Secondary | ICD-10-CM | POA: Diagnosis not present

## 2019-11-11 DIAGNOSIS — K219 Gastro-esophageal reflux disease without esophagitis: Secondary | ICD-10-CM | POA: Diagnosis not present

## 2019-11-11 DIAGNOSIS — E7800 Pure hypercholesterolemia, unspecified: Secondary | ICD-10-CM | POA: Diagnosis not present

## 2019-11-11 DIAGNOSIS — Z1389 Encounter for screening for other disorder: Secondary | ICD-10-CM | POA: Diagnosis not present

## 2019-11-11 DIAGNOSIS — I350 Nonrheumatic aortic (valve) stenosis: Secondary | ICD-10-CM | POA: Diagnosis not present

## 2019-11-17 DIAGNOSIS — E7800 Pure hypercholesterolemia, unspecified: Secondary | ICD-10-CM | POA: Diagnosis not present

## 2019-11-17 DIAGNOSIS — Z79899 Other long term (current) drug therapy: Secondary | ICD-10-CM | POA: Diagnosis not present

## 2019-11-25 DIAGNOSIS — G5600 Carpal tunnel syndrome, unspecified upper limb: Secondary | ICD-10-CM | POA: Diagnosis not present

## 2019-11-25 DIAGNOSIS — G629 Polyneuropathy, unspecified: Secondary | ICD-10-CM | POA: Diagnosis not present

## 2019-11-30 DIAGNOSIS — G629 Polyneuropathy, unspecified: Secondary | ICD-10-CM | POA: Diagnosis not present

## 2019-12-02 DIAGNOSIS — L905 Scar conditions and fibrosis of skin: Secondary | ICD-10-CM | POA: Diagnosis not present

## 2019-12-02 DIAGNOSIS — D1801 Hemangioma of skin and subcutaneous tissue: Secondary | ICD-10-CM | POA: Diagnosis not present

## 2019-12-02 DIAGNOSIS — L821 Other seborrheic keratosis: Secondary | ICD-10-CM | POA: Diagnosis not present

## 2019-12-02 DIAGNOSIS — L819 Disorder of pigmentation, unspecified: Secondary | ICD-10-CM | POA: Diagnosis not present

## 2019-12-02 DIAGNOSIS — Z85820 Personal history of malignant melanoma of skin: Secondary | ICD-10-CM | POA: Diagnosis not present

## 2019-12-02 DIAGNOSIS — L814 Other melanin hyperpigmentation: Secondary | ICD-10-CM | POA: Diagnosis not present

## 2019-12-02 DIAGNOSIS — L718 Other rosacea: Secondary | ICD-10-CM | POA: Diagnosis not present

## 2019-12-02 DIAGNOSIS — D229 Melanocytic nevi, unspecified: Secondary | ICD-10-CM | POA: Diagnosis not present

## 2019-12-08 ENCOUNTER — Encounter: Payer: Self-pay | Admitting: Neurology

## 2019-12-09 DIAGNOSIS — Z6825 Body mass index (BMI) 25.0-25.9, adult: Secondary | ICD-10-CM | POA: Diagnosis not present

## 2019-12-09 DIAGNOSIS — M47817 Spondylosis without myelopathy or radiculopathy, lumbosacral region: Secondary | ICD-10-CM | POA: Diagnosis not present

## 2019-12-09 DIAGNOSIS — I10 Essential (primary) hypertension: Secondary | ICD-10-CM | POA: Diagnosis not present

## 2019-12-16 DIAGNOSIS — M79672 Pain in left foot: Secondary | ICD-10-CM | POA: Diagnosis not present

## 2019-12-16 DIAGNOSIS — M79671 Pain in right foot: Secondary | ICD-10-CM | POA: Diagnosis not present

## 2020-01-12 DIAGNOSIS — M47817 Spondylosis without myelopathy or radiculopathy, lumbosacral region: Secondary | ICD-10-CM | POA: Diagnosis not present

## 2020-01-27 DIAGNOSIS — Z981 Arthrodesis status: Secondary | ICD-10-CM | POA: Diagnosis not present

## 2020-01-27 DIAGNOSIS — M47817 Spondylosis without myelopathy or radiculopathy, lumbosacral region: Secondary | ICD-10-CM | POA: Diagnosis not present

## 2020-02-03 NOTE — Progress Notes (Signed)
NEUROLOGY CONSULTATION NOTE  JADIEL SCHMIEDER MRN: [id number] DOB: 01/03/1936  Referring provider: Lajean Manes, MD Primary care provider: Lajean Manes, MD  Reason for consult:  neuropathy  HISTORY OF PRESENT ILLNESS: Kyshaun Barnette. Furry is an 84 year old right-handed White male with chronic hepatitis, cervical disc disease, lumbar spine disease s/p surgery and CAD who presents for neuropathy.  History supplemented by prior neurologist's and referring provider's notes.  He is accompanied by his wife who provides collateral history.  In 2008, he began experiencing left upper extremity numbness and tingling following rotator cuff surgery.  It involves only the hand and is fairly constant.  No pain or weakness.  He is still able to perform daily activities such as chores around the house or playing golf.  EMG in 2014 reportedly negative.  Saw Dr. Rexene Alberts at [facility] in 2014.  NCV-EMG of left upper extremity was reportedly normal.  MRI of cervical spine showed mild degenerative disc disease but no significant central or foraminal stenosis.  MRI of brain showed mild age-related changes but overall unremarkable.  No specific diagnosis was determined.  It has been unchanged over the years.  Repeat NCV-EMG of left upper extremity on 11/25/2019 was normal.    For many years, he has had numbness and tingling in the feet as well as intermittent muscle cramps in the calves.  He has changed from Lipitor to Crestor with no improvement.  He typically treats with mustard and apple cider vinegar.  At night, he notes burning and stinging pain in the feet.  Bood work in 2014 showed negative ANA, negative RF, CRP 1.3, Hgb A1c 6.3, CK 116, elevated TSH 5.370, but subsequent TSH has been normal.  He has history of multiple back surgeries.  He had sinal fusion L3-4 in 2010, followed by spinal fusion L2-3 in 2011.  To evaluate low back pain with right leg numbness, he underwent MRI of lumbar spine on  03/26/2015 which showed interbody fusion at L2-3 and L3-4 without significant stenosis and disc degeneration and spurring causing right foraminal encroachment at L4-5 and L5-S1.   He underwent extra-foraminal microdiskectomy at L4-5 and L5-S1 in January 2017 but then developed left lower back pain into the hip.  Myelogram on 07/12/2016 showed solid fusion at L2-3 and L3-4 as well as multilevel subarticular and foraminal stenosis.  He has been taking Lyrica 338m at bedtime for several years.  NCV-EMG of the lower extremities from 11/30/2019 showed moderate to severe mixed axonal and demyelinating sensorimotor polyneuropathy but no evidence of lumbar radiculopathy, plexopathy or myopathy.    Imaging: 05/20/2013 MRI CERVICAL SPINE WO:  Mildly abnormal MRI scan of the cervical spine showing exaggerated forward lordotic curvature and minor disc degenerative changes without significant compression. 06/18/2013 MRI BRAIN W WO:  Abnormal MRI scan of the brain showing mild changes of chronic microvascular ischemia and generalized cerebral atrophy. 03/26/2015 MRI LUMBAR SPINE WO:  interbody fusion at L2-3 and L3-4 without significant stenosis.  Lumbar dextroscoliosis.  Disc degeneration and spurring causing right foraminal encroachment.  L4-5 and L5-S1, similar to 2011. 07/12/2016 CT MYELOGRAM LUMBAR SPINE:  1. Solid fusion at L2-3 and L3-4.  2. Progressive adjacent level disease at L1-2 with progressive moderate left and mild right subarticular stenosis.  3. Moderate left foraminal narrowing at L1-2.  4. Moderate left subarticular and mild left foraminal stenosis at L2-3 without significant interval change.  5. Moderate left subarticular and mild left foraminal narrowing at L3-4 is stable. This is predominantly  due to facet disease.  6. Mild subarticular and foraminal narrowing bilaterally at L4-5.  7. Mild subarticular narrowing at L5-S1 is worse on the right.  8. Moderate right and mild left foraminal narrowing at  L5-S1 is stable.  PAST MEDICAL HISTORY: Past Medical History:  Diagnosis Date   Allergic rhinitis    Arthritis    BPH (benign prostatic hypertrophy)    Dr. Alinda Money   Cervical disc disease    Coronary artery disease    Patient developed exertional dyspnea. ETT-myoview (8/12) with 11 METS, normal LV EF, normal perfusion at rest and stress but elevated TID ratio. Coronary CT angiogram in 9/12 showed equivocal significant disease in the CFX. Cath in 10/12 showed EF 55-60%, 30% mid RCA, 40% distal RCA, 40% proximal LAD. ETT-Myoview (10/14): Diaphragmatic attenuation, no ischemia, EF 62%, normal study   Detached vitreous humor    Diverticulosis    Diverticulosis    GERD (gastroesophageal reflux disease)    Heart murmur    Hemorrhoids    Impaired fasting glucose    Ingrown toenail    Lumbar radiculopathy    Numbness and tingling in left arm    Onychomycosis    Rotator cuff disorder    Sixth nerve palsy 2001    PAST SURGICAL HISTORY: Past Surgical History:  Procedure Laterality Date   abnormal stress test     APPENDECTOMY  1957   BACK SURGERY  2010,2011   lumbar fusion-   [place]  09/05/2011   Procedure: EXCISION LIPOMA;  Surgeon: Haywood Lasso, MD;  Location: [facility];  Service: General;  Laterality: Left;  removal of 4 cm lipoma of back   LUMBAR LAMINECTOMY/DECOMPRESSION MICRODISCECTOMY Right 10/05/2015   Procedure: Extraforaminal Microdiscectomy  - L4-L5 - L5-S1 - right;  Surgeon: Eustace Moore, MD;  Location: [facility];  Service: Neurosurgery;  Laterality: Right;  Extraforaminal Microdiscectomy  - L4-L5 - L5-S1 - right   melonoma Right    removed from right arm   ROTATOR CUFF REPAIR  07/17/2007   left   SPINE SURGERY  07/06/10 and 09/07/09   spinal fusion   TONSILLECTOMY AND ADENOIDECTOMY  1947    MEDICATIONS: Current  Outpatient Medications on File Prior to Visit  Medication Sig Dispense Refill   acetaminophen (TYLENOL) 500 MG tablet Take 500 mg by mouth every 6 (six) hours as needed.     aspirin 81 MG tablet Take 81 mg by mouth daily.       Calcium Citrate-Vitamin D (CITRACAL + D PO) Take 1 tablet by mouth daily.     Coenzyme Q10 (CO Q 10 PO) Take 400 mg by mouth daily.     docusate sodium (COLACE) 100 MG capsule Take 100 mg by mouth 2 (two) times daily.     famotidine (PEPCID) 20 MG tablet Take 20 mg by mouth 2 (two) times daily.     finasteride (PROSCAR) 5 MG tablet Take 5 mg by mouth daily.       Magnesium Oxide 400 MG CAPS Take 1 capsule (400 mg total) by mouth daily. 30 capsule 11   Multiple Vitamin (MULTIVITAMIN) tablet Take 1 tablet by mouth daily.       NITROSTAT 0.4 MG SL tablet ONE TABLET UNDER TONGUE WHEN NEEDED FOR CHEST PAIN. MAY REPEAT IN 5 MINUTES. 25 tablet 6   pregabalin (LYRICA) 100 MG capsule  Take 100-200 mg by mouth at bedtime.      RESTASIS 0.05 % ophthalmic emulsion Place 1 drop into both eyes 2 (two) times daily.      rosuvastatin (CRESTOR) 5 MG tablet TAKE 1 TABLET ONCE DAILY. 90 tablet 3   Saw Palmetto, Serenoa repens, (SAW PALMETTO PO) Take 1 tablet by mouth 2 (two) times daily.     tamsulosin (FLOMAX) 0.4 MG CAPS capsule Take 0.4 mg by mouth.     traMADol (ULTRAM) 50 MG tablet Take 50 mg 2 (two) times daily by mouth.     Turmeric 500 MG CAPS Take 1,000 mg by mouth.     Wheat Dextrin (BENEFIBER PO) Take 1 Dose by mouth daily as needed.     zolpidem (AMBIEN) 10 MG tablet Take 10 mg by mouth at bedtime as needed for sleep.     No current facility-administered medications on file prior to visit.    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[place] Use   Smoking status: Never Smoker   Smokeless tobacco: Never Used  Substance and Sexual Activity   Alcohol use: Yes    Comment: occasionally   Drug use: No   Sexual activity: Not on file  Other Topics Concern   Not on file  Social History Narrative   Not on file   Social Determinants of Health   Financial Resource Strain:    Difficulty of Paying Living Expenses:   Food Insecurity:    Worried About Charity fundraiser in the Last Year:    Arboriculturist in the Last Year:   Transportation Needs:    Film/video editor (Medical):    Lack of Transportation (Non-Medical):   Physical Activity:    Days of Exercise per Week:    Minutes of Exercise per Session:   Stress:    Feeling of Stress :   Social Connections:    Frequency of Communication with Friends and Family:    Frequency of Social Gatherings with Friends and Family:    Attends Religious Services:    Active Member of Clubs or Organizations:    Attends Music therapist:    Marital Status:   Intimate Partner Violence:    Fear of Current or Ex-Partner:    Emotionally Abused:    Physically Abused:    Sexually Abused:     PHYSICAL EXAM: Blood pressure 132/81, pulse 69, height 5' 9"  (1.753 m), weight 164 lb 12.8 oz (74.8 kg), SpO2 98 %. General: No acute distress.  Patient appears well-groomed.  Head:  Normocephalic/atraumatic Eyes:  fundi examined but not visualized Neck: supple, no paraspinal tenderness, full range of motion Back: No paraspinal tenderness Heart: regular rate and rhythm Lungs: Clear to auscultation bilaterally. Vascular: No carotid bruits. Neurological  Exam: Mental status: alert and oriented to person, place, and time, recent and remote  memory intact, fund of knowledge intact, attention and concentration intact, speech fluent and not dysarthric, language intact. Cranial nerves: CN I: not tested CN II: pupils equal, round and reactive to light, visual fields intact CN III, IV, VI:  full range of motion, no nystagmus, no ptosis CN V: facial sensation intact CN VII: upper and lower face symmetric CN VIII: hearing intact CN IX, X: gag intact, uvula midline CN XI: sternocleidomastoid and trapezius muscles intact CN XII: tongue midline Bulk & Tone: normal, no fasciculations. Motor:  5/5 throughout  Sensation:  Pinprick and vibration sensation reduced in feet. Deep Tendon Reflexes:  2+ throughout, toes downgoing.  Finger to nose testing:  Without dysmetria.  Heel to shin:  Without dysmetria.   Gait:  Normal station and stride.  Able to turn and tandem walk. Romberg negative.  IMPRESSION: 1.  Polyneuropathy 2.  History of multiple lumbar spine surgeries 3.  Muscle cramps in calves 4.  Left upper extremity numbness.  Likely residual from nerve damage during rotator cuff surgery.  No evidence of carpal tunnel syndrome, ulnar neuropathy, or cervical radiculopathy.  As symptoms are purely sensory, abnormalities might be missed on NCV-EMG.  PLAN: 1.  He will stop Lyrica and instead start gabapentin 343m twice daily for better management of nocturnal neuralgia as well as daytime muscle cramps. 2.  Check blood work for causes of neuropathy/cramps:  B12, folate, TSH, free T4, SPEP/IFE, Mg. 3.  Follow up in 4 months.  Thank you for allowing me to take part in the care of this patient.  AMetta Clines DO  CC:  Hal Stoneking

## 2020-02-04 ENCOUNTER — Other Ambulatory Visit (INDEPENDENT_AMBULATORY_CARE_PROVIDER_SITE_OTHER): Payer: Medicare Other

## 2020-02-04 ENCOUNTER — Other Ambulatory Visit: Payer: Self-pay

## 2020-02-04 ENCOUNTER — Encounter: Payer: Self-pay | Admitting: Neurology

## 2020-02-04 ENCOUNTER — Ambulatory Visit (INDEPENDENT_AMBULATORY_CARE_PROVIDER_SITE_OTHER): Payer: Medicare Other | Admitting: Neurology

## 2020-02-04 VITALS — BP 132/81 | HR 69 | Ht 69.0 in | Wt 164.8 lb

## 2020-02-04 DIAGNOSIS — R252 Cramp and spasm: Secondary | ICD-10-CM

## 2020-02-04 DIAGNOSIS — M048 Other autoinflammatory syndromes: Secondary | ICD-10-CM

## 2020-02-04 DIAGNOSIS — G6289 Other specified polyneuropathies: Secondary | ICD-10-CM | POA: Diagnosis not present

## 2020-02-04 DIAGNOSIS — R200 Anesthesia of skin: Secondary | ICD-10-CM | POA: Diagnosis not present

## 2020-02-04 DIAGNOSIS — Z9889 Other specified postprocedural states: Secondary | ICD-10-CM

## 2020-02-04 DIAGNOSIS — R202 Paresthesia of skin: Secondary | ICD-10-CM

## 2020-02-04 DIAGNOSIS — G603 Idiopathic progressive neuropathy: Secondary | ICD-10-CM

## 2020-02-04 LAB — B12 AND FOLATE PANEL
Folate: >23.7 ng/mL (ref >5.9)
Vitamin B-12: 459 pg/mL (ref 211–911)

## 2020-02-04 LAB — T4, FREE: Free T4: 0.83 ng/dL (ref 0.60–1.60)

## 2020-02-04 LAB — MAGNESIUM: Magnesium: 2.2 mg/dL (ref 1.5–2.5)

## 2020-02-04 LAB — TSH: TSH: 2.17 u[IU]/mL (ref 0.35–4.50)

## 2020-02-04 MED ORDER — GABAPENTIN 300 MG PO CAPS: 300.0000 mg | ORAL_CAPSULE | Freq: Two times a day (BID) | ORAL | 3 refills | Status: DC

## 2020-02-04 NOTE — Patient Instructions (Addendum)
1.  Stop Lyrica.  Start gabapentin 300mg  twice daily 2.  Check labs:  B12, folate, TSH, freeT4, SPEP/IFE. Your provider has requested that you have labwork completed today. Please go to [facility] ([address]) on the second floor of this building before leaving the office today. You do not need to check in. If you are not called within 15 minutes please check with the front desk.  3.  Follow up in 4 months.

## 2020-02-09 LAB — MULTIPLE MYELOMA PANEL, SERUM
Albumin SerPl Elph-Mcnc: 3.6 g/dL (ref 2.9–4.4)
Albumin/Glob SerPl: 1.5 (ref 0.7–1.7)
Alpha 1: 0.2 g/dL (ref 0.0–0.4)
Alpha2 Glob SerPl Elph-Mcnc: 0.7 g/dL (ref 0.4–1.0)
B-Globulin SerPl Elph-Mcnc: 0.9 g/dL (ref 0.7–1.3)
Gamma Glob SerPl Elph-Mcnc: 0.6 g/dL (ref 0.4–1.8)
Globulin, Total: 2.5 g/dL (ref 2.2–3.9)
IgA/Immunoglobulin A, Serum: 106 mg/dL (ref 61–437)
IgG (Immunoglobin G), Serum: 574 mg/dL — ABNORMAL LOW (ref 603–1613)
IgM (Immunoglobulin M), Srm: 57 mg/dL (ref 15–143)
Total Protein: 6.1 g/dL (ref 6.0–8.5)

## 2020-03-31 ENCOUNTER — Other Ambulatory Visit (HOSPITAL_COMMUNITY)
Admission: RE | Admit: 2020-03-31 | Discharge: 2020-03-31 | Disposition: A | Discharge status: Home / Self Care | Payer: Medicare Other | Source: Ambulatory Visit | Attending: Geriatric Medicine | Admitting: Geriatric Medicine

## 2020-03-31 DIAGNOSIS — M048 Other autoinflammatory syndromes: Secondary | ICD-10-CM | POA: Diagnosis not present

## 2020-03-31 DIAGNOSIS — G6289 Other specified polyneuropathies: Secondary | ICD-10-CM | POA: Insufficient documentation

## 2020-03-31 DIAGNOSIS — G603 Idiopathic progressive neuropathy: Secondary | ICD-10-CM | POA: Insufficient documentation

## 2020-03-31 LAB — CBC
HCT: 40.6 % (ref 39.0–52.0)
Hemoglobin: 13.7 g/dL (ref 13.0–17.0)
MCH: 31.2 pg (ref 26.0–34.0)
MCHC: 33.7 g/dL (ref 30.0–36.0)
MCV: 92.5 fL (ref 80.0–100.0)
Platelets: 214 10*3/uL (ref 150–400)
RBC: 4.39 MIL/uL (ref 4.22–5.81)
RDW: 13.5 % (ref 11.5–15.5)
WBC: 7.4 10*3/uL (ref 4.0–10.5)
nRBC: 0 % (ref 0.0–0.2)

## 2020-03-31 LAB — SEDIMENTATION RATE: Sed Rate: 9 mm/hr (ref 0–16)

## 2020-03-31 LAB — C-REACTIVE PROTEIN: CRP: 0.8 mg/dL (ref <1.0)

## 2020-04-05 DIAGNOSIS — H4922 Sixth [abducent] nerve palsy, left eye: Secondary | ICD-10-CM | POA: Diagnosis not present

## 2020-04-05 DIAGNOSIS — N401 Enlarged prostate with lower urinary tract symptoms: Secondary | ICD-10-CM | POA: Diagnosis not present

## 2020-04-05 DIAGNOSIS — J3489 Other specified disorders of nose and nasal sinuses: Secondary | ICD-10-CM | POA: Diagnosis not present

## 2020-04-05 DIAGNOSIS — R519 Headache, unspecified: Secondary | ICD-10-CM | POA: Diagnosis not present

## 2020-04-05 DIAGNOSIS — E7800 Pure hypercholesterolemia, unspecified: Secondary | ICD-10-CM | POA: Diagnosis not present

## 2020-04-06 ENCOUNTER — Telehealth: Payer: Self-pay | Admitting: Neurology

## 2020-04-06 NOTE — Telephone Encounter (Signed)
I have only seen patient once for neuropathy.  He will need to make an appointment but if it is more urgent, he needs to be seen by his PCP or go to the ED

## 2020-04-06 NOTE — Telephone Encounter (Signed)
Please advise 

## 2020-04-06 NOTE — Telephone Encounter (Signed)
Pt wife advised of Dr. Tomi Likens note.   Pt Wife wanted to know if the pt could be put on the wait list? She do not feel comfortable taking the pt to the hospital. Pt f/u appt is 05/2020 and she feel that is too far away.

## 2020-04-06 NOTE — Telephone Encounter (Signed)
Patient's wife came into office. Patient has had a bad headache for over a month. He was checked by Dr. Constance Holster for sinus infection, and performed a scan that determined there was no sinus infection. Dr. Constance Holster suggested they have Dr. Tomi Likens order a brain scan for patient to determine if anything else is going on. Patient's wife is requesting a phone call back.

## 2020-04-07 NOTE — Telephone Encounter (Signed)
Yes, any patient can be added to the waitlist

## 2020-04-07 NOTE — Telephone Encounter (Signed)
Pt is on the wait list for [facility]

## 2020-04-12 ENCOUNTER — Other Ambulatory Visit: Payer: Self-pay | Admitting: Otolaryngology

## 2020-04-12 DIAGNOSIS — H4922 Sixth [abducent] nerve palsy, left eye: Secondary | ICD-10-CM

## 2020-04-12 DIAGNOSIS — M47817 Spondylosis without myelopathy or radiculopathy, lumbosacral region: Secondary | ICD-10-CM | POA: Diagnosis not present

## 2020-04-12 DIAGNOSIS — R519 Headache, unspecified: Secondary | ICD-10-CM

## 2020-04-15 DIAGNOSIS — H4923 Sixth [abducent] nerve palsy, bilateral: Secondary | ICD-10-CM | POA: Diagnosis not present

## 2020-04-25 ENCOUNTER — Telehealth: Payer: Self-pay | Admitting: Neurology

## 2020-04-25 NOTE — Telephone Encounter (Signed)
Patient's wife stopped by the lobby requesting a sooner apt than what is available in September. Pt has an upcoming apt for MRI at [facility] but his follow up with [place] is not until September. Wife wants to make sure we give them a call with the results before his follow up OR that we can get him in sooner closer to the MRI date.Best call back is  [phone]

## 2020-04-29 DIAGNOSIS — H4923 Sixth [abducent] nerve palsy, bilateral: Secondary | ICD-10-CM | POA: Diagnosis not present

## 2020-05-05 ENCOUNTER — Other Ambulatory Visit: Payer: Self-pay

## 2020-05-05 ENCOUNTER — Ambulatory Visit
Admission: RE | Admit: 2020-05-05 | Discharge: 2020-05-05 | Disposition: A | Discharge status: Home / Self Care | Payer: Medicare Other | Source: Ambulatory Visit | Attending: Otolaryngology | Admitting: Otolaryngology

## 2020-05-05 DIAGNOSIS — R609 Edema, unspecified: Secondary | ICD-10-CM | POA: Diagnosis not present

## 2020-05-05 DIAGNOSIS — R519 Headache, unspecified: Secondary | ICD-10-CM

## 2020-05-05 DIAGNOSIS — H4922 Sixth [abducent] nerve palsy, left eye: Secondary | ICD-10-CM

## 2020-05-05 DIAGNOSIS — H9192 Unspecified hearing loss, left ear: Secondary | ICD-10-CM | POA: Diagnosis not present

## 2020-05-05 DIAGNOSIS — Z85820 Personal history of malignant melanoma of skin: Secondary | ICD-10-CM | POA: Diagnosis not present

## 2020-05-05 MED ORDER — GADOBENATE DIMEGLUMINE 529 MG/ML IV SOLN
15.0000 mL | Freq: Once | INTRAVENOUS | Status: AC | PRN
  Administered 2020-05-05: 15 mL via INTRAVENOUS

## 2020-05-12 ENCOUNTER — Other Ambulatory Visit (HOSPITAL_COMMUNITY): Payer: Self-pay | Admitting: Cardiology

## 2020-05-17 DIAGNOSIS — M47817 Spondylosis without myelopathy or radiculopathy, lumbosacral region: Secondary | ICD-10-CM | POA: Diagnosis not present

## 2020-05-17 DIAGNOSIS — M48061 Spinal stenosis, lumbar region without neurogenic claudication: Secondary | ICD-10-CM | POA: Diagnosis not present

## 2020-05-31 DIAGNOSIS — H4923 Sixth [abducent] nerve palsy, bilateral: Secondary | ICD-10-CM | POA: Diagnosis not present

## 2020-06-08 ENCOUNTER — Institutional Professional Consult (permissible substitution): Payer: Medicare Other | Admitting: Neurology

## 2020-06-13 ENCOUNTER — Other Ambulatory Visit: Payer: Self-pay | Admitting: Neurology

## 2020-06-13 NOTE — Progress Notes (Signed)
NEUROLOGY FOLLOW UP OFFICE NOTE  Danny [name] [id number]  HISTORY OF PRESENT ILLNESS: Danny [name]. Danny [name] is an 84 year old right-handed White male with chronic hepatitis, cervical disc disease, lumbar spine disease s/p surgery and CAD who follows up for neuropathy.  He is accompanied by his wife who provides collateral history.  UPDATE: Lyrica was changed to gabapentin 38m twice daily.  Labs from May include B12 459, folate >23.7, Mg 2.2, TSH 2.17, free T4 0.83, and SPEP/IFE negative for monoclonal protein.  Neuropathic pain improved but still has discomfort a few times a week.  Pain aggravated between 6 and 10 PM  In June, he developed double vision.  He reports history of 6th nerve palsy 20 years ago and thought it was a recurrence.  He saw ophthalmology who diagnosed left 6th nerve palsy .  He also had a left sided headache above the left eye, which was previously found to be sinusitis. He saw ENT for possible sinus infection, which was ruled out.  July include sed rate 9, CRP 0.8.  MRI of brain with and without contrast on 05/05/2020 personally reviewed and showed chronic small vessel ischemic changes in the cerebral white matter but no brainstem or cavernous sinus abnormalities.  Double vision only noticeable unless he looks to the left greater than right.  Dull left frontal headache about 3 times a week.  It happens when he gets up in the morning and is resolved by afternoon.  The headaches are chronic, for many years.  HISTORY: In 2008, he began experiencing left upper extremity numbness and tingling following rotator cuff surgery.  It involves only the hand and is fairly constant.  No pain or weakness.  He is still able to perform daily activities such as chores around the house or playing golf.  EMG in 2014 reportedly negative.  [facility]  NCV-EMG of left upper extremity was reportedly normal.  MRI of cervical spine showed mild  degenerative disc disease but no significant central or foraminal stenosis.  MRI of brain showed mild age-related changes but overall unremarkable.  No specific diagnosis was determined.  It has been unchanged over the years.  Repeat NCV-EMG of left upper extremity on 11/25/2019 was normal.    For many years, he has had numbness and tingling in the feet as well as intermittent muscle cramps in the calves.  He has changed from Lipitor to Crestor with no improvement.  He typically treats with mustard and apple cider vinegar.  At night, he notes burning and stinging pain in the feet.  Bood work in 2014 showed negative ANA, negative RF, CRP 1.3, Hgb A1c 6.3, CK 116, elevated TSH 5.370, but subsequent TSH has been normal.  He has history of multiple back surgeries.  He had sinal fusion L3-4 in 2010, followed by spinal fusion L2-3 in 2011.  To evaluate low back pain with right leg numbness, he underwent MRI of lumbar spine on 03/26/2015 which showed interbody fusion at L2-3 and L3-4 without significant stenosis and disc degeneration and spurring causing right foraminal encroachment at L4-5 and L5-S1.   He underwent extra-foraminal microdiskectomy at L4-5 and L5-S1 in January 2017 but then developed left lower back pain into the hip.  Myelogram on 07/12/2016 showed solid fusion at L2-3 and L3-4 as well as multilevel subarticular and foraminal stenosis.  NCV-EMG of the lower extremities from 11/30/2019 showed moderate to severe mixed axonal and demyelinating sensorimotor polyneuropathy but no evidence  of lumbar radiculopathy, plexopathy or myopathy.    Past medication: Lyrica  Imaging: 05/20/2013 MRI CERVICAL SPINE WO:  Mildly abnormal MRI scan of the cervical spine showing exaggerated forward lordotic curvature and minor disc degenerative changes without significant compression. 06/18/2013 MRI BRAIN W WO:  Abnormal MRI scan of the brain showing mild changes of chronic microvascular ischemia and generalized cerebral  atrophy. 03/26/2015 MRI LUMBAR SPINE WO:  interbody fusion at L2-3 and L3-4 without significant stenosis.  Lumbar dextroscoliosis.  Disc degeneration and spurring causing right foraminal encroachment.  L4-5 and L5-S1, similar to 2011. 07/12/2016 CT MYELOGRAM LUMBAR SPINE:  1. Solid fusion at L2-3 and L3-4.  2. Progressive adjacent level disease at L1-2 with progressive moderate left and mild right subarticular stenosis.  3. Moderate left foraminal narrowing at L1-2.  4. Moderate left subarticular and mild left foraminal stenosis at L2-3 without significant interval change.  5. Moderate left subarticular and mild left foraminal narrowing at L3-4 is stable. This is predominantly due to facet disease.  6. Mild subarticular and foraminal narrowing bilaterally at L4-5.  7. Mild subarticular narrowing at L5-S1 is worse on the right.  8. Moderate right and mild left foraminal narrowing at L5-S1 is stable.  PAST MEDICAL HISTORY: Past Medical History:  Diagnosis Date  . Allergic rhinitis   . Arthritis   . BPH (benign prostatic hypertrophy)    Dr. Alinda Money  . Cervical disc disease   . Coronary artery disease    Patient developed exertional dyspnea. ETT-myoview (8/12) with 11 METS, normal LV EF, normal perfusion at rest and stress but elevated TID ratio. Coronary CT angiogram in 9/12 showed equivocal significant disease in the CFX. Cath in 10/12 showed EF 55-60%, 30% mid RCA, 40% distal RCA, 40% proximal LAD. ETT-Myoview (10/14): Diaphragmatic attenuation, no ischemia, EF 62%, normal study  . Detached vitreous humor   . Diverticulosis   . Diverticulosis   . GERD (gastroesophageal reflux disease)   . Heart murmur   . Hemorrhoids   . Impaired fasting glucose   . Ingrown toenail   . Lumbar radiculopathy   . Numbness and tingling in left arm   . Onychomycosis   . Rotator cuff disorder   . Sixth nerve palsy 2001    MEDICATIONS: Current Outpatient Medications on File Prior to Visit  Medication Sig  Dispense Refill  . acetaminophen (TYLENOL) 500 MG tablet Take 500 mg by mouth every 6 (six) hours as needed.    [name] aspirin 81 MG tablet Take 81 mg by mouth daily.      . Calcium Citrate-Vitamin D (CITRACAL + D PO) Take 1 tablet by mouth daily.    . Coenzyme Q10 (CO Q 10 PO) Take 400 mg by mouth daily.    [name] docusate sodium (COLACE) 100 MG capsule Take 100 mg by mouth 2 (two) times daily.    . famotidine (PEPCID) 20 MG tablet Take 20 mg by mouth 2 (two) times daily.    . finasteride (PROSCAR) 5 MG tablet Take 5 mg by mouth daily.      [name] gabapentin (NEURONTIN) 300 MG capsule Take 1 capsule (300 mg total) by mouth 2 (two) times daily. 60 capsule 3  . Magnesium Oxide 400 MG CAPS Take 1 capsule (400 mg total) by mouth daily. 30 capsule 11  . Multiple Vitamin (MULTIVITAMIN) tablet Take 1 tablet by mouth daily.      [name] NITROSTAT 0.4 MG SL tablet ONE TABLET UNDER TONGUE WHEN NEEDED FOR CHEST PAIN. MAY REPEAT IN  5 MINUTES. 25 tablet 6  . RESTASIS 0.05 % ophthalmic emulsion Place 1 drop into both eyes 2 (two) times daily.     . rosuvastatin (CRESTOR) 5 MG tablet TAKE 1 TABLET ONCE DAILY. 90 tablet 0  . Saw Palmetto, Serenoa repens, (SAW PALMETTO PO) Take 1 tablet by mouth 2 (two) times daily.    . tamsulosin (FLOMAX) 0.4 MG CAPS capsule Take 0.4 mg by mouth.    . traMADol (ULTRAM) 50 MG tablet Take 50 mg 2 (two) times daily by mouth.    . Turmeric 500 MG CAPS Take 1,000 mg by mouth.    . Wheat Dextrin (BENEFIBER PO) Take 1 Dose by mouth daily as needed.    . zolpidem (AMBIEN) 10 MG tablet Take 10 mg by mouth at bedtime as needed for sleep.     No current facility-administered medications on file prior to visit.    ALLERGIES: No Known Allergies  FAMILY HISTORY: Family History  Problem Relation Age of Onset  . Cancer Mother        multiple myeloma  . Hypertension Mother   . Stroke Father   . Heart attack Neg Hx     SOCIAL HISTORY: Social History   Socioeconomic History  . Marital status:  Married    Spouse name: Not on file  . Number of children: Not on file  . Years of education: Not on file  . Highest education level: Not on file  Occupational History  . 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 Smoking status: Never Smoker  . Smokeless tobacco: Never Used  Substance and Sexual Activity  . Alcohol use: Yes    Comment: occasionally  . Drug use: No  . Sexual activity: Not on file  Other Topics Concern  . Not on file  Social History Narrative   Right handed   Lives with wife two story home   Social Determinants of Health   Financial Resource Strain:   . Difficulty of Paying Living Expenses: Not on file  Food Insecurity:   . Worried About Running Out of Food in the Last Year: Not on file  . Ran Out of Food in the Last Year: Not on file  Transportation Needs:   . Lack of Transportation (Medical): Not on file  . Lack of Transportation (Non-Medical): Not on file  Physical Activity:   . Days of Exercise per Week: Not on file  . Minutes of Exercise per Session: Not on file  Stress:   . Feeling of Stress : Not on file  Social Connections:   . Frequency of Communication with Friends and Family: Not on file  . Frequency of Social Gatherings with Friends and Family: Not on file  . Attends Religious Services: Not on file  . Active Member of Clubs or Organizations: Not on file  . Attends Club or Organization Meetings: Not on file  . Marital Status: Not on file  Intimate Partner Violence:   . Fear of Current or Ex-Partner: Not on file  . Emotionally Abused: Not on file  . Physically Abused: Not on file  . Sexually Abused: Not on file    PHYSICAL EXAM: Blood pressure (!) 154/94, pulse 69, height 5' 10" (1.778 m), weight 164 lb (74.4 kg), SpO2 99 %. General: No acute distress.  Patient appears well-groomed.   Head:  Normocephalic/atraumatic Eyes:  Fundi examined but not visualized Neck: supple, no    paraspinal tenderness, full range of motion Heart:  Regular rate and rhythm Lungs:  Clear to auscultation bilaterally Back: No paraspinal tenderness Neurological Exam: alert and oriented to person, place, and time. Attention span and concentration intact, recent and remote memory intact, fund of knowledge intact.  Speech fluent and not dysarthric, language intact.  CN II-XII intact. Bulk and tone normal, muscle strength 5/5 throughout.  Sensation to light touch, temperature and vibration intact.  Deep tendon reflexes 2+ throughout, toes downgoing.  Finger to nose and heel to shin testing intact.  Gait normal, Romberg negative.  IMPRESSION: 1.  Left 6th nerve palsy.  Likely ischemic but will check ACE level.   2.  Polyneuropathy, idiopathic 3.  History of multiple lumbar spine surgeries  PLAN: 1.  Increase gabapentin to 300mg three times daily. 2.  Will check ACE level. 3.  Follow up in 6 months.  Adam Jaffe, DO  CC: Hal Stoneking, MD       

## 2020-06-15 ENCOUNTER — Encounter: Payer: Self-pay | Admitting: Neurology

## 2020-06-15 ENCOUNTER — Other Ambulatory Visit: Payer: Self-pay

## 2020-06-15 ENCOUNTER — Other Ambulatory Visit: Payer: Medicare Other

## 2020-06-15 ENCOUNTER — Ambulatory Visit (INDEPENDENT_AMBULATORY_CARE_PROVIDER_SITE_OTHER): Payer: Medicare Other | Admitting: Neurology

## 2020-06-15 VITALS — BP 154/94 | HR 69 | Ht 70.0 in | Wt 164.0 lb

## 2020-06-15 DIAGNOSIS — G6289 Other specified polyneuropathies: Secondary | ICD-10-CM | POA: Diagnosis not present

## 2020-06-15 DIAGNOSIS — H4922 Sixth [abducent] nerve palsy, left eye: Secondary | ICD-10-CM

## 2020-06-15 MED ORDER — GABAPENTIN 300 MG PO CAPS: 300.0000 mg | ORAL_CAPSULE | Freq: Three times a day (TID) | ORAL | 5 refills | Status: DC

## 2020-06-15 NOTE — Patient Instructions (Signed)
1.  Increase gabapentin to three times daily.  Take afternoon dose around 4 pm. 2.  Will check ACE level. 3.  Follow up 4 to 6 months.

## 2020-06-17 LAB — ANGIOTENSIN CONVERTING ENZYME: Angiotensin-Converting Enzyme: 11 U/L (ref 9–67)

## 2020-06-22 DIAGNOSIS — Z23 Encounter for immunization: Secondary | ICD-10-CM | POA: Diagnosis not present

## 2020-07-04 DIAGNOSIS — Z23 Encounter for immunization: Secondary | ICD-10-CM | POA: Diagnosis not present

## 2020-07-06 DIAGNOSIS — E7800 Pure hypercholesterolemia, unspecified: Secondary | ICD-10-CM | POA: Diagnosis not present

## 2020-07-06 DIAGNOSIS — N401 Enlarged prostate with lower urinary tract symptoms: Secondary | ICD-10-CM | POA: Diagnosis not present

## 2020-07-19 DIAGNOSIS — M47817 Spondylosis without myelopathy or radiculopathy, lumbosacral region: Secondary | ICD-10-CM | POA: Diagnosis not present

## 2020-07-26 DIAGNOSIS — H4923 Sixth [abducent] nerve palsy, bilateral: Secondary | ICD-10-CM | POA: Diagnosis not present

## 2020-08-09 ENCOUNTER — Encounter (HOSPITAL_COMMUNITY): Payer: Self-pay | Admitting: Cardiology

## 2020-08-09 ENCOUNTER — Other Ambulatory Visit: Payer: Self-pay

## 2020-08-09 ENCOUNTER — Ambulatory Visit (HOSPITAL_COMMUNITY)
Admission: RE | Admit: 2020-08-09 | Discharge: 2020-08-09 | Disposition: A | Discharge status: Home / Self Care | Payer: Medicare Other | Source: Ambulatory Visit | Attending: Cardiology | Admitting: Cardiology

## 2020-08-09 VITALS — BP 136/90 | HR 63 | Wt 161.8 lb

## 2020-08-09 DIAGNOSIS — Z823 Family history of stroke: Secondary | ICD-10-CM | POA: Insufficient documentation

## 2020-08-09 DIAGNOSIS — Z7982 Long term (current) use of aspirin: Secondary | ICD-10-CM | POA: Insufficient documentation

## 2020-08-09 DIAGNOSIS — R0600 Dyspnea, unspecified: Secondary | ICD-10-CM

## 2020-08-09 DIAGNOSIS — G629 Polyneuropathy, unspecified: Secondary | ICD-10-CM | POA: Insufficient documentation

## 2020-08-09 DIAGNOSIS — I5032 Chronic diastolic (congestive) heart failure: Secondary | ICD-10-CM

## 2020-08-09 DIAGNOSIS — R0609 Other forms of dyspnea: Secondary | ICD-10-CM

## 2020-08-09 DIAGNOSIS — Z79899 Other long term (current) drug therapy: Secondary | ICD-10-CM | POA: Insufficient documentation

## 2020-08-09 DIAGNOSIS — E785 Hyperlipidemia, unspecified: Secondary | ICD-10-CM | POA: Diagnosis not present

## 2020-08-09 DIAGNOSIS — I350 Nonrheumatic aortic (valve) stenosis: Secondary | ICD-10-CM | POA: Diagnosis not present

## 2020-08-09 DIAGNOSIS — I2510 Atherosclerotic heart disease of native coronary artery without angina pectoris: Secondary | ICD-10-CM | POA: Insufficient documentation

## 2020-08-09 LAB — LIPID PANEL
Cholesterol: 147 mg/dL (ref 0–200)
HDL: 68 mg/dL (ref >40)
LDL Cholesterol: 68 mg/dL (ref 0–99)
Total CHOL/HDL Ratio: 2.2 RATIO
Triglycerides: 55 mg/dL (ref <150)
VLDL: 11 mg/dL (ref 0–40)

## 2020-08-09 LAB — BASIC METABOLIC PANEL
Anion gap: 10 (ref 5–15)
BUN: 14 mg/dL (ref 8–23)
CO2: 26 mmol/L (ref 22–32)
Calcium: 10.3 mg/dL (ref 8.9–10.3)
Chloride: 100 mmol/L (ref 98–111)
Creatinine, Ser: 0.83 mg/dL (ref 0.61–1.24)
GFR, Estimated: >60 mL/min (ref >60)
Glucose, Bld: 112 mg/dL — ABNORMAL HIGH (ref 70–99)
Potassium: 3.8 mmol/L (ref 3.5–5.1)
Sodium: 136 mmol/L (ref 135–145)

## 2020-08-09 NOTE — Progress Notes (Signed)
Patient ID: Danny [name], male   DOB: [birth year], 84 y.o.   MRN: [id number] PCP: Dr. Felipa Eth Cardiology: Dr. Aundra Dubin  84 y.o. yo with history of nonobstructive CAD presents for followup of CAD and aortic stenosis.  He last had an ETT-Cardiolite in 6/16 that was normal.  Echo in 9/17 showed EF 55-60%, mild aortic stenosis, 4.0 cm ascending aorta.  MRA chest in 12/18 showed tortuous thoracic aorta without aneurysm. Echo in 11/19 showed EF 60-65%, mild AS/mild AI.   BP is elevated today, but he was under stress and running late this morning.  Normally, SBP in 120s.  He has a polyneuropathy of uncertain etiology with tingling/burning in his feet that has moved to fingers though symptoms in hands are mild.  Short of breath if he tries to jog.  Able to play golf without problems (played 4 times last week).  No chest pain.  No palpitations.    ECG (personally reviewed): NSR, 1st degree AVB  Labs (8/14): K 3.9, creatinine 0.89 Labs (12/14): LDL 62, HDL 48   Labs (6/16): LDL 56 Labs (1/17): creatinine 0.88, HCT 43.9 Labs (8/17): K 4.3, creatinine 0.88, LFTs normal Labs (9/17): CK normal, Mg normal Labs (11/17): LDL 32, HDL 52 Labs (11/18): LDL 50 Labs (11/19): LDL 57, HDL 53, K 4, creatinine 0.82 Labs (3/21): LDL 53  PMH: 1. Rotator cuff surgery 2. Diverticulosis 3. Chronic persistent hepatitis 4. Impaired fasting glucose 5. 6th nerve palsy in 2001 6. Allergic rhinitis 7. Low back pain s/p several operations.  Most recently had microdiscectomy in 1/17.  8. CAD: Patient developed exertional dyspnea.  ETT-myoview (8/12) with 11 METS, normal LV EF, normal perfusion at rest and stress but elevated TID ratio.  Coronary CT angiogram in 9/12 showed equivocal significant disease in the CFX.  Cath in 10/12 showed EF 55-60%, 30% mid RCA, 40% distal RCA, 40% proximal LAD.  ETT-Cardiolite in 10/14 showed EF 62%, probably normal study with diaphragmatic attenuation.   Echo (4/15) with EF 55-60%, mild MR.   - Cardiolite (6/16) with no ischemia/infarction.  - Echo (9/17) with EF 55-60%, mild aortic stenosis, 4.0 cm ascending aorta.  - Echo (11/19): EF 60-65%, mild AS mean gradient 9 mmHg with AVA 1.53 cm^2, mild AI.  9. GERD 10. Hyperlipidemia: Myalgias with atorvastatin.  11. Aortic stenosis: Mild on 9/17 echo. Mild on 11/19 echo.  12. Ascending aorta dilation: MRA chest (12/18) showed a tortuous ascending aorta without aneurysmal dilation.  13. Peripheral neuropathy of uncertain etiology.   FH: Father with CVA at 54  SH: Married, never smoked, rare ETOH.   ROS: All systems reviewed and negative except as per HPI.   Current Outpatient Medications  Medication Sig Dispense Refill  . acetaminophen (TYLENOL) 500 MG tablet Take 500 mg by mouth every 6 (six) hours as needed.    [name] aspirin 81 MG tablet Take 81 mg by mouth daily.      . Calcium Citrate-Vitamin D (CITRACAL + D PO) Take 1 tablet by mouth daily.    . Coenzyme Q10 (CO Q 10 PO) Take 400 mg by mouth daily.    [name] docusate sodium (COLACE) 100 MG capsule Take 100 mg by mouth 2 (two) times daily.    [name] esomeprazole (NEXIUM) 20 MG packet Take 20 mg by mouth daily before breakfast.    . finasteride (PROSCAR) 5 MG tablet Take 5 mg by mouth daily.      [name] gabapentin (NEURONTIN) 300 MG capsule Take 1 capsule (300 mg  total) by mouth 3 (three) times daily. 90 capsule 5  . Magnesium Oxide 400 MG CAPS Take 1 capsule (400 mg total) by mouth daily. 30 capsule 11  . Multiple Vitamin (MULTIVITAMIN) tablet Take 1 tablet by mouth daily.      [name] NITROSTAT 0.4 MG SL tablet ONE TABLET UNDER TONGUE WHEN NEEDED FOR CHEST PAIN. MAY REPEAT IN 5 MINUTES. 25 tablet 6  . psyllium (METAMUCIL) 58.6 % packet Take by mouth.    . RESTASIS 0.05 % ophthalmic emulsion Place 1 drop into both eyes 2 (two) times daily.     . rosuvastatin (CRESTOR) 5 MG tablet TAKE 1 TABLET ONCE DAILY. 90 tablet 0  . Saw Palmetto, Serenoa repens, (SAW PALMETTO PO) Take 1 tablet by mouth 2 (two)  times daily.    . tamsulosin (FLOMAX) 0.4 MG CAPS capsule Take 0.4 mg by mouth.    . traMADol (ULTRAM) 50 MG tablet Take 50 mg 2 (two) times daily by mouth.    . traZODone (DESYREL) 50 MG tablet Take 25 mg by mouth at bedtime as needed.    . Turmeric 500 MG CAPS Take 1,000 mg by mouth.    . zolpidem (AMBIEN) 10 MG tablet Take 10 mg by mouth at bedtime as needed for sleep.     No current facility-administered medications for this encounter.    BP 136/90   Pulse 63   Wt 73.4 kg (161 lb 12.8 oz)   SpO2 99%   BMI 23.22 kg/m  General: NAD Neck: No JVD, no thyromegaly or thyroid nodule.  Lungs: Clear to auscultation bilaterally with normal respiratory effort. CV: Nondisplaced PMI.  Heart regular S1/S2, no S3/S4, 2/6 SEM RUSB with clear S2.  No peripheral edema.  No carotid bruit.  Normal pedal pulses.  Abdomen: Soft, nontender, no hepatosplenomegaly, no distention.  Skin: Intact without lesions or rashes.  Neurologic: Alert and oriented x 3.  Psych: Normal affect. Extremities: No clubbing or cyanosis.  HEENT: Normal.   Assessment/Plan:  1. CAD: Nonobstructive on prior cath.  Low risk Cardiolite in 6/16. No chest pain. - Continue ASA 81.  - Continue Crestor.  2. Hyperlipidemia: Goal LDL < 70 with known CAD.  Myalgias with atorvastatin, now tolerating Crestor.  Good LDL in 3/21.  3. Aortic stenosis: Mild on 11/19 echo.  - I will arrange for repeat echo to followup aortic stenosis.  4. Ascending aortic aneurysm: Mild, 4.0 cm on 9/17 echo. MRA chest in 12/18 showed tortuous aorta without frank aneurysmal dilation.   5. Peripheral neuropathy: polyneuropathy of uncertain etiology.  Also with history of aortic stenosis and LVH on last echo. Transthyretin amyloidosis is a concern with this clinical presentation.  - Check myeloma panel, urine immunofixation, and serum light chains.   - I will arrange for PYP scan.   Followup 1 year if amyloid workup unrevealing.   Loralie Champagne 08/09/2020

## 2020-08-09 NOTE — Patient Instructions (Signed)
Labs done today, your results will be available in MyChart, we will contact you for abnormal readings.  Your physician has requested that you have an echocardiogram. Echocardiography is a painless test that uses sound waves to create images of your heart. It provides your doctor with information about the size and shape of your heart and how well your heart's chambers and valves are working. This procedure takes approximately one hour. There are no restrictions for this procedure.  You have been ordered a PYP Scan.  This is done in the Radiology [facility] of [facility]  When you come for this test please plan to be there 2-3 hours.  ONCE APPROVED BY YOUR INSURANCE WE WILL CONTACT YOU TO SCHEDULE THIS  Your physician has requested that you regularly monitor and record your blood pressure readings at home. Please use the same machine at the same time of day to check your readings and record them to bring to your follow-up visit.  Please call our office in 1 year to schedule your follow up appointment  If you have any questions or concerns before your next appointment please send [place] a message through [place] or call our office at [phone].    TO LEAVE A MESSAGE FOR THE NURSE SELECT OPTION 2, PLEASE LEAVE A MESSAGE INCLUDING: . YOUR NAME . DATE OF BIRTH . CALL BACK NUMBER . REASON FOR CALL**this is important as we prioritize the call backs  [place] AS LONG AS YOU CALL BEFORE 4:00 PM  At the [facility], you and your health needs are our priority. As part of our continuing mission to provide you with exceptional heart care, we have created designated Provider Care Teams. These Care Teams include your primary Cardiologist (physician) and Advanced Practice Providers (APPs- Physician Assistants and Nurse Practitioners) who all work together to provide you with the care you need, when you need it.   You may see any of the following  providers on your designated Care Team at your next follow up: [name] Dr Glori Bickers . Dr Loralie Champagne . Darrick Grinder, NP . Lyda Jester, PA . Audry Riles, PharmD   Please be sure to bring in all your medications bottles to every appointment.

## 2020-08-10 LAB — MULTIPLE MYELOMA PANEL, SERUM
Albumin SerPl Elph-Mcnc: 3.8 g/dL (ref 2.9–4.4)
Albumin/Glob SerPl: 1.4 (ref 0.7–1.7)
Alpha 1: 0.4 g/dL (ref 0.0–0.4)
Alpha2 Glob SerPl Elph-Mcnc: 0.8 g/dL (ref 0.4–1.0)
B-Globulin SerPl Elph-Mcnc: 1 g/dL (ref 0.7–1.3)
Gamma Glob SerPl Elph-Mcnc: 0.7 g/dL (ref 0.4–1.8)
Globulin, Total: 2.8 g/dL (ref 2.2–3.9)
IgA: 132 mg/dL (ref 61–437)
IgG (Immunoglobin G), Serum: 672 mg/dL (ref 603–1613)
IgM (Immunoglobulin M), Srm: 57 mg/dL (ref 15–143)
Total Protein ELP: 6.6 g/dL (ref 6.0–8.5)

## 2020-08-10 LAB — IMMUNOFIXATION, URINE

## 2020-08-15 ENCOUNTER — Other Ambulatory Visit (HOSPITAL_COMMUNITY): Payer: Self-pay | Admitting: Cardiology

## 2020-08-16 DIAGNOSIS — M47817 Spondylosis without myelopathy or radiculopathy, lumbosacral region: Secondary | ICD-10-CM | POA: Diagnosis not present

## 2020-08-16 DIAGNOSIS — Z981 Arthrodesis status: Secondary | ICD-10-CM | POA: Diagnosis not present

## 2020-08-29 ENCOUNTER — Encounter (HOSPITAL_COMMUNITY): Payer: Self-pay

## 2020-08-30 ENCOUNTER — Other Ambulatory Visit: Payer: Self-pay

## 2020-08-30 ENCOUNTER — Ambulatory Visit (HOSPITAL_COMMUNITY)
Admission: RE | Admit: 2020-08-30 | Discharge: 2020-08-30 | Disposition: A | Discharge status: Home / Self Care | Payer: Medicare Other | Source: Ambulatory Visit | Attending: Cardiology | Admitting: Cardiology

## 2020-08-30 DIAGNOSIS — I2510 Atherosclerotic heart disease of native coronary artery without angina pectoris: Secondary | ICD-10-CM | POA: Diagnosis not present

## 2020-08-30 DIAGNOSIS — I352 Nonrheumatic aortic (valve) stenosis with insufficiency: Secondary | ICD-10-CM | POA: Diagnosis not present

## 2020-08-30 DIAGNOSIS — E785 Hyperlipidemia, unspecified: Secondary | ICD-10-CM | POA: Diagnosis not present

## 2020-08-30 DIAGNOSIS — I350 Nonrheumatic aortic (valve) stenosis: Secondary | ICD-10-CM | POA: Diagnosis not present

## 2020-08-30 LAB — ECHOCARDIOGRAM COMPLETE
AR max vel: 1.67 cm2
AV Area VTI: 1.42 cm2
AV Area mean vel: 1.43 cm2
AV Mean grad: 12 mmHg
AV Peak grad: 20.2 mmHg
Ao pk vel: 2.25 m/s
Area-P 1/2: 2.99 cm2
P 1/2 time: 1018 msec
S' Lateral: 3.2 cm

## 2020-08-30 NOTE — Progress Notes (Signed)
  Echocardiogram 2D Echocardiogram has been performed.  Danny [name] 08/30/2020, 9:40 AM

## 2020-09-14 ENCOUNTER — Encounter (HOSPITAL_COMMUNITY)
Admission: RE | Admit: 2020-09-14 | Discharge: 2020-09-14 | Disposition: A | Discharge status: Home / Self Care | Payer: Medicare Other | Source: Ambulatory Visit | Attending: Cardiology | Admitting: Cardiology

## 2020-09-14 ENCOUNTER — Other Ambulatory Visit: Payer: Self-pay

## 2020-09-14 DIAGNOSIS — I5032 Chronic diastolic (congestive) heart failure: Secondary | ICD-10-CM | POA: Diagnosis not present

## 2020-09-14 DIAGNOSIS — E854 Organ-limited amyloidosis: Secondary | ICD-10-CM | POA: Diagnosis not present

## 2020-09-14 MED ORDER — TECHNETIUM TC 99M PYROPHOSPHATE
20.8000 | Freq: Once | INTRAVENOUS | Status: AC | PRN
  Administered 2020-09-14: 12:00:00 20.8 via INTRAVENOUS
  Filled 2020-09-14: qty 21

## 2020-09-15 ENCOUNTER — Encounter (HOSPITAL_COMMUNITY): Payer: Self-pay

## 2020-09-19 DIAGNOSIS — M47817 Spondylosis without myelopathy or radiculopathy, lumbosacral region: Secondary | ICD-10-CM | POA: Diagnosis not present

## 2020-10-04 DIAGNOSIS — N401 Enlarged prostate with lower urinary tract symptoms: Secondary | ICD-10-CM | POA: Diagnosis not present

## 2020-10-04 DIAGNOSIS — K219 Gastro-esophageal reflux disease without esophagitis: Secondary | ICD-10-CM | POA: Diagnosis not present

## 2020-10-04 DIAGNOSIS — E7800 Pure hypercholesterolemia, unspecified: Secondary | ICD-10-CM | POA: Diagnosis not present

## 2020-10-10 DIAGNOSIS — M47817 Spondylosis without myelopathy or radiculopathy, lumbosacral region: Secondary | ICD-10-CM | POA: Diagnosis not present

## 2020-10-17 DIAGNOSIS — M25511 Pain in right shoulder: Secondary | ICD-10-CM | POA: Diagnosis not present

## 2020-11-01 DIAGNOSIS — M47817 Spondylosis without myelopathy or radiculopathy, lumbosacral region: Secondary | ICD-10-CM | POA: Diagnosis not present

## 2020-11-01 NOTE — Progress Notes (Signed)
NEUROLOGY FOLLOW UP OFFICE NOTE  Danny [name] [id number]   Subjective:  Danny [name] is an 85 year old right-handed White male with chronic hepatitis, cervical disc disease, lumbar spine disease s/p surgery and CAD who follows up for polyneuropathy.  UPDATE: Last visit, gabapentin was increased to 366m TID   Pain has improved.  Overall stable.  Aggravated if on his feet.  He says he is 95% better.  For further evaluation of left 6th nerve palsy, ACE was checked, which was 11.  Vision is resolved.  HISTORY: In 2008, he began experiencing left upper extremity numbness and tinglingfollowing rotator cuff surgery. It involves only the hand and is fairly constant. No pain or weakness. He is still able to perform daily activities such as chores around the house or playing golf. EMG in 2014 reportedly negative. [facility]. NCV-EMG of left upper extremity was reportedly normal. MRI of cervical spine showed mild degenerative disc disease but no significant central or foraminal stenosis. MRI of brain showed mild age-related changes but overall unremarkable. No specific diagnosis was determined. It has been unchanged over the years. Repeat NCV-EMG of left upper extremity on 11/25/2019 was normal.   For many years, he has had numbness and tingling in the feet as well as intermittent muscle cramps in the calves. He has changed from Lipitor to Crestor with no improvement. He typically treats with mustard and apple cider vinegar. At night, he notes burning and stinging pain in the feet. Bood workin 2014showed negative ANA, negative RF, CRP 1.3, Hgb A1c 6.3, CK 116, elevated TSH 5.370, butsubsequent TSH has been normal. He has history of multiple back surgeries. He had sinal fusion L3-4 in 2010, followed by spinal fusion L2-3 in 2011.To evaluate low back pain with right leg numbness, he underwent MRI of lumbar spine on 03/26/2015  which showed interbody fusion at L2-3 and L3-4 without significant stenosis and disc degeneration and spurring causing right foraminal encroachment at L4-5 and L5-S1. He underwent extra-foraminal microdiskectomy at L4-5 and L5-S1in January 2017 but then developed left lower back pain into the hip. Myelogram on 07/12/2016 showed solid fusion at L2-3 and L3-4 as well as multilevel subarticular and foraminal stenosis. NCV-EMG of the lower extremities from 11/30/2019 showed moderate to severe mixed axonal and demyelinating sensorimotor polyneuropathybut no evidence of lumbar radiculopathy, plexopathy or myopathy. Labs from May 2021 include B12 459, folate >23.7, Mg 2.2, TSH 2.17, free T4 0.83, and SPEP/IFE negative for monoclonal protein.   In June 2021, he developed double vision.  He reports history of 6th nerve palsy 20 years ago and thought it was a recurrence.  He saw ophthalmology who diagnosed left 6th nerve palsy .  He also had a left sided headache above the left eye, which was previously found to be sinusitis. He saw ENT for possible sinus infection, which was ruled out.  July include sed rate 9, CRP 0.8.  MRI of brain with and without contrast on 05/05/2020 showed chronic small vessel ischemic changes in the cerebral white matter but no brainstem or cavernous sinus abnormalities.  Double vision only noticeable unless he looks to the left greater than right.  Dull left frontal headache about 3 times a week.  It happens when he gets up in the morning and is resolved by afternoon.  The headaches are chronic, for many years.  Past medication: Lyrica  Imaging: 05/20/2013 MRI CERVICAL SPINE WRN:[id number] MRI scan of the cervical spine  showing exaggerated forward lordotic curvature and minor disc degenerative changes without significant compression. 06/18/2013 MRI BRAIN W OI:[id number] MRI scan of the brain showing mild changes of chronic microvascular ischemia and generalized cerebral  atrophy. 03/26/2015 MRI LUMBAR SPINE VQ:[id number] fusion at L2-3 and L3-4 without significant stenosis.Lumbar dextroscoliosis.Disc degeneration and spurring causing right foraminal encroachment.L4-5 and L5-S1, similar to 2011. 07/12/2016 CT MYELOGRAM LUMBAR SPINE:1. Solid fusion at L2-3 and L3-4. 2. Progressive adjacent level disease at L1-2 with progressive moderate left and mild right subarticular stenosis. 3. Moderate left foraminal narrowing at L1-2. 4. Moderate left subarticular and mild left foraminal stenosis at L2-3 without significant interval change. 5. Moderate left subarticular and mild left foraminal narrowing at L3-4 is stable. This is predominantly due to facet disease. 6. Mild subarticular and foraminal narrowing bilaterally at L4-5. 7. Mild subarticular narrowing at L5-S1 is worse on the right. 8. Moderate right and mild left foraminal narrowing at L5-S1 is stable. 05/05/2020 MRI BRAIN W WO:  No explanation for 6 nerve palsy. Brainstem and cavernous sinus normal. No orbital mass.  No acute abnormality. Progression of chronic microvascular ischemic change in the white matter compared with MRI of 06/18/2013  PAST MEDICAL HISTORY: Past Medical History:  Diagnosis Date  . Allergic rhinitis   . Arthritis   . BPH (benign prostatic hypertrophy)    Dr. Alinda Money  . Cervical disc disease   . Coronary artery disease    Patient developed exertional dyspnea. ETT-myoview (8/12) with 11 METS, normal LV EF, normal perfusion at rest and stress but elevated TID ratio. Coronary CT angiogram in 9/12 showed equivocal significant disease in the CFX. Cath in 10/12 showed EF 55-60%, 30% mid RCA, 40% distal RCA, 40% proximal LAD. ETT-Myoview (10/14): Diaphragmatic attenuation, no ischemia, EF 62%, normal study  . Detached vitreous humor   . Diverticulosis   . Diverticulosis   . GERD (gastroesophageal reflux disease)   . Heart murmur   . Hemorrhoids   . Impaired fasting glucose   .  Ingrown toenail   . Lumbar radiculopathy   . Numbness and tingling in left arm   . Onychomycosis   . Rotator cuff disorder   . Sixth nerve palsy 2001    MEDICATIONS: Current Outpatient Medications on File Prior to Visit  Medication Sig Dispense Refill  . acetaminophen (TYLENOL) 500 MG tablet Take 500 mg by mouth every 6 (six) hours as needed.    [name] aspirin 81 MG tablet Take 81 mg by mouth daily.      . Calcium Citrate-Vitamin D (CITRACAL + D PO) Take 1 tablet by mouth daily.    . Coenzyme Q10 (CO Q 10 PO) Take 400 mg by mouth daily.    [name] docusate sodium (COLACE) 100 MG capsule Take 100 mg by mouth 2 (two) times daily.    [name] esomeprazole (NEXIUM) 20 MG packet Take 20 mg by mouth daily before breakfast.    . finasteride (PROSCAR) 5 MG tablet Take 5 mg by mouth daily.      [name] gabapentin (NEURONTIN) 300 MG capsule Take 1 capsule (300 mg total) by mouth 3 (three) times daily. 90 capsule 5  . Magnesium Oxide 400 MG CAPS Take 1 capsule (400 mg total) by mouth daily. 30 capsule 11  . Multiple Vitamin (MULTIVITAMIN) tablet Take 1 tablet by mouth daily.      [name] NITROSTAT 0.4 MG SL tablet ONE TABLET UNDER TONGUE WHEN NEEDED FOR CHEST PAIN. MAY REPEAT IN 5 MINUTES. 25 tablet 6  . psyllium (METAMUCIL)  58.6 % packet Take by mouth.    . RESTASIS 0.05 % ophthalmic emulsion Place 1 drop into both eyes 2 (two) times daily.     . rosuvastatin (CRESTOR) 5 MG tablet TAKE 1 TABLET ONCE DAILY. 90 tablet 0  . Saw Palmetto, Serenoa repens, (SAW PALMETTO PO) Take 1 tablet by mouth 2 (two) times daily.    . tamsulosin (FLOMAX) 0.4 MG CAPS capsule Take 0.4 mg by mouth.    . traMADol (ULTRAM) 50 MG tablet Take 50 mg 2 (two) times daily by mouth.    . traZODone (DESYREL) 50 MG tablet Take 25 mg by mouth at bedtime as needed.    . Turmeric 500 MG CAPS Take 1,000 mg by mouth.    . zolpidem (AMBIEN) 10 MG tablet Take 10 mg by mouth at bedtime as needed for sleep.     No current facility-administered medications on file  prior to visit.    ALLERGIES: No Known Allergies  FAMILY HISTORY: Family History  Problem Relation Age of Onset  . Cancer Mother        multiple myeloma  . Hypertension Mother   . Stroke Father   . Heart attack Neg Hx     SOCIAL HISTORY: Social History   Socioeconomic History  . Marital status: Married    Spouse name: Not on file  . Number of children: Not on file  . Years of education: Not on file  . Highest education level: Not on file  Occupational History  . 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 Smoking status: Never Smoker  . Smokeless tobacco: Never Used  Substance and Sexual Activity  . Alcohol use: Yes    Comment: occasionally  . Drug use: No  . Sexual activity: Not on file  Other Topics Concern  . Not on file  Social History Narrative   Right handed   Lives with wife two story home   Social Determinants of Health   Financial Resource Strain: Not on file  Food Insecurity: Not on file  Transportation Needs: Not on file  Physical Activity: Not on file  Stress: Not on file  Social Connections: Not on file  Intimate Partner Violence: Not on file     Objective:  Blood pressure 134/85, pulse 69, height 5' 10"  (1.778 m), weight 165 lb 9.6 oz (75.1 kg), SpO2 99 %. General: No acute distress.  Patient appears well-groomed.   Head:  Normocephalic/atraumatic Eyes:  Fundi examined but not visualized Back: No paraspinal tenderness Neurological Exam: alert and oriented to person, place, and time. Attention span and concentration intact, recent and remote memory intact, fund of knowledge intact.  Speech fluent and not dysarthric, language intact.  CN II-XII intact. Bulk and tone normal, muscle strength 5/5 throughout.  Sensation to temperature and vibration reduced in toes.  Deep tendon reflexes 2+ throughout, toes downgoing.  Finger to nose and heel to shin testing intact.  Gait normal, Romberg  negative.   Assessment/Plan:   1.  Idiopathic polyneuropathy 2.  Left sixth nerve palsy, likely ischemic 3.  History of multiple lumbar spine surgeries  1.  Gabapentin 32m TID 2.  Follow up 9 months.  AMetta Clines DO  CC: HLajean Manes MD

## 2020-11-02 ENCOUNTER — Ambulatory Visit (INDEPENDENT_AMBULATORY_CARE_PROVIDER_SITE_OTHER): Payer: Medicare Other | Admitting: Neurology

## 2020-11-02 ENCOUNTER — Other Ambulatory Visit: Payer: Self-pay

## 2020-11-02 ENCOUNTER — Encounter: Payer: Self-pay | Admitting: Neurology

## 2020-11-02 VITALS — BP 134/85 | HR 69 | Ht 70.0 in | Wt 165.6 lb

## 2020-11-02 DIAGNOSIS — G6289 Other specified polyneuropathies: Secondary | ICD-10-CM | POA: Diagnosis not present

## 2020-11-02 DIAGNOSIS — H4922 Sixth [abducent] nerve palsy, left eye: Secondary | ICD-10-CM

## 2020-11-02 NOTE — Patient Instructions (Signed)
Continue gabapentin 300mg  three times daily

## 2020-11-14 DIAGNOSIS — M25511 Pain in right shoulder: Secondary | ICD-10-CM | POA: Diagnosis not present

## 2020-11-22 DIAGNOSIS — M47817 Spondylosis without myelopathy or radiculopathy, lumbosacral region: Secondary | ICD-10-CM | POA: Diagnosis not present

## 2020-12-01 DIAGNOSIS — L718 Other rosacea: Secondary | ICD-10-CM | POA: Diagnosis not present

## 2020-12-01 DIAGNOSIS — D1801 Hemangioma of skin and subcutaneous tissue: Secondary | ICD-10-CM | POA: Diagnosis not present

## 2020-12-01 DIAGNOSIS — I8393 Asymptomatic varicose veins of bilateral lower extremities: Secondary | ICD-10-CM | POA: Diagnosis not present

## 2020-12-01 DIAGNOSIS — D229 Melanocytic nevi, unspecified: Secondary | ICD-10-CM | POA: Diagnosis not present

## 2020-12-01 DIAGNOSIS — Z85820 Personal history of malignant melanoma of skin: Secondary | ICD-10-CM | POA: Diagnosis not present

## 2020-12-01 DIAGNOSIS — L819 Disorder of pigmentation, unspecified: Secondary | ICD-10-CM | POA: Diagnosis not present

## 2020-12-07 DIAGNOSIS — K9089 Other intestinal malabsorption: Secondary | ICD-10-CM | POA: Diagnosis not present

## 2020-12-07 DIAGNOSIS — Z0000 Encounter for general adult medical examination without abnormal findings: Secondary | ICD-10-CM | POA: Diagnosis not present

## 2020-12-07 DIAGNOSIS — I350 Nonrheumatic aortic (valve) stenosis: Secondary | ICD-10-CM | POA: Diagnosis not present

## 2020-12-07 DIAGNOSIS — E7800 Pure hypercholesterolemia, unspecified: Secondary | ICD-10-CM | POA: Diagnosis not present

## 2020-12-07 DIAGNOSIS — R351 Nocturia: Secondary | ICD-10-CM | POA: Diagnosis not present

## 2020-12-07 DIAGNOSIS — G629 Polyneuropathy, unspecified: Secondary | ICD-10-CM | POA: Diagnosis not present

## 2020-12-07 DIAGNOSIS — Z1159 Encounter for screening for other viral diseases: Secondary | ICD-10-CM | POA: Diagnosis not present

## 2020-12-07 DIAGNOSIS — N401 Enlarged prostate with lower urinary tract symptoms: Secondary | ICD-10-CM | POA: Diagnosis not present

## 2020-12-07 DIAGNOSIS — Z79899 Other long term (current) drug therapy: Secondary | ICD-10-CM | POA: Diagnosis not present

## 2020-12-07 DIAGNOSIS — D692 Other nonthrombocytopenic purpura: Secondary | ICD-10-CM | POA: Diagnosis not present

## 2020-12-07 DIAGNOSIS — K219 Gastro-esophageal reflux disease without esophagitis: Secondary | ICD-10-CM | POA: Diagnosis not present

## 2020-12-14 DIAGNOSIS — K219 Gastro-esophageal reflux disease without esophagitis: Secondary | ICD-10-CM | POA: Diagnosis not present

## 2020-12-14 DIAGNOSIS — E7800 Pure hypercholesterolemia, unspecified: Secondary | ICD-10-CM | POA: Diagnosis not present

## 2020-12-14 DIAGNOSIS — N401 Enlarged prostate with lower urinary tract symptoms: Secondary | ICD-10-CM | POA: Diagnosis not present

## 2020-12-27 DIAGNOSIS — M47817 Spondylosis without myelopathy or radiculopathy, lumbosacral region: Secondary | ICD-10-CM | POA: Diagnosis not present

## 2020-12-27 DIAGNOSIS — Z981 Arthrodesis status: Secondary | ICD-10-CM | POA: Diagnosis not present

## 2021-01-17 DIAGNOSIS — N401 Enlarged prostate with lower urinary tract symptoms: Secondary | ICD-10-CM | POA: Diagnosis not present

## 2021-01-17 DIAGNOSIS — K219 Gastro-esophageal reflux disease without esophagitis: Secondary | ICD-10-CM | POA: Diagnosis not present

## 2021-01-17 DIAGNOSIS — E7800 Pure hypercholesterolemia, unspecified: Secondary | ICD-10-CM | POA: Diagnosis not present

## 2021-01-24 DIAGNOSIS — Z23 Encounter for immunization: Secondary | ICD-10-CM | POA: Diagnosis not present

## 2021-02-05 ENCOUNTER — Other Ambulatory Visit: Payer: Self-pay | Admitting: Neurology

## 2021-03-07 DIAGNOSIS — M79645 Pain in left finger(s): Secondary | ICD-10-CM | POA: Diagnosis not present

## 2021-03-22 DIAGNOSIS — K219 Gastro-esophageal reflux disease without esophagitis: Secondary | ICD-10-CM | POA: Diagnosis not present

## 2021-03-22 DIAGNOSIS — N401 Enlarged prostate with lower urinary tract symptoms: Secondary | ICD-10-CM | POA: Diagnosis not present

## 2021-03-22 DIAGNOSIS — E7800 Pure hypercholesterolemia, unspecified: Secondary | ICD-10-CM | POA: Diagnosis not present

## 2021-04-04 DIAGNOSIS — H5213 Myopia, bilateral: Secondary | ICD-10-CM | POA: Diagnosis not present

## 2021-04-04 DIAGNOSIS — Z961 Presence of intraocular lens: Secondary | ICD-10-CM | POA: Diagnosis not present

## 2021-04-12 ENCOUNTER — Other Ambulatory Visit (HOSPITAL_COMMUNITY): Payer: Self-pay | Admitting: Cardiology

## 2021-05-26 DIAGNOSIS — M25511 Pain in right shoulder: Secondary | ICD-10-CM | POA: Diagnosis not present

## 2021-05-26 DIAGNOSIS — M7541 Impingement syndrome of right shoulder: Secondary | ICD-10-CM | POA: Diagnosis not present

## 2021-06-13 DIAGNOSIS — Z20828 Contact with and (suspected) exposure to other viral communicable diseases: Secondary | ICD-10-CM | POA: Diagnosis not present

## 2021-06-26 DIAGNOSIS — G9331 Postviral fatigue syndrome: Secondary | ICD-10-CM | POA: Diagnosis not present

## 2021-06-26 DIAGNOSIS — Z79899 Other long term (current) drug therapy: Secondary | ICD-10-CM | POA: Diagnosis not present

## 2021-06-26 DIAGNOSIS — J392 Other diseases of pharynx: Secondary | ICD-10-CM | POA: Diagnosis not present

## 2021-06-26 DIAGNOSIS — K219 Gastro-esophageal reflux disease without esophagitis: Secondary | ICD-10-CM | POA: Diagnosis not present

## 2021-06-26 DIAGNOSIS — R0981 Nasal congestion: Secondary | ICD-10-CM | POA: Diagnosis not present

## 2021-06-29 DIAGNOSIS — K219 Gastro-esophageal reflux disease without esophagitis: Secondary | ICD-10-CM | POA: Diagnosis not present

## 2021-07-11 ENCOUNTER — Other Ambulatory Visit: Payer: Self-pay | Admitting: Neurology

## 2021-07-11 NOTE — Telephone Encounter (Signed)
No further refill unitl seen by Dr. Tomi Likens next appt is 08/02/2021

## 2021-07-19 DIAGNOSIS — Z23 Encounter for immunization: Secondary | ICD-10-CM | POA: Diagnosis not present

## 2021-08-01 DIAGNOSIS — Z981 Arthrodesis status: Secondary | ICD-10-CM | POA: Diagnosis not present

## 2021-08-01 DIAGNOSIS — M47817 Spondylosis without myelopathy or radiculopathy, lumbosacral region: Secondary | ICD-10-CM | POA: Diagnosis not present

## 2021-08-01 NOTE — Progress Notes (Signed)
NEUROLOGY FOLLOW UP OFFICE NOTE  Danny [name] [id number]  Assessment/Plan:   Idiopathic polyneuropathy History of ischemic left sixth nerve palsy History of multiple lumbar spine surgeries  1  Change and increase gabapentin to 667m at 4PM and 6040mat bedtime.  2  Follow up 9 months  Subjective:  [name] an [age]ear old right-handed White male with chronic hepatitis, cervical disc disease, lumbar spine disease s/p surgery and CAD who follows up for polyneuropathy.   UPDATE: Current medication:  gabapentin 30062mhree times daily (4 PM, 8 PM, 12 AM  He feels good in the morning.  By end of day (around 6 PM) and he sits, his feet start tingling and feel cold.  Doesn't bother him if he is standing.  Uses a blanket or heating pad on feet which helps.     HISTORY: In 2008, he began experiencing left upper extremity numbness and tingling following rotator cuff surgery.  It involves only the hand and is fairly constant.  No pain or weakness.  He is still able to perform daily activities such as chores around the house or playing golf.  EMG in 2014 reportedly negative.  [facility].  NCV-EMG of left upper extremity was reportedly normal.  MRI of cervical spine showed mild degenerative disc disease but no significant central or foraminal stenosis.  MRI of brain showed mild age-related changes but overall unremarkable.  No specific diagnosis was determined.  It has been unchanged over the years.  Repeat NCV-EMG of left upper extremity on 11/25/2019 was normal.     For many years, he has had numbness and tingling in the feet as well as intermittent muscle cramps in the calves.  He has changed from Lipitor to Crestor with no improvement.  He typically treats with mustard and apple cider vinegar.  At night, he notes burning and stinging pain in the feet.  Bood work in 2014 showed negative ANA, negative RF, CRP 1.3, Hgb A1c 6.3, CK 116,  elevated TSH 5.370, but subsequent TSH has been normal.  He has history of multiple back surgeries.  He had sinal fusion L3-4 in 2010, followed by spinal fusion L2-3 in 2011.  To evaluate low back pain with right leg numbness, he underwent MRI of lumbar spine on 03/26/2015 which showed interbody fusion at L2-3 and L3-4 without significant stenosis and disc degeneration and spurring causing right foraminal encroachment at L4-5 and L5-S1.   He underwent extra-foraminal microdiskectomy at L4-5 and L5-S1 in January 2017 but then developed left lower back pain into the hip.  Myelogram on 07/12/2016 showed solid fusion at L2-3 and L3-4 as well as multilevel subarticular and foraminal stenosis.  NCV-EMG of the lower extremities from 11/30/2019 showed moderate to severe mixed axonal and demyelinating sensorimotor polyneuropathy but no evidence of lumbar radiculopathy, plexopathy or myopathy.  Labs from May 2021 include B12 459, folate >23.7, Mg 2.2, TSH 2.17, free T4 0.83, and SPEP/IFE negative for monoclonal protein.    In June 2021, he developed double vision.  Double vision was only noticeable unless he looks to the left greater than right.  He reports history of 6th nerve palsy 20 years ago and thought it was a recurrence.  He saw ophthalmology who diagnosed left 6th nerve palsy .  He also had a left sided headache above the left eye, which was previously found to be sinusitis. He saw ENT for possible sinus infection, which was ruled  out.  MRI of brain with and without contrast on 05/05/2020 showed chronic small vessel ischemic changes in the cerebral white matter but no brainstem or cavernous sinus abnormalities.  Labs included sed rate 9, CRP 0.8.and Ace 11.  Diplopia subsequently resolved.  Dull left frontal headache about 3 times a week.  It happens when he gets up in the morning and is resolved by afternoon.  The headaches are chronic, for many years.   Past medication: Lyrica   Imaging: 05/20/2013 MRI CERVICAL  SPINE WO:  Mildly abnormal MRI scan of the cervical spine showing exaggerated forward lordotic curvature and minor disc degenerative changes without significant compression. 06/18/2013 MRI BRAIN W WO:  Abnormal MRI scan of the brain showing mild changes of chronic microvascular ischemia and generalized cerebral atrophy. 03/26/2015 MRI LUMBAR SPINE WO:  interbody fusion at L2-3 and L3-4 without significant stenosis.  Lumbar dextroscoliosis.  Disc degeneration and spurring causing right foraminal encroachment.  L4-5 and L5-S1, similar to 2011. 07/12/2016 CT MYELOGRAM LUMBAR SPINE:  1. Solid fusion at L2-3 and L3-4.  2. Progressive adjacent level disease at L1-2 with progressive moderate left and mild right subarticular stenosis.  3. Moderate left foraminal narrowing at L1-2.  4. Moderate left subarticular and mild left foraminal stenosis at L2-3 without significant interval change.  5. Moderate left subarticular and mild left foraminal narrowing at L3-4 is stable. This is predominantly due to facet disease.  6. Mild subarticular and foraminal narrowing bilaterally at L4-5.  7. Mild subarticular narrowing at L5-S1 is worse on the right.  8. Moderate right and mild left foraminal narrowing at L5-S1 is stable. 05/05/2020 MRI BRAIN W WO:  No explanation for 6 nerve palsy. Brainstem and cavernous sinus normal. No orbital mass.  No acute abnormality. Progression of chronic microvascular ischemic change in the white matter compared with MRI of 06/18/2013  PAST MEDICAL HISTORY: Past Medical History:  Diagnosis Date   Allergic rhinitis    Arthritis    BPH (benign prostatic hypertrophy)    Dr. Alinda Money   Cervical disc disease    Coronary artery disease    Patient developed exertional dyspnea. ETT-myoview (8/12) with 11 METS, normal LV EF, normal perfusion at rest and stress but elevated TID ratio. Coronary CT angiogram in 9/12 showed equivocal significant disease in the CFX. Cath in 10/12 showed EF 55-60%, 30%  mid RCA, 40% distal RCA, 40% proximal LAD. ETT-Myoview (10/14): Diaphragmatic attenuation, no ischemia, EF 62%, normal study   Detached vitreous humor    Diverticulosis    Diverticulosis    GERD (gastroesophageal reflux disease)    Heart murmur    Hemorrhoids    Impaired fasting glucose    Ingrown toenail    Lumbar radiculopathy    Numbness and tingling in left arm    Onychomycosis    Rotator cuff disorder    Sixth nerve palsy 2001    MEDICATIONS: Current Outpatient Medications on File Prior to Visit  Medication Sig Dispense Refill   acetaminophen (TYLENOL) 500 MG tablet Take 500 mg by mouth every 6 (six) hours as needed.     aspirin 81 MG tablet Take 81 mg by mouth daily.     betamethasone dipropionate 0.05 % cream Apply 0.5 application topically as needed.     Calcium Citrate-Vitamin D (CITRACAL + D PO) Take 1 tablet by mouth daily.     Cholecalciferol 50 MCG (2000 UT) CAPS Take 1,000 Units by mouth in the morning and at bedtime.     Coenzyme  Q10 (CO Q 10 PO) Take 400 mg by mouth daily.     docusate sodium (COLACE) 100 MG capsule Take 100 mg by mouth 2 (two) times daily.     esomeprazole (NEXIUM) 20 MG capsule Take 20 mg by mouth daily at 12 noon.     esomeprazole (NEXIUM) 20 MG packet Take 20 mg by mouth daily before breakfast. (Patient not taking: Reported on 11/02/2020)     gabapentin (NEURONTIN) 300 MG capsule TAKE (1) CAPSULE THREE TIMES DAILY. 69 capsule 0   Magnesium Oxide 400 MG CAPS Take 1 capsule (400 mg total) by mouth daily. 30 capsule 11   Multiple Vitamin (MULTIVITAMIN) tablet Take 1 tablet by mouth daily.     Multiple Vitamins-Minerals (PRESERVISION AREDS 2) CAPS 1 capsule     nitroGLYCERIN (NITROSTAT) 0.4 MG SL tablet ONE TABLET UNDER TONGUE WHEN NEEDED FOR CHEST PAIN. MAY REPEAT IN 5 MINUTES. 25 tablet 6   Probiotic Product (PROBIOTIC-10 PO) 1 capsule     psyllium (METAMUCIL) 58.6 % packet Take by mouth.     RESTASIS 0.05 % ophthalmic emulsion Place 1 drop into  both eyes 2 (two) times daily.      rosuvastatin (CRESTOR) 5 MG tablet TAKE 1 TABLET ONCE DAILY. 90 tablet 0   Saw Palmetto, Serenoa repens, (SAW PALMETTO PO) Take 1 tablet by mouth 2 (two) times daily.     tamsulosin (FLOMAX) 0.4 MG CAPS capsule Take 0.4 mg by mouth.     traMADol (ULTRAM) 50 MG tablet Take 50 mg 2 (two) times daily by mouth.     traZODone (DESYREL) 50 MG tablet Take 25 mg by mouth at bedtime as needed.     Turmeric 500 MG CAPS Take 1,000 mg by mouth.     zolpidem (AMBIEN) 10 MG tablet Take 10 mg by mouth at bedtime as needed for sleep.     No current facility-administered medications on file prior to visit.    ALLERGIES: No Known Allergies  FAMILY HISTORY: Family History  Problem Relation Age of Onset   Cancer Mother        multiple myeloma   Hypertension Mother    Stroke Father    Heart attack Neg Hx       Objective:  Blood pressure 114/75, pulse 78, height '5\' 10"'  (1.778 m), weight 164 lb (74.4 kg), SpO2 97 %. General: No acute distress.  Patient appears well-groomed.   Head:  Normocephalic/atraumatic Eyes:  Fundi examined but not visualized Neck: supple, no paraspinal tenderness, full range of motion Heart:  Regular rate and rhythm Lungs:  Clear to auscultation bilaterally Back: No paraspinal tenderness Neurological Exam: alert and oriented to person, place, and time.  Speech fluent and not dysarthric, language intact.  CN II-XII intact. Bulk and tone normal, muscle strength 5/5 throughout.  Sensation to light touch intact.  Deep tendon reflexes 2+ throughout, toes downgoing.  Finger to nose testing intact.  Gait normal, Romberg negative.   Metta Clines, DO  CC: Lajean Manes, MD

## 2021-08-02 ENCOUNTER — Encounter: Payer: Self-pay | Admitting: Neurology

## 2021-08-02 ENCOUNTER — Ambulatory Visit (INDEPENDENT_AMBULATORY_CARE_PROVIDER_SITE_OTHER): Payer: Medicare Other | Admitting: Neurology

## 2021-08-02 ENCOUNTER — Other Ambulatory Visit: Payer: Self-pay

## 2021-08-02 VITALS — BP 114/75 | HR 78 | Ht 70.0 in | Wt 164.0 lb

## 2021-08-02 DIAGNOSIS — Z9889 Other specified postprocedural states: Secondary | ICD-10-CM | POA: Diagnosis not present

## 2021-08-02 DIAGNOSIS — G6289 Other specified polyneuropathies: Secondary | ICD-10-CM | POA: Diagnosis not present

## 2021-08-02 MED ORDER — GABAPENTIN 600 MG PO TABS: ORAL_TABLET | ORAL | 5 refills | Status: DC

## 2021-08-02 NOTE — Patient Instructions (Signed)
Take gabapentin 600mg  at 4 PM and 600mg  at bedtime.  If no improvement in 4 weeks, contact me Follow up 9 months.

## 2021-08-15 ENCOUNTER — Encounter (HOSPITAL_COMMUNITY): Payer: Self-pay | Admitting: Cardiology

## 2021-08-15 ENCOUNTER — Other Ambulatory Visit: Payer: Self-pay

## 2021-08-15 ENCOUNTER — Ambulatory Visit (HOSPITAL_COMMUNITY)
Admission: RE | Admit: 2021-08-15 | Discharge: 2021-08-15 | Disposition: A | Discharge status: Home / Self Care | Payer: Medicare Other | Source: Ambulatory Visit | Attending: Cardiology | Admitting: Cardiology

## 2021-08-15 VITALS — BP 134/80 | HR 72 | Wt 167.8 lb

## 2021-08-15 DIAGNOSIS — I350 Nonrheumatic aortic (valve) stenosis: Secondary | ICD-10-CM | POA: Diagnosis not present

## 2021-08-15 DIAGNOSIS — Z79899 Other long term (current) drug therapy: Secondary | ICD-10-CM | POA: Insufficient documentation

## 2021-08-15 DIAGNOSIS — G629 Polyneuropathy, unspecified: Secondary | ICD-10-CM | POA: Diagnosis not present

## 2021-08-15 DIAGNOSIS — I7121 Aneurysm of the ascending aorta, without rupture: Secondary | ICD-10-CM | POA: Diagnosis not present

## 2021-08-15 DIAGNOSIS — Z8616 Personal history of COVID-19: Secondary | ICD-10-CM | POA: Diagnosis not present

## 2021-08-15 DIAGNOSIS — I2510 Atherosclerotic heart disease of native coronary artery without angina pectoris: Secondary | ICD-10-CM | POA: Diagnosis not present

## 2021-08-15 DIAGNOSIS — R0609 Other forms of dyspnea: Secondary | ICD-10-CM | POA: Insufficient documentation

## 2021-08-15 DIAGNOSIS — E785 Hyperlipidemia, unspecified: Secondary | ICD-10-CM | POA: Diagnosis not present

## 2021-08-15 DIAGNOSIS — I5032 Chronic diastolic (congestive) heart failure: Secondary | ICD-10-CM

## 2021-08-15 LAB — BASIC METABOLIC PANEL
Anion gap: 6 (ref 5–15)
BUN: 16 mg/dL (ref 8–23)
CO2: 26 mmol/L (ref 22–32)
Calcium: 9.6 mg/dL (ref 8.9–10.3)
Chloride: 105 mmol/L (ref 98–111)
Creatinine, Ser: 0.84 mg/dL (ref 0.61–1.24)
GFR, Estimated: >60 mL/min (ref >60)
Glucose, Bld: 106 mg/dL — ABNORMAL HIGH (ref 70–99)
Potassium: 4.1 mmol/L (ref 3.5–5.1)
Sodium: 137 mmol/L (ref 135–145)

## 2021-08-15 LAB — CBC
HCT: 40.2 % (ref 39.0–52.0)
Hemoglobin: 13.5 g/dL (ref 13.0–17.0)
MCH: 30.6 pg (ref 26.0–34.0)
MCHC: 33.6 g/dL (ref 30.0–36.0)
MCV: 91.2 fL (ref 80.0–100.0)
Platelets: 197 10*3/uL (ref 150–400)
RBC: 4.41 MIL/uL (ref 4.22–5.81)
RDW: 13.1 % (ref 11.5–15.5)
WBC: 6.3 10*3/uL (ref 4.0–10.5)
nRBC: 0 % (ref 0.0–0.2)

## 2021-08-15 LAB — LIPID PANEL
Cholesterol: 114 mg/dL (ref 0–200)
HDL: 60 mg/dL (ref >40)
LDL Cholesterol: 45 mg/dL (ref 0–99)
Total CHOL/HDL Ratio: 1.9 RATIO
Triglycerides: 43 mg/dL (ref <150)
VLDL: 9 mg/dL (ref 0–40)

## 2021-08-15 LAB — TSH: TSH: 1.968 u[IU]/mL (ref 0.350–4.500)

## 2021-08-15 NOTE — Patient Instructions (Addendum)
EKG done today.  Labs done today. We will contact you only if your labs are abnormal.  No medication changes were made. Please continue all current medications as prescribed.  Your physician has requested that you have en exercise stress myoview. Someone will contact you to schedule an appointment. This will be done at [facility] Address:[address] [place] [address]   Your physician recommends that you schedule a follow-up appointment in: 1 year. Please contact our office in October 2023 to schedule a November 2023 appointment.   If you have any questions or concerns before your next appointment please send [place] a message through [place] or call our office at [phone].    TO LEAVE A MESSAGE FOR THE NURSE SELECT OPTION 2, PLEASE LEAVE A MESSAGE INCLUDING: YOUR NAME DATE OF BIRTH CALL BACK NUMBER REASON FOR CALL**this is important as we prioritize the call backs  YOU WILL RECEIVE A CALL BACK THE SAME DAY AS LONG AS YOU CALL BEFORE 4:00 PM   Do the following things EVERYDAY: Weigh yourself in the morning before breakfast. Write it down and keep it in a log. Take your medicines as prescribed Eat low salt foods--Limit salt (sodium) to 2000 mg per day.  Stay as active as you can everyday Limit all fluids for the day to less than 2 liters   At the [facility], you and your health needs are our priority. As part of our continuing mission to provide you with exceptional heart care, we have created designated Provider Care Teams. These Care Teams include your primary Cardiologist (physician) and Advanced Practice Providers (APPs- Physician Assistants and Nurse Practitioners) who all work together to provide you with the care you need, when you need it.   You may see any of the following providers on your designated Care Team at your next follow up: Dr Glori Bickers Dr Haynes Kerns, NP Lyda Jester, [place] Audry Riles, PharmD   Please be  sure to bring in all your medications bottles to every appointment.

## 2021-08-15 NOTE — Progress Notes (Signed)
Patient ID: Danny [name], male   DOB: 1936/07/26, 85 y.o.   MRN: [id number] PCP: Dr. Felipa Eth Cardiology: Dr. Aundra Dubin  85 y.o. yo with history of nonobstructive CAD presents for followup of CAD and aortic stenosis.  He last had an ETT-Cardiolite in 6/16 that was normal.  Echo in 9/17 showed EF 55-60%, mild aortic stenosis, 4.0 cm ascending aorta.  MRA chest in 12/18 showed tortuous thoracic aorta without aneurysm. Echo in 11/19 showed EF 60-65%, mild AS/mild AI.  Echo in 12/21 showed EF 60-65% with mild AI, mild AS.  PYP scan in 12/21 was grade 1 with H/CL 1.15, probably negative for TTR cardiac amyloidosis.   Patient reports increased exertional dyspnea over the last couple of months.  He is short of breath walking up a hill or walking up stairs.  He continues to golf regularly but notes dyspnea when he has to walk longer distances on the course.  No chest pain.  Peripheral neuropathy symptoms are better with gabapentin. Of note, he had COVID-19 in 9/22 and was quite symptomatic though not hospitalized.  He was in bed for about a week.     ECG (personally reviewed): NSR, 1st degree AVB  Labs (8/14): K 3.9, creatinine 0.89 Labs (12/14): LDL 62, HDL 48   Labs (6/16): LDL 56 Labs (1/17): creatinine 0.88, HCT 43.9 Labs (8/17): K 4.3, creatinine 0.88, LFTs normal Labs (9/17): CK normal, Mg normal Labs (11/17): LDL 32, HDL 52 Labs (11/18): LDL 50 Labs (11/19): LDL 57, HDL 53, K 4, creatinine 0.82 Labs (3/21): LDL 53 Labs (4/21): K 3.8, creatinine 0.83, myeloma panel negative, urine immunofixation negative, LDL 68  PMH: 1. Rotator cuff surgery 2. Diverticulosis 3. Chronic persistent hepatitis 4. Impaired fasting glucose 5. 6th nerve palsy in 2001 6. Allergic rhinitis 7. Low back pain s/p several operations.  Most recently had microdiscectomy in 1/17.  8. CAD: Patient developed exertional dyspnea.  ETT-myoview (8/12) with 11 METS, normal LV EF, normal perfusion at rest and stress but  elevated TID ratio.  Coronary CT angiogram in 9/12 showed equivocal significant disease in the CFX.  Cath in 10/12 showed EF 55-60%, 30% mid RCA, 40% distal RCA, 40% proximal LAD.  ETT-Cardiolite in 10/14 showed EF 62%, probably normal study with diaphragmatic attenuation.   Echo (4/15) with EF 55-60%, mild MR.  - Cardiolite (6/16) with no ischemia/infarction.  - Echo (9/17) with EF 55-60%, mild aortic stenosis, 4.0 cm ascending aorta.  - Echo (11/19): EF 60-65%, mild AS mean gradient 9 mmHg with AVA 1.53 cm^2, mild AI.  - Echo (12/21): EF 60-65%, mild AS, mild AI.  9. GERD 10. Hyperlipidemia: Myalgias with atorvastatin.  11. Aortic stenosis: Mild on 9/17 echo. Mild on 11/19 echo. Mild on 12/21 echo. 12. Ascending aorta dilation: MRA chest (12/18) showed a tortuous ascending aorta without aneurysmal dilation.  13. Peripheral neuropathy of uncertain etiology.  14. PYP scan (12/21): grade 1, H/CL 1.15 (probably negative)  FH: Father with CVA at 51  SH: Married, never smoked, rare ETOH.   ROS: All systems reviewed and negative except as per HPI.   Current Outpatient Medications  Medication Sig Dispense Refill   acetaminophen (TYLENOL) 500 MG tablet Take 500 mg by mouth every 6 (six) hours as needed.     aspirin 81 MG tablet Take 81 mg by mouth daily.     betamethasone dipropionate 0.05 % cream Apply 0.5 application topically as needed.     Calcium Citrate-Vitamin D (CITRACAL + D PO) Take  1 tablet by mouth daily.     Cholecalciferol 50 MCG (2000 UT) CAPS Take 1,000 Units by mouth in the morning and at bedtime.     Coenzyme Q10 (CO Q 10 PO) Take 400 mg by mouth daily.     docusate sodium (COLACE) 100 MG capsule Take 100 mg by mouth 2 (two) times daily.     esomeprazole (NEXIUM) 20 MG capsule Take 20 mg by mouth daily at 12 noon.     finasteride (PROSCAR) 5 MG tablet Take 5 mg by mouth daily.     gabapentin (NEURONTIN) 600 MG tablet Take 1 tablet at 4 PM and 1 tablet at bedtime 60 tablet 5    Magnesium Oxide 400 MG CAPS Take 1 capsule (400 mg total) by mouth daily. 30 capsule 11   Multiple Vitamin (MULTIVITAMIN) tablet Take 1 tablet by mouth daily.     Multiple Vitamins-Minerals (PRESERVISION AREDS 2) CAPS 1 capsule     nitroGLYCERIN (NITROSTAT) 0.4 MG SL tablet ONE TABLET UNDER TONGUE WHEN NEEDED FOR CHEST PAIN. MAY REPEAT IN 5 MINUTES. 25 tablet 6   Probiotic Product (PROBIOTIC-10 PO) 1 capsule     psyllium (METAMUCIL) 58.6 % packet Take by mouth.     RESTASIS 0.05 % ophthalmic emulsion Place 1 drop into both eyes 2 (two) times daily.      rosuvastatin (CRESTOR) 5 MG tablet TAKE 1 TABLET ONCE DAILY. 90 tablet 0   Saw Palmetto 160 MG CAPS 1 capsule     tamsulosin (FLOMAX) 0.4 MG CAPS capsule Take 0.4 mg by mouth.     traMADol (ULTRAM) 50 MG tablet Take 50 mg by mouth as needed.     traZODone (DESYREL) 50 MG tablet Take 25 mg by mouth at bedtime as needed.     Turmeric 500 MG CAPS Take 1,000 mg by mouth.     zolpidem (AMBIEN) 10 MG tablet Take 10 mg by mouth at bedtime as needed for sleep.     No current facility-administered medications for this encounter.    BP 134/80   Pulse 72   Wt 76.1 kg (167 lb 12.8 oz)   SpO2 96%   BMI 24.08 kg/m  General: NAD Neck: No JVD, no thyromegaly or thyroid nodule.  Lungs: Clear to auscultation bilaterally with normal respiratory effort. CV: Nondisplaced PMI.  Heart regular S1/S2, no S3/S4, 2/6 SEM with clear S2.  No peripheral edema.  No carotid bruit.  Normal pedal pulses.  Abdomen: Soft, nontender, no hepatosplenomegaly, no distention.  Skin: Intact without lesions or rashes.  Neurologic: Alert and oriented x 3.  Psych: Normal affect. Extremities: No clubbing or cyanosis.  HEENT: Normal.   Assessment/Plan:  1. CAD: Nonobstructive on prior cath.  Low risk Cardiolite in 6/16. No chest pain but recently has had increased exertional dyspnea.  This may be due to a drop in stamina after he was bed-bound for a week with COVID-19, however I  think that we need to rule out ischemia. Echo < 1 year ago showed normal EF and mild AS.  His murmur is not suggestive of severe AS.  - Continue ASA 81.  - Continue Crestor.  - I will arrange for ETT-Cardiolite for risk stratification.  2. Hyperlipidemia: Goal LDL < 70 with known CAD.  Myalgias with atorvastatin, now tolerating Crestor.   - Check lipids today.  3. Aortic stenosis: Mild on 12/21 echo.  Murmur does not sound severe.   4. Ascending aortic aneurysm: Mild, 4.0 cm on 9/17 echo. MRA  chest in 12/18 showed tortuous aorta without frank aneurysmal dilation.   5. Peripheral neuropathy: polyneuropathy of uncertain etiology.  Also with history of aortic stenosis and LVH on last echo. Transthyretin amyloidosis is a concern with this clinical presentation, but PYP scan was not suggestive of TTR cardiac amyloidosis. Myeloma workup also was negative.   Followup 1 year Cardiolite is normal.   Loralie Champagne 08/15/2021

## 2021-08-29 DIAGNOSIS — M47817 Spondylosis without myelopathy or radiculopathy, lumbosacral region: Secondary | ICD-10-CM | POA: Diagnosis not present

## 2021-08-31 NOTE — Addendum Note (Signed)
Encounter addended by: Larey Dresser, MD on: 08/31/2021 4:56 PM  Actions taken: Pharmacy for encounter modified, Order list changed

## 2021-08-31 NOTE — Addendum Note (Signed)
Encounter addended by: Scarlette Calico, RN on: 08/31/2021 4:41 PM  Actions taken: Order list changed, Diagnosis association updated

## 2021-09-13 ENCOUNTER — Telehealth (HOSPITAL_COMMUNITY): Payer: Self-pay | Admitting: *Deleted

## 2021-09-13 NOTE — Telephone Encounter (Signed)

## 2021-09-20 ENCOUNTER — Ambulatory Visit (HOSPITAL_COMMUNITY): Payer: Medicare Other | Attending: Internal Medicine

## 2021-09-20 ENCOUNTER — Other Ambulatory Visit: Payer: Self-pay

## 2021-09-20 DIAGNOSIS — R0609 Other forms of dyspnea: Secondary | ICD-10-CM | POA: Diagnosis not present

## 2021-09-20 LAB — MYOCARDIAL PERFUSION IMAGING
Angina Index: 0
Duke Treadmill Score: 6
Estimated workload: 7
Exercise duration (min): 6 min
Exercise duration (sec): 0 s
LV dias vol: 76 mL (ref 62–150)
LV sys vol: 24 mL
MPHR: 137 {beats}/min
Nuc Stress EF: 68 %
Peak HR: 120 {beats}/min
Percent HR: 87 %
Rest HR: 61 {beats}/min
Rest Nuclear Isotope Dose: 10.3 mCi
SDS: 0
SRS: 0
SSS: 0
ST Depression (mm): 0 mm
Stress Nuclear Isotope Dose: 30.7 mCi
TID: 0.91

## 2021-09-20 MED ORDER — TECHNETIUM TC 99M TETROFOSMIN IV KIT
30.7000 | PACK | Freq: Once | INTRAVENOUS | Status: AC | PRN
  Administered 2021-09-20: 30.7 via INTRAVENOUS
  Filled 2021-09-20: qty 31

## 2021-09-20 MED ORDER — TECHNETIUM TC 99M TETROFOSMIN IV KIT
10.3000 | PACK | Freq: Once | INTRAVENOUS | Status: AC | PRN
  Administered 2021-09-20: 10.3 via INTRAVENOUS
  Filled 2021-09-20: qty 11

## 2021-10-03 DIAGNOSIS — Z981 Arthrodesis status: Secondary | ICD-10-CM | POA: Diagnosis not present

## 2021-10-03 DIAGNOSIS — M47817 Spondylosis without myelopathy or radiculopathy, lumbosacral region: Secondary | ICD-10-CM | POA: Diagnosis not present

## 2021-10-05 DIAGNOSIS — M26629 Arthralgia of temporomandibular joint, unspecified side: Secondary | ICD-10-CM | POA: Diagnosis not present

## 2021-12-05 DIAGNOSIS — D225 Melanocytic nevi of trunk: Secondary | ICD-10-CM | POA: Diagnosis not present

## 2021-12-05 DIAGNOSIS — L570 Actinic keratosis: Secondary | ICD-10-CM | POA: Diagnosis not present

## 2021-12-05 DIAGNOSIS — L821 Other seborrheic keratosis: Secondary | ICD-10-CM | POA: Diagnosis not present

## 2021-12-05 DIAGNOSIS — Z85820 Personal history of malignant melanoma of skin: Secondary | ICD-10-CM | POA: Diagnosis not present

## 2021-12-05 DIAGNOSIS — Z08 Encounter for follow-up examination after completed treatment for malignant neoplasm: Secondary | ICD-10-CM | POA: Diagnosis not present

## 2021-12-05 DIAGNOSIS — L814 Other melanin hyperpigmentation: Secondary | ICD-10-CM | POA: Diagnosis not present

## 2021-12-20 DIAGNOSIS — G629 Polyneuropathy, unspecified: Secondary | ICD-10-CM | POA: Diagnosis not present

## 2021-12-20 DIAGNOSIS — R1013 Epigastric pain: Secondary | ICD-10-CM | POA: Diagnosis not present

## 2021-12-20 DIAGNOSIS — E7800 Pure hypercholesterolemia, unspecified: Secondary | ICD-10-CM | POA: Diagnosis not present

## 2021-12-20 DIAGNOSIS — K219 Gastro-esophageal reflux disease without esophagitis: Secondary | ICD-10-CM | POA: Diagnosis not present

## 2021-12-20 DIAGNOSIS — Z79899 Other long term (current) drug therapy: Secondary | ICD-10-CM | POA: Diagnosis not present

## 2021-12-20 DIAGNOSIS — H903 Sensorineural hearing loss, bilateral: Secondary | ICD-10-CM | POA: Diagnosis not present

## 2021-12-20 DIAGNOSIS — Z1389 Encounter for screening for other disorder: Secondary | ICD-10-CM | POA: Diagnosis not present

## 2021-12-20 DIAGNOSIS — Z0000 Encounter for general adult medical examination without abnormal findings: Secondary | ICD-10-CM | POA: Diagnosis not present

## 2021-12-20 DIAGNOSIS — K9089 Other intestinal malabsorption: Secondary | ICD-10-CM | POA: Diagnosis not present

## 2021-12-20 DIAGNOSIS — R350 Frequency of micturition: Secondary | ICD-10-CM | POA: Diagnosis not present

## 2021-12-20 DIAGNOSIS — D692 Other nonthrombocytopenic purpura: Secondary | ICD-10-CM | POA: Diagnosis not present

## 2021-12-20 DIAGNOSIS — I350 Nonrheumatic aortic (valve) stenosis: Secondary | ICD-10-CM | POA: Diagnosis not present

## 2022-01-10 DIAGNOSIS — H903 Sensorineural hearing loss, bilateral: Secondary | ICD-10-CM | POA: Diagnosis not present

## 2022-03-05 DIAGNOSIS — M47817 Spondylosis without myelopathy or radiculopathy, lumbosacral region: Secondary | ICD-10-CM | POA: Diagnosis not present

## 2022-03-15 ENCOUNTER — Other Ambulatory Visit: Payer: Self-pay | Admitting: Neurology

## 2022-04-03 DIAGNOSIS — M47817 Spondylosis without myelopathy or radiculopathy, lumbosacral region: Secondary | ICD-10-CM | POA: Diagnosis not present

## 2022-04-10 ENCOUNTER — Other Ambulatory Visit: Payer: Self-pay | Admitting: Gastroenterology

## 2022-04-10 DIAGNOSIS — Z86010 Personal history of colonic polyps: Secondary | ICD-10-CM | POA: Diagnosis not present

## 2022-04-10 DIAGNOSIS — K219 Gastro-esophageal reflux disease without esophagitis: Secondary | ICD-10-CM

## 2022-04-17 DIAGNOSIS — H52203 Unspecified astigmatism, bilateral: Secondary | ICD-10-CM | POA: Diagnosis not present

## 2022-04-17 DIAGNOSIS — M25531 Pain in right wrist: Secondary | ICD-10-CM | POA: Diagnosis not present

## 2022-04-17 DIAGNOSIS — Z961 Presence of intraocular lens: Secondary | ICD-10-CM | POA: Diagnosis not present

## 2022-04-17 DIAGNOSIS — H04123 Dry eye syndrome of bilateral lacrimal glands: Secondary | ICD-10-CM | POA: Diagnosis not present

## 2022-04-18 ENCOUNTER — Ambulatory Visit
Admission: RE | Admit: 2022-04-18 | Discharge: 2022-04-18 | Disposition: A | Discharge status: Home / Self Care | Payer: Medicare Other | Source: Ambulatory Visit | Attending: Gastroenterology | Admitting: Gastroenterology

## 2022-04-18 DIAGNOSIS — K219 Gastro-esophageal reflux disease without esophagitis: Secondary | ICD-10-CM | POA: Diagnosis not present

## 2022-04-18 DIAGNOSIS — K449 Diaphragmatic hernia without obstruction or gangrene: Secondary | ICD-10-CM | POA: Diagnosis not present

## 2022-04-18 DIAGNOSIS — K224 Dyskinesia of esophagus: Secondary | ICD-10-CM | POA: Diagnosis not present

## 2022-05-01 NOTE — Progress Notes (Unsigned)
NEUROLOGY FOLLOW UP OFFICE NOTE  Danny [name] [id number]  Assessment/Plan:   Idiopathic polyneuropathy Lumbar spondylosis with history of multiple lumbar spine surgeries   1  Gabapentin to 684m at 4PM and 6068mat bedtime.  2  Follow up 9 months   Subjective:  Danny [name] an [age]ear old right-handed White male with chronic hepatitis, cervical disc disease, lumbar spine disease s/p multiple surgeries,CAD and history of ischemic left sixth nerve palsy who follows up for polyneuropathy.   UPDATE: Current medication:  gabapentin 60010mt 4PM and 600m65m bedtime.   In November we increased gabapentin.  ***   HISTORY: In 2008, he began experiencing left upper extremity numbness and tingling following rotator cuff surgery.  It involves only the hand and is fairly constant.  No pain or weakness.  He is still able to perform daily activities such as chores around the house or playing golf.  EMG in 2014 reportedly negative.  [name].  NCV-EMG of left upper extremity was reportedly normal.  MRI of cervical spine showed mild degenerative disc disease but no significant central or foraminal stenosis.  MRI of brain showed mild age-related changes but overall unremarkable.  No specific diagnosis was determined.  It has been unchanged over the years.  Repeat NCV-EMG of left upper extremity on 11/25/2019 was normal.     For many years, he has had numbness and tingling in the feet as well as intermittent muscle cramps in the calves.  He has changed from Lipitor to Crestor with no improvement.  He typically treats with mustard and apple cider vinegar.  At night, he notes burning and stinging pain in the feet.  Bood work in 2014 showed negative ANA, negative RF, CRP 1.3, Hgb A1c 6.3, CK 116, elevated TSH 5.370, but subsequent TSH has been normal.  He has history of multiple back surgeries.  He had sinal fusion L3-4 in 2010, followed by spinal  fusion L2-3 in 2011.  To evaluate low back pain with right leg numbness, he underwent MRI of lumbar spine on 03/26/2015 which showed interbody fusion at L2-3 and L3-4 without significant stenosis and disc degeneration and spurring causing right foraminal encroachment at L4-5 and L5-S1.   He underwent extra-foraminal microdiskectomy at L4-5 and L5-S1 in January 2017 but then developed left lower back pain into the hip.  Myelogram on 07/12/2016 showed solid fusion at L2-3 and L3-4 as well as multilevel subarticular and foraminal stenosis.  NCV-EMG of the lower extremities from 11/30/2019 showed moderate to severe mixed axonal and demyelinating sensorimotor polyneuropathy but no evidence of lumbar radiculopathy, plexopathy or myopathy.  Labs from May 2021 include B12 459, folate >23.7, Mg 2.2, TSH 2.17, free T4 0.83, and SPEP/IFE negative for monoclonal protein.    In June 2021, he developed double vision.  Double vision was only noticeable unless he looks to the left greater than right.  He reports history of 6th nerve palsy 20 years ago and thought it was a recurrence.  He saw ophthalmology who diagnosed left 6th nerve palsy .  He also had a left sided headache above the left eye, which was previously found to be sinusitis. He saw ENT for possible sinus infection, which was ruled out.  MRI of brain with and without contrast on 05/05/2020 showed chronic small vessel ischemic changes in the cerebral white matter but no brainstem or cavernous sinus abnormalities.  Labs included sed rate 9, CRP 0.8.and Ace  11.  Diplopia subsequently resolved.  Dull left frontal headache about 3 times a week.  It happens when he gets up in the morning and is resolved by afternoon.  The headaches are chronic, for many years.   Past medication: Lyrica   Imaging: 05/20/2013 MRI CERVICAL SPINE WO:  Mildly abnormal MRI scan of the cervical spine showing exaggerated forward lordotic curvature and minor disc degenerative changes without  significant compression. 06/18/2013 MRI BRAIN W WO:  Abnormal MRI scan of the brain showing mild changes of chronic microvascular ischemia and generalized cerebral atrophy. 03/26/2015 MRI LUMBAR SPINE WO:  interbody fusion at L2-3 and L3-4 without significant stenosis.  Lumbar dextroscoliosis.  Disc degeneration and spurring causing right foraminal encroachment.  L4-5 and L5-S1, similar to 2011. 07/12/2016 CT MYELOGRAM LUMBAR SPINE:  1. Solid fusion at L2-3 and L3-4.  2. Progressive adjacent level disease at L1-2 with progressive moderate left and mild right subarticular stenosis.  3. Moderate left foraminal narrowing at L1-2.  4. Moderate left subarticular and mild left foraminal stenosis at L2-3 without significant interval change.  5. Moderate left subarticular and mild left foraminal narrowing at L3-4 is stable. This is predominantly due to facet disease.  6. Mild subarticular and foraminal narrowing bilaterally at L4-5.  7. Mild subarticular narrowing at L5-S1 is worse on the right.  8. Moderate right and mild left foraminal narrowing at L5-S1 is stable. 05/05/2020 MRI BRAIN W WO:  No explanation for 6 nerve palsy. Brainstem and cavernous sinus normal. No orbital mass.  No acute abnormality. Progression of chronic microvascular ischemic change in the white matter compared with MRI of 06/18/2013  PAST MEDICAL HISTORY: Past Medical History:  Diagnosis Date   Allergic rhinitis    Arthritis    BPH (benign prostatic hypertrophy)    Dr. Alinda [name]   Cervical disc disease    Coronary artery disease    Patient developed exertional dyspnea. ETT-myoview (8/12) with 11 METS, normal LV EF, normal perfusion at rest and stress but elevated TID ratio. Coronary CT angiogram in 9/12 showed equivocal significant disease in the CFX. Cath in 10/12 showed EF 55-60%, 30% mid RCA, 40% distal RCA, 40% proximal LAD. ETT-Myoview (10/14): Diaphragmatic attenuation, no ischemia, EF 62%, normal study   Detached vitreous humor     Diverticulosis    Diverticulosis    GERD (gastroesophageal reflux disease)    Heart murmur    Hemorrhoids    Impaired fasting glucose    Ingrown toenail    Lumbar radiculopathy    Numbness and tingling in left arm    Onychomycosis    Rotator cuff disorder    Sixth nerve palsy 2001    MEDICATIONS: Current Outpatient Medications on File Prior to Visit  Medication Sig Dispense Refill   acetaminophen (TYLENOL) 500 MG tablet Take 500 mg by mouth every 6 (six) hours as needed.     aspirin 81 MG tablet Take 81 mg by mouth daily.     betamethasone dipropionate 0.05 % cream Apply 0.5 application topically as needed.     Calcium Citrate-Vitamin D (CITRACAL + D PO) Take 1 tablet by mouth daily.     Cholecalciferol 50 MCG (2000 UT) CAPS Take 1,000 Units by mouth in the morning and at bedtime.     Coenzyme Q10 (CO Q 10 PO) Take 400 mg by mouth daily.     docusate sodium (COLACE) 100 MG capsule Take 100 mg by mouth 2 (two) times daily.     esomeprazole (NEXIUM) 20 MG  capsule Take 20 mg by mouth daily at 12 noon.     finasteride (PROSCAR) 5 MG tablet Take 5 mg by mouth daily.     gabapentin (NEURONTIN) 600 MG tablet TAKE 1 TABLET AT 4PM AND TAKE 1 TABLET AT BEDTIME. 60 tablet 1   Magnesium Oxide 400 MG CAPS Take 1 capsule (400 mg total) by mouth daily. 30 capsule 11   Multiple Vitamin (MULTIVITAMIN) tablet Take 1 tablet by mouth daily.     Multiple Vitamins-Minerals (PRESERVISION AREDS 2) CAPS 1 capsule     nitroGLYCERIN (NITROSTAT) 0.4 MG SL tablet ONE TABLET UNDER TONGUE WHEN NEEDED FOR CHEST PAIN. MAY REPEAT IN 5 MINUTES. 25 tablet 6   Probiotic Product (PROBIOTIC-10 PO) 1 capsule     psyllium (METAMUCIL) 58.6 % packet Take by mouth.     RESTASIS 0.05 % ophthalmic emulsion Place 1 drop into both eyes 2 (two) times daily.      rosuvastatin (CRESTOR) 5 MG tablet TAKE 1 TABLET ONCE DAILY. 90 tablet 0   Saw Palmetto 160 MG CAPS 1 capsule     tamsulosin (FLOMAX) 0.4 MG CAPS capsule Take 0.4 mg  by mouth.     traMADol (ULTRAM) 50 MG tablet Take 50 mg by mouth as needed.     traZODone (DESYREL) 50 MG tablet Take 25 mg by mouth at bedtime as needed.     Turmeric 500 MG CAPS Take 1,000 mg by mouth.     zolpidem (AMBIEN) 10 MG tablet Take 10 mg by mouth at bedtime as needed for sleep.     No current facility-administered medications on file prior to visit.    ALLERGIES: No Known Allergies  FAMILY HISTORY: Family History  Problem Relation Age of Onset   Cancer Mother        multiple myeloma   Hypertension Mother    Stroke Father    Heart attack Neg Hx       Objective:  *** General: No acute distress.  Patient appears well-groomed.   Head:  Normocephalic/atraumatic Eyes:  Fundi examined but not visualized Neck: supple, no paraspinal tenderness, full range of motion Heart:  Regular rate and rhythm Back: No paraspinal tenderness Neurological Exam: alert and oriented to person, place, and time.  Speech fluent and not dysarthric, language intact.  CN II-XII intact. Bulk and tone normal, muscle strength 5/5 throughout.  Sensation to light touch intact.  Deep tendon reflexes 2+ throughout, toes downgoing.  Finger to nose testing intact.  ***   Danny Clines, DO  CC: Lajean Manes, MD

## 2022-05-02 ENCOUNTER — Encounter: Payer: Self-pay | Admitting: Neurology

## 2022-05-02 ENCOUNTER — Ambulatory Visit (INDEPENDENT_AMBULATORY_CARE_PROVIDER_SITE_OTHER): Payer: Medicare Other | Admitting: Neurology

## 2022-05-02 VITALS — BP 141/82 | HR 60 | Ht 70.0 in | Wt 171.8 lb

## 2022-05-02 DIAGNOSIS — Z9889 Other specified postprocedural states: Secondary | ICD-10-CM

## 2022-05-02 DIAGNOSIS — G6289 Other specified polyneuropathies: Secondary | ICD-10-CM

## 2022-05-02 MED ORDER — GABAPENTIN 600 MG PO TABS: ORAL_TABLET | ORAL | 3 refills | Status: DC

## 2022-05-02 NOTE — Patient Instructions (Signed)
Gabapentin '600mg'$  at 4pm and bedtime

## 2022-05-03 DIAGNOSIS — H6091 Unspecified otitis externa, right ear: Secondary | ICD-10-CM | POA: Diagnosis not present

## 2022-06-30 DIAGNOSIS — Z23 Encounter for immunization: Secondary | ICD-10-CM | POA: Diagnosis not present

## 2022-07-04 DIAGNOSIS — E7800 Pure hypercholesterolemia, unspecified: Secondary | ICD-10-CM | POA: Diagnosis not present

## 2022-07-04 DIAGNOSIS — N401 Enlarged prostate with lower urinary tract symptoms: Secondary | ICD-10-CM | POA: Diagnosis not present

## 2022-07-04 DIAGNOSIS — G629 Polyneuropathy, unspecified: Secondary | ICD-10-CM | POA: Diagnosis not present

## 2022-07-04 DIAGNOSIS — K219 Gastro-esophageal reflux disease without esophagitis: Secondary | ICD-10-CM | POA: Diagnosis not present

## 2022-07-11 DIAGNOSIS — Z86010 Personal history of colonic polyps: Secondary | ICD-10-CM | POA: Diagnosis not present

## 2022-07-11 DIAGNOSIS — K219 Gastro-esophageal reflux disease without esophagitis: Secondary | ICD-10-CM | POA: Diagnosis not present

## 2022-07-26 DIAGNOSIS — N401 Enlarged prostate with lower urinary tract symptoms: Secondary | ICD-10-CM | POA: Diagnosis not present

## 2022-07-26 DIAGNOSIS — E7800 Pure hypercholesterolemia, unspecified: Secondary | ICD-10-CM | POA: Diagnosis not present

## 2022-07-26 DIAGNOSIS — K219 Gastro-esophageal reflux disease without esophagitis: Secondary | ICD-10-CM | POA: Diagnosis not present

## 2022-07-27 DIAGNOSIS — M25561 Pain in right knee: Secondary | ICD-10-CM | POA: Diagnosis not present

## 2022-08-06 DIAGNOSIS — M25561 Pain in right knee: Secondary | ICD-10-CM | POA: Diagnosis not present

## 2022-08-14 IMAGING — MR MR HEAD WO/W CM
14 series · 46 of 48 positions shown · IV contrast (multihance)
Comparison: MRI head 06/18/2013

CLINICAL DATA: Left abducens palsy. Headache over left eye. Hearing
loss. History of melanoma.

EXAM:
MRI HEAD WITHOUT AND WITH CONTRAST
TECHNIQUE: Multiplanar, multiecho pulse sequences of the brain and surrounding
structures were obtained without and with intravenous contrast.
CONTRAST:  15mL MULTIHANCE GADOBENATE DIMEGLUMINE 529 MG/ML IV SOLN

[Series 2: T1 · sagittal · 5.0mm · 0.45mm/px · 2 of 22 slices shown]
[im 1/22]
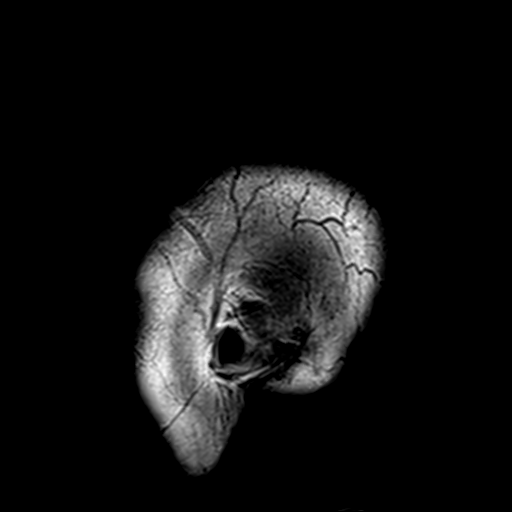
[im 22/22]
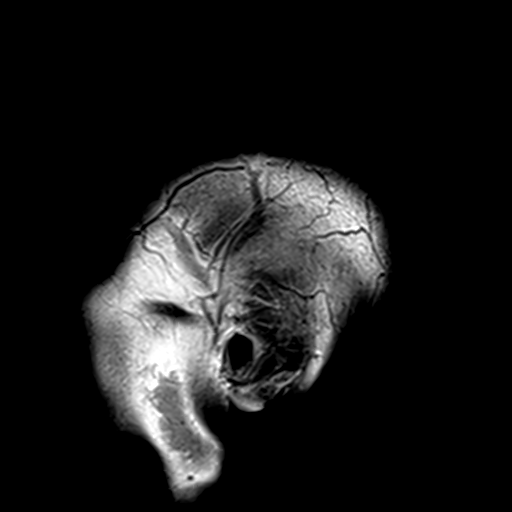

[Series 3: DWI · axial · 3.0mm · 1.80mm/px · z∈[-34,+112]mm · 6 of 104 slices shown (1 of 4)]
[im 1/104]
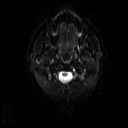
[im 21/104]
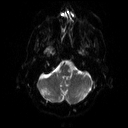
[im 42/104]
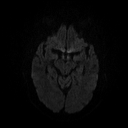
[im 62/104]
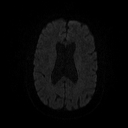
[im 83/104]
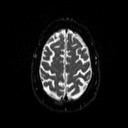
[im 104/104]
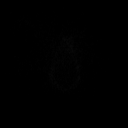

[Series 4: DWI · axial · 3.0mm · 1.80mm/px · z∈[-34,+112]mm · 3 of 50 slices shown (2 of 4)]
[im 1/50]
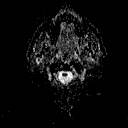
[im 25/50]
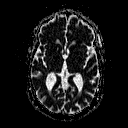
[im 50/50]
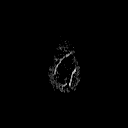

[Series 5: DWI · coronal · 5.0mm · 1.80mm/px · 4 of 75 slices shown (3 of 4)]
[im 1/75]
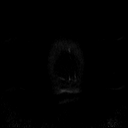
[im 25/75]
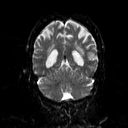
[im 50/75]
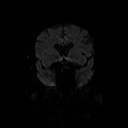
[im 75/75]
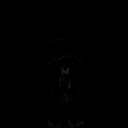

[Series 6: DWI · coronal · 5.0mm · 1.80mm/px · 2 of 38 slices shown (4 of 4)]
[im 1/38]
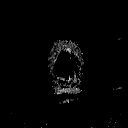
[im 38/38]
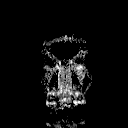

[Series 7: T2 · axial · 5.0mm · 0.60mm/px · 1 of 24 slices shown (1 of 2)]
[im 1/24]
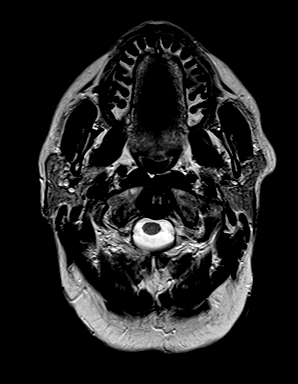

[Series 8: FLAIR · axial · 3.0mm · 0.45mm/px · z∈[-39,+113]mm · 2 of 35 slices shown (1 of 2)]
[im 1/35]
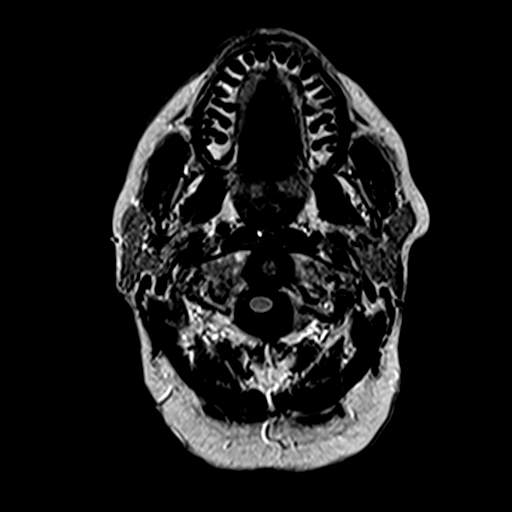
[im 35/35]
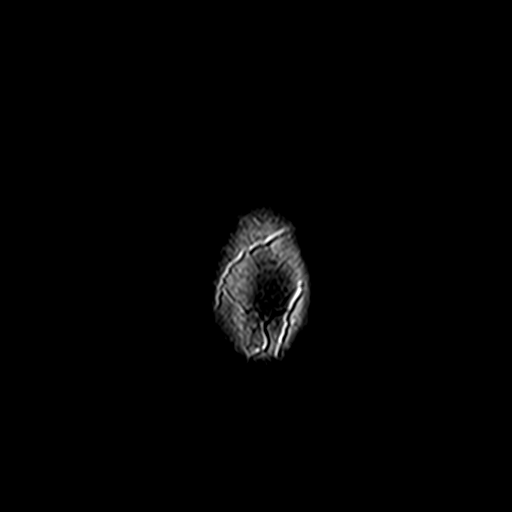

[Series 10: swi_images · axial · 4.0mm · 0.90mm/px · z∈[-37,+112]mm · 2 of 40 slices shown]
[im 1/40]
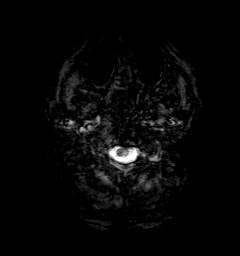
[im 40/40]
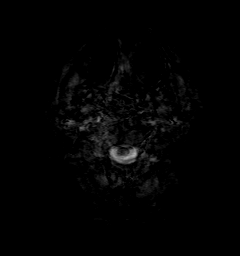

[Series 11: t1_mpr_tra · axial · 1.0mm · 0.75mm/px · z∈[-25,+112]mm · 8 of 144 slices shown (1 of 2)]
[im 1/144]
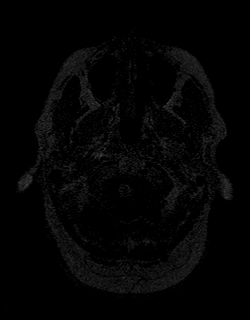
[im 18/144]
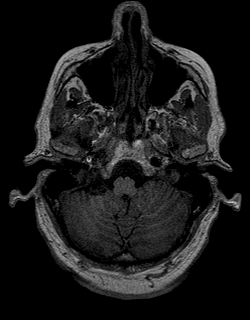
[im 36/144]
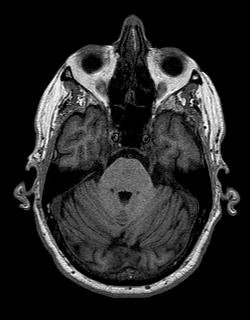
[im 54/144]
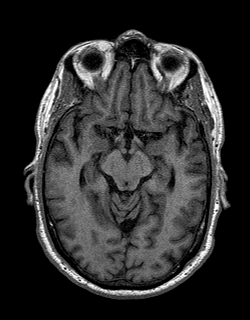
[im 90/144]
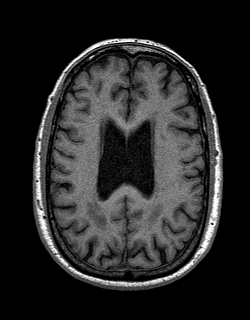
[im 108/144]
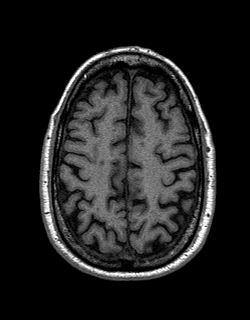
[im 126/144]
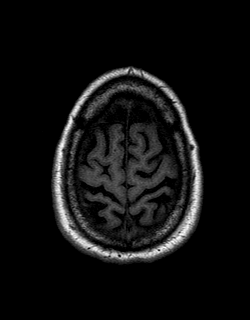
[im 144/144]
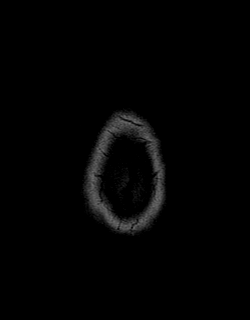

[Series 12: bSSFP · axial · 1.0mm · 0.28mm/px · z∈[-54,-11]mm · 3 of 44 slices shown]
[im 1/44]
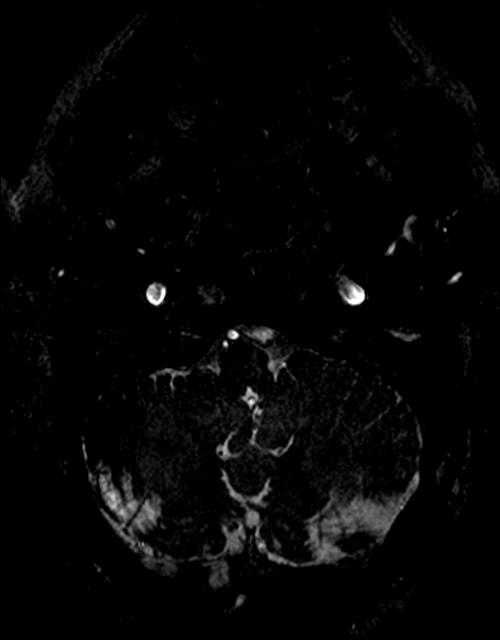
[im 22/44]
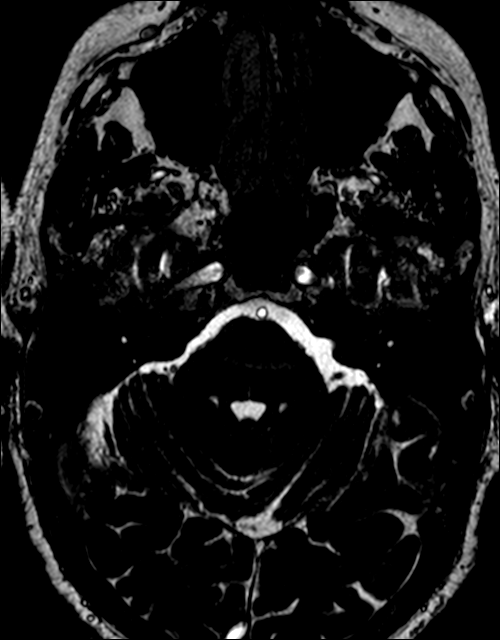
[im 44/44]
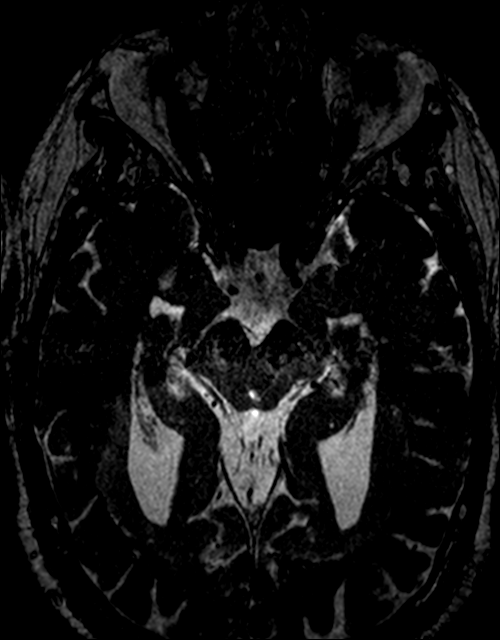

[Series 13: T2 · coronal · 5.0mm · 0.45mm/px · 2 of 28 slices shown (2 of 2)]
[im 1/28]
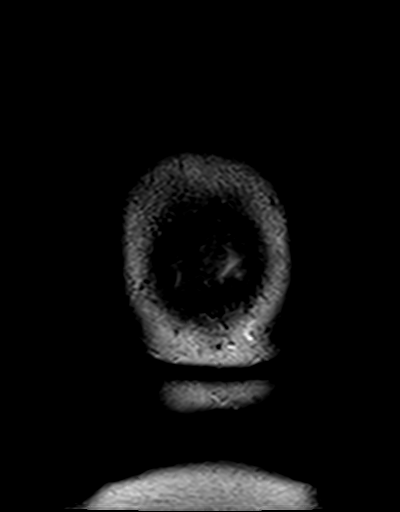
[im 28/28]
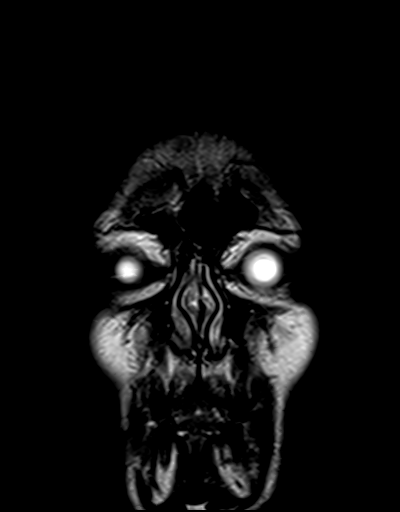

[Series 14: t1_mpr_tra · axial · 1.0mm · 0.75mm/px · z∈[-25,+112]mm · 8 of 144 slices shown (2 of 2)]
[im 1/144]
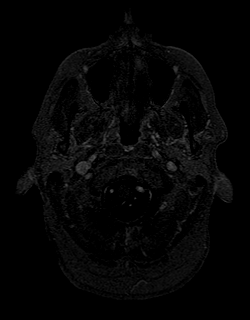
[im 18/144]
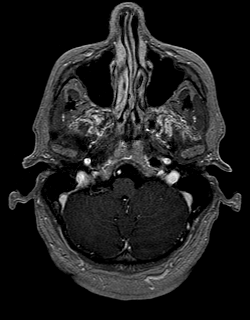
[im 36/144]
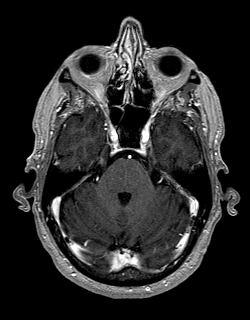
[im 54/144]
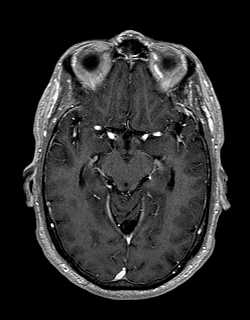
[im 90/144]
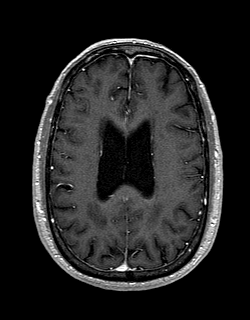
[im 108/144]
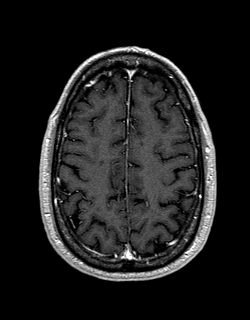
[im 126/144]
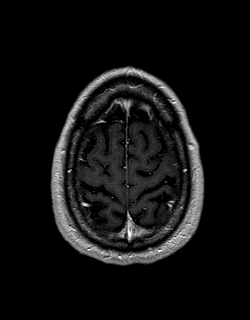
[im 144/144]
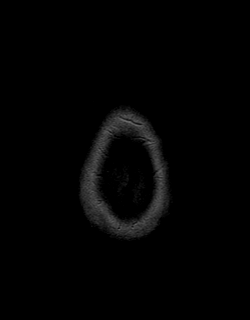

[Series 15: post cor · coronal · 5.0mm · 0.45mm/px · 2 of 28 slices shown]
[im 1/28]
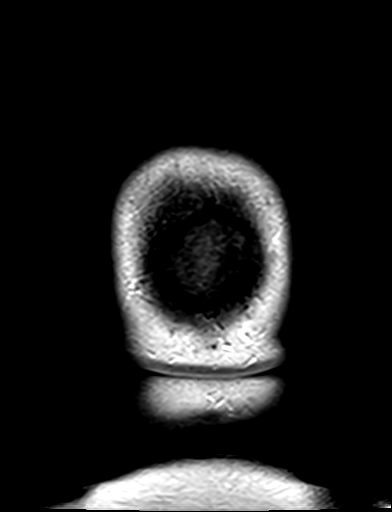
[im 28/28]
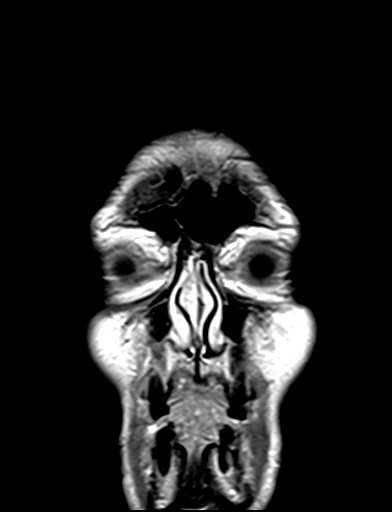

[Series 16: FLAIR · sagittal · 5.0mm · 0.45mm/px · 1 of 25 slices shown (2 of 2)]
[im 1/25]
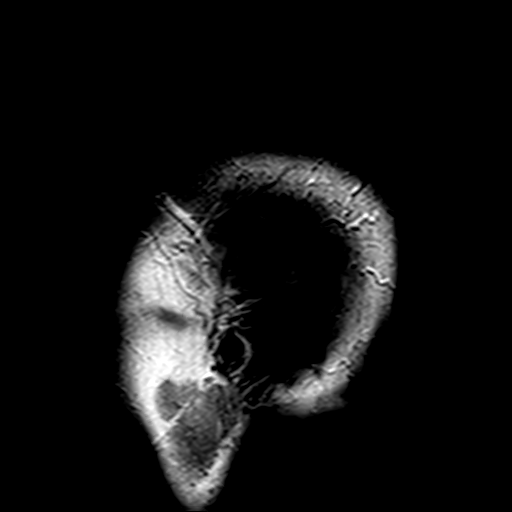

[46 of 48 positions shown; findings below may reference images not displayed]

FINDINGS: Brain: Ventricle size and cerebral volume normal for age. Negative
for acute infarct

Periventricular deep white matter hyperintensities bilaterally have
progressed in the interval. Brainstem and cerebellum normal.
Negative for hemorrhage or mass

Brainstem normal. Basilar cisterns normal. Mastoid sinus clear
bilaterally. No enhancing mass in the posterior fossa. Cavernous
sinus normal bilaterally.

Vascular: Normal arterial flow voids.  Normal venous enhancement.

Skull and upper cervical spine: Negative

Sinuses/Orbits: Mild mucosal edema paranasal sinuses. Mastoid clear.
Bilateral cataract extraction

Other: None
IMPRESSION: No explanation for 6 nerve palsy. Brainstem and cavernous sinus
normal. No orbital mass.

No acute abnormality. Progression of chronic microvascular ischemic
change in the white matter compared with MRI of 06/18/2013

## 2022-08-23 ENCOUNTER — Ambulatory Visit (HOSPITAL_COMMUNITY)
Admission: RE | Admit: 2022-08-23 | Discharge: 2022-08-23 | Disposition: A | Discharge status: Home / Self Care | Payer: Medicare Other | Source: Ambulatory Visit | Attending: Cardiology | Admitting: Cardiology

## 2022-08-23 ENCOUNTER — Encounter (HOSPITAL_COMMUNITY): Payer: Self-pay | Admitting: Cardiology

## 2022-08-23 VITALS — BP 120/70 | HR 59 | Wt 163.8 lb

## 2022-08-23 DIAGNOSIS — I5032 Chronic diastolic (congestive) heart failure: Secondary | ICD-10-CM | POA: Insufficient documentation

## 2022-08-23 DIAGNOSIS — I2510 Atherosclerotic heart disease of native coronary artery without angina pectoris: Secondary | ICD-10-CM | POA: Insufficient documentation

## 2022-08-23 DIAGNOSIS — I7121 Aneurysm of the ascending aorta, without rupture: Secondary | ICD-10-CM | POA: Insufficient documentation

## 2022-08-23 DIAGNOSIS — M1711 Unilateral primary osteoarthritis, right knee: Secondary | ICD-10-CM | POA: Diagnosis not present

## 2022-08-23 DIAGNOSIS — E785 Hyperlipidemia, unspecified: Secondary | ICD-10-CM | POA: Insufficient documentation

## 2022-08-23 DIAGNOSIS — Z79899 Other long term (current) drug therapy: Secondary | ICD-10-CM | POA: Insufficient documentation

## 2022-08-23 DIAGNOSIS — G629 Polyneuropathy, unspecified: Secondary | ICD-10-CM | POA: Insufficient documentation

## 2022-08-23 DIAGNOSIS — I350 Nonrheumatic aortic (valve) stenosis: Secondary | ICD-10-CM | POA: Insufficient documentation

## 2022-08-23 LAB — CBC
HCT: 41.2 % (ref 39.0–52.0)
Hemoglobin: 14 g/dL (ref 13.0–17.0)
MCH: 31 pg (ref 26.0–34.0)
MCHC: 34 g/dL (ref 30.0–36.0)
MCV: 91.2 fL (ref 80.0–100.0)
Platelets: 179 10*3/uL (ref 150–400)
RBC: 4.52 MIL/uL (ref 4.22–5.81)
RDW: 13.1 % (ref 11.5–15.5)
WBC: 8.1 10*3/uL (ref 4.0–10.5)
nRBC: 0 % (ref 0.0–0.2)

## 2022-08-23 LAB — LIPID PANEL
Cholesterol: 130 mg/dL (ref 0–200)
HDL: 59 mg/dL (ref >40)
LDL Cholesterol: 56 mg/dL (ref 0–99)
Total CHOL/HDL Ratio: 2.2 RATIO
Triglycerides: 73 mg/dL (ref <150)
VLDL: 15 mg/dL (ref 0–40)

## 2022-08-23 LAB — BASIC METABOLIC PANEL
Anion gap: 7 (ref 5–15)
BUN: 17 mg/dL (ref 8–23)
CO2: 25 mmol/L (ref 22–32)
Calcium: 9.7 mg/dL (ref 8.9–10.3)
Chloride: 105 mmol/L (ref 98–111)
Creatinine, Ser: 0.98 mg/dL (ref 0.61–1.24)
GFR, Estimated: >60 mL/min (ref >60)
Glucose, Bld: 95 mg/dL (ref 70–99)
Potassium: 4.3 mmol/L (ref 3.5–5.1)
Sodium: 137 mmol/L (ref 135–145)

## 2022-08-23 NOTE — Patient Instructions (Signed)
There has been no changes to your medications.  Labs done today, your results will be available in MyChart, we will contact you for abnormal readings.  Your physician has requested that you have an echocardiogram. Echocardiography is a painless test that uses sound waves to create images of your heart. It provides your doctor with information about the size and shape of your heart and how well your heart's chambers and valves are working. This procedure takes approximately one hour. There are no restrictions for this procedure. Please do NOT wear cologne, perfume, aftershave, or lotions (deodorant is allowed). Please arrive 15 minutes prior to your appointment time.  Your physician recommends that you schedule a follow-up appointment in: 1 year ( December 2024)  ** please call the office in September 2024 to arrange your follow up appointment **  If you have any questions or concerns before your next appointment please send [place] a message through [place] or call our office at [phone].    TO LEAVE A MESSAGE FOR THE NURSE SELECT OPTION 2, PLEASE LEAVE A MESSAGE INCLUDING: YOUR NAME DATE OF BIRTH CALL BACK NUMBER REASON FOR CALL**this is important as we prioritize the call backs  YOU WILL RECEIVE A CALL BACK THE SAME DAY AS LONG AS YOU CALL BEFORE 4:00 PM  At the [facility], you and your health needs are our priority. As part of our continuing mission to provide you with exceptional heart care, we have created designated Provider Care Teams. These Care Teams include your primary Cardiologist (physician) and Advanced Practice Providers (APPs- Physician Assistants and Nurse Practitioners) who all work together to provide you with the care you need, when you need it.   You may see any of the following providers on your designated Care Team at your next follow up: Dr Glori Bickers Dr Loralie Champagne Dr. Roxana Hires, NP Lyda Jester, [place] [facility] [place], [place] Forestine Na, NP Audry Riles, PharmD   Please be sure to bring in all your medications bottles to every appointment.

## 2022-08-24 NOTE — Progress Notes (Signed)
Patient ID: Danny [name], male   DOB: 09-May-1936, 86 y.o.   MRN: [id number] PCP: Dr. Felipa Eth Cardiology: Dr. Aundra Dubin  86 y.o. yo with history of nonobstructive CAD presents for followup of CAD and aortic stenosis.  He last had an ETT-Cardiolite in 6/16 that was normal.  Echo in 9/17 showed EF 55-60%, mild aortic stenosis, 4.0 cm ascending aorta.  MRA chest in 12/18 showed tortuous thoracic aorta without aneurysm. Echo in 11/19 showed EF 60-65%, mild AS/mild AI.  Echo in 12/21 showed EF 60-65% with mild AI, mild AS.  PYP scan in 12/21 was grade 1 with H/CL 1.15, probably negative for TTR cardiac amyloidosis.   Cardiolite in 1/23 showed EF 68%, no ischemia/infarction.   Patient has been doing well from a cardiac standpoint.  He has had no exertional dyspnea or chest pain.  He has right knee pain from OA, bothers him when he golfs.  No palpitations. SBP 110s-120s at home.   ECG (personally reviewed): NSR, 1st degree AVB  Labs (8/14): K 3.9, creatinine 0.89 Labs (12/14): LDL 62, HDL 48   Labs (6/16): LDL 56 Labs (1/17): creatinine 0.88, HCT 43.9 Labs (8/17): K 4.3, creatinine 0.88, LFTs normal Labs (9/17): CK normal, Mg normal Labs (11/17): LDL 32, HDL 52 Labs (11/18): LDL 50 Labs (11/19): LDL 57, HDL 53, K 4, creatinine 0.82 Labs (3/21): LDL 53 Labs (4/21): K 3.8, creatinine 0.83, myeloma panel negative, urine immunofixation negative, LDL 68 Labs (11/22): LDL 45  PMH: 1. Rotator cuff surgery 2. Diverticulosis 3. Chronic persistent hepatitis 4. Impaired fasting glucose 5. 6th nerve palsy in 2001 6. Allergic rhinitis 7. Low back pain s/p several operations.  Most recently had microdiscectomy in 1/17.  8. CAD: Patient developed exertional dyspnea.  ETT-myoview (8/12) with 11 METS, normal LV EF, normal perfusion at rest and stress but elevated TID ratio.  Coronary CT angiogram in 9/12 showed equivocal significant disease in the CFX.  Cath in 10/12 showed EF 55-60%, 30% mid RCA, 40%  distal RCA, 40% proximal LAD.  ETT-Cardiolite in 10/14 showed EF 62%, probably normal study with diaphragmatic attenuation.   Echo (4/15) with EF 55-60%, mild MR.  - Cardiolite (6/16) with no ischemia/infarction.  - Echo (9/17) with EF 55-60%, mild aortic stenosis, 4.0 cm ascending aorta.  - Echo (11/19): EF 60-65%, mild AS mean gradient 9 mmHg with AVA 1.53 cm^2, mild AI.  - Echo (12/21): EF 60-65%, mild AS, mild AI.  - Cardiolite (1/23): EF 68%, no ischemia/infarction. 9. GERD 10. Hyperlipidemia: Myalgias with atorvastatin.  11. Aortic stenosis: Mild on 9/17 echo. Mild on 11/19 echo. Mild on 12/21 echo. 12. Ascending aorta dilation: MRA chest (12/18) showed a tortuous ascending aorta without aneurysmal dilation.  13. Peripheral neuropathy of uncertain etiology.  14. PYP scan (12/21): grade 1, H/CL 1.15 (probably negative)  FH: Father with CVA at 32  SH: Married, never smoked, rare ETOH.   ROS: All systems reviewed and negative except as per HPI.   Current Outpatient Medications  Medication Sig Dispense Refill   acetaminophen (TYLENOL) 500 MG tablet Take 500 mg by mouth every 6 (six) hours as needed.     aspirin 81 MG tablet Take 81 mg by mouth daily.     betamethasone dipropionate 0.05 % cream Apply 0.5 application topically as needed.     Calcium Citrate-Vitamin D (CITRACAL + D PO) Take 1 tablet by mouth daily.     Cholecalciferol 50 MCG (2000 UT) CAPS Take 1,000 Units by mouth in  the morning and at bedtime.     Coenzyme Q10 (CO Q 10 PO) Take 400 mg by mouth daily.     docusate sodium (COLACE) 100 MG capsule Take 100 mg by mouth 2 (two) times daily.     esomeprazole (NEXIUM) 20 MG capsule Take 20 mg by mouth daily at 12 noon.     finasteride (PROSCAR) 5 MG tablet Take 5 mg by mouth daily.     gabapentin (NEURONTIN) 600 MG tablet TAKE 1 TABLET AT 4PM AND TAKE 1 TABLET AT BEDTIME. 180 tablet 3   Magnesium Oxide 400 MG CAPS Take 1 capsule (400 mg total) by mouth daily. 30 capsule 11    Multiple Vitamin (MULTIVITAMIN) tablet Take 1 tablet by mouth daily.     Multiple Vitamins-Minerals (PRESERVISION AREDS 2) CAPS 1 capsule     nitroGLYCERIN (NITROSTAT) 0.4 MG SL tablet ONE TABLET UNDER TONGUE WHEN NEEDED FOR CHEST PAIN. MAY REPEAT IN 5 MINUTES. 25 tablet 6   Probiotic Product (PROBIOTIC-10 PO) 1 capsule     psyllium (METAMUCIL) 58.6 % packet Take by mouth.     RESTASIS 0.05 % ophthalmic emulsion Place 1 drop into both eyes 2 (two) times daily.      rosuvastatin (CRESTOR) 5 MG tablet TAKE 1 TABLET ONCE DAILY. 90 tablet 0   Saw Palmetto 160 MG CAPS 1 capsule     tamsulosin (FLOMAX) 0.4 MG CAPS capsule Take 0.4 mg by mouth.     traMADol (ULTRAM) 50 MG tablet Take 50 mg by mouth as needed.     traZODone (DESYREL) 50 MG tablet Take 25 mg by mouth at bedtime as needed.     Turmeric (QC TUMERIC COMPLEX) 500 MG CAPS Take 500 mg by mouth daily.     zolpidem (AMBIEN) 10 MG tablet Take 10 mg by mouth at bedtime as needed for sleep.     No current facility-administered medications for this encounter.    BP 120/70   Pulse (!) 59   Wt 74.3 kg (163 lb 12.8 oz)   SpO2 96%   BMI 23.50 kg/m  General: NAD Neck: No JVD, no thyromegaly or thyroid nodule.  Lungs: Clear to auscultation bilaterally with normal respiratory effort. CV: Nondisplaced PMI.  Heart regular S1/S2, no S3/S4, 2/6 early SEM RUSB.  No peripheral edema.  No carotid bruit.  Normal pedal pulses.  Abdomen: Soft, nontender, no hepatosplenomegaly, no distention.  Skin: Intact without lesions or rashes.  Neurologic: Alert and oriented x 3.  Psych: Normal affect. Extremities: No clubbing or cyanosis.  HEENT: Normal.   Assessment/Plan:  1. CAD: Nonobstructive on prior cath.  Low risk Cardiolite in 1/23.  No chest pain.  - Continue ASA 81.  - Continue Crestor.  2. Hyperlipidemia: Goal LDL < 70 with known CAD.  Myalgias with atorvastatin, now tolerating Crestor.   - Check lipids today. 3. Aortic stenosis: Mild on 12/21  echo.  Murmur does not sound severe.   - I will arrange for echo to follow.  4. Ascending aortic aneurysm: Mild, 4.0 cm on 9/17 echo. MRA chest in 12/18 showed tortuous aorta without frank aneurysmal dilation.   5. Peripheral neuropathy: polyneuropathy of uncertain etiology.  Also with history of aortic stenosis and LVH on last echo. Transthyretin amyloidosis is a concern with this clinical presentation, but PYP scan was not suggestive of TTR cardiac amyloidosis. Myeloma workup also was negative.   Followup 1 year   Loralie Champagne 08/24/2022

## 2022-08-29 DIAGNOSIS — Z23 Encounter for immunization: Secondary | ICD-10-CM | POA: Diagnosis not present

## 2022-09-05 ENCOUNTER — Ambulatory Visit (HOSPITAL_COMMUNITY)
Admission: RE | Admit: 2022-09-05 | Discharge: 2022-09-05 | Disposition: A | Discharge status: Home / Self Care | Payer: Medicare Other | Source: Ambulatory Visit | Attending: Geriatric Medicine | Admitting: Geriatric Medicine

## 2022-09-05 DIAGNOSIS — E785 Hyperlipidemia, unspecified: Secondary | ICD-10-CM | POA: Diagnosis not present

## 2022-09-05 DIAGNOSIS — I080 Rheumatic disorders of both mitral and aortic valves: Secondary | ICD-10-CM | POA: Insufficient documentation

## 2022-09-05 DIAGNOSIS — I2510 Atherosclerotic heart disease of native coronary artery without angina pectoris: Secondary | ICD-10-CM | POA: Insufficient documentation

## 2022-09-05 DIAGNOSIS — I5032 Chronic diastolic (congestive) heart failure: Secondary | ICD-10-CM | POA: Diagnosis not present

## 2022-09-05 LAB — ECHOCARDIOGRAM COMPLETE
AR max vel: 0.85 cm2
AV Area VTI: 0.81 cm2
AV Area mean vel: 0.73 cm2
AV Mean grad: 26 mmHg
AV Peak grad: 41 mmHg
Ao pk vel: 3.2 m/s
Area-P 1/2: 3.21 cm2
S' Lateral: 3.2 cm

## 2022-09-05 NOTE — Progress Notes (Signed)
  Echocardiogram 2D Echocardiogram has been performed.  Danny [name] 09/05/2022, 3:39 PM

## 2022-09-06 ENCOUNTER — Telehealth (HOSPITAL_COMMUNITY): Payer: Self-pay

## 2022-09-06 DIAGNOSIS — M47817 Spondylosis without myelopathy or radiculopathy, lumbosacral region: Secondary | ICD-10-CM | POA: Diagnosis not present

## 2022-09-06 NOTE — Telephone Encounter (Signed)
Patient aware of results.

## 2022-09-13 DIAGNOSIS — M25561 Pain in right knee: Secondary | ICD-10-CM | POA: Diagnosis not present

## 2022-10-16 DIAGNOSIS — M47817 Spondylosis without myelopathy or radiculopathy, lumbosacral region: Secondary | ICD-10-CM | POA: Diagnosis not present

## 2022-10-16 DIAGNOSIS — Z981 Arthrodesis status: Secondary | ICD-10-CM | POA: Diagnosis not present

## 2022-10-25 DIAGNOSIS — S83241A Other tear of medial meniscus, current injury, right knee, initial encounter: Secondary | ICD-10-CM | POA: Diagnosis not present

## 2022-10-27 DIAGNOSIS — S83241A Other tear of medial meniscus, current injury, right knee, initial encounter: Secondary | ICD-10-CM | POA: Insufficient documentation

## 2022-11-02 ENCOUNTER — Telehealth (HOSPITAL_COMMUNITY): Payer: Self-pay | Admitting: *Deleted

## 2022-11-02 MED ORDER — METOPROLOL SUCCINATE ER 25 MG PO TB24: 25.0000 mg | ORAL_TABLET | Freq: Every day | ORAL | 3 refills | Status: DC

## 2022-11-02 NOTE — Telephone Encounter (Signed)
Pts wife called stating pt has been c/o dizziness and headache x 1 week. She checked his bp nd its been 140s-150's/70's and heart rate in the 70s. She said pt had some 50m metoprolol and decided to take half of a tablet. She said it brought bp down to 120's/60s and heart rate 70s. The dizziness and headache also went away but every morning he wakes up with elevated bp and headache until he takes metoprolol 12.538m she asked if he should continue or be put on another medication as this is an old script and not currently prescribed to him.

## 2022-11-02 NOTE — Telephone Encounter (Signed)
Spoke with pts wife she is aware and script sent to pharmacy.

## 2022-11-02 NOTE — Telephone Encounter (Signed)
OK to start Toprol XL 25 mg daily.

## 2022-11-28 DIAGNOSIS — H5310 Unspecified subjective visual disturbances: Secondary | ICD-10-CM | POA: Diagnosis not present

## 2022-11-28 DIAGNOSIS — R519 Headache, unspecified: Secondary | ICD-10-CM | POA: Diagnosis not present

## 2022-11-28 DIAGNOSIS — Z961 Presence of intraocular lens: Secondary | ICD-10-CM | POA: Diagnosis not present

## 2022-12-06 DIAGNOSIS — R519 Headache, unspecified: Secondary | ICD-10-CM | POA: Diagnosis not present

## 2022-12-06 DIAGNOSIS — H6121 Impacted cerumen, right ear: Secondary | ICD-10-CM | POA: Diagnosis not present

## 2022-12-11 DIAGNOSIS — Z08 Encounter for follow-up examination after completed treatment for malignant neoplasm: Secondary | ICD-10-CM | POA: Diagnosis not present

## 2022-12-11 DIAGNOSIS — Z85820 Personal history of malignant melanoma of skin: Secondary | ICD-10-CM | POA: Diagnosis not present

## 2022-12-11 DIAGNOSIS — L728 Other follicular cysts of the skin and subcutaneous tissue: Secondary | ICD-10-CM | POA: Diagnosis not present

## 2022-12-11 DIAGNOSIS — L821 Other seborrheic keratosis: Secondary | ICD-10-CM | POA: Diagnosis not present

## 2022-12-11 DIAGNOSIS — D225 Melanocytic nevi of trunk: Secondary | ICD-10-CM | POA: Diagnosis not present

## 2022-12-11 DIAGNOSIS — L814 Other melanin hyperpigmentation: Secondary | ICD-10-CM | POA: Diagnosis not present

## 2022-12-13 ENCOUNTER — Other Ambulatory Visit (HOSPITAL_COMMUNITY): Payer: Self-pay | Admitting: Internal Medicine

## 2023-03-19 DIAGNOSIS — M47817 Spondylosis without myelopathy or radiculopathy, lumbosacral region: Secondary | ICD-10-CM | POA: Diagnosis not present

## 2023-03-20 ENCOUNTER — Ambulatory Visit (HOSPITAL_COMMUNITY)
Admission: RE | Admit: 2023-03-20 | Discharge: 2023-03-20 | Disposition: A | Discharge status: Home / Self Care | Payer: Medicare Other | Source: Ambulatory Visit | Attending: Family Medicine | Admitting: Family Medicine

## 2023-03-20 ENCOUNTER — Encounter (HOSPITAL_COMMUNITY): Payer: Self-pay

## 2023-03-20 VITALS — BP 130/88 | HR 66 | Wt 163.0 lb

## 2023-03-20 DIAGNOSIS — I7121 Aneurysm of the ascending aorta, without rupture: Secondary | ICD-10-CM | POA: Diagnosis not present

## 2023-03-20 DIAGNOSIS — I25119 Atherosclerotic heart disease of native coronary artery with unspecified angina pectoris: Secondary | ICD-10-CM | POA: Diagnosis not present

## 2023-03-20 DIAGNOSIS — G629 Polyneuropathy, unspecified: Secondary | ICD-10-CM | POA: Diagnosis not present

## 2023-03-20 DIAGNOSIS — I714 Abdominal aortic aneurysm, without rupture, unspecified: Secondary | ICD-10-CM

## 2023-03-20 DIAGNOSIS — E785 Hyperlipidemia, unspecified: Secondary | ICD-10-CM | POA: Insufficient documentation

## 2023-03-20 DIAGNOSIS — I350 Nonrheumatic aortic (valve) stenosis: Secondary | ICD-10-CM | POA: Insufficient documentation

## 2023-03-20 DIAGNOSIS — R0600 Dyspnea, unspecified: Secondary | ICD-10-CM | POA: Insufficient documentation

## 2023-03-20 DIAGNOSIS — Z79899 Other long term (current) drug therapy: Secondary | ICD-10-CM | POA: Insufficient documentation

## 2023-03-20 DIAGNOSIS — R0609 Other forms of dyspnea: Secondary | ICD-10-CM

## 2023-03-20 DIAGNOSIS — I5032 Chronic diastolic (congestive) heart failure: Secondary | ICD-10-CM | POA: Diagnosis not present

## 2023-03-20 LAB — CBC
HCT: 42.7 % (ref 39.0–52.0)
Hemoglobin: 14.3 g/dL (ref 13.0–17.0)
MCH: 31.2 pg (ref 26.0–34.0)
MCHC: 33.5 g/dL (ref 30.0–36.0)
MCV: 93.2 fL (ref 80.0–100.0)
Platelets: 214 K/uL (ref 150–400)
RBC: 4.58 MIL/uL (ref 4.22–5.81)
RDW: 13 % (ref 11.5–15.5)
WBC: 11.2 K/uL — ABNORMAL HIGH (ref 4.0–10.5)
nRBC: 0 % (ref 0.0–0.2)

## 2023-03-20 LAB — BASIC METABOLIC PANEL WITH GFR
Anion gap: 7 (ref 5–15)
BUN: 11 mg/dL (ref 8–23)
CO2: 24 mmol/L (ref 22–32)
Calcium: 9.9 mg/dL (ref 8.9–10.3)
Chloride: 106 mmol/L (ref 98–111)
Creatinine, Ser: 0.76 mg/dL (ref 0.61–1.24)
GFR, Estimated: >60 mL/min
Glucose, Bld: 126 mg/dL — ABNORMAL HIGH (ref 70–99)
Potassium: 3.9 mmol/L (ref 3.5–5.1)
Sodium: 137 mmol/L (ref 135–145)

## 2023-03-20 LAB — BRAIN NATRIURETIC PEPTIDE: B Natriuretic Peptide: 118.8 pg/mL — ABNORMAL HIGH (ref 0.0–100.0)

## 2023-03-20 MED ORDER — FUROSEMIDE 20 MG PO TABS: 20.0000 mg | ORAL_TABLET | ORAL | 0 refills | Status: DC | PRN

## 2023-03-20 NOTE — Progress Notes (Signed)
ReDS Vest / Clip - 03/20/23 1300       ReDS Vest / Clip   Station Marker C    Ruler Value 28    ReDS Value Range Moderate volume overload    ReDS Actual Value 39

## 2023-03-20 NOTE — Progress Notes (Signed)
Patient ID: Danny [name], male   DOB: [birth year], 87 y.o.   MRN: [id number] PCP: Dr. Pete Glatter Cardiology: Dr. Shirlee Latch  87 y.o. yo with history of nonobstructive CAD presents for followup of CAD and aortic stenosis.  He last had an ETT-Cardiolite in 6/16 that was normal.  Echo in 9/17 showed EF 55-60%, mild aortic stenosis, 4.0 cm ascending aorta.  MRA chest in 12/18 showed tortuous thoracic aorta without aneurysm. Echo in 11/19 showed EF 60-65%, mild AS/mild AI.  Echo in 12/21 showed EF 60-65% with mild AI, mild AS.  PYP scan in 12/21 was grade 1 with H/CL 1.15, probably negative for TTR cardiac amyloidosis.   Cardiolite in 1/23 showed EF 68%, no ischemia/infarction.   Patient has been doing well from a cardiac standpoint.  He has had no exertional dyspnea or chest pain.  He has right knee pain from OA, bothers him when he golfs.  No palpitations. SBP 110s-120s at home.   Echo 12/23 showed EF 60-65%, RV normal, moderate aortic stenosis, AVA 0.81 cm, AoV mean gradient 26 mmHg, peak gradient 41 mmHg.  Today he returns for an acute visit with his wife. He has had 2 weeks of LLE swelling, occasional nonexertional CP requiring SL nitroglycerin. He is unsure if CP related to gas as he has relief when taking Tums. He has had SOB walking halfway up flight of steps or while playing golf. He has dizziness in the mornings, no falls and no syncope. Denies palpitations, abnormal bleeding, or PND/Orthopnea. Appetite ok. No fever or chills. Weight at home 165 pounds. Taking all medications. BP at home 120-140s/65-80. Has PRN Toprol, does not use regularly. 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  ReDs: 39%  ECG (personally reviewed): NSR, 1st degree AVB 63 bpm  Labs (8/14): K 3.9, creatinine 0.89 Labs (12/14): LDL 62, HDL 48   Labs (6/16): LDL 56 Labs (1/17): creatinine 0.88, HCT 43.9 Labs (8/17): K 4.3, creatinine 0.88, LFTs normal Labs (9/17): CK normal, Mg  normal Labs (11/17): LDL 32, HDL 52 Labs (11/18): LDL 50 Labs (11/19): LDL 57, HDL 53, K 4, creatinine 0.82 Labs (3/21): LDL 53 Labs (4/21): K 3.8, creatinine 0.83, myeloma panel negative, urine immunofixation negative, LDL 68 Labs (11/22): LDL 45 Labs (12/23): K 4.3, creatinine 0.98, LDL 56  PMH: 1. Rotator cuff surgery 2. Diverticulosis 3. Chronic persistent hepatitis 4. Impaired fasting glucose 5. 6th nerve palsy in 2001 6. Allergic rhinitis 7. Low back pain s/p several operations.  Most recently had microdiscectomy in 1/17.  8. CAD: Patient developed exertional dyspnea.  ETT-myoview (8/12) with 11 METS, normal LV EF, normal perfusion at rest and stress but elevated TID ratio.  Coronary CT angiogram in 9/12 showed equivocal significant disease in the CFX.  Cath in 10/12 showed EF 55-60%, 30% mid RCA, 40% distal RCA, 40% proximal LAD.  ETT-Cardiolite in 10/14 showed EF 62%, probably normal study with diaphragmatic attenuation.   Echo (4/15) with EF 55-60%, mild MR.  - Cardiolite (6/16) with no ischemia/infarction.  - Echo (9/17) with EF 55-60%, mild aortic stenosis, 4.0 cm ascending aorta.  - Echo (11/19): EF 60-65%, mild AS mean gradient 9 mmHg with AVA 1.53 cm^2, mild AI.  - Echo (12/21): EF 60-65%, mild AS, mild AI.  - Cardiolite (1/23): EF 68%, no ischemia/infarction. 9. GERD 10. Hyperlipidemia: Myalgias with atorvastatin.  11. Aortic stenosis: Mild on 9/17 echo. Mild on 11/19 echo. Mild on 12/21 echo. - moderate on 12/23  echo 12. Ascending aorta dilation: MRA chest (12/18) showed a tortuous ascending aorta without aneurysmal dilation.  13. Peripheral neuropathy of uncertain etiology.  14. PYP scan (12/21): grade 1, H/CL 1.15 (probably negative)  FH: Father with CVA at 46  SH: Married, never smoked, rare ETOH.   ROS: All systems reviewed and negative except as per HPI.   Current Outpatient Medications  Medication Sig Dispense Refill   acetaminophen (TYLENOL) 500 MG tablet  Take 500 mg by mouth every 6 (six) hours as needed.     aspirin 81 MG tablet Take 81 mg by mouth daily.     betamethasone dipropionate 0.05 % cream Apply 0.5 application topically as needed.     Coenzyme Q10 (CO Q 10 PO) Take 400 mg by mouth daily.     esomeprazole (NEXIUM) 20 MG capsule Take 20 mg by mouth daily at 12 noon.     finasteride (PROSCAR) 5 MG tablet Take 5 mg by mouth daily.     gabapentin (NEURONTIN) 600 MG tablet TAKE 1 TABLET AT 4PM AND TAKE 1 TABLET AT BEDTIME. 180 tablet 3   Magnesium Oxide 400 MG CAPS Take 1 capsule (400 mg total) by mouth daily. 30 capsule 11   Multiple Vitamin (MULTIVITAMIN) tablet Take 1 tablet by mouth daily.     Multiple Vitamins-Minerals (PRESERVISION AREDS 2) CAPS 1 capsule     nitroGLYCERIN (NITROSTAT) 0.4 MG SL tablet ONE TABLET UNDER TONGUE WHEN NEEDED FOR CHEST PAIN. MAY REPEAT IN 5 MINUTES. 25 tablet 6   Probiotic Product (PROBIOTIC-10 PO) 1 capsule     psyllium (METAMUCIL) 58.6 % packet Take by mouth.     RESTASIS 0.05 % ophthalmic emulsion Place 1 drop into both eyes 2 (two) times daily.      rosuvastatin (CRESTOR) 5 MG tablet TAKE 1 TABLET ONCE DAILY. 90 tablet 0   Saw Palmetto 160 MG CAPS Take 1 capsule by mouth 2 (two) times daily.     tamsulosin (FLOMAX) 0.4 MG CAPS capsule Take 0.4 mg by mouth daily.     traMADol (ULTRAM) 50 MG tablet Take 50 mg by mouth as needed.     traZODone (DESYREL) 50 MG tablet Take 25 mg by mouth at bedtime as needed.     Turmeric (QC TUMERIC COMPLEX) 500 MG CAPS Take 500 mg by mouth daily.     zolpidem (AMBIEN) 10 MG tablet Take 10 mg by mouth at bedtime as needed for sleep.     Calcium Citrate-Vitamin D (CITRACAL + D PO) Take 1 tablet by mouth daily.     Cholecalciferol 50 MCG (2000 UT) CAPS Take 1,000 Units by mouth in the morning and at bedtime.     docusate sodium (COLACE) 100 MG capsule Take 100 mg by mouth 2 (two) times daily.     metoprolol succinate (TOPROL XL) 25 MG 24 hr tablet Take 1 tablet (25 mg  total) by mouth daily. (Patient not taking: Reported on 03/20/2023) 30 tablet 3   No current facility-administered medications for this encounter.   Wt Readings from Last 3 Encounters:  03/20/23 73.9 kg (163 lb)  08/23/22 74.3 kg (163 lb 12.8 oz)  05/02/22 77.9 kg (171 lb 12.8 oz)   BP 130/88   Pulse 66   Wt 73.9 kg (163 lb)   SpO2 96%   BMI 23.39 kg/m  Physical Exam General:  NAD. No resp difficulty, appears younger than stated age HEENT: Normal Neck: Supple. No JVD. Carotids 2+ bilat; no bruits. No lymphadenopathy or  thryomegaly appreciated. Cor: PMI nondisplaced. Regular rate & rhythm. No rubs, gallops, 3/6 SEM RUSB,  S2 audible Lungs: Clear Abdomen: Soft, nontender, nondistended. No hepatosplenomegaly. No bruits or masses. Good bowel sounds. Extremities: No cyanosis, clubbing, rash, edema Neuro: Alert & oriented x 3, cranial nerves grossly intact. Moves all 4 extremities w/o difficulty. Affect pleasant.  Assessment/Plan:  1. Dyspnea: Suspect symptoms are multifactorial, most worrisome is worsening aortic stenosis. - He is not markedly volume overloaded by exam, but REDs elevated at 39% - Will give Rx for PRN Lasix. BMET and BNP today. 2. CAD: Nonobstructive on prior cath.  Low risk Cardiolite in 1/23.  Has had non-exertional CP recently, he attributes this to anxiety and possible GI issues. ECG without acute ST-T changes today. ? related to worsening aortic stenosis (see #3).  - Continue ASA 81.  - Continue Crestor.  3. Aortic stenosis: Mild on 12/21 echo.  Murmur sounds louder on exam, ? If worsening AS driving his current symptoms. Discussed CT aortic valve calcium score, but upon discussed with Carlean Jews with Structural Heart team, will get echo first and refer to Structural Heart team for further work up. - Could consider him for PROGRESS CAP trial if he is still moderate  4. Hyperlipidemia: Goal LDL < 70 with known CAD.  Myalgias with atorvastatin, now tolerating  Crestor.   - LDL 56 (12/23) 5. Ascending aortic aneurysm: Mild, 4.0 cm on 9/17 echo. MRA chest in 12/18 showed tortuous aorta without frank aneurysmal dilation.   6. Peripheral neuropathy: polyneuropathy of uncertain etiology.  Also with history of aortic stenosis and LVH on last echo. Transthyretin amyloidosis is a concern with this clinical presentation, but PYP scan was not suggestive of TTR cardiac amyloidosis. Myeloma workup also was negative.   Follow up in 4 months with Dr. Kathreen Cornfield [facility] FNP-BC 03/20/2023

## 2023-03-20 NOTE — Patient Instructions (Addendum)
Thank you for coming in today  If you had labs drawn today, any labs that are abnormal the clinic will call you No news is good news  Medications: START Lasix 20 mg as needed For weight gain of 3 lbs in 24 hours or 5 lbs in a week   Follow up appointments:  You will be called and scheduled for a CT Calcium scoring test  Your physician recommends that you schedule a follow-up appointment in:  4 months With Dr. Earlean Shawl will receive a reminder letter in the mail a few months in advance. If you don't receive a letter, please call our office to schedule the follow-up appointment.    Do the following things EVERYDAY: Weigh yourself in the morning before breakfast. Write it down and keep it in a log. Take your medicines as prescribed Eat low salt foods--Limit salt (sodium) to 2000 mg per day.  Stay as active as you can everyday Limit all fluids for the day to less than 2 liters   At the [facility], you and your health needs are our priority. As part of our continuing mission to provide you with exceptional heart care, we have created designated Provider Care Teams. These Care Teams include your primary Cardiologist (physician) and Advanced Practice Providers (APPs- Physician Assistants and Nurse Practitioners) who all work together to provide you with the care you need, when you need it.   You may see any of the following providers on your designated Care Team at your next follow up: Dr Arvilla Meres Dr Marca Ancona Dr. Marcos Eke, NP Robbie Lis, [place] [facility] [place], [place] Brynda Peon, NP Karle Plumber, PharmD   Please be sure to bring in all your medications bottles to every appointment.    Thank you for choosing [facility]  If you have any questions or concerns before your next appointment please send [place] a message through [place] or call our office at [phone].    TO LEAVE  A MESSAGE FOR THE NURSE SELECT OPTION 2, PLEASE LEAVE A MESSAGE INCLUDING: YOUR NAME DATE OF BIRTH CALL BACK NUMBER REASON FOR CALL**this is important as we prioritize the call backs  YOU WILL RECEIVE A CALL BACK THE SAME DAY AS LONG AS YOU CALL BEFORE 4:00 PM

## 2023-03-22 ENCOUNTER — Telehealth (HOSPITAL_COMMUNITY): Payer: Self-pay | Admitting: Family Medicine

## 2023-03-22 NOTE — Telephone Encounter (Signed)
Called Ronny to discuss cancelling his CT and arranged an echo. Left message to call back.

## 2023-03-22 NOTE — Progress Notes (Signed)
Called pt to schedule a echocardiogram, spoke with wife and got it scheduled for 04/09/2023 at 1400. Pt wife verbalized and acknowledged understanding

## 2023-03-22 NOTE — Addendum Note (Signed)
Encounter addended by: Demetrius Charity, RN on: 03/22/2023 10:05 AM  Actions taken: Clinical Note Signed

## 2023-03-22 NOTE — Addendum Note (Signed)
Encounter addended by: Demetrius Charity, RN on: 03/22/2023 8:49 AM  Actions taken: Visit diagnoses modified, Order list changed, Diagnosis association updated

## 2023-04-05 DIAGNOSIS — E7800 Pure hypercholesterolemia, unspecified: Secondary | ICD-10-CM | POA: Diagnosis not present

## 2023-04-05 DIAGNOSIS — I350 Nonrheumatic aortic (valve) stenosis: Secondary | ICD-10-CM | POA: Diagnosis not present

## 2023-04-05 DIAGNOSIS — K219 Gastro-esophageal reflux disease without esophagitis: Secondary | ICD-10-CM | POA: Diagnosis not present

## 2023-04-05 DIAGNOSIS — R739 Hyperglycemia, unspecified: Secondary | ICD-10-CM | POA: Diagnosis not present

## 2023-04-05 DIAGNOSIS — Z23 Encounter for immunization: Secondary | ICD-10-CM | POA: Diagnosis not present

## 2023-04-05 DIAGNOSIS — Z0000 Encounter for general adult medical examination without abnormal findings: Secondary | ICD-10-CM | POA: Diagnosis not present

## 2023-04-05 DIAGNOSIS — I7121 Aneurysm of the ascending aorta, without rupture: Secondary | ICD-10-CM | POA: Diagnosis not present

## 2023-04-05 DIAGNOSIS — G629 Polyneuropathy, unspecified: Secondary | ICD-10-CM | POA: Diagnosis not present

## 2023-04-05 DIAGNOSIS — K9089 Other intestinal malabsorption: Secondary | ICD-10-CM | POA: Diagnosis not present

## 2023-04-05 DIAGNOSIS — I2510 Atherosclerotic heart disease of native coronary artery without angina pectoris: Secondary | ICD-10-CM | POA: Diagnosis not present

## 2023-04-05 DIAGNOSIS — Z79899 Other long term (current) drug therapy: Secondary | ICD-10-CM | POA: Diagnosis not present

## 2023-04-05 DIAGNOSIS — D692 Other nonthrombocytopenic purpura: Secondary | ICD-10-CM | POA: Diagnosis not present

## 2023-04-09 ENCOUNTER — Ambulatory Visit (HOSPITAL_COMMUNITY)
Admission: RE | Admit: 2023-04-09 | Discharge: 2023-04-09 | Disposition: A | Discharge status: Home / Self Care | Payer: Medicare Other | Source: Ambulatory Visit | Attending: Cardiology | Admitting: Cardiology

## 2023-04-09 DIAGNOSIS — I5032 Chronic diastolic (congestive) heart failure: Secondary | ICD-10-CM | POA: Insufficient documentation

## 2023-04-09 DIAGNOSIS — I77819 Aortic ectasia, unspecified site: Secondary | ICD-10-CM | POA: Insufficient documentation

## 2023-04-09 DIAGNOSIS — I25119 Atherosclerotic heart disease of native coronary artery with unspecified angina pectoris: Secondary | ICD-10-CM | POA: Diagnosis not present

## 2023-04-09 DIAGNOSIS — I352 Nonrheumatic aortic (valve) stenosis with insufficiency: Secondary | ICD-10-CM | POA: Insufficient documentation

## 2023-04-09 NOTE — Progress Notes (Signed)
  Echocardiogram 2D Echocardiogram has been performed.  Danny [name] 04/09/2023, 2:58 PM

## 2023-04-10 LAB — ECHOCARDIOGRAM COMPLETE
AR max vel: 1.15 cm2
AV Area VTI: 1.1 cm2
AV Area mean vel: 1.04 cm2
AV Mean grad: 33 mmHg
AV Peak grad: 54.1 mmHg
Ao pk vel: 3.68 m/s
Area-P 1/2: 2.6 cm2
Calc EF: 56.7 %
P 1/2 time: 665 msec
S' Lateral: 3.4 cm
Single Plane A2C EF: 50.1 %
Single Plane A4C EF: 59.7 %

## 2023-04-17 ENCOUNTER — Telehealth (HOSPITAL_COMMUNITY): Payer: Self-pay

## 2023-04-17 ENCOUNTER — Ambulatory Visit: Payer: Medicare Other | Admitting: Internal Medicine

## 2023-04-17 ENCOUNTER — Other Ambulatory Visit (HOSPITAL_COMMUNITY): Payer: Self-pay

## 2023-04-17 ENCOUNTER — Encounter: Payer: Self-pay | Admitting: Internal Medicine

## 2023-04-17 ENCOUNTER — Encounter (HOSPITAL_COMMUNITY): Payer: Self-pay

## 2023-04-17 VITALS — BP 114/86 | HR 66 | Ht 70.0 in | Wt 162.4 lb

## 2023-04-17 DIAGNOSIS — I350 Nonrheumatic aortic (valve) stenosis: Secondary | ICD-10-CM | POA: Diagnosis not present

## 2023-04-17 DIAGNOSIS — I25119 Atherosclerotic heart disease of native coronary artery with unspecified angina pectoris: Secondary | ICD-10-CM

## 2023-04-17 DIAGNOSIS — I509 Heart failure, unspecified: Secondary | ICD-10-CM

## 2023-04-17 DIAGNOSIS — I2510 Atherosclerotic heart disease of native coronary artery without angina pectoris: Secondary | ICD-10-CM | POA: Diagnosis not present

## 2023-04-17 DIAGNOSIS — Z006 Encounter for examination for normal comparison and control in clinical research program: Secondary | ICD-10-CM

## 2023-04-17 NOTE — Telephone Encounter (Signed)
As per Dr. Shirlee Latch spoke with Lance Muss and scheduled catheterization for Friday August 16  arrival time 1PM. Instructions reviewed with her about the procedure. Written instructions mailed to patient's home.

## 2023-04-17 NOTE — Patient Instructions (Signed)
Medication Instructions:  No changes *If you need a refill on your cardiac medications before your next appointment, please call your pharmacy*   Lab Work:  If you have labs (blood work) drawn today and your tests are completely normal, you will receive your results only by: MyChart Message (if you have MyChart) OR A paper copy in the mail If you have any lab test that is abnormal or we need to change your treatment, we will call you to review the results.   Testing/Procedures: Your physician has requested that you have a cardiac catheterization. Cardiac catheterization is used to diagnose and/or treat various heart conditions. Doctors may recommend this procedure for a number of different reasons. The most common reason is to evaluate chest pain. Chest pain can be a symptom of coronary artery disease (CAD), and cardiac catheterization can show whether plaque is narrowing or blocking your heart's arteries. This procedure is also used to evaluate the valves, as well as measure the blood flow and oxygen levels in different parts of your heart. For further information please visit [email or web address] Please follow instruction sheet, as given.    Follow-Up   Other Instructions

## 2023-04-17 NOTE — Telephone Encounter (Signed)
Attempted to reach patient about scheduling catheterization for patient.  Only  day to be scheduled is August 16 in the afternoon

## 2023-04-17 NOTE — Research (Addendum)
 Progress CAP Informed Consent   Subject Name: Danny [name]  Subject met inclusion and exclusion criteria.  The informed consent form, study requirements and expectations were reviewed with the subject and questions and concerns were addressed prior to the signing of the consent form.  The subject verbalized understanding of the trial requirements.  The shared decision making tool was reviewed with the subject. The subject agreed to participate in the Progress CAP trial and signed the informed consent on 04/17/2023.  The informed consent was obtained prior to performance of any protocol-specific procedures for the subject.  A copy of the signed informed consent was given to the subject and a copy was placed in the subject's medical record.   Jovi Zavadil       Progress CAP Screening Visit INCLUSION:  [x]  35 years of age or older at time of randomization  [x]   Moderate AS per one of the following as assessed by the Echo Core Lab:    A: Aortic valve area (AVA) / Aortic valve area index (AVAi):   AVA 1.0 - 1.5 cm2; OR   AVA < 1.0 with AVAi > 0.6 cm2/m2 if BMI < 30 kg/m2; OR   AVA < 1.0 with AVAi > 0.5 cm2/m2 if BMI >= 30 kg/m2, OR   AVA > 1.5 with AVAi < 0.9 cm2/m2 if BMI < 30 kg/m2; OR   AVA > 1.5 with AVAi < 0.8 cm2/m2 if BMI >= 30 kg/m2   AND  Peak velocity 3.0 - < 4.0 m/s OR mean gradient 20 - < 40 mmHg  OR  B: Subjects who only meet one of the criteria in 2A on resting echo due to low flow state (LVEF < 50% or SVI < 35 mL/m2) are eligible if both criteria are met following dobutamine stress echo (DSE). Note: Subjects with inconclusive results from DSE are eligible upon approval by the Case Review Board.  OR  C: Subjects who only meet one of the criteria in 2A on resting echo and have normal flow (LVEF >= 50% and SVi >= 35 mL/m2) are eligible if non-contrast CT calcium  score is < 1200 AU for women or < 2000 AU for men.  [x]   Subject meets at least one of the following criteria:    Valve-related symptoms including [place]  Heart Association functional class >= II, dyspnea, angina, dizziness, pre-syncope, or syncope deemed to be related to AS   LVEF < 60% as assessed by the Echo Core Lab   Diastolic dysfunction (>= Grade 2) as assessed by the Echo Core Lab per American Society of Echocardiography Guidelines1 (i.e., E/e' > 14, left atrium volume index > 34 mL/m2, or tricuspid regurgitation velocity > 2.8 m/sec)   Stroke volume index < 35 mL/m2 as assessed by the Echo Core Lab  Persistent atrial fibrillation or any episode of paroxysmal atrial fibrillation within 6 months prior to randomization   NT-Pro BNP > 3x normal   Elevated calcium  score as assessed by Computed Tomography (CT) Core Lab (> 1200 AU for male and > 2000 AU for male) (only applicable for subjects eligible per 2A or 2B above)  [x]  The subject or subject's legal representative has been informed of the nature of the study, agrees to its provisions, and has provided written informed consent   EXCLUSION []  Native aortic annulus size unsuitable for the THV based on computed tomography angiography (CTA) analysis  []  Anatomical characteristics that would preclude safe placement of the introducer sheath or safe passage of  the delivery system  []  Aortic valve is unicuspid or non-calcified  []  Bicuspid aortic valve with an aneurysmal ascending aorta > 4.5 cm or severe raphe/leaflet calcification as assessed by CT Core Lab  []  Pre-existing mechanical or bioprosthetic aortic valve  []  Severe aortic regurgitation (>3+)  []  Prior balloon aortic valvuloplasty (BAV) to treat severe AS  []  LVEF < 20% as assessed by the Echo Core Lab  []  Left ventricular outflow tract (LVOT) calcification that would increase the risk of annular rupture or significant paravalvular leak (PVL) post-TAVR  []  Cardiac imaging evidence of intracardiac mass, thrombus, or vegetation  []  Coronary or aortic valve anatomy that increases the risk of  coronary artery obstruction post-TAVR  []  Myocardial infarction within 30 days prior to randomization  []  Hypertrophic cardiomyopathy with sub-valvular obstruction or restrictive cardiomyopathy  []  Any concomitant valvular disease requiring surgical or transcatheter intervention.  []  Significant untreated coronary artery disease requiring revascularization  []  Any surgical or transcatheter procedure within 30 days prior to randomization  []  Active bacterial endocarditis within 180 days prior to randomization  []  Stroke or transient ischemic attack (TIA) within 90 days prior to randomization  []  Symptomatic carotid or vertebral artery disease or successful treatment of carotid stenosis within 30 days prior to randomization  []  Severe chronic obstructive pulmonary disease (COPD, Forced Ejection Volume 1 [FEV1] < 50% predicted) or currently on home oxygen  []  Hemodynamic or respiratory instability requiring inotropic or mechanical support within 30 days prior to randomization  []  Liver disease (cirrhosis of the liver [Child-Pugh class B or C])  []  Renal insufficiency (estimated Glomerular Filtration Rate [eGFR] < 30 mL/min/1.73 m2) and/or renal replacement therapy  []  Significant frailty as determined by the Heart Team  []  Leukopenia (White blood cells < 3000 cells/mL), anemia (Hemoglobin < 9 g/dL), thrombocytopenia (platelets < 50,000 cells/mL)  []  Inability to tolerate or condition precluding treatment with antithrombotic therapy (including single antiplatelet therapy)  []  Hypercoagulable state or other condition that increases risk of thrombosis  []  Absolute contraindications or allergy to iodinated contrast that cannot be adequately treated with pre-medication  []  Subject refuses blood products  []  Body mass index (BMI) > 50 kg/m2  []  Estimated life expectancy < 24 months  []  Active SARS-CoV-2 infection (Coronavirus-19 [COVID-19]) or previously diagnosed with COVID-19 with sequelae that could  confound endpoint assessments (as assessed by the Case Review Board)  []  Participating in another drug or device study that has not reached its primary endpoint  []  Subject considered to be part of a vulnerable population     Medications:  Current Outpatient Medications:    acetaminophen  (TYLENOL ) 500 MG tablet, Take 500 mg by mouth every 6 (six) hours as needed., Disp: , Rfl:    aspirin  81 MG tablet, Take 81 mg by mouth daily., Disp: , Rfl:    betamethasone  dipropionate 0.05 % cream, Apply 0.5 application topically as needed., Disp: , Rfl:    Calcium  Citrate-Vitamin D (CITRACAL + D PO), Take 1 tablet by mouth daily., Disp: , Rfl:    Cholecalciferol 50 MCG (2000 UT) CAPS, Take 1,000 Units by mouth in the morning and at bedtime., Disp: , Rfl:    Coenzyme Q10 (CO Q 10 PO), Take 400 mg by mouth daily., Disp: , Rfl:    esomeprazole (NEXIUM) 20 MG capsule, Take 20 mg by mouth daily at 12 noon., Disp: , Rfl:    finasteride  (PROSCAR ) 5 MG tablet, Take 5 mg by mouth daily., Disp: , Rfl:  furosemide  (LASIX ) 20 MG tablet, Take 1 tablet (20 mg total) by mouth as needed (For weight gain of 3 lbs in 24 hours or 5 lbs in a week). For weight gain of 3 lbs in 24 hours or 5 lbs in a week, Disp: 30 tablet, Rfl: 0   gabapentin  (NEURONTIN ) 600 MG tablet, TAKE 1 TABLET AT 4PM AND TAKE 1 TABLET AT BEDTIME., Disp: 180 tablet, Rfl: 3   Magnesium  Oxide 400 MG CAPS, Take 1 capsule (400 mg total) by mouth daily., Disp: 30 capsule, Rfl: 11   metoprolol  succinate (TOPROL  XL) 25 MG 24 hr tablet, Take 1 tablet (25 mg total) by mouth daily., Disp: 30 tablet, Rfl: 3   Multiple Vitamin (MULTIVITAMIN) tablet, Take 1 tablet by mouth daily., Disp: , Rfl:    Multiple Vitamins-Minerals (PRESERVISION AREDS 2) CAPS, 1 capsule, Disp: , Rfl:    nitroGLYCERIN  (NITROSTAT ) 0.4 MG SL tablet, ONE TABLET UNDER TONGUE WHEN NEEDED FOR CHEST PAIN. MAY REPEAT IN 5 MINUTES., Disp: 25 tablet, Rfl: 6   Probiotic Product (PROBIOTIC-10 PO), 1  capsule, Disp: , Rfl:    psyllium (METAMUCIL) 58.6 % packet, Take by mouth., Disp: , Rfl:    RESTASIS  0.05 % ophthalmic emulsion, Place 1 drop into both eyes 2 (two) times daily. , Disp: , Rfl:    rosuvastatin  (CRESTOR ) 5 MG tablet, TAKE 1 TABLET ONCE DAILY., Disp: 90 tablet, Rfl: 0   Saw Palmetto 160 MG CAPS, Take 1 capsule by mouth 2 (two) times daily., Disp: , Rfl:    tamsulosin  (FLOMAX ) 0.4 MG CAPS capsule, Take 0.4 mg by mouth daily., Disp: , Rfl:    traMADol  (ULTRAM ) 50 MG tablet, Take 50 mg by mouth as needed., Disp: , Rfl:    traZODone  (DESYREL ) 50 MG tablet, Take 25 mg by mouth at bedtime as needed., Disp: , Rfl:    Turmeric (QC TUMERIC COMPLEX) 500 MG CAPS, Take 500 mg by mouth daily., Disp: , Rfl:    zolpidem  (AMBIEN ) 10 MG tablet, Take 10 mg by mouth at bedtime as needed for sleep., Disp: , Rfl:     Society of Thoracic Surgeons Score:  STS RISK CALCULATOR:  Procedure Type: Isolated AVR PERIOPERATIVE OUTCOMEESTIMATE % Operative Mortality3.3% Morbidity & Mortality10.8% Stroke1.53% Renal Failure1.67% Reoperation4.38% Prolonged Ventilation3.95% Deep Sternal Wound Infection0.035% Long Hospital Stay (>14 days)4.29% Short Hospital Stay (<6 days)*41.1%   European System for Cardiac Operative Risk Evaluation (EuroSCORE) ll: 1.53%    Surgical Risk assessed by Heart Team:  [x]  - Low []  - Intermediate []  - High []  - Extreme     NHYA CLASS: 2   CCS CLASS: 1     Transthoracic echocardiogram (TTE), including DSE as applicable: 04/09/2023   12- lead electrocardiogram:    Cardiac CTA with 3D within 1 year: Ordered  Labs:  See lab results  KCCQ: See worksheet   [id number]: See worksheet   SF-[id number]:  See worksheet

## 2023-04-17 NOTE — H&P (View-Only) (Signed)
 Patient ID: Danny [name] MRN: [id number] DOB/AGE: 17-Aug-1936 87 y.o.  Primary Care Physician:Stoneking, Hal, MD (Inactive) Primary Cardiologist: Marca Ancona  FOCUSED CARDIOVASCULAR PROBLEM LIST:   1.  Moderate aortic stenosis with an aortic valve area of 1.1 cm2 and mean gradient of 33 mmHg with peak velocity of 3.68 m/s with ejection fraction of 60%; EKG with sinus rhythm with first-degree AV block 2.  Mild to moderate coronary artery disease with cardiac catheterization in 2012 with 30% mid RCA, 40% distal RCA, and 40% proximal LAD disease 3.  Hyperlipidemia 4.  Peripheral neuropathy of unknown etiology   HISTORY OF PRESENT ILLNESS: The patient is a 87 y.o. male with the indicated medical history here for recommendations regarding his aortic valvular disease.  He has been followed by Dr. Shirlee Latch for some time.  He did undergo an evaluation for cardiac amyloid 2021 which was equivocal for transthyretin amyloid and thought to be likely negative.  Serial echocardiograms have shown a preserved ejection fraction however the development of worsening aortic stenosis and associated severe calcification of the aortic valve.  His most recent echocardiogram demonstrated moderate aortic stenosis.  He was seen in cardiology clinic earlier this month with reports of increasing dyspnea.  He was thought to be volume overloaded so as needed Lasix was prescribed.  However there were concerns about his aortic stenosis contributing to his symptomatology.  The patient tells me he has developed increasing shortness of breath specially when [place].  The Lasix did help his peripheral edema and shortness of breath somewhat it did not completely resolve his dyspnea.  This is been more apparent over the last several months.  He also fatigues quite easily and earlier in the day.  He has had some occasional lightheadedness when he goes from sitting to standing quickly.  He denies any exertional  angina.  He fortunately has not required any emergency room visits or hospitalizations.  He is quite concerned about the quality of his life from now as he is relatively active and would like to feel better for a longer period of time.  The patient does not smoke.  He reports excellent dental health.  Past Medical History:  Diagnosis Date   Allergic rhinitis    Arthritis    BPH (benign prostatic hypertrophy)    Dr. Laverle Patter   Cervical disc disease    Coronary artery disease    Patient developed exertional dyspnea. ETT-myoview (8/12) with 11 METS, normal LV EF, normal perfusion at rest and stress but elevated TID ratio. Coronary CT angiogram in 9/12 showed equivocal significant disease in the CFX. Cath in 10/12 showed EF 55-60%, 30% mid RCA, 40% distal RCA, 40% proximal LAD. ETT-Myoview (10/14): Diaphragmatic attenuation, no ischemia, EF 62%, normal study   Detached vitreous humor    Diverticulosis    GERD (gastroesophageal reflux disease)    Hemorrhoids    Impaired fasting glucose    Ingrown toenail    Lumbar radiculopathy    Numbness and tingling in left arm    Onychomycosis    Rotator cuff disorder    Sixth nerve palsy 09/18/1999    Past Surgical History:  Procedure Laterality Date   abnormal stress test     APPENDECTOMY  09/18/1955   BACK SURGERY  6433,2951   lumbar fusion-   CARDIAC CATHETERIZATION     COLONOSCOPY  2017   HEMORRHOID SURGERY  09/18/1963   HERNIA REPAIR  09/17/1994   RIH   LIPOMA EXCISION  09/05/2011  Procedure: EXCISION LIPOMA;  Surgeon: Currie Paris, MD;  Location: [facility];  Service: General;  Laterality: Left;  removal of 4 cm lipoma of back   LUMBAR LAMINECTOMY/DECOMPRESSION MICRODISCECTOMY Right 10/05/2015   Procedure: Extraforaminal Microdiscectomy  - L4-L5 - L5-S1 - right;  Surgeon: Tia Alert, MD;  Location: [facility];  Service: Neurosurgery;  Laterality: Right;  Extraforaminal Microdiscectomy  - L4-L5 - L5-S1 - right    melonoma Right    removed from right arm   ROTATOR CUFF REPAIR  07/17/2007   left   SPINE SURGERY  07/06/10 and 09/07/09   spinal fusion   TONSILLECTOMY AND ADENOIDECTOMY  09/17/1945    Family History  Problem Relation Age of Onset   Cancer Mother        multiple myeloma   Hypertension Mother    Stroke Father    Heart attack Neg Hx     Social History   Socioeconomic History   Marital status: Married    Spouse name: Not on file   Number of children: Not on file   Years of education: Not on file   Highest education level: Not on file  Occupational History   Occupation: retired    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[employer]  Tobacco Use   Smoking status: Never   Smokeless tobacco: Never  Vaping Use   Vaping status: Never Used  Substance and Sexual Activity   Alcohol use: Yes    Comment: occasionally   Drug use: No   Sexual activity: Not on file  Other Topics Concern   Not on file  Social History Narrative   Right handed   Lives with wife two story home   Social Determinants of Health   Financial Resource Strain: Not on file  Food Insecurity: Low Risk  (12/06/2022)   [facility]   Food vital sign    Within the past 12 months, you worried that your food would run out before you got money to buy more: Never true    Within the past 12 months, the food you bought just didn't last and you didn't have money to get more. : Never true  Transportation Needs: No Transportation Needs (12/06/2022)   [facility]   Transportation    In the past 12 months, has lack of reliable transportation kept you from medical appointments, meetings, work or from getting things needed for daily living? : No  Physical Activity: Not on file  Stress: Not on file  Social Connections: Not on file  Intimate Partner Violence: Not on file     Prior to Admission medications   Medication Sig Start Date End Date  Taking? Authorizing Provider  acetaminophen (TYLENOL) 500 MG tablet Take 500 mg by mouth every 6 (six) hours as needed.    [provider]  aspirin 81 MG tablet Take 81 mg by mouth daily.    [provider]  betamethasone dipropionate 0.05 % cream Apply 0.5 application topically as needed.    [provider]  Calcium Citrate-Vitamin D (CITRACAL + D PO) Take 1 tablet by mouth daily.    [provider]  Cholecalciferol 50 MCG (2000 UT) CAPS Take 1,000 Units by mouth in the morning and at bedtime.    [provider]  Coenzyme Q10 (CO Q 10 PO) Take 400 mg by mouth daily.    [provider]  docusate sodium (  COLACE) 100 MG capsule Take 100 mg by mouth 2 (two) times daily.    [provider]  esomeprazole (NEXIUM) 20 MG capsule Take 20 mg by mouth daily at 12 noon.    [provider]  finasteride (PROSCAR) 5 MG tablet Take 5 mg by mouth daily. 06/15/21   [provider]  furosemide (LASIX) 20 MG tablet Take 1 tablet (20 mg total) by mouth as needed (For weight gain of 3 lbs in 24 hours or 5 lbs in a week). For weight gain of 3 lbs in 24 hours or 5 lbs in a week 03/20/23 06/18/23  Jacklynn Ganong, FNP  gabapentin (NEURONTIN) 600 MG tablet TAKE 1 TABLET AT 4PM AND TAKE 1 TABLET AT BEDTIME. 05/02/22   Drema Dallas, DO  Magnesium Oxide 400 MG CAPS Take 1 capsule (400 mg total) by mouth daily. 05/18/16   Laurey Morale, MD  metoprolol succinate (TOPROL XL) 25 MG 24 hr tablet Take 1 tablet (25 mg total) by mouth daily. Patient not taking: Reported on 03/20/2023 11/02/22   Laurey Morale, MD  Multiple Vitamin (MULTIVITAMIN) tablet Take 1 tablet by mouth daily.    [provider]  Multiple Vitamins-Minerals (PRESERVISION AREDS 2) CAPS 1 capsule    [provider]  nitroGLYCERIN (NITROSTAT) 0.4 MG SL tablet ONE TABLET UNDER TONGUE WHEN NEEDED FOR CHEST PAIN. MAY REPEAT IN 5 MINUTES. 12/13/22   Bensimhon, Bevelyn Buckles, MD   Probiotic Product (PROBIOTIC-10 PO) 1 capsule    [provider]  psyllium (METAMUCIL) 58.6 % packet Take by mouth.    [provider]  RESTASIS 0.05 % ophthalmic emulsion Place 1 drop into both eyes 2 (two) times daily.  03/11/13   [provider]  rosuvastatin (CRESTOR) 5 MG tablet TAKE 1 TABLET ONCE DAILY. 08/15/20   Laurey Morale, MD  Saw Palmetto 160 MG CAPS Take 1 capsule by mouth 2 (two) times daily.    [provider]  tamsulosin (FLOMAX) 0.4 MG CAPS capsule Take 0.4 mg by mouth daily.    [provider]  traMADol (ULTRAM) 50 MG tablet Take 50 mg by mouth as needed. 07/11/21   [provider]  traZODone (DESYREL) 50 MG tablet Take 25 mg by mouth at bedtime as needed. 05/12/20   [provider]  Turmeric (QC TUMERIC COMPLEX) 500 MG CAPS Take 500 mg by mouth daily.    [provider]  zolpidem (AMBIEN) 10 MG tablet Take 10 mg by mouth at bedtime as needed for sleep.    [provider]    No Known Allergies  REVIEW OF SYSTEMS:  General: no fevers/chills/night sweats Eyes: no blurry vision, diplopia, or amaurosis ENT: no sore throat or hearing loss Resp: no cough, wheezing, or hemoptysis CV: no edema or palpitations GI: no abdominal pain, nausea, vomiting, diarrhea, or constipation GU: no dysuria, frequency, or hematuria Skin: no rash Neuro: no headache, numbness, tingling, or weakness of extremities Musculoskeletal: no joint pain or swelling Heme: no bleeding, DVT, or easy bruising Endo: no polydipsia or polyuria  BP 114/86   Pulse 66   Ht 5\' 10"  (1.778 m)   Wt 162 lb 6.4 oz (73.7 kg)   SpO2 96%   BMI 23.30 kg/m   PHYSICAL EXAM: GEN:  AO x 3 in no acute distress HEENT: normal Dentition: Normal Neck: JVP normal. +2 carotid upstrokes without bruits. No thyromegaly. Lungs: equal expansion, clear bilaterally CV: Apex is discrete and nondisplaced, RRR with 3/6  SEM Abd: soft, non-tender,  non-distended; no bruit; positive bowel sounds Ext: no edema, ecchymoses, or cyanosis Vascular: 2+ femoral pulses, 2+ radial pulses       Skin: warm and dry without rash Neuro: CN II-XII grossly intact; motor and sensory grossly intact    DATA AND STUDIES:   EKG Interpretation Date/Time:  Wednesday April 17 2023 13:10:46 EDT Ventricular Rate:  62 PR Interval:  244 QRS Duration:  86 QT Interval:  388 QTC Calculation: 393 R Axis:   33  Text Interpretation: Sinus rhythm with 1st degree A-V block When compared with ECG of 17-Apr-2023 13:03, Left posterior fascicular block is no longer Present Confirmed by Alverda Skeans ([id number]) on 04/17/2023 1:12:21 PM    Cardiac Studies & Procedures     STRESS TESTS  MYOCARDIAL PERFUSION IMAGING 09/20/2021  Narrative   Findings are consistent with no prior ischemia and no prior myocardial infarction. The study is low risk.   No ST deviation was noted.   Left ventricular function is normal. Nuclear stress EF: 68 %. The left ventricular ejection fraction is hyperdynamic (>65%). End diastolic cavity size is normal.   Prior study available for comparison from 03/15/2015.  Findings: Negative for stress induced arrhythmias. No evidence of ischemia or infarction.  Conclusions: Stress test is negative. Low risk study. Less artifact that 2016 study, no other significant change.   ECHOCARDIOGRAM  ECHOCARDIOGRAM COMPLETE 04/09/2023  Narrative ECHOCARDIOGRAM REPORT    Patient Name:   Danny [name] Date of Exam: 04/09/2023 Medical Rec #:  [id number]         Height:       70.0 in Accession #:    [id number]        Weight:       163.0 lb Date of Birth:  [birth year]         BSA:          1.914 m Patient Age:    87 years          BP:           151/83 mmHg Patient Gender: M                 HR:           58 bpm. Exam Location:  Inpatient  Procedure: 2D Echo, 3D Echo, Cardiac Doppler, Color Doppler and Strain Analysis  Indications:    I50.40*  Unspecified combined systolic (congestive) and diastolic (congestive) heart failure; I35.0 Nonrheumatic aortic (valve) stenosis  History:        Patient has prior history of Echocardiogram examinations, most recent 09/05/2022. CAD, Aortic Valve Disease; Signs/Symptoms:Shortness of Breath, Dyspnea, Chest Pain and Murmur. Aortic stenosis.  Sonographer:    Sheralyn Boatman RDCS Referring Phys: [id number] DALTON S MCLEAN  IMPRESSIONS   1. Left ventricular ejection fraction, by estimation, is 60 to 65%. Left ventricular ejection fraction by 3D volume is 60 %. The left ventricle has normal function. The left ventricle has no regional wall motion abnormalities. Left ventricular diastolic parameters were normal. The average left ventricular global longitudinal strain is -20.6 %. The global longitudinal strain is normal. 2. Right ventricular systolic function is normal. The right ventricular size is normal. There is normal pulmonary artery systolic pressure. The estimated right ventricular systolic pressure is 18.5 mmHg. 3. The mitral valve is grossly normal. Trivial mitral valve regurgitation. No evidence of mitral stenosis. 4. The aortic valve is tricuspid. There is severe calcifcation of the aortic valve. Aortic valve regurgitation is mild.  Moderate aortic valve stenosis. Aortic valve area, by VTI measures 1.10 cm. Aortic valve mean gradient measures 33.0 mmHg. Aortic valve Vmax measures 3.68 m/s. 5. Aortic dilatation noted. There is borderline dilatation of the ascending aorta, measuring 40 mm. 6. The inferior vena cava is normal in size with greater than 50% respiratory variability, suggesting right atrial pressure of 3 mmHg.  FINDINGS Left Ventricle: Left ventricular ejection fraction, by estimation, is 60 to 65%. Left ventricular ejection fraction by 3D volume is 60 %. The left ventricle has normal function. The left ventricle has no regional wall motion abnormalities. The average left ventricular global  longitudinal strain is -20.6 %. The global longitudinal strain is normal. The global longitudinal strain is normal despite suboptimal segment tracking. The left ventricular internal cavity size was normal in size. There is borderline left ventricular hypertrophy. Left ventricular diastolic parameters were normal.  Right Ventricle: The right ventricular size is normal. No increase in right ventricular wall thickness. Right ventricular systolic function is normal. There is normal pulmonary artery systolic pressure. The tricuspid regurgitant velocity is 1.97 m/s, and with an assumed right atrial pressure of 3 mmHg, the estimated right ventricular systolic pressure is 18.5 mmHg.  Left [facility]: Left atrial size was normal in size.  Right [facility]: Right atrial size was normal in size.  Pericardium: There is no evidence of pericardial effusion.  Mitral Valve: The mitral valve is grossly normal. Trivial mitral valve regurgitation. No evidence of mitral valve stenosis.  Tricuspid Valve: The tricuspid valve is normal in structure. Tricuspid valve regurgitation is trivial.  Aortic Valve: The aortic valve is tricuspid. There is severe calcifcation of the aortic valve. Aortic valve regurgitation is mild. Aortic regurgitation PHT measures 665 msec. Moderate aortic stenosis is present. Aortic valve mean gradient measures 33.0 mmHg. Aortic valve peak gradient measures 54.1 mmHg. Aortic valve area, by VTI measures 1.10 cm.  Pulmonic Valve: The pulmonic valve was normal in structure. Pulmonic valve regurgitation is trivial.  Aorta: Aortic dilatation noted. There is borderline dilatation of the ascending aorta, measuring 40 mm.  Venous: The inferior vena cava is normal in size with greater than 50% respiratory variability, suggesting right atrial pressure of 3 mmHg.  IAS/Shunts: The interatrial septum was not well visualized.   LEFT VENTRICLE PLAX 2D LVIDd:         4.70 cm         Diastology LVIDs:          3.40 cm         LV e' medial:    7.29 cm/s LV PW:         1.00 cm         LV E/e' medial:  11.8 LV IVS:        1.00 cm         LV e' lateral:   8.49 cm/s LVOT diam:     2.20 cm         LV E/e' lateral: 10.1 LV SV:         98 LV SV Index:   51              2D LVOT Area:     3.80 cm        Longitudinal Strain 2D Strain GLS  -20.6 % LV Volumes (MOD)               Avg: LV vol d, MOD    80.1 ml A2C:  3D Volume EF LV vol d, MOD    86.1 ml       LV 3D EF:    Left A4C:                                        ventricul LV vol s, MOD    40.0 ml                    ar A2C:                                        ejection LV vol s, MOD    34.7 ml                    fraction A4C:                                        by 3D LV SV MOD A2C:   40.1 ml                    volume is LV SV MOD A4C:   86.1 ml                    60 %. LV SV MOD BP:    48.2 ml  3D Volume EF: 3D EF:        60 % LV EDV:       118 ml LV ESV:       47 ml LV SV:        71 ml  RIGHT VENTRICLE             IVC RV S prime:     78.75 cm/s  IVC diam: 2.00 cm TAPSE (M-mode): 1.8 cm  LEFT ATRIUM             Index        RIGHT ATRIUM           Index LA diam:        3.80 cm 1.99 cm/m   RA Area:     11.70 cm LA Vol (A2C):   54.6 ml 28.53 ml/m  RA Volume:   19.80 ml  10.35 ml/m LA Vol (A4C):   34.1 ml 17.82 ml/m LA Biplane Vol: 45.5 ml 23.78 ml/m AORTIC VALVE                     PULMONIC VALVE AV Area (Vmax):    1.15 cm      PR End Diast Vel: 1.48 msec AV Area (Vmean):   1.04 cm AV Area (VTI):     1.10 cm AV Vmax:           367.80 cm/s AV Vmean:          257.800 cm/s AV VTI:            0.895 m AV Peak Grad:      54.1 mmHg AV Mean Grad:      33.0 mmHg LVOT Vmax:         111.00 cm/s LVOT Vmean:        70.600 cm/s LVOT VTI:  0.258 m LVOT/AV VTI ratio: 0.29 AI PHT:            665 msec  AORTA Ao Root diam: 3.70 cm Ao Asc diam:  4.00 cm  MITRAL VALVE               TRICUSPID  VALVE MV Area (PHT): 2.60 cm    TR Peak grad:   15.5 mmHg MV Decel Time: 292 msec    TR Vmax:        197.00 cm/s MV E velocity: 85.90 cm/s MV A velocity: 85.90 cm/s  SHUNTS MV E/A ratio:  1.00        Systemic VTI:  0.26 m Systemic Diam: 2.20 cm  Weston Brass MD Electronically signed by Weston Brass MD Signature Date/Time: 04/10/2023/10:42:17 PM    Final     CT SCANS  CT CORONARY MORPH W/CTA COR W/SCORE 06/20/2011  Narrative **ADDENDUM** CREATED: 06/21/2011 08:45:41  OVER-READ INTERPRETATION - CT CHEST  The following report is an over-read performed by radiologist Dr. Charline Bills, M.D. of [facility] on 06/21/2011 08:45:41.  This over-read does not include interpretation of cardiac or coronary anatomy or pathology.  The CTA interpretation by the cardiologist is attached.  Comparison:  Chest radiograph dated 09/02/2009.  Findings: Mild emphysematous changes.  Mild dependent atelectasis in the bilateral lower lobes.  No suspicious pulmonary nodules.  No pleural effusion or pneumothorax.  No suspicious mediastinal or hilar lymphadenopathy.  Mild degenerate changes of the visualized thoracic spine.  IMPRESSION: Mild emphysematous changes.  **END ADDENDUM** SIGNED BY: Charline Bills, M.D. Original Report Authenticated By: Charline Bills, M.D.          03/20/2023: B Natriuretic Peptide 118.8; BUN 11; Creatinine, Ser 0.76; Hemoglobin 14.3; Platelets 214; Potassium 3.9; Sodium 137   STS RISK CALCULATOR: Pending  NHYA CLASS: 2  CCS CLASS: 1  ASSESSMENT AND PLAN:   Nonrheumatic aortic valve stenosis - Plan: EKG 12-Lead  Coronary artery disease involving native coronary artery of native heart without angina pectoris  The patient has developed NYHA class II symptoms of dyspnea without exertional angina.  He has a heavily calcified valve with restricted motion with indices that indicate moderate aortic stenosis.  Given his lifestyle limiting  dyspnea, will refer patient for inclusion in the PROGRESS CAP program.  I will refer the patient for coronary angiography and right heart catheterization with Dr. Shirlee Latch, TAVR protocol CTA, and cardiothoracic surgical opinion.  Further recommendations will be provided once all imaging studies have been reviewed.  I have personally reviewed the patients imaging data as summarized above.  I have reviewed the natural history of aortic stenosis with the patient and family members who are present today. We have discussed the limitations of medical therapy and the poor prognosis associated with symptomatic aortic stenosis. We have also reviewed potential treatment options, including palliative medical therapy, conventional surgical aortic valve replacement, and transcatheter aortic valve replacement. We discussed treatment options in the context of this patient's specific comorbid medical conditions.   All of the patient's questions were answered today. Will make further recommendations based on the results of studies outlined above.   Total time spent with patient today 60 minutes. This includes reviewing records, evaluating the patient and coordinating care.   Orbie Pyo, MD  04/17/2023 2:39 PM    [facility] [address] [place], [place], [place]  [address] Phone: [phone]; Fax: [phone]

## 2023-04-17 NOTE — Telephone Encounter (Signed)
Patients wife returning your phone call- thank you!

## 2023-04-17 NOTE — Progress Notes (Addendum)
Patient ID: Danny [name] MRN: [id number] DOB/AGE: April 19, 1936 87 y.o.  Primary Care Physician:Stoneking, Hal, MD (Inactive) Primary Cardiologist: Marca Ancona  FOCUSED CARDIOVASCULAR PROBLEM LIST:   1.  Moderate aortic stenosis with an aortic valve area of 1.1 cm2 and mean gradient of 33 mmHg with peak velocity of 3.68 m/s with ejection fraction of 60%; EKG with sinus rhythm with first-degree AV block 2.  Mild to moderate coronary artery disease with cardiac catheterization in 2012 with 30% mid RCA, 40% distal RCA, and 40% proximal LAD disease 3.  Hyperlipidemia 4.  Peripheral neuropathy of unknown etiology   HISTORY OF PRESENT ILLNESS: The patient is a 87 y.o. male with the indicated medical history here for recommendations regarding his aortic valvular disease.  He has been followed by Dr. Shirlee Latch for some time.  He did undergo an evaluation for cardiac amyloid 2021 which was equivocal for transthyretin amyloid and thought to be likely negative.  Serial echocardiograms have shown a preserved ejection fraction however the development of worsening aortic stenosis and associated severe calcification of the aortic valve.  His most recent echocardiogram demonstrated moderate aortic stenosis.  He was seen in cardiology clinic earlier this month with reports of increasing dyspnea.  He was thought to be volume overloaded so as needed Lasix was prescribed.  However there were concerns about his aortic stenosis contributing to his symptomatology.  The patient tells me he has developed increasing shortness of breath specially when [place] the [place].  The Lasix did help his peripheral edema and shortness of breath somewhat it did not completely resolve his dyspnea.  This is been more apparent over the last several months.  He also fatigues quite easily and earlier in the day.  He has had some occasional lightheadedness when he goes from sitting to standing quickly.  He denies any exertional  angina.  He fortunately has not required any emergency room visits or hospitalizations.  He is quite concerned about the quality of his life from now as he is relatively active and would like to feel better for a longer period of time.  The patient does not smoke.  He reports excellent dental health.  Past Medical History:  Diagnosis Date   Allergic rhinitis    Arthritis    BPH (benign prostatic hypertrophy)    Dr. Laverle Patter   Cervical disc disease    Coronary artery disease    Patient developed exertional dyspnea. ETT-myoview (8/12) with 11 METS, normal LV EF, normal perfusion at rest and stress but elevated TID ratio. Coronary CT angiogram in 9/12 showed equivocal significant disease in the CFX. Cath in 10/12 showed EF 55-60%, 30% mid RCA, 40% distal RCA, 40% proximal LAD. ETT-Myoview (10/14): Diaphragmatic attenuation, no ischemia, EF 62%, normal study   Detached vitreous humor    Diverticulosis    GERD (gastroesophageal reflux disease)    Hemorrhoids    Impaired fasting glucose    Ingrown toenail    Lumbar radiculopathy    Numbness and tingling in left arm    Onychomycosis    Rotator cuff disorder    Sixth nerve palsy 09/18/1999    Past Surgical History:  Procedure Laterality Date   abnormal stress test     APPENDECTOMY  09/18/1955   BACK SURGERY  8416,6063   lumbar fusion-   CARDIAC CATHETERIZATION     COLONOSCOPY  2017   HEMORRHOID SURGERY  09/18/1963   HERNIA REPAIR  09/17/1994   RIH   LIPOMA EXCISION  09/05/2011  Procedure: EXCISION LIPOMA;  Surgeon: Currie Paris, MD;  Location: [facility];  Service: General;  Laterality: Left;  removal of 4 cm lipoma of back   LUMBAR LAMINECTOMY/DECOMPRESSION MICRODISCECTOMY Right 10/05/2015   Procedure: Extraforaminal Microdiscectomy  - L4-L5 - L5-S1 - right;  Surgeon: Tia Alert, MD;  Location: [facility];  Service: Neurosurgery;  Laterality: Right;  Extraforaminal Microdiscectomy  - L4-L5 - L5-S1 - right    melonoma Right    removed from right arm   ROTATOR CUFF REPAIR  07/17/2007   left   SPINE SURGERY  07/06/10 and 09/07/09   spinal fusion   TONSILLECTOMY AND ADENOIDECTOMY  09/17/1945    Family History  Problem Relation Age of Onset   Cancer Mother        multiple myeloma   Hypertension Mother    Stroke Father    Heart attack Neg Hx     Social History   Socioeconomic History   Marital status: Married    Spouse name: Not on file   Number of children: Not on file   Years of education: Not on file   Highest education level: Not on file  Occupational History   Occupation: retired    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[place] [employer]  Tobacco Use   Smoking status: Never   Smokeless tobacco: Never  Vaping Use   Vaping status: Never Used  Substance and Sexual Activity   Alcohol use: Yes    Comment: occasionally   Drug use: No   Sexual activity: Not on file  Other Topics Concern   Not on file  Social History Narrative   Right handed   Lives with wife two story home   Social Determinants of Health   Financial Resource Strain: Not on file  Food Insecurity: Low Risk  (12/06/2022)   [facility]   Food vital sign    Within the past 12 months, you worried that your food would run out before you got money to buy more: Never true    Within the past 12 months, the food you bought just didn't last and you didn't have money to get more. : Never true  Transportation Needs: No Transportation Needs (12/06/2022)   [facility]   Transportation    In the past 12 months, has lack of reliable transportation kept you from medical appointments, meetings, work or from getting things needed for daily living? : No  Physical Activity: Not on file  Stress: Not on file  Social Connections: Not on file  Intimate Partner Violence: Not on file     Prior to Admission medications   Medication Sig Start Date End Date  Taking? Authorizing Provider  acetaminophen (TYLENOL) 500 MG tablet Take 500 mg by mouth every 6 (six) hours as needed.    [provider]  aspirin 81 MG tablet Take 81 mg by mouth daily.    [provider]  betamethasone dipropionate 0.05 % cream Apply 0.5 application topically as needed.    [provider]  Calcium Citrate-Vitamin D (CITRACAL + D PO) Take 1 tablet by mouth daily.    [provider]  Cholecalciferol 50 MCG (2000 UT) CAPS Take 1,000 Units by mouth in the morning and at bedtime.    [provider]  Coenzyme Q10 (CO Q 10 PO) Take 400 mg by mouth daily.    [provider]  docusate sodium (  COLACE) 100 MG capsule Take 100 mg by mouth 2 (two) times daily.    [provider]  esomeprazole (NEXIUM) 20 MG capsule Take 20 mg by mouth daily at 12 noon.    [provider]  finasteride (PROSCAR) 5 MG tablet Take 5 mg by mouth daily. 06/15/21   [provider]  furosemide (LASIX) 20 MG tablet Take 1 tablet (20 mg total) by mouth as needed (For weight gain of 3 lbs in 24 hours or 5 lbs in a week). For weight gain of 3 lbs in 24 hours or 5 lbs in a week 03/20/23 06/18/23  Jacklynn Ganong, FNP  gabapentin (NEURONTIN) 600 MG tablet TAKE 1 TABLET AT 4PM AND TAKE 1 TABLET AT BEDTIME. 05/02/22   Drema Dallas, DO  Magnesium Oxide 400 MG CAPS Take 1 capsule (400 mg total) by mouth daily. 05/18/16   Laurey Morale, MD  metoprolol succinate (TOPROL XL) 25 MG 24 hr tablet Take 1 tablet (25 mg total) by mouth daily. Patient not taking: Reported on 03/20/2023 11/02/22   Laurey Morale, MD  Multiple Vitamin (MULTIVITAMIN) tablet Take 1 tablet by mouth daily.    [provider]  Multiple Vitamins-Minerals (PRESERVISION AREDS 2) CAPS 1 capsule    [provider]  nitroGLYCERIN (NITROSTAT) 0.4 MG SL tablet ONE TABLET UNDER TONGUE WHEN NEEDED FOR CHEST PAIN. MAY REPEAT IN 5 MINUTES. 12/13/22   Bensimhon, Bevelyn Buckles, MD   Probiotic Product (PROBIOTIC-10 PO) 1 capsule    [provider]  psyllium (METAMUCIL) 58.6 % packet Take by mouth.    [provider]  RESTASIS 0.05 % ophthalmic emulsion Place 1 drop into both eyes 2 (two) times daily.  03/11/13   [provider]  rosuvastatin (CRESTOR) 5 MG tablet TAKE 1 TABLET ONCE DAILY. 08/15/20   Laurey Morale, MD  Saw Palmetto 160 MG CAPS Take 1 capsule by mouth 2 (two) times daily.    [provider]  tamsulosin (FLOMAX) 0.4 MG CAPS capsule Take 0.4 mg by mouth daily.    [provider]  traMADol (ULTRAM) 50 MG tablet Take 50 mg by mouth as needed. 07/11/21   [provider]  traZODone (DESYREL) 50 MG tablet Take 25 mg by mouth at bedtime as needed. 05/12/20   [provider]  Turmeric (QC TUMERIC COMPLEX) 500 MG CAPS Take 500 mg by mouth daily.    [provider]  zolpidem (AMBIEN) 10 MG tablet Take 10 mg by mouth at bedtime as needed for sleep.    [provider]    No Known Allergies  REVIEW OF SYSTEMS:  General: no fevers/chills/night sweats Eyes: no blurry vision, diplopia, or amaurosis ENT: no sore throat or hearing loss Resp: no cough, wheezing, or hemoptysis CV: no edema or palpitations GI: no abdominal pain, nausea, vomiting, diarrhea, or constipation GU: no dysuria, frequency, or hematuria Skin: no rash Neuro: no headache, numbness, tingling, or weakness of extremities Musculoskeletal: no joint pain or swelling Heme: no bleeding, DVT, or easy bruising Endo: no polydipsia or polyuria  BP 114/86   Pulse 66   Ht 5\' 10"  (1.778 m)   Wt 162 lb 6.4 oz (73.7 kg)   SpO2 96%   BMI 23.30 kg/m   PHYSICAL EXAM: GEN:  AO x 3 in no acute distress HEENT: normal Dentition: Normal Neck: JVP normal. +2 carotid upstrokes without bruits. No thyromegaly. Lungs: equal expansion, clear bilaterally CV: Apex is discrete and nondisplaced, RRR with 3/6  SEM Abd: soft, non-tender,  non-distended; no bruit; positive bowel sounds Ext: no edema, ecchymoses, or cyanosis Vascular: 2+ femoral pulses, 2+ radial pulses       Skin: warm and dry without rash Neuro: CN II-XII grossly intact; motor and sensory grossly intact    DATA AND STUDIES:   EKG Interpretation Date/Time:  Wednesday April 17 2023 13:10:46 EDT Ventricular Rate:  62 PR Interval:  244 QRS Duration:  86 QT Interval:  388 QTC Calculation: 393 R Axis:   33  Text Interpretation: Sinus rhythm with 1st degree A-V block When compared with ECG of 17-Apr-2023 13:03, Left posterior fascicular block is no longer Present Confirmed by Alverda Skeans ([id number]) on 04/17/2023 1:12:21 PM    Cardiac Studies & Procedures     STRESS TESTS  MYOCARDIAL PERFUSION IMAGING 09/20/2021  Narrative   Findings are consistent with no prior ischemia and no prior myocardial infarction. The study is low risk.   No ST deviation was noted.   Left ventricular function is normal. Nuclear stress EF: 68 %. The left ventricular ejection fraction is hyperdynamic (>65%). End diastolic cavity size is normal.   Prior study available for comparison from 03/15/2015.  Findings: Negative for stress induced arrhythmias. No evidence of ischemia or infarction.  Conclusions: Stress test is negative. Low risk study. Less artifact that 2016 study, no other significant change.   ECHOCARDIOGRAM  ECHOCARDIOGRAM COMPLETE 04/09/2023  Narrative ECHOCARDIOGRAM REPORT    Patient Name:   Danny [name] Date of Exam: 04/09/2023 Medical Rec #:  [id number]         Height:       70.0 in Accession #:    [id number]        Weight:       163.0 lb Date of Birth:  [birth year]         BSA:          1.914 m Patient Age:    87 years          BP:           151/83 mmHg Patient Gender: M                 HR:           58 bpm. Exam Location:  Inpatient  Procedure: 2D Echo, 3D Echo, Cardiac Doppler, Color Doppler and Strain Analysis  Indications:    I50.40*  Unspecified combined systolic (congestive) and diastolic (congestive) heart failure; I35.0 Nonrheumatic aortic (valve) stenosis  History:        Patient has prior history of Echocardiogram examinations, most recent 09/05/2022. CAD, Aortic Valve Disease; Signs/Symptoms:Shortness of Breath, Dyspnea, Chest Pain and Murmur. Aortic stenosis.  Sonographer:    Sheralyn Boatman RDCS Referring Phys: [id number] DALTON S MCLEAN  IMPRESSIONS   1. Left ventricular ejection fraction, by estimation, is 60 to 65%. Left ventricular ejection fraction by 3D volume is 60 %. The left ventricle has normal function. The left ventricle has no regional wall motion abnormalities. Left ventricular diastolic parameters were normal. The average left ventricular global longitudinal strain is -20.6 %. The global longitudinal strain is normal. 2. Right ventricular systolic function is normal. The right ventricular size is normal. There is normal pulmonary artery systolic pressure. The estimated right ventricular systolic pressure is 18.5 mmHg. 3. The mitral valve is grossly normal. Trivial mitral valve regurgitation. No evidence of mitral stenosis. 4. The aortic valve is tricuspid. There is severe calcifcation of the aortic valve. Aortic valve regurgitation is mild.  Moderate aortic valve stenosis. Aortic valve area, by VTI measures 1.10 cm. Aortic valve mean gradient measures 33.0 mmHg. Aortic valve Vmax measures 3.68 m/s. 5. Aortic dilatation noted. There is borderline dilatation of the ascending aorta, measuring 40 mm. 6. The inferior vena cava is normal in size with greater than 50% respiratory variability, suggesting right atrial pressure of 3 mmHg.  FINDINGS Left Ventricle: Left ventricular ejection fraction, by estimation, is 60 to 65%. Left ventricular ejection fraction by 3D volume is 60 %. The left ventricle has normal function. The left ventricle has no regional wall motion abnormalities. The average left ventricular global  longitudinal strain is -20.6 %. The global longitudinal strain is normal. The global longitudinal strain is normal despite suboptimal segment tracking. The left ventricular internal cavity size was normal in size. There is borderline left ventricular hypertrophy. Left ventricular diastolic parameters were normal.  Right Ventricle: The right ventricular size is normal. No increase in right ventricular wall thickness. Right ventricular systolic function is normal. There is normal pulmonary artery systolic pressure. The tricuspid regurgitant velocity is 1.97 m/s, and with an assumed right atrial pressure of 3 mmHg, the estimated right ventricular systolic pressure is 18.5 mmHg.  Left [facility]: Left atrial size was normal in size.  Right [facility]: Right atrial size was normal in size.  Pericardium: There is no evidence of pericardial effusion.  Mitral Valve: The mitral valve is grossly normal. Trivial mitral valve regurgitation. No evidence of mitral valve stenosis.  Tricuspid Valve: The tricuspid valve is normal in structure. Tricuspid valve regurgitation is trivial.  Aortic Valve: The aortic valve is tricuspid. There is severe calcifcation of the aortic valve. Aortic valve regurgitation is mild. Aortic regurgitation PHT measures 665 msec. Moderate aortic stenosis is present. Aortic valve mean gradient measures 33.0 mmHg. Aortic valve peak gradient measures 54.1 mmHg. Aortic valve area, by VTI measures 1.10 cm.  Pulmonic Valve: The pulmonic valve was normal in structure. Pulmonic valve regurgitation is trivial.  Aorta: Aortic dilatation noted. There is borderline dilatation of the ascending aorta, measuring 40 mm.  Venous: The inferior vena cava is normal in size with greater than 50% respiratory variability, suggesting right atrial pressure of 3 mmHg.  IAS/Shunts: The interatrial septum was not well visualized.   LEFT VENTRICLE PLAX 2D LVIDd:         4.70 cm         Diastology LVIDs:          3.40 cm         LV e' medial:    7.29 cm/s LV PW:         1.00 cm         LV E/e' medial:  11.8 LV IVS:        1.00 cm         LV e' lateral:   8.49 cm/s LVOT diam:     2.20 cm         LV E/e' lateral: 10.1 LV SV:         98 LV SV Index:   51              2D LVOT Area:     3.80 cm        Longitudinal Strain 2D Strain GLS  -20.6 % LV Volumes (MOD)               Avg: LV vol d, MOD    80.1 ml A2C:  3D Volume EF LV vol d, MOD    86.1 ml       LV 3D EF:    Left A4C:                                        ventricul LV vol s, MOD    40.0 ml                    ar A2C:                                        ejection LV vol s, MOD    34.7 ml                    fraction A4C:                                        by 3D LV SV MOD A2C:   40.1 ml                    volume is LV SV MOD A4C:   86.1 ml                    60 %. LV SV MOD BP:    48.2 ml  3D Volume EF: 3D EF:        60 % LV EDV:       118 ml LV ESV:       47 ml LV SV:        71 ml  RIGHT VENTRICLE             IVC RV S prime:     78.75 cm/s  IVC diam: 2.00 cm TAPSE (M-mode): 1.8 cm  LEFT ATRIUM             Index        RIGHT ATRIUM           Index LA diam:        3.80 cm 1.99 cm/m   RA Area:     11.70 cm LA Vol (A2C):   54.6 ml 28.53 ml/m  RA Volume:   19.80 ml  10.35 ml/m LA Vol (A4C):   34.1 ml 17.82 ml/m LA Biplane Vol: 45.5 ml 23.78 ml/m AORTIC VALVE                     PULMONIC VALVE AV Area (Vmax):    1.15 cm      PR End Diast Vel: 1.48 msec AV Area (Vmean):   1.04 cm AV Area (VTI):     1.10 cm AV Vmax:           367.80 cm/s AV Vmean:          257.800 cm/s AV VTI:            0.895 m AV Peak Grad:      54.1 mmHg AV Mean Grad:      33.0 mmHg LVOT Vmax:         111.00 cm/s LVOT Vmean:        70.600 cm/s LVOT VTI:  0.258 m LVOT/AV VTI ratio: 0.29 AI PHT:            665 msec  AORTA Ao Root diam: 3.70 cm Ao Asc diam:  4.00 cm  MITRAL VALVE               TRICUSPID  VALVE MV Area (PHT): 2.60 cm    TR Peak grad:   15.5 mmHg MV Decel Time: 292 msec    TR Vmax:        197.00 cm/s MV E velocity: 85.90 cm/s MV A velocity: 85.90 cm/s  SHUNTS MV E/A ratio:  1.00        Systemic VTI:  0.26 m Systemic Diam: 2.20 cm  Weston Brass MD Electronically signed by Weston Brass MD Signature Date/Time: 04/10/2023/10:42:17 PM    Final     CT SCANS  CT CORONARY MORPH W/CTA COR W/SCORE 06/20/2011  Narrative **ADDENDUM** CREATED: 06/21/2011 08:45:41  OVER-READ INTERPRETATION - CT CHEST  The following report is an over-read performed by radiologist Dr. Charline Bills, M.D. of [facility] on 06/21/2011 08:45:41.  This over-read does not include interpretation of cardiac or coronary anatomy or pathology.  The CTA interpretation by the cardiologist is attached.  Comparison:  Chest radiograph dated 09/02/2009.  Findings: Mild emphysematous changes.  Mild dependent atelectasis in the bilateral lower lobes.  No suspicious pulmonary nodules.  No pleural effusion or pneumothorax.  No suspicious mediastinal or hilar lymphadenopathy.  Mild degenerate changes of the visualized thoracic spine.  IMPRESSION: Mild emphysematous changes.  **END ADDENDUM** SIGNED BY: Charline Bills, M.D. Original Report Authenticated By: Charline Bills, M.D.          03/20/2023: B Natriuretic Peptide 118.8; BUN 11; Creatinine, Ser 0.76; Hemoglobin 14.3; Platelets 214; Potassium 3.9; Sodium 137   STS RISK CALCULATOR:  Procedure Type: Isolated AVR PERIOPERATIVE OUTCOME ESTIMATE % Operative Mortality 3.3% Morbidity & Mortality 10.8% Stroke 1.53% Renal Failure 1.67% Reoperation 4.38% Prolonged Ventilation 3.95% Deep Sternal Wound Infection 0.035% Long Hospital Stay (>14 days) 4.29% Short Hospital Stay (<6 days)* 41.1%   NHYA CLASS: 2  CCS CLASS: 1  EUROSCORE II: 1.53%  ASSESSMENT AND PLAN:   Nonrheumatic aortic valve stenosis - Plan: EKG  12-Lead  Coronary artery disease involving native coronary artery of native heart without angina pectoris  The patient has developed NYHA class II symptoms of dyspnea without exertional angina.  He has a heavily calcified valve with restricted motion with indices that indicate moderate aortic stenosis.  Given his lifestyle limiting dyspnea, will refer patient for inclusion in the PROGRESS CAP program.  I will refer the patient for coronary angiography and right heart catheterization with Dr. Shirlee Latch, TAVR protocol CTA, and cardiothoracic surgical opinion.  Further recommendations will be provided once all imaging studies have been reviewed.  I have personally reviewed the patients imaging data as summarized above.  I have reviewed the natural history of aortic stenosis with the patient and family members who are present today. We have discussed the limitations of medical therapy and the poor prognosis associated with symptomatic aortic stenosis. We have also reviewed potential treatment options, including palliative medical therapy, conventional surgical aortic valve replacement, and transcatheter aortic valve replacement. We discussed treatment options in the context of this patient's specific comorbid medical conditions.   All of the patient's questions were answered today. Will make further recommendations based on the results of studies outlined above.   Total time spent with patient today 60 minutes. This includes reviewing records, evaluating  the patient and coordinating care.   Orbie Pyo, MD  04/17/2023 2:39 PM    [facility] [address] [place], [place], [place]  [address] Phone: [phone]; Fax: [phone]

## 2023-04-19 NOTE — Progress Notes (Signed)
Progress CAP labs for review

## 2023-04-23 DIAGNOSIS — Z961 Presence of intraocular lens: Secondary | ICD-10-CM | POA: Diagnosis not present

## 2023-04-23 DIAGNOSIS — H52203 Unspecified astigmatism, bilateral: Secondary | ICD-10-CM | POA: Diagnosis not present

## 2023-04-24 ENCOUNTER — Encounter (HOSPITAL_COMMUNITY): Payer: Self-pay | Admitting: Cardiology

## 2023-04-28 NOTE — Progress Notes (Unsigned)
NEUROLOGY FOLLOW UP OFFICE NOTE  QUEEN WEYMAN [id number]  Assessment/Plan:   Idiopathic polyneuropahty Lumbar spondylosis with history of multiple lumbar spine surgeries   Will prescribe gabapentin 100mg  to take once daily as needed during the day when he is out to control pain when he is on his feet and wearing footwear.  If ineffective, he can try 200mg  and then 300mg  if needed.  He will let me know which dose is effective and I will refill that dose. 2.  Gabapentin to 600mg  at 4PM and 600mg  at bedtime.  3.  Follow up 1 year.   Subjective:  Danny [name] is an 87 year old right-handed White male with chronic hepatitis, cervical disc disease, lumbar spine disease s/p multiple surgeries,CAD and history of ischemic left sixth nerve palsy who follows up for polyneuropathy.   UPDATE: Current medication:  gabapentin 600mg  at 4PM and 600mg  at bedtime.  Also takes tramadol 50mg  PRN for back pain.              Pain is noticeable during the day when there is rubbing on his feet, such as while wearing shoes or socks.  It interferes when he is out of the house, such as playing golf.  When he can rest without shoes on, it is better.  Massages them every night which helps.  It is worse after on his feet or exercising for a while.  Uses a foot cream with arnica, aloe and I-arginine and also uses a magnesium balm which help.     HISTORY: In 2008, he began experiencing left upper extremity numbness and tingling following rotator cuff surgery.  It involves only the hand and is fairly constant.  No pain or weakness.  He is still able to perform daily activities such as chores around the house or playing golf.  EMG in 2014 reportedly negative.  Saw Dr. Frances Furbish at [facility] in 2014.  NCV-EMG of left upper extremity was reportedly normal.  MRI of cervical spine showed mild degenerative disc disease but no significant central or foraminal stenosis.  MRI of brain showed mild  age-related changes but overall unremarkable.  No specific diagnosis was determined.  It has been unchanged over the years.  Repeat NCV-EMG of left upper extremity on 11/25/2019 was normal.     For many years, he has had numbness and tingling in the feet as well as intermittent muscle cramps in the calves.  He has changed from Lipitor to Crestor with no improvement.  He typically treats with mustard and apple cider vinegar.  At night, he notes burning and stinging pain in the feet.  Bood work in 2014 showed negative ANA, negative RF, CRP 1.3, Hgb A1c 6.3, CK 116, elevated TSH 5.370, but subsequent TSH has been normal.  He has history of multiple back surgeries.  He had sinal fusion L3-4 in 2010, followed by spinal fusion L2-3 in 2011.  To evaluate low back pain with right leg numbness, he underwent MRI of lumbar spine on 03/26/2015 which showed interbody fusion at L2-3 and L3-4 without significant stenosis and disc degeneration and spurring causing right foraminal encroachment at L4-5 and L5-S1.   He underwent extra-foraminal microdiskectomy at L4-5 and L5-S1 in January 2017 but then developed left lower back pain into the hip.  Myelogram on 07/12/2016 showed solid fusion at L2-3 and L3-4 as well as multilevel subarticular and foraminal stenosis.  NCV-EMG of the lower extremities from 11/30/2019 showed moderate to severe mixed axonal and  demyelinating sensorimotor polyneuropathy but no evidence of lumbar radiculopathy, plexopathy or myopathy.  Labs from May 2021 include B12 459, folate >23.7, Mg 2.2, TSH 2.17, free T4 0.83, and SPEP/IFE negative for monoclonal protein.    In June 2021, he developed double vision.  Double vision was only noticeable unless he looks to the left greater than right.  He reports history of 6th nerve palsy 20 years ago and thought it was a recurrence.  He saw ophthalmology who diagnosed left 6th nerve palsy .  He also had a left sided headache above the left eye, which was previously found  to be sinusitis. He saw ENT for possible sinus infection, which was ruled out.  MRI of brain with and without contrast on 05/05/2020 showed chronic small vessel ischemic changes in the cerebral white matter but no brainstem or cavernous sinus abnormalities.  Labs included sed rate 9, CRP 0.8.and Ace 11.  Diplopia subsequently resolved.  Dull left frontal headache about 3 times a week.  It happens when he gets up in the morning and is resolved by afternoon.  The headaches are chronic, for many years.   Past medication: Lyrica   Imaging: 05/20/2013 MRI CERVICAL SPINE WO:  Mildly abnormal MRI scan of the cervical spine showing exaggerated forward lordotic curvature and minor disc degenerative changes without significant compression. 06/18/2013 MRI BRAIN W WO:  Abnormal MRI scan of the brain showing mild changes of chronic microvascular ischemia and generalized cerebral atrophy. 03/26/2015 MRI LUMBAR SPINE WO:  interbody fusion at L2-3 and L3-4 without significant stenosis.  Lumbar dextroscoliosis.  Disc degeneration and spurring causing right foraminal encroachment.  L4-5 and L5-S1, similar to 2011. 07/12/2016 CT MYELOGRAM LUMBAR SPINE:  1. Solid fusion at L2-3 and L3-4.  2. Progressive adjacent level disease at L1-2 with progressive moderate left and mild right subarticular stenosis.  3. Moderate left foraminal narrowing at L1-2.  4. Moderate left subarticular and mild left foraminal stenosis at L2-3 without significant interval change.  5. Moderate left subarticular and mild left foraminal narrowing at L3-4 is stable. This is predominantly due to facet disease.  6. Mild subarticular and foraminal narrowing bilaterally at L4-5.  7. Mild subarticular narrowing at L5-S1 is worse on the right.  8. Moderate right and mild left foraminal narrowing at L5-S1 is stable. 05/05/2020 MRI BRAIN W WO:  No explanation for 6 nerve palsy. Brainstem and cavernous sinus normal. No orbital mass.  No acute abnormality.  Progression of chronic microvascular ischemic change in the white matter compared with MRI of 06/18/2013  PAST MEDICAL HISTORY: Past Medical History:  Diagnosis Date   Allergic rhinitis    Arthritis    BPH (benign prostatic hypertrophy)    Dr. Laverle Patter   Cervical disc disease    Coronary artery disease    Patient developed exertional dyspnea. ETT-myoview (8/12) with 11 METS, normal LV EF, normal perfusion at rest and stress but elevated TID ratio. Coronary CT angiogram in 9/12 showed equivocal significant disease in the CFX. Cath in 10/12 showed EF 55-60%, 30% mid RCA, 40% distal RCA, 40% proximal LAD. ETT-Myoview (10/14): Diaphragmatic attenuation, no ischemia, EF 62%, normal study   Detached vitreous humor    Diverticulosis    GERD (gastroesophageal reflux disease)    Hemorrhoids    Impaired fasting glucose    Ingrown toenail    Lumbar radiculopathy    Numbness and tingling in left arm    Onychomycosis    Rotator cuff disorder    Sixth nerve palsy  09/18/1999    MEDICATIONS: Current Outpatient Medications on File Prior to Visit  Medication Sig Dispense Refill   acetaminophen (TYLENOL) 500 MG tablet Take 500-1,000 mg by mouth 2 (two) times daily as needed (pain.).     aspirin EC 81 MG tablet Take 81 mg by mouth in the morning.     betamethasone dipropionate 0.05 % cream Apply 0.5 application  topically 2 (two) times daily as needed (dry/irritated skin).     Calcium-Magnesium (CAL-MAG PO) Take 1 tablet by mouth in the morning. With Potassium     CHOLECALCIFEROL PO Take 2-3 capsules by mouth in the morning.     Coenzyme Q10 (CO Q 10 PO) Take 400 mg by mouth in the morning.     esomeprazole (NEXIUM) 20 MG capsule Take 20 mg by mouth See admin instructions. Take 1 capsule (20 mg) by mouth scheduled after supper, may take an additional dose during the day, if needed for heartburn/indigestion.     finasteride (PROSCAR) 5 MG tablet Take 5 mg by mouth in the morning.     furosemide (LASIX)  20 MG tablet Take 1 tablet (20 mg total) by mouth as needed (For weight gain of 3 lbs in 24 hours or 5 lbs in a week). For weight gain of 3 lbs in 24 hours or 5 lbs in a week 30 tablet 0   gabapentin (NEURONTIN) 600 MG tablet TAKE 1 TABLET AT 4PM AND TAKE 1 TABLET AT BEDTIME. 180 tablet 3   Magnesium Oxide 400 MG CAPS Take 1 capsule (400 mg total) by mouth daily. 30 capsule 11   metoprolol succinate (TOPROL XL) 25 MG 24 hr tablet Take 1 tablet (25 mg total) by mouth daily. (Patient taking differently: Take 25 mg by mouth daily as needed (chest tightness/afib).) 30 tablet 3   Multiple Vitamin (MULTIVITAMIN WITH MINERALS) TABS tablet Take 1 tablet by mouth daily. Centrum Silver     Multiple Vitamins-Minerals (PRESERVISION AREDS 2) CAPS Take 1 capsule by mouth in the morning and at bedtime.     nitroGLYCERIN (NITROSTAT) 0.4 MG SL tablet ONE TABLET UNDER TONGUE WHEN NEEDED FOR CHEST PAIN. MAY REPEAT IN 5 MINUTES. 25 tablet 6   Polyvinyl Alcohol-Povidone PF (REFRESH) 1.4-0.6 % SOLN Place 1-2 drops into both eyes 3 (three) times daily as needed (dry/irritated eyes.).     Probiotic Product (PROBIOTIC-10 PO) Take 1 capsule by mouth in the morning.     RESTASIS 0.05 % ophthalmic emulsion Place 1 drop into both eyes 2 (two) times daily as needed (dry/irritated eyes.).     rosuvastatin (CRESTOR) 5 MG tablet TAKE 1 TABLET ONCE DAILY. 90 tablet 0   Saw Palmetto 160 MG CAPS Take 160 mg by mouth 2 (two) times daily.     tamsulosin (FLOMAX) 0.4 MG CAPS capsule Take 0.4 mg by mouth in the morning and at bedtime.     traMADol (ULTRAM) 50 MG tablet Take 50 mg by mouth 3 (three) times daily as needed (back pain.).     traZODone (DESYREL) 50 MG tablet Take 25 mg by mouth at bedtime as needed for sleep.     Turmeric (QC TUMERIC COMPLEX) 500 MG CAPS Take 500 mg by mouth in the morning.     zolpidem (AMBIEN) 10 MG tablet Take 10 mg by mouth at bedtime as needed for sleep.     No current facility-administered medications  on file prior to visit.    ALLERGIES: No Known Allergies  FAMILY HISTORY: Family History  Problem  Relation Age of Onset   Cancer Mother        multiple myeloma   Hypertension Mother    Stroke Father    Heart attack Neg Hx       Objective:  Blood pressure 118/76, pulse 71, height 5\' 10"  (1.778 m), weight 164 lb (74.4 kg), SpO2 98%. General: No acute distress.  Patient appears well-groomed.   Head:  Normocephalic/atraumatic Eyes:  Fundi examined but not visualized Neck: supple, no paraspinal tenderness, full range of motion Heart:  Regular rate and rhythm Neurological Exam: alert and oriented.  Speech fluent and not dysarthric, language intact.  CN II-XII intact. Bulk and tone normal, muscle strength 5/5 throughout.  Sensation to light touch intact.  Deep tendon reflexes 2+ throughout.  Finger to nose testing intact.  Gait steady, Romberg negative.   Danny Millet, DO  CC: Eleanora Neighbor, MD

## 2023-04-30 ENCOUNTER — Encounter: Payer: Self-pay | Admitting: Neurology

## 2023-04-30 ENCOUNTER — Ambulatory Visit (INDEPENDENT_AMBULATORY_CARE_PROVIDER_SITE_OTHER): Payer: Medicare Other | Admitting: Neurology

## 2023-04-30 ENCOUNTER — Other Ambulatory Visit: Payer: Self-pay

## 2023-04-30 VITALS — BP 118/76 | HR 71 | Ht 70.0 in | Wt 164.0 lb

## 2023-04-30 DIAGNOSIS — G6289 Other specified polyneuropathies: Secondary | ICD-10-CM

## 2023-04-30 DIAGNOSIS — Z9889 Other specified postprocedural states: Secondary | ICD-10-CM

## 2023-04-30 DIAGNOSIS — Z006 Encounter for examination for normal comparison and control in clinical research program: Secondary | ICD-10-CM

## 2023-04-30 DIAGNOSIS — I350 Nonrheumatic aortic (valve) stenosis: Secondary | ICD-10-CM

## 2023-04-30 MED ORDER — GABAPENTIN 600 MG PO TABS: ORAL_TABLET | ORAL | 3 refills | Status: DC

## 2023-04-30 MED ORDER — GABAPENTIN 100 MG PO CAPS: 100.0000 mg | ORAL_CAPSULE | Freq: Every day | ORAL | 0 refills | Status: DC | PRN

## 2023-04-30 NOTE — Patient Instructions (Signed)
Continue gabapentin 600mg  at 4 PM and 600mg  at bedtime Take gabapentin 100mg  once during the day if needed.  If ineffective, then next time, take 2 pills when needed.  If that is ineffective, then try taking 3 pills when needed.  Let me know which dose is most effective and I will refill at that appropriate dose. Follow up one year

## 2023-05-01 ENCOUNTER — Telehealth (HOSPITAL_COMMUNITY): Payer: Self-pay

## 2023-05-01 NOTE — Telephone Encounter (Signed)
Called and spoke with patients wife (okay per DPR) advised her that we will need to reschedule R/LHC due to provider being out of office. Rescheduled patient to Thursday 8/15 per provider request.   Patient and patients wife aware of all instructions and verbalized understanding.

## 2023-05-02 ENCOUNTER — Ambulatory Visit (HOSPITAL_COMMUNITY)
Admission: RE | Disposition: A | Discharge status: Home / Self Care | Payer: Self-pay | Source: Home / Self Care | Attending: Internal Medicine

## 2023-05-02 ENCOUNTER — Observation Stay (HOSPITAL_COMMUNITY)
Admission: RE | Admit: 2023-05-02 | Discharge: 2023-05-03 | Disposition: A | Discharge status: Home / Self Care | Payer: Medicare Other | Attending: Internal Medicine | Admitting: Internal Medicine

## 2023-05-02 ENCOUNTER — Other Ambulatory Visit: Payer: Self-pay

## 2023-05-02 DIAGNOSIS — R0609 Other forms of dyspnea: Secondary | ICD-10-CM | POA: Diagnosis present

## 2023-05-02 DIAGNOSIS — Z7982 Long term (current) use of aspirin: Secondary | ICD-10-CM | POA: Insufficient documentation

## 2023-05-02 DIAGNOSIS — R079 Chest pain, unspecified: Secondary | ICD-10-CM | POA: Diagnosis present

## 2023-05-02 DIAGNOSIS — I350 Nonrheumatic aortic (valve) stenosis: Secondary | ICD-10-CM | POA: Diagnosis not present

## 2023-05-02 DIAGNOSIS — Z955 Presence of coronary angioplasty implant and graft: Principal | ICD-10-CM

## 2023-05-02 DIAGNOSIS — I2510 Atherosclerotic heart disease of native coronary artery without angina pectoris: Secondary | ICD-10-CM | POA: Diagnosis not present

## 2023-05-02 DIAGNOSIS — I25119 Atherosclerotic heart disease of native coronary artery with unspecified angina pectoris: Secondary | ICD-10-CM

## 2023-05-02 DIAGNOSIS — Z79899 Other long term (current) drug therapy: Secondary | ICD-10-CM | POA: Diagnosis not present

## 2023-05-02 HISTORY — PX: RIGHT HEART CATH AND CORONARY ANGIOGRAPHY: CATH118264

## 2023-05-02 HISTORY — PX: RIGHT/LEFT HEART CATH AND CORONARY ANGIOGRAPHY: CATH118266

## 2023-05-02 HISTORY — PX: CORONARY STENT INTERVENTION: CATH118234

## 2023-05-02 LAB — POCT I-STAT EG7
Acid-base deficit: 1 mmol/L (ref 0.0–2.0)
Acid-base deficit: 1 mmol/L (ref 0.0–2.0)
Bicarbonate: 24.3 mmol/L (ref 20.0–28.0)
Bicarbonate: 24.9 mmol/L (ref 20.0–28.0)
Calcium, Ion: 1.21 mmol/L (ref 1.15–1.40)
Calcium, Ion: 1.28 mmol/L (ref 1.15–1.40)
HCT: 35 % — ABNORMAL LOW (ref 39.0–52.0)
HCT: 37 % — ABNORMAL LOW (ref 39.0–52.0)
Hemoglobin: 11.9 g/dL — ABNORMAL LOW (ref 13.0–17.0)
Hemoglobin: 12.6 g/dL — ABNORMAL LOW (ref 13.0–17.0)
O2 Saturation: 69 %
O2 Saturation: 72 %
Potassium: 3.5 mmol/L (ref 3.5–5.1)
Potassium: 3.7 mmol/L (ref 3.5–5.1)
Sodium: 140 mmol/L (ref 135–145)
Sodium: 141 mmol/L (ref 135–145)
TCO2: 26 mmol/L (ref 22–32)
TCO2: 26 mmol/L (ref 22–32)
pCO2, Ven: 42.1 mmHg — ABNORMAL LOW (ref 44–60)
pCO2, Ven: 43.9 mmHg — ABNORMAL LOW (ref 44–60)
pH, Ven: 7.362 (ref 7.25–7.43)
pH, Ven: 7.369 (ref 7.25–7.43)
pO2, Ven: 37 mmHg (ref 32–45)
pO2, Ven: 39 mmHg (ref 32–45)

## 2023-05-02 LAB — POCT ACTIVATED CLOTTING TIME
Activated Clotting Time: 244 seconds
Activated Clotting Time: 281 seconds

## 2023-05-02 SURGERY — RIGHT/LEFT HEART CATH AND CORONARY ANGIOGRAPHY
Anesthesia: LOCAL

## 2023-05-02 MED ORDER — TRAMADOL HCL 50 MG PO TABS
50.0000 mg | ORAL_TABLET | Freq: Four times a day (QID) | ORAL | Status: DC
  Administered 2023-05-02 – 2023-05-03 (×3): 50 mg via ORAL
  Filled 2023-05-02 (×3): qty 1

## 2023-05-02 MED ORDER — IOHEXOL 350 MG/ML SOLN
INTRAVENOUS | Status: DC | PRN
  Administered 2023-05-02: 55 mL
  Administered 2023-05-02: 95 mL

## 2023-05-02 MED ORDER — HEPARIN SODIUM (PORCINE) 1000 UNIT/ML IJ SOLN
INTRAMUSCULAR | Status: DC | PRN
  Administered 2023-05-02: 3000 [IU] via INTRAVENOUS

## 2023-05-02 MED ORDER — CLOPIDOGREL BISULFATE 75 MG PO TABS
75.0000 mg | ORAL_TABLET | Freq: Every day | ORAL | Status: DC
  Administered 2023-05-03: 75 mg via ORAL
  Filled 2023-05-02: qty 1

## 2023-05-02 MED ORDER — HYDRALAZINE HCL 20 MG/ML IJ SOLN: 10.0000 mg | INTRAMUSCULAR | Status: AC | PRN

## 2023-05-02 MED ORDER — ACETAMINOPHEN 325 MG PO TABS: 650.0000 mg | ORAL_TABLET | ORAL | Status: DC | PRN

## 2023-05-02 MED ORDER — CLOPIDOGREL BISULFATE 300 MG PO TABS
ORAL_TABLET | ORAL | Status: DC | PRN
  Administered 2023-05-02: 600 mg via ORAL

## 2023-05-02 MED ORDER — ONDANSETRON HCL 4 MG/2ML IJ SOLN: 4.0000 mg | Freq: Four times a day (QID) | INTRAMUSCULAR | Status: DC | PRN

## 2023-05-02 MED ORDER — HEPARIN SODIUM (PORCINE) 1000 UNIT/ML IJ SOLN
INTRAMUSCULAR | Status: AC
  Filled 2023-05-02: qty 10

## 2023-05-02 MED ORDER — HEPARIN SODIUM (PORCINE) 1000 UNIT/ML IJ SOLN
INTRAMUSCULAR | Status: DC | PRN
  Administered 2023-05-02: 3500 [IU] via INTRAVENOUS

## 2023-05-02 MED ORDER — ASPIRIN 81 MG PO CHEW
81.0000 mg | CHEWABLE_TABLET | Freq: Every day | ORAL | Status: DC
  Administered 2023-05-03: 81 mg via ORAL
  Filled 2023-05-02: qty 1

## 2023-05-02 MED ORDER — FENTANYL CITRATE (PF) 100 MCG/2ML IJ SOLN
INTRAMUSCULAR | Status: AC
  Filled 2023-05-02: qty 2

## 2023-05-02 MED ORDER — HEPARIN SODIUM (PORCINE) 1000 UNIT/ML IJ SOLN
INTRAMUSCULAR | Status: DC | PRN
  Administered 2023-05-02: 5000 [IU] via INTRAVENOUS

## 2023-05-02 MED ORDER — LABETALOL HCL 5 MG/ML IV SOLN: 10.0000 mg | INTRAVENOUS | Status: AC | PRN

## 2023-05-02 MED ORDER — SODIUM CHLORIDE 0.9% FLUSH
3.0000 mL | Freq: Two times a day (BID) | INTRAVENOUS | Status: DC
  Administered 2023-05-03: 3 mL via INTRAVENOUS

## 2023-05-02 MED ORDER — VERAPAMIL HCL 2.5 MG/ML IV SOLN
INTRAVENOUS | Status: AC
  Filled 2023-05-02: qty 2

## 2023-05-02 MED ORDER — SODIUM CHLORIDE 0.9 % IV SOLN: 250.0000 mL | INTRAVENOUS | Status: DC | PRN

## 2023-05-02 MED ORDER — ZOLPIDEM TARTRATE 5 MG PO TABS
5.0000 mg | ORAL_TABLET | Freq: Every day | ORAL | Status: DC
  Administered 2023-05-02: 5 mg via ORAL
  Filled 2023-05-02: qty 1

## 2023-05-02 MED ORDER — MIDAZOLAM HCL 2 MG/2ML IJ SOLN
INTRAMUSCULAR | Status: DC | PRN
  Administered 2023-05-02: 1 mg via INTRAVENOUS

## 2023-05-02 MED ORDER — SODIUM CHLORIDE 0.9% FLUSH: 3.0000 mL | INTRAVENOUS | Status: DC | PRN

## 2023-05-02 MED ORDER — HEPARIN (PORCINE) IN NACL 1000-0.9 UT/500ML-% IV SOLN
INTRAVENOUS | Status: DC | PRN
  Administered 2023-05-02: 1000 mL

## 2023-05-02 MED ORDER — FAMOTIDINE IN NACL 20-0.9 MG/50ML-% IV SOLN
INTRAVENOUS | Status: AC
  Filled 2023-05-02: qty 50

## 2023-05-02 MED ORDER — NITROGLYCERIN 1 MG/10 ML FOR IR/CATH LAB
INTRA_ARTERIAL | Status: AC
  Filled 2023-05-02: qty 10

## 2023-05-02 MED ORDER — MIDAZOLAM HCL 2 MG/2ML IJ SOLN
INTRAMUSCULAR | Status: AC
  Filled 2023-05-02: qty 2

## 2023-05-02 MED ORDER — SODIUM CHLORIDE 0.9 % IV SOLN: INTRAVENOUS | Status: AC

## 2023-05-02 MED ORDER — LIDOCAINE HCL (PF) 1 % IJ SOLN
INTRAMUSCULAR | Status: AC
  Filled 2023-05-02: qty 30

## 2023-05-02 MED ORDER — FENTANYL CITRATE (PF) 100 MCG/2ML IJ SOLN
INTRAMUSCULAR | Status: DC | PRN
  Administered 2023-05-02: 25 ug via INTRAVENOUS

## 2023-05-02 MED ORDER — FAMOTIDINE IN NACL 20-0.9 MG/50ML-% IV SOLN
INTRAVENOUS | Status: AC | PRN
  Administered 2023-05-02: 20 mg via INTRAVENOUS

## 2023-05-02 MED ORDER — SODIUM CHLORIDE 0.9 % IV SOLN: INTRAVENOUS | Status: DC

## 2023-05-02 SURGICAL SUPPLY — 29 items
BALLN EMERGE MR 2.0X15 (BALLOONS) ×1
BALLN EMERGE MR 2.5X12 (BALLOONS) ×1
BALLN WOLVERINE 3.00X15 (BALLOONS) ×1
BALLN ~~LOC~~ EMERGE MR 3.25X12 (BALLOONS) ×1
BALLOON EMERGE MR 2.0X15 (BALLOONS) IMPLANT
BALLOON EMERGE MR 2.5X12 (BALLOONS) IMPLANT
BALLOON WOLVERINE 3.00X15 (BALLOONS) IMPLANT
BALLOON ~~LOC~~ EMERGE MR 3.25X12 (BALLOONS) IMPLANT
CATH 5FR JL3.5 JR4 ANG PIG MP (CATHETERS) IMPLANT
CATH BALLN WEDGE 5F 110CM (CATHETERS) IMPLANT
CATH DRAGONFLY OPSTAR (CATHETERS) IMPLANT
CATH GUIDELINER COAST (CATHETERS) IMPLANT
CATH LAUNCHER 5F JL3 (CATHETERS) IMPLANT
CATH LAUNCHER 6FR EBU3.5 (CATHETERS) IMPLANT
CATHETER LAUNCHER 5F JL3 (CATHETERS) ×1
DEVICE RAD COMP TR BAND LRG (VASCULAR PRODUCTS) IMPLANT
GLIDESHEATH SLEND SS 6F .021 (SHEATH) IMPLANT
GUIDEWIRE INQWIRE 1.5J.035X260 (WIRE) IMPLANT
GUIDEWIRE VAS SION BLUE 190 (WIRE) IMPLANT
INQWIRE 1.5J .035X260CM (WIRE) ×1
KIT ENCORE 26 ADVANTAGE (KITS) IMPLANT
PACK CARDIAC CATHETERIZATION (CUSTOM PROCEDURE TRAY) ×1 IMPLANT
PROTECTION STATION PRESSURIZED (MISCELLANEOUS) ×1
SET ATX-X65L (MISCELLANEOUS) IMPLANT
SHEATH GLIDE SLENDER 4/5FR (SHEATH) IMPLANT
STATION PROTECTION PRESSURIZED (MISCELLANEOUS) IMPLANT
STENT SYNERGY XD 3.0X16 (Permanent Stent) IMPLANT
SYNERGY XD 3.0X16 (Permanent Stent) ×1 IMPLANT
WIRE EMERALD 3MM-J .035X260CM (WIRE) IMPLANT

## 2023-05-02 NOTE — Plan of Care (Signed)

## 2023-05-02 NOTE — Interval H&P Note (Signed)
History and Physical Interval Note:  05/02/2023 1:19 PM  Danny [name]  has presented today for surgery, with the diagnosis of pre TAVR workup.  The various methods of treatment have been discussed with the patient and family. After consideration of risks, benefits and other options for treatment, the patient has consented to  Procedure(s): RIGHT/LEFT HEART CATH AND CORONARY ANGIOGRAPHY ([name]) as a surgical intervention.  The patient's history has been reviewed, patient examined, no change in status, stable for surgery.  I have reviewed the patient's chart and labs.  Questions were answered to the patient's satisfaction.     Aadi Bordner [employer]

## 2023-05-02 NOTE — Progress Notes (Signed)
Removed TR band from R radial. Site is a Level 1 for bruising. Applied gauze and Tegaderm to site. Educated patient and wife to leave in place for 24 hrs. Both verbalized understanding.

## 2023-05-03 ENCOUNTER — Encounter (HOSPITAL_COMMUNITY): Payer: Self-pay | Admitting: Cardiology

## 2023-05-03 DIAGNOSIS — Z79899 Other long term (current) drug therapy: Secondary | ICD-10-CM | POA: Diagnosis not present

## 2023-05-03 DIAGNOSIS — I25118 Atherosclerotic heart disease of native coronary artery with other forms of angina pectoris: Secondary | ICD-10-CM | POA: Diagnosis not present

## 2023-05-03 DIAGNOSIS — Z7982 Long term (current) use of aspirin: Secondary | ICD-10-CM | POA: Diagnosis not present

## 2023-05-03 DIAGNOSIS — E785 Hyperlipidemia, unspecified: Secondary | ICD-10-CM

## 2023-05-03 DIAGNOSIS — I2510 Atherosclerotic heart disease of native coronary artery without angina pectoris: Secondary | ICD-10-CM | POA: Diagnosis not present

## 2023-05-03 DIAGNOSIS — I350 Nonrheumatic aortic (valve) stenosis: Secondary | ICD-10-CM | POA: Diagnosis not present

## 2023-05-03 LAB — BASIC METABOLIC PANEL
Anion gap: 9 (ref 5–15)
BUN: 14 mg/dL (ref 8–23)
CO2: 21 mmol/L — ABNORMAL LOW (ref 22–32)
Calcium: 8.9 mg/dL (ref 8.9–10.3)
Chloride: 106 mmol/L (ref 98–111)
Creatinine, Ser: 0.83 mg/dL (ref 0.61–1.24)
GFR, Estimated: >60 mL/min (ref >60)
Glucose, Bld: 79 mg/dL (ref 70–99)
Potassium: 4 mmol/L (ref 3.5–5.1)
Sodium: 136 mmol/L (ref 135–145)

## 2023-05-03 LAB — CBC
HCT: 37 % — ABNORMAL LOW (ref 39.0–52.0)
Hemoglobin: 12.3 g/dL — ABNORMAL LOW (ref 13.0–17.0)
MCH: 30.3 pg (ref 26.0–34.0)
MCHC: 33.2 g/dL (ref 30.0–36.0)
MCV: 91.1 fL (ref 80.0–100.0)
Platelets: 174 10*3/uL (ref 150–400)
RBC: 4.06 MIL/uL — ABNORMAL LOW (ref 4.22–5.81)
RDW: 13.4 % (ref 11.5–15.5)
WBC: 8.2 10*3/uL (ref 4.0–10.5)
nRBC: 0 % (ref 0.0–0.2)

## 2023-05-03 MED ORDER — PANTOPRAZOLE SODIUM 40 MG PO TBEC: 40.0000 mg | DELAYED_RELEASE_TABLET | Freq: Every day | ORAL | 3 refills | Status: AC

## 2023-05-03 MED ORDER — CLOPIDOGREL BISULFATE 75 MG PO TABS: 75.0000 mg | ORAL_TABLET | Freq: Every day | ORAL | 3 refills | Status: DC

## 2023-05-03 MED FILL — Nitroglycerin IV Soln 100 MCG/ML in D5W: INTRA_ARTERIAL | Qty: 10 | Status: AC

## 2023-05-03 MED FILL — Lidocaine HCl Local Preservative Free (PF) Inj 1%: INTRAMUSCULAR | Qty: 30 | Status: AC

## 2023-05-03 MED FILL — Verapamil HCl IV Soln 2.5 MG/ML: INTRAVENOUS | Qty: 2 | Status: AC

## 2023-05-03 NOTE — Discharge Summary (Cosign Needed Addendum)
Discharge Summary    Patient ID: KYAW ZARN MRN: [id number]; DOB: 09-21-1935  Admit date: 05/02/2023 Discharge date: 05/03/2023  PCP:  [name], MD    [facility] Providers Cardiologist:  Marca Ancona, MD   Discharge Diagnoses    Principal Problem:   Aortic stenosis Active Problems:   Exertional dyspnea   CAD (coronary artery disease)   Chest pain at rest   Diagnostic Studies/Procedures    Coronary stent intervention 05/02/23:   Mid LAD lesion is 95% stenosed.   A stent was successfully placed.   Post intervention, there is a 0% residual stenosis.   1.  High-grade mid LAD lesion treated with OCT guided PCI with Cutting Balloon angioplasty and drug-eluting stent x 1.   Recommendation: Continue evaluation for aortic valve intervention.     R/L [facility] 05/02/23:   Mid LAD lesion is 95% stenosed.   Mid RCA lesion is 25% stenosed.   1. Normal filling pressures.  2. 95% focal proximal LAD stenosis after 2nd diagonal.    Discussed with Dr. Lynnette Caffey, given significant exertional dyspnea as possible anginal equivalent, will plan PCI to LAD today. _____________   History of Present Illness     Danny [name] is a 87 y.o. male with aortic stenosis, CAD now s/p DES-mLAD, HLD, peripheral neuropathy who presented for heart cath to evaluate ongoing DOE despite lasix therapy.   The patient is a 87 y.o. male with the indicated medical history here for recommendations regarding his aortic valvular disease.  He has been followed by Dr. Shirlee Latch for some time.  He did undergo an evaluation for cardiac amyloid 2021 which was equivocal for transthyretin amyloid and thought to be likely negative.  Serial echocardiograms have shown a preserved ejection fraction however the development of worsening aortic stenosis and associated severe calcification of the aortic valve.  His most recent echocardiogram demonstrated moderate aortic stenosis.  He was seen in cardiology  clinic earlier this month with reports of increasing dyspnea.  He was thought to be volume overloaded so as needed Lasix was prescribed.  However there were concerns about his aortic stenosis contributing to his symptomatology.   The patient tells me he has developed increasing shortness of breath specially when [place].  The Lasix did help his peripheral edema and shortness of breath somewhat it did not completely resolve his dyspnea.  This is been more apparent over the last several months.  He also fatigues quite easily and earlier in the day.  He has had some occasional lightheadedness when he goes from sitting to standing quickly.  He denies any exertional angina.  He fortunately has not required any emergency room visits or hospitalizations.  He is quite concerned about the quality of his life from now as he is relatively active and would like to feel better for a longer period of time.   The patient does not smoke.  He reports excellent dental health.  Hospital Course     Consultants:    Coronary artery disease - heart cath yesterday with 95% stenosis in the mid LAD tratedw with OCT guided cutting balloon angioplasty and DES x 1 - 3.0 x 16 mm - placed on ASA and plavix - resume toprol as currently dosed and crestor     Aortic stenosis - moderate - continue workup for TAVR - followed by structural heart team     Hyperlipidemia with LDL goal < 70 08/23/2022: Cholesterol 130; HDL 59; LDL Cholesterol 56;  Triglycerides 73; VLDL 15 Crestor 5 mg  Pt was seen and examined by Dr. Tresa Endo and deemed stable for discharge. Follow up has been arranged.  He is a retired Teacher, early years/pre.     Did the patient have an acute coronary syndrome (MI, NSTEMI, STEMI, etc) this admission?:  No                               Did the patient have a percutaneous coronary intervention (stent / angioplasty)?:  Yes.     Cath/PCI Registry Performance & Quality Measures: Aspirin prescribed? - Yes ADP  Receptor Inhibitor (Plavix/Clopidogrel, Brilinta/Ticagrelor or Effient/Prasugrel) prescribed (includes medically managed patients)? - Yes High Intensity Statin (Lipitor 40-80mg  or Crestor 20-40mg ) prescribed? - No - intolerant For EF <40%, was ACEI/ARB prescribed? - Not Applicable (EF >/= 40%) For EF <40%, Aldosterone Antagonist (Spironolactone or Eplerenone) prescribed? - Not Applicable (EF >/= 40%) Cardiac Rehab Phase II ordered? - Yes       The patient will be scheduled for a TOC follow up appointment in 7-14 days.  A message has been sent to the [facility] and Scheduling Pool at the office where the patient should be seen for follow up.  _____________  Discharge Vitals Blood pressure (!) 135/95, pulse 68, temperature 98.3 F (36.8 C), temperature source Oral, resp. rate 17, height 5\' 10"  (1.778 m), weight 74.8 kg, SpO2 96%.  Filed Weights   05/02/23 1245  Weight: 74.8 kg    Labs & Radiologic Studies    CBC Recent Labs    05/02/23 1353 05/03/23 0359  WBC  --  8.2  HGB 12.6* 12.3*  HCT 37.0* 37.0*  MCV  --  91.1  PLT  --  174   Basic Metabolic Panel Recent Labs    47/82/95 1353 05/03/23 0359  NA 140 136  K 3.7 4.0  CL  --  106  CO2  --  21*  GLUCOSE  --  79  BUN  --  14  CREATININE  --  0.83  CALCIUM  --  8.9   Liver Function Tests No results for input(s): "AST", "ALT", "ALKPHOS", "BILITOT", "PROT", "ALBUMIN" in the last 72 hours. No results for input(s): "LIPASE", "AMYLASE" in the last 72 hours. High Sensitivity Troponin:   No results for input(s): "TROPONINIHS" in the last 720 hours.  BNP Invalid input(s): "POCBNP" D-Dimer No results for input(s): "DDIMER" in the last 72 hours. Hemoglobin A1C No results for input(s): "HGBA1C" in the last 72 hours. Fasting Lipid Panel No results for input(s): "CHOL", "HDL", "LDLCALC", "TRIG", "CHOLHDL", "LDLDIRECT" in the last 72 hours. Thyroid Function Tests No results for input(s): "TSH", "T4TOTAL", "T3FREE", "THYROIDAB"  in the last 72 hours.  Invalid input(s): "FREET3" _____________  CARDIAC CATHETERIZATION  Result Date: 05/02/2023   Mid LAD lesion is 95% stenosed.   A stent was successfully placed.   Post intervention, there is a 0% residual stenosis. 1.  High-grade mid LAD lesion treated with OCT guided PCI with Cutting Balloon angioplasty and drug-eluting stent x 1. Recommendation: Continue evaluation for aortic valve intervention.   CARDIAC CATHETERIZATION  Result Date: 05/02/2023   Mid LAD lesion is 95% stenosed.   Mid RCA lesion is 25% stenosed. 1. Normal filling pressures. 2. 95% focal proximal LAD stenosis after 2nd diagonal. Discussed with Dr. Lynnette Caffey, given significant exertional dyspnea as possible anginal equivalent, will plan PCI to LAD today.   ECHOCARDIOGRAM COMPLETE  Result Date: 04/10/2023  ECHOCARDIOGRAM REPORT   Patient Name:   Danny [name] Date of Exam: 04/09/2023 Medical Rec #:  [id number]         Height:       70.0 in Accession #:    [id number]        Weight:       163.0 lb Date of Birth:  03/28/1936         BSA:          1.914 m Patient Age:    87 years          BP:           151/83 mmHg Patient Gender: M                 HR:           58 bpm. Exam Location:  Inpatient Procedure: 2D Echo, 3D Echo, Cardiac Doppler, Color Doppler and Strain Analysis Indications:    I50.40* Unspecified combined systolic (congestive) and diastolic                 (congestive) heart failure; I35.0 Nonrheumatic aortic (valve)                 stenosis  History:        Patient has prior history of Echocardiogram examinations, most                 recent 09/05/2022. CAD, Aortic Valve Disease;                 Signs/Symptoms:Shortness of Breath, Dyspnea, Chest Pain and                 Murmur. Aortic stenosis.  Sonographer:    Sheralyn Boatman RDCS Referring Phys: [id number] DALTON S MCLEAN IMPRESSIONS  1. Left ventricular ejection fraction, by estimation, is 60 to 65%. Left ventricular ejection fraction by 3D volume is 60 %. The  left ventricle has normal function. The left ventricle has no regional wall motion abnormalities. Left ventricular diastolic  parameters were normal. The average left ventricular global longitudinal strain is -20.6 %. The global longitudinal strain is normal.  2. Right ventricular systolic function is normal. The right ventricular size is normal. There is normal pulmonary artery systolic pressure. The estimated right ventricular systolic pressure is 18.5 mmHg.  3. The mitral valve is grossly normal. Trivial mitral valve regurgitation. No evidence of mitral stenosis.  4. The aortic valve is tricuspid. There is severe calcifcation of the aortic valve. Aortic valve regurgitation is mild. Moderate aortic valve stenosis. Aortic valve area, by VTI measures 1.10 cm. Aortic valve mean gradient measures 33.0 mmHg. Aortic valve Vmax measures 3.68 m/s.  5. Aortic dilatation noted. There is borderline dilatation of the ascending aorta, measuring 40 mm.  6. The inferior vena cava is normal in size with greater than 50% respiratory variability, suggesting right atrial pressure of 3 mmHg. FINDINGS  Left Ventricle: Left ventricular ejection fraction, by estimation, is 60 to 65%. Left ventricular ejection fraction by 3D volume is 60 %. The left ventricle has normal function. The left ventricle has no regional wall motion abnormalities. The average left ventricular global longitudinal strain is -20.6 %. The global longitudinal strain is normal. The global longitudinal strain is normal despite suboptimal segment tracking. The left ventricular internal cavity size was normal in size. There is borderline left ventricular hypertrophy. Left ventricular diastolic parameters were normal. Right Ventricle: The right ventricular size is normal. No increase in right ventricular  wall thickness. Right ventricular systolic function is normal. There is normal pulmonary artery systolic pressure. The tricuspid regurgitant velocity is 1.97 m/s, and   with an assumed right atrial pressure of 3 mmHg, the estimated right ventricular systolic pressure is 18.5 mmHg. Left Atrium: Left atrial size was normal in size. Right Atrium: Right atrial size was normal in size. Pericardium: There is no evidence of pericardial effusion. Mitral Valve: The mitral valve is grossly normal. Trivial mitral valve regurgitation. No evidence of mitral valve stenosis. Tricuspid Valve: The tricuspid valve is normal in structure. Tricuspid valve regurgitation is trivial. Aortic Valve: The aortic valve is tricuspid. There is severe calcifcation of the aortic valve. Aortic valve regurgitation is mild. Aortic regurgitation PHT measures 665 msec. Moderate aortic stenosis is present. Aortic valve mean gradient measures 33.0 mmHg. Aortic valve peak gradient measures 54.1 mmHg. Aortic valve area, by VTI measures 1.10 cm. Pulmonic Valve: The pulmonic valve was normal in structure. Pulmonic valve regurgitation is trivial. Aorta: Aortic dilatation noted. There is borderline dilatation of the ascending aorta, measuring 40 mm. Venous: The inferior vena cava is normal in size with greater than 50% respiratory variability, suggesting right atrial pressure of 3 mmHg. IAS/Shunts: The interatrial septum was not well visualized.  LEFT VENTRICLE PLAX 2D LVIDd:         4.70 cm         Diastology LVIDs:         3.40 cm         LV e' medial:    7.29 cm/s LV PW:         1.00 cm         LV E/e' medial:  11.8 LV IVS:        1.00 cm         LV e' lateral:   8.49 cm/s LVOT diam:     2.20 cm         LV E/e' lateral: 10.1 LV SV:         98 LV SV Index:   51              2D LVOT Area:     3.80 cm        Longitudinal                                Strain                                2D Strain GLS  -20.6 % LV Volumes (MOD)               Avg: LV vol d, MOD    80.1 ml A2C:                           3D Volume EF LV vol d, MOD    86.1 ml       LV 3D EF:    Left A4C:                                        ventricul LV vol  s, MOD    40.0 ml  ar A2C:                                        ejection LV vol s, MOD    34.7 ml                    fraction A4C:                                        by 3D LV SV MOD A2C:   40.1 ml                    volume is LV SV MOD A4C:   86.1 ml                    60 %. LV SV MOD BP:    48.2 ml                                 3D Volume EF:                                3D EF:        60 %                                LV EDV:       118 ml                                LV ESV:       47 ml                                LV SV:        71 ml RIGHT VENTRICLE             IVC RV S prime:     78.75 cm/s  IVC diam: 2.00 cm TAPSE (M-mode): 1.8 cm LEFT ATRIUM             Index        RIGHT ATRIUM           Index LA diam:        3.80 cm 1.99 cm/m   RA Area:     11.70 cm LA Vol (A2C):   54.6 ml 28.53 ml/m  RA Volume:   19.80 ml  10.35 ml/m LA Vol (A4C):   34.1 ml 17.82 ml/m LA Biplane Vol: 45.5 ml 23.78 ml/m  AORTIC VALVE                     PULMONIC VALVE AV Area (Vmax):    1.15 cm      PR End Diast Vel: 1.48 msec AV Area (Vmean):   1.04 cm AV Area (VTI):     1.10 cm AV Vmax:           367.80 cm/s AV Vmean:          257.800 cm/s AV VTI:  0.895 m AV Peak Grad:      54.1 mmHg AV Mean Grad:      33.0 mmHg LVOT Vmax:         111.00 cm/s LVOT Vmean:        70.600 cm/s LVOT VTI:          0.258 m LVOT/AV VTI ratio: 0.29 AI PHT:            665 msec  AORTA Ao Root diam: 3.70 cm Ao Asc diam:  4.00 cm MITRAL VALVE               TRICUSPID VALVE MV Area (PHT): 2.60 cm    TR Peak grad:   15.5 mmHg MV Decel Time: 292 msec    TR Vmax:        197.00 cm/s MV E velocity: 85.90 cm/s MV A velocity: 85.90 cm/s  SHUNTS MV E/A ratio:  1.00        Systemic VTI:  0.26 m                            Systemic Diam: 2.20 cm Weston Brass MD Electronically signed by Weston Brass MD Signature Date/Time: 04/10/2023/10:42:17 PM    Final    Disposition   Pt is being discharged home today in good  condition.  Follow-up Plans & Appointments     Discharge Instructions     Amb Referral to Cardiac Rehabilitation   Complete by: As directed    Diagnosis: Coronary Stents   After initial evaluation and assessments completed: Virtual Based Care may be provided alone or in conjunction with Phase 2 Cardiac Rehab based on patient barriers.: Yes   Intensive Cardiac Rehabilitation (ICR) [facility] location only OR Traditional Cardiac Rehabilitation (TCR) *If criteria for ICR are not met will enroll in TCR [facility] only): Yes   Diet - low sodium heart healthy   Complete by: As directed    Discharge instructions   Complete by: As directed    No driving for 2 days. No lifting over 5 lbs for 1 week. No sexual activity for 1 week. Keep procedure site clean & dry. If you notice increased pain, swelling, bleeding or pus, call/return!  You may shower, but no soaking baths/hot tubs/pools for 1 week.   Increase activity slowly   Complete by: As directed         Discharge Medications   Allergies as of 05/03/2023   No Known Allergies      Medication List     STOP taking these medications    esomeprazole 20 MG capsule Commonly known as: NEXIUM       TAKE these medications    acetaminophen 500 MG tablet Commonly known as: TYLENOL Take 500-1,000 mg by mouth 2 (two) times daily as needed (pain.).   aspirin EC 81 MG tablet Take 81 mg by mouth in the morning.   betamethasone dipropionate 0.05 % cream Apply 0.5 application  topically 2 (two) times daily as needed (dry/irritated skin).   CAL-MAG PO Take 1 tablet by mouth in the morning. With Potassium   CHOLECALCIFEROL PO Take 2-3 capsules by mouth in the morning.   clopidogrel 75 MG tablet Commonly known as: PLAVIX Take 1 tablet (75 mg total) by mouth daily with breakfast. Start taking on: May 04, 2023   CO Q 10 PO Take 400 mg by mouth in the morning.   finasteride 5 MG tablet Commonly known as: PROSCAR  Take 5 mg by mouth in the  morning.   furosemide 20 MG tablet Commonly known as: LASIX Take 1 tablet (20 mg total) by mouth as needed (For weight gain of 3 lbs in 24 hours or 5 lbs in a week). For weight gain of 3 lbs in 24 hours or 5 lbs in a week   gabapentin 600 MG tablet Commonly known as: NEURONTIN TAKE 1 TABLET AT 4PM AND TAKE 1 TABLET AT BEDTIME.   gabapentin 100 MG capsule Commonly known as: Neurontin Take 1 capsule (100 mg total) by mouth daily as needed.   Magnesium Oxide 400 MG Caps Take 1 capsule (400 mg total) by mouth daily.   metoprolol succinate 25 MG 24 hr tablet Commonly known as: Toprol XL Take 1 tablet (25 mg total) by mouth daily. What changed:  when to take this reasons to take this   multivitamin with minerals Tabs tablet Take 1 tablet by mouth daily. Centrum Silver   nitroGLYCERIN 0.4 MG SL tablet Commonly known as: NITROSTAT ONE TABLET UNDER TONGUE WHEN NEEDED FOR CHEST PAIN. MAY REPEAT IN 5 MINUTES.   pantoprazole 40 MG tablet Commonly known as: Protonix Take 1 tablet (40 mg total) by mouth daily.   PreserVision AREDS 2 Caps Take 1 capsule by mouth in the morning and at bedtime.   PROBIOTIC-10 PO Take 1 capsule by mouth in the morning.   QC Tumeric Complex 500 MG Caps Generic drug: Turmeric Take 500 mg by mouth in the morning.   Refresh 1.4-0.6 % Soln Generic drug: Polyvinyl Alcohol-Povidone PF Place 1-2 drops into both eyes 3 (three) times daily as needed (dry/irritated eyes.).   Restasis 0.05 % ophthalmic emulsion Generic drug: cycloSPORINE Place 1 drop into both eyes 2 (two) times daily as needed (dry/irritated eyes.).   rosuvastatin 5 MG tablet Commonly known as: CRESTOR TAKE 1 TABLET ONCE DAILY.   Saw Palmetto 160 MG Caps Take 160 mg by mouth 2 (two) times daily.   tamsulosin 0.4 MG Caps capsule Commonly known as: FLOMAX Take 0.4 mg by mouth in the morning and at bedtime.   traMADol 50 MG tablet Commonly known as: ULTRAM Take 50 mg by mouth 3  (three) times daily as needed (back pain.).   traZODone 50 MG tablet Commonly known as: DESYREL Take 25 mg by mouth at bedtime as needed for sleep.   zolpidem 10 MG tablet Commonly known as: AMBIEN Take 10 mg by mouth at bedtime as needed for sleep.           Outstanding Labs/Studies     Duration of Discharge Encounter   Greater than 30 minutes including physician time.  Signed, Roe Rutherford Ahri Olson, PA 05/03/2023, 11:22 AM

## 2023-05-03 NOTE — Progress Notes (Signed)
CARDIAC REHAB PHASE I   PRE:  Rate/Rhythm: 70  SR   BP:  Sitting: 118/95      SaO2: 98 RA   MODE:  Ambulation: 240 ft   POST:  Rate/Rhythm: 78 SR   BP:  Sitting: 135/95      SaO2: 98 RA  Pt ambulated independently in hallway. Tolerated well with no CP, dizziness, or SOB. Returned to bed with call bell and bedside table in reach. Post stent education including site care, restrictions, risk factors, exercise guidelines, NTG use, antiplatelet therapy importance, heart healthy diabetic diet, and CRP2 reviewed. All questions and concerns addressed. Will refer to [facility] for CRP2. Plan for home later today.   [id number]  Woodroe Chen, RN BSN 05/03/2023 9:13 AM

## 2023-05-03 NOTE — Plan of Care (Signed)

## 2023-05-03 NOTE — Progress Notes (Addendum)
Rounding Note    Patient Name: Danny [name] Date of Encounter: 05/03/2023   [facility] Cardiologist: Marca Ancona, MD   Subjective   Found sitting in bed. No complaints, has walked in the hall. Ready for discharge.  Inpatient Medications    Scheduled Meds:  aspirin  81 mg Oral Daily   clopidogrel  75 mg Oral Q breakfast   sodium chloride flush  3 mL Intravenous Q12H   traMADol  50 mg Oral Q6H   zolpidem  5 mg Oral QHS   Continuous Infusions:  sodium chloride     PRN Meds: sodium chloride, acetaminophen, ondansetron (ZOFRAN) IV, sodium chloride flush   Vital Signs    Vitals:   05/02/23 2042 05/02/23 2321 05/03/23 0400 05/03/23 0903  BP: 116/76 99/68 (!) 118/95 (!) 135/95  Pulse: 64 64 66 68  Resp: 18 16 19 17   Temp: 98.4 F (36.9 C) 98.6 F (37 C) 98.7 F (37.1 C) 98.3 F (36.8 C)  TempSrc: Oral Oral Oral Oral  SpO2: 97% 96% 98% 96%  Weight:      Height:        Intake/Output Summary (Last 24 hours) at 05/03/2023 0938 Last data filed at 05/02/2023 1641 Gross per 24 hour  Intake 120.09 ml  Output --  Net 120.09 ml      05/02/2023   12:45 PM 04/30/2023    8:51 AM 04/17/2023   11:09 AM  Last 3 Weights  Weight (lbs) 165 lb 164 lb 162 lb 6.4 oz  Weight (kg) 74.844 kg 74.39 kg 73.664 kg      Telemetry    Sinus rhythm in the 70s, long PR - Personally Reviewed  ECG    No new tracings - Personally Reviewed  Physical Exam   GEN: No acute distress.   Neck: No JVD Cardiac: RRR, 4/6 systolic murmur Respiratory: Clear to auscultation bilaterally. GI: Soft, nontender, non-distended  MS: No edema; No deformity. Neuro:  Nonfocal  Psych: Normal affect  Right radial site with bruising, C/D/I  Labs    High Sensitivity Troponin:  No results for input(s): "TROPONINIHS" in the last 720 hours.   Chemistry Recent Labs  Lab 05/02/23 1352 05/02/23 1353 05/03/23 0359  NA 141 140 136  K 3.5 3.7 4.0  CL  --   --  106  CO2  --   --  21*   GLUCOSE  --   --  79  BUN  --   --  14  CREATININE  --   --  0.83  CALCIUM  --   --  8.9  GFRNONAA  --   --  >60  ANIONGAP  --   --  9    Lipids No results for input(s): "CHOL", "TRIG", "HDL", "LABVLDL", "LDLCALC", "CHOLHDL" in the last 168 hours.  Hematology Recent Labs  Lab 05/02/23 1352 05/02/23 1353 05/03/23 0359  WBC  --   --  8.2  RBC  --   --  4.06*  HGB 11.9* 12.6* 12.3*  HCT 35.0* 37.0* 37.0*  MCV  --   --  91.1  MCH  --   --  30.3  MCHC  --   --  33.2  RDW  --   --  13.4  PLT  --   --  174   Thyroid No results for input(s): "TSH", "FREET4" in the last 168 hours.  BNPNo results for input(s): "BNP", "PROBNP" in the last 168 hours.  DDimer No results for input(s): "  DDIMER" in the last 168 hours.   Radiology    CARDIAC CATHETERIZATION  Result Date: 05/02/2023   Mid LAD lesion is 95% stenosed.   A stent was successfully placed.   Post intervention, there is a 0% residual stenosis. 1.  High-grade mid LAD lesion treated with OCT guided PCI with Cutting Balloon angioplasty and drug-eluting stent x 1. Recommendation: Continue evaluation for aortic valve intervention.   CARDIAC CATHETERIZATION  Result Date: 05/02/2023   Mid LAD lesion is 95% stenosed.   Mid RCA lesion is 25% stenosed. 1. Normal filling pressures. 2. 95% focal proximal LAD stenosis after 2nd diagonal. Discussed with Dr. Lynnette Caffey, given significant exertional dyspnea as possible anginal equivalent, will plan PCI to LAD today.    Cardiac Studies   Coronary stent intervention 05/02/23:   Mid LAD lesion is 95% stenosed.   A stent was successfully placed.   Post intervention, there is a 0% residual stenosis.   1.  High-grade mid LAD lesion treated with OCT guided PCI with Cutting Balloon angioplasty and drug-eluting stent x 1.   Recommendation: Continue evaluation for aortic valve intervention.   R/L [facility] 05/02/23:   Mid LAD lesion is 95% stenosed.   Mid RCA lesion is 25% stenosed.   1. Normal filling  pressures.  2. 95% focal proximal LAD stenosis after 2nd diagonal.    Discussed with Dr. Lynnette Caffey, given significant exertional dyspnea as possible anginal equivalent, will plan PCI to LAD today.   Patient Profile     87 y.o. male with aortic stenosis, CAD now s/p DES-mLAD, HLD, peripheral neuropathy who presented for heart cath to evaluate ongoing DOE despite lasix therapy.  Assessment & Plan    Coronary artery disease - heart cath yesterday with 95% stenosis in the mid LAD tratedw with OCT guided cutting balloon angioplasty and DES x 1 - 3.0 x 16 mm - placed on ASA and plavix - resume toprol and crestor   Aortic stenosis - moderate - continue workup for TAVR - followed by structural heart team   Hyperlipidemia with LDL goal < 70 08/23/2022: Cholesterol 130; HDL 59; LDL Cholesterol 56; Triglycerides 73; VLDL 15 Crestor 5 mg   Plan for discharge today,      For questions or updates, please contact [facility] Please consult [email or web address] for contact info under        Signed, Marcelino Duster, PA  05/03/2023, 9:38 AM     Patient seen and examined. Agree with assessment and plan.  Patient feels well without chest pain or shortness of breath.  Rate in the 60s.  Blood pressure stable.  No JVD.  Lungs clear.  3/6 late peaking systolic murmur consistent with aortic stenosis.  Nontender abdomen.  Right radial site stable with mild ecchymosis.  No edema.  Patient is status post successful stenting of mid LAD with ultimate insertion of 3.0 x 16 mm DES stent.  Patient is on DAPT with aspirin/Plavix.  Continue rosuvastatin and resume metoprolol succinate.  Patient will be undergoing structural heart team evaluation for TAVR.  Okay for discharge today.   Lennette Bihari, MD, [facility] 05/03/2023 11:07 AM

## 2023-05-03 NOTE — TOC CM/SW Note (Signed)
Transition of Care [facility]) - Inpatient Brief Assessment   Patient Details  Name: Danny [name] MRN: [id number] Date of Birth: [birth year]  Transition of Care [facility]) CM/SW Contact:    Gala Lewandowsky, RN Phone Number: 05/03/2023, 10:49 AM   Clinical Narrative: Patient presented for TAVR workup-post R/LHC. Case Manager will continue to follow for transition of care needs as the patient progresses.     Transition of Care Asessment: Insurance and Status: Insurance coverage has been reviewed Patient has primary care physician: Yes Prior/Current Home Services: No current home services Social Determinants of Health Reivew: SDOH reviewed no interventions necessary Readmission risk has been reviewed: Yes Transition of care needs: no transition of care needs at this time

## 2023-05-07 ENCOUNTER — Telehealth (HOSPITAL_COMMUNITY): Payer: Self-pay

## 2023-05-07 LAB — LIPOPROTEIN A (LPA): Lipoprotein (a): 95.7 nmol/L — ABNORMAL HIGH (ref <75.0)

## 2023-05-07 NOTE — Telephone Encounter (Signed)
Pt insurance is active and benefits verified through Medicare a/b Co-pay 0, DED $240/$240 met, out of pocket 0/0 met, co-insurance 20%. no pre-authorization required. Passport, 05/07/2023@1 :55, REF# [id number]  2ndary insurance is active and benefits verified through [facility]. Co-pay 0, DED 0/0 met, out of pocket 0/0 met, co-insurance 0%. No pre-authorization required.   How many CR sessions are covered? (for ICR)72 Is this a lifetime maximum or an annual maximum? Lifetime Has the member used any of these services to date? no Is there a time limit (weeks/months) on start of program and/or program completion? no   Will contact patient to see if he is interested in the Cardiac Rehab Program. If interested, patient will need to complete follow up appt. Once completed, patient will be contacted for scheduling upon review by the RN Navigator.

## 2023-05-09 ENCOUNTER — Telehealth (HOSPITAL_COMMUNITY): Payer: Self-pay

## 2023-05-09 NOTE — Telephone Encounter (Signed)
Received a fax requesting medical records from [facility](Atten: Eleanora Neighbor M.D.. Records were successfully faxed to: [phone] ,which was the number provided.. Medical request form will be scanned into patients chart.

## 2023-05-10 ENCOUNTER — Ambulatory Visit (HOSPITAL_COMMUNITY)
Admission: RE | Admit: 2023-05-10 | Discharge: 2023-05-10 | Disposition: A | Discharge status: Home / Self Care | Payer: Medicare Other | Source: Ambulatory Visit

## 2023-05-10 DIAGNOSIS — R932 Abnormal findings on diagnostic imaging of liver and biliary tract: Secondary | ICD-10-CM | POA: Diagnosis not present

## 2023-05-10 DIAGNOSIS — R918 Other nonspecific abnormal finding of lung field: Secondary | ICD-10-CM | POA: Diagnosis not present

## 2023-05-10 DIAGNOSIS — Z006 Encounter for examination for normal comparison and control in clinical research program: Secondary | ICD-10-CM

## 2023-05-10 DIAGNOSIS — Z01818 Encounter for other preprocedural examination: Secondary | ICD-10-CM | POA: Diagnosis not present

## 2023-05-10 DIAGNOSIS — I350 Nonrheumatic aortic (valve) stenosis: Secondary | ICD-10-CM

## 2023-05-10 MED ORDER — IOHEXOL 350 MG/ML SOLN
100.0000 mL | Freq: Once | INTRAVENOUS | Status: AC | PRN
  Administered 2023-05-10: 100 mL via INTRAVENOUS

## 2023-05-13 DIAGNOSIS — K5732 Diverticulitis of large intestine without perforation or abscess without bleeding: Secondary | ICD-10-CM | POA: Diagnosis not present

## 2023-05-13 DIAGNOSIS — R1030 Lower abdominal pain, unspecified: Secondary | ICD-10-CM | POA: Diagnosis not present

## 2023-05-14 ENCOUNTER — Telehealth (HOSPITAL_COMMUNITY): Payer: Self-pay

## 2023-05-14 NOTE — Telephone Encounter (Signed)
Called to confirm appointment. Patient reminded to bring all medications and pill box organizer with them. Confirmed patient has transportation. Gave directions, instructed to utilize valet parking.  Confirmed appointment prior to ending call.

## 2023-05-15 ENCOUNTER — Ambulatory Visit (HOSPITAL_COMMUNITY)
Admission: RE | Admit: 2023-05-15 | Discharge: 2023-05-15 | Disposition: A | Discharge status: Home / Self Care | Payer: Medicare Other | Source: Ambulatory Visit | Attending: Cardiology | Admitting: Cardiology

## 2023-05-15 ENCOUNTER — Encounter (HOSPITAL_COMMUNITY): Payer: Self-pay

## 2023-05-15 VITALS — BP 122/88 | HR 59 | Wt 164.2 lb

## 2023-05-15 DIAGNOSIS — Z7982 Long term (current) use of aspirin: Secondary | ICD-10-CM | POA: Insufficient documentation

## 2023-05-15 DIAGNOSIS — I7121 Aneurysm of the ascending aorta, without rupture: Secondary | ICD-10-CM | POA: Insufficient documentation

## 2023-05-15 DIAGNOSIS — E785 Hyperlipidemia, unspecified: Secondary | ICD-10-CM | POA: Insufficient documentation

## 2023-05-15 DIAGNOSIS — K5732 Diverticulitis of large intestine without perforation or abscess without bleeding: Secondary | ICD-10-CM | POA: Diagnosis not present

## 2023-05-15 DIAGNOSIS — I5032 Chronic diastolic (congestive) heart failure: Secondary | ICD-10-CM | POA: Insufficient documentation

## 2023-05-15 DIAGNOSIS — I350 Nonrheumatic aortic (valve) stenosis: Secondary | ICD-10-CM | POA: Insufficient documentation

## 2023-05-15 DIAGNOSIS — Z9861 Coronary angioplasty status: Secondary | ICD-10-CM | POA: Diagnosis not present

## 2023-05-15 DIAGNOSIS — Z7902 Long term (current) use of antithrombotics/antiplatelets: Secondary | ICD-10-CM | POA: Diagnosis not present

## 2023-05-15 DIAGNOSIS — G629 Polyneuropathy, unspecified: Secondary | ICD-10-CM | POA: Insufficient documentation

## 2023-05-15 DIAGNOSIS — Z955 Presence of coronary angioplasty implant and graft: Secondary | ICD-10-CM | POA: Insufficient documentation

## 2023-05-15 DIAGNOSIS — K5730 Diverticulosis of large intestine without perforation or abscess without bleeding: Secondary | ICD-10-CM | POA: Diagnosis not present

## 2023-05-15 DIAGNOSIS — I2510 Atherosclerotic heart disease of native coronary artery without angina pectoris: Secondary | ICD-10-CM | POA: Insufficient documentation

## 2023-05-15 NOTE — Progress Notes (Addendum)
Patient ID: Danny [name], male   DOB: 30-May-1936, 87 y.o.   MRN: [id number] PCP: Dr. Pete Glatter Cardiology: Dr. Shirlee Latch  87 y.o. yo with history of nonobstructive CAD presents for followup of CAD and aortic stenosis.  He last had an ETT-Cardiolite in 6/16 that was normal.  Echo in 9/17 showed EF 55-60%, mild aortic stenosis, 4.0 cm ascending aorta.  MRA chest in 12/18 showed tortuous thoracic aorta without aneurysm. Echo in 11/19 showed EF 60-65%, mild AS/mild AI.  Echo in 12/21 showed EF 60-65% with mild AI, mild AS.  PYP scan in 12/21 was grade 1 with H/CL 1.15, probably negative for TTR cardiac amyloidosis.   Cardiolite in 1/23 showed EF 68%, no ischemia/infarction.   Patient has been doing well from a cardiac standpoint.  He has had no exertional dyspnea or chest pain.  He has right knee pain from OA, bothers him when he golfs.  No palpitations. SBP 110s-120s at home.   Echo 12/23 showed EF 60-65%, RV normal, moderate aortic stenosis, AVA 0.81 cm, AoV mean gradient 26 mmHg, peak gradient 41 mmHg.  Echo 7/24, EF 60-65%, RV normal, Mod AS VTI 1.10 cm2, mean gradient .   Recent admission 8/24 for [facility] as part of w/u for progressive DOE/ further evaluation of AS. Cath demonstrated 95% stenosis in the mid LAD treated with OCT guided cutting balloon angioplasty and DES x 1 - 3.0 x 16 mm. RHC demonstrated normal filling pressures and CO. Referred to structural heart clinic for evaluation for TAVR.   He presents today for post hospital f/u. Here w/ wife. Reports doing fairly well from a cardiac standpoint w/ improvement in exertional dyspnea. Denies cardiac chest pain. He developed a rt forearm hematoma post cath but this is healing well. Ecchymosis improving. 2+ radial pulse. Compliant w/ medications. BP and HR both well controlled. Only issue is bout of severe abdominal pain post d/c. Went to urgent care and treated for presumed diverticulitis, which he has a history of. Luckily, no associated  GI bleeding, denying hematochezia. Also denies melena. Labs were checked at urgent care and Hgb was stable compared to hospital level, 12.7. WBC mildly elevated at 13. He was placed on an abx w/ marked improvement in abdominal pain, down from 8/10>>1/10 today. He has f/u w/ provider later today.   He has consultation scheduled w/ Dr. Leafy Ro on 9/16 as part of multidisciplinary evaluation of his aortic stenosis. Dr. Lynnette Caffey also following.   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   [facility] 8/24    Mid LAD lesion is 95% stenosed.   Mid RCA lesion is 25% stenosed.   1. Normal filling pressures.  2. 95% focal proximal LAD stenosis after 2nd diagonal.      Right Heart Pressures RHC Procedural Findings: Hemodynamics (mmHg) RA mean 1 RV 29/3 PA 27/8, mean 16 PCWP mean 5 AO 137/71  Oxygen saturations: PA 71% AO 94%  Cardiac Output (Fick) 5.91  Cardiac Index (Fick) 3.08      ECG (personally reviewed): NSR, 1st degree AVB 61 bpm  Labs (8/14): K 3.9, creatinine 0.89 Labs (12/14): LDL 62, HDL 48   Labs (6/16): LDL 56 Labs (1/17): creatinine 0.88, HCT 43.9 Labs (8/17): K 4.3, creatinine 0.88, LFTs normal Labs (9/17): CK normal, Mg normal Labs (11/17): LDL 32, HDL 52 Labs (11/18): LDL 50 Labs (11/19): LDL 57, HDL 53, K 4, creatinine 0.82 Labs (3/21): LDL 53 Labs (4/21): K 3.8, creatinine 0.83, myeloma  panel negative, urine immunofixation negative, LDL 68 Labs (11/22): LDL 45 Labs (12/23): K 4.3, creatinine 0.98, LDL 56 Labs (8/24): K 4.0, creatinine 0.83, Hgb 12.3, Lipoprotein A 95.7    PMH: 1. Rotator cuff surgery 2. Diverticulosis 3. Chronic persistent hepatitis 4. Impaired fasting glucose 5. 6th nerve palsy in 2001 6. Allergic rhinitis 7. Low back pain s/p several operations.  Most recently had microdiscectomy in 1/17.  8. CAD: Patient developed exertional dyspnea.  ETT-myoview (8/12) with 11 METS, normal LV EF, normal perfusion at  rest and stress but elevated TID ratio.  Coronary CT angiogram in 9/12 showed equivocal significant disease in the CFX.  Cath in 10/12 showed EF 55-60%, 30% mid RCA, 40% distal RCA, 40% proximal LAD.  ETT-Cardiolite in 10/14 showed EF 62%, probably normal study with diaphragmatic attenuation.   Echo (4/15) with EF 55-60%, mild MR.  - Cardiolite (6/16) with no ischemia/infarction.  - Echo (9/17) with EF 55-60%, mild aortic stenosis, 4.0 cm ascending aorta.  - Echo (11/19): EF 60-65%, mild AS mean gradient 9 mmHg with AVA 1.53 cm^2, mild AI.  - Echo (12/21): EF 60-65%, mild AS, mild AI.  - Cardiolite (1/23): EF 68%, no ischemia/infarction. - LHC (8/24): 95% mLAD s/p PCI + DES + 25% mRCA disease  9. GERD 10. Hyperlipidemia: Myalgias with atorvastatin.  11. Aortic stenosis: Mild on 9/17 echo. Mild on 11/19 echo. Mild on 12/21 echo. - moderate on 12/23 echo - moderate on 8/24 echo, mGradient 33 mmgH  12. Ascending aorta dilation: MRA chest (12/18) showed a tortuous ascending aorta without aneurysmal dilation.  13. Peripheral neuropathy of uncertain etiology.  14. PYP scan (12/21): grade 1, H/CL 1.15 (probably negative)  FH: Father with CVA at 46  SH: Married, never smoked, rare ETOH.   ROS: All systems reviewed and negative except as per HPI.   Current Outpatient Medications  Medication Sig Dispense Refill   aspirin EC 81 MG tablet Take 81 mg by mouth in the morning.     betamethasone dipropionate 0.05 % cream Apply 0.5 application  topically 2 (two) times daily as needed (dry/irritated skin).     Calcium-Magnesium (CAL-MAG PO) Take 1 tablet by mouth in the morning. With Potassium     CHOLECALCIFEROL PO Take 2-3 capsules by mouth in the morning.     clopidogrel (PLAVIX) 75 MG tablet Take 1 tablet (75 mg total) by mouth daily with breakfast. 90 tablet 3   Coenzyme Q10 (CO Q 10 PO) Take 400 mg by mouth in the morning.     finasteride (PROSCAR) 5 MG tablet Take 5 mg by mouth in the morning.      furosemide (LASIX) 20 MG tablet Take 1 tablet (20 mg total) by mouth as needed (For weight gain of 3 lbs in 24 hours or 5 lbs in a week). For weight gain of 3 lbs in 24 hours or 5 lbs in a week 30 tablet 0   gabapentin (NEURONTIN) 600 MG tablet TAKE 1 TABLET AT 4PM AND TAKE 1 TABLET AT BEDTIME. 180 tablet 3   Magnesium Oxide 400 MG CAPS Take 1 capsule (400 mg total) by mouth daily. 30 capsule 11   metoprolol succinate (TOPROL XL) 25 MG 24 hr tablet Take 1 tablet (25 mg total) by mouth daily. (Patient taking differently: Take 25 mg by mouth daily as needed (chest tightness/afib).) 30 tablet 3   Multiple Vitamin (MULTIVITAMIN WITH MINERALS) TABS tablet Take 1 tablet by mouth daily. Centrum Silver  Multiple Vitamins-Minerals (PRESERVISION AREDS 2) CAPS Take 1 capsule by mouth in the morning and at bedtime.     nitroGLYCERIN (NITROSTAT) 0.4 MG SL tablet ONE TABLET UNDER TONGUE WHEN NEEDED FOR CHEST PAIN. MAY REPEAT IN 5 MINUTES. 25 tablet 6   pantoprazole (PROTONIX) 40 MG tablet Take 1 tablet (40 mg total) by mouth daily. 90 tablet 3   Polyvinyl Alcohol-Povidone PF (REFRESH) 1.4-0.6 % SOLN Place 1-2 drops into both eyes 3 (three) times daily as needed (dry/irritated eyes.).     Probiotic Product (PROBIOTIC-10 PO) Take 1 capsule by mouth in the morning.     RESTASIS 0.05 % ophthalmic emulsion Place 1 drop into both eyes 2 (two) times daily as needed (dry/irritated eyes.).     rosuvastatin (CRESTOR) 5 MG tablet TAKE 1 TABLET ONCE DAILY. 90 tablet 0   Saw Palmetto 160 MG CAPS Take 160 mg by mouth 2 (two) times daily.     tamsulosin (FLOMAX) 0.4 MG CAPS capsule Take 0.4 mg by mouth in the morning and at bedtime.     traMADol (ULTRAM) 50 MG tablet Take 50 mg by mouth 3 (three) times daily as needed (back pain.).     traZODone (DESYREL) 50 MG tablet Take 25 mg by mouth at bedtime as needed for sleep.     Turmeric (QC TUMERIC COMPLEX) 500 MG CAPS Take 500 mg by mouth in the morning.     zolpidem (AMBIEN)  10 MG tablet Take 10 mg by mouth at bedtime as needed for sleep.     No current facility-administered medications for this encounter.   Wt Readings from Last 3 Encounters:  05/15/23 74.5 kg (164 lb 3.2 oz)  05/02/23 74.8 kg (165 lb)  04/30/23 74.4 kg (164 lb)   BP 122/88   Pulse (!) 59   Wt 74.5 kg (164 lb 3.2 oz)   SpO2 96%   BMI 23.56 kg/m  PHYSICAL EXAM: General:  Well appearing. No respiratory difficulty HEENT: normal Neck: supple. no JVD. Carotids 2+ bilat; no bruits. No lymphadenopathy or thyromegaly appreciated. Cor: PMI nondisplaced. Regular rate & rhythm. 3/6 AS murmur loudest at RUSB  Lungs: clear Abdomen: soft, nontender, nondistended. No hepatosplenomegaly. No bruits or masses. Good bowel sounds. Extremities: no cyanosis, clubbing, rash, edema Neuro: alert & oriented x 3, cranial nerves grossly intact. moves all 4 extremities w/o difficulty. Affect pleasant.   Assessment/Plan:   1. CAD: LHC (8/24), 95% mLAD + 25% mRCA, s/p PCI + DES to LAD. Stable w/o CP   - Continue ASA 81 mg + Plavix 75 mg daily  - Continue Crestor 5 mg daily  - Continue Toprol XL 25 mg daily  2. Aortic stenosis: Mild on 12/21 echo.  Moderate on echo 7/24, mean gradient 33 mmHg - has been referred to Dr. Lynnette Caffey and Dr. Leafy Ro for multidisciplinary evaluation for possible TAVR  - Could consider him for PROGRESS CAP trial 3. Diastolic HF: most recent echo 8/65 LVEF 60-65% w/ borderline LVH, normal RV, mod AS. PYP 12/21 was not suggestive of TTR cardiac amyloidosis. Euvolemic on exam. NYHA Class II - continue Lasix PRN  4. Hyperlipidemia: Goal LDL < 70 with known CAD. Most recent LDL 56 mg/dL. Lipoprotein A elevated at 95. Myalgias with atorvastatin, now tolerating low dose Crestor.  - continue Crestor 5 mg daily   5. Ascending aortic aneurysm: Mild, 4.0 cm on 9/17 echo. MRA chest in 12/18 showed tortuous aorta without frank aneurysmal dilation.  Stable at 4.0 cm on f/u echo 7/24.  Getting pre-TAVR  CT Scans and pending evaluation by Dr. Leafy Ro   6. Peripheral neuropathy: polyneuropathy of uncertain etiology.  Also with history of aortic stenosis and LVH on last echo. Transthyretin amyloidosis is a concern with this clinical presentation, but PYP scan was not suggestive of TTR cardiac amyloidosis. Myeloma workup also was negative.  7. Acute Diverticulosis: reports marked improvement in abdominal pain since starting abx therapy. Luckily, no associated GI bleeding, denying hematochezia. Also denies melena. Labs were checked at urgent care earlier this week and Hgb was stable compared to hospital level, 12.7. We discussed need for uninterrupted DAPT w/ ASA and Plavix given fresh LAD stent and need for cardiologist input regarding DAPT, in the event that bleeding ever occurs, prior to ever being stopped/held. He and his wife both verbalized understanding.   F/u w/ Dr. Shirlee Latch in 6 wks    Robbie Lis PA-C  05/15/2023

## 2023-05-15 NOTE — Patient Instructions (Addendum)
No Labs done today.   No medication changes were made. Please continue all current medications as prescribed.  Your physician recommends that you schedule a follow-up appointment in: 6-8 weeks  If you have any questions or concerns before your next appointment please send [place] a message through [place] or call our office at [phone].    TO LEAVE A MESSAGE FOR THE NURSE SELECT OPTION 2, PLEASE LEAVE A MESSAGE INCLUDING: YOUR NAME DATE OF BIRTH CALL BACK NUMBER REASON FOR CALL**this is important as we prioritize the call backs  YOU WILL RECEIVE A CALL BACK THE SAME DAY AS LONG AS YOU CALL BEFORE 4:00 PM   Do the following things EVERYDAY: Weigh yourself in the morning before breakfast. Write it down and keep it in a log. Take your medicines as prescribed Eat low salt foods--Limit salt (sodium) to 2000 mg per day.  Stay as active as you can everyday Limit all fluids for the day to less than 2 liters   At the [facility], you and your health needs are our priority. As part of our continuing mission to provide you with exceptional heart care, we have created designated Provider Care Teams. These Care Teams include your primary Cardiologist (physician) and Advanced Practice Providers (APPs- Physician Assistants and Nurse Practitioners) who all work together to provide you with the care you need, when you need it.   You may see any of the following providers on your designated Care Team at your next follow up: Dr Arvilla Meres Dr Marca Ancona Dr. Marcos Eke, NP Robbie Lis, [place] [facility] [place], [place] Brynda Peon, NP Karle Plumber, PharmD   Please be sure to bring in all your medications bottles to every appointment.    Thank you for choosing [facility]

## 2023-05-23 ENCOUNTER — Telehealth: Payer: Self-pay | Admitting: Physician Assistant

## 2023-05-23 NOTE — Telephone Encounter (Signed)
  [facility]   MULTIDISCIPLINARY HEART VALVE TEAM   Patient recently had severe abdominal pain and seen by PCP. Treated with Augmentin for diverticulitis and reportedly had several laboratory abnormalities. He wanted to know if he needed to get follow up labs before upcoming TAVR. I told him I did not think that was necessary as long as he was feeling better and not having any fevers or pain. He will see Dr. Leafy Ro on 06/03/23. We will also get lab work as a part of his preadmission testing.  Cline Crock PA-C  [facility]

## 2023-06-02 NOTE — Progress Notes (Unsigned)
[address]       [place] [address]             [phone]           CHOL GRAFFIUS [facility] Medical Record #[id number] Date of Birth: 04-29-1936  Laurey Morale, MD [name], MD  Chief Complaint:  increasing SOB   History of Present Illness:     Pt is an 87 yo male who has been followed by Dr Shirlee Latch for some time and has been suffering from progressive aortic stenosis. Pt has had a work up for cardiac amyloidosis in the past but was equivicol. Pt has been suffering from increasing DOE and has had issues with being able to do his activities like golfing without having to stop and rest. Pt underwent echo with an LV EF of 60% and with moderate AS with a  mean gradient of 33 mmHg and an AVA of 1.1cm2. Pt was felt to be symptomatic from this AS and underwent in work up a cath with a significant LAD lesion that was treated with stenting. Pt feels like that has not had any improvement in his DOE. He denies lightheadedness or CP. Pt underwent TAVR CTA and has acceptable anatomy for a 26 mm sapien 3 valve via femoral access. He has been accepted into the PROGRESS trial for moderate symptomatic AS pts.      Past Medical History:  Diagnosis Date   Allergic rhinitis    Arthritis    BPH (benign prostatic hypertrophy)    Dr. Laverle Patter   Cervical disc disease    Coronary artery disease    Patient developed exertional dyspnea. ETT-myoview (8/12) with 11 METS, normal LV EF, normal perfusion at rest and stress but elevated TID ratio. Coronary CT angiogram in 9/12 showed equivocal significant disease in the CFX. Cath in 10/12 showed EF 55-60%, 30% mid RCA, 40% distal RCA, 40% proximal LAD. ETT-Myoview (10/14): Diaphragmatic attenuation, no ischemia, EF 62%, normal study   Detached vitreous humor    Diverticulosis    GERD (gastroesophageal reflux disease)    Hemorrhoids    Impaired fasting glucose    Ingrown toenail    Lumbar radiculopathy    Numbness and  tingling in left arm    Onychomycosis    Rotator cuff disorder    Sixth nerve palsy 09/18/1999    Past Surgical History:  Procedure Laterality Date   abnormal stress test     APPENDECTOMY  09/18/1955   BACK SURGERY  [id number]   lumbar fusion-   CARDIAC CATHETERIZATION     COLONOSCOPY  2017   CORONARY STENT INTERVENTION [name] 05/02/2023   Procedure: CORONARY STENT INTERVENTION;  Surgeon: Orbie Pyo, MD;  Location: [facility] INVASIVE CV LAB;  Service: Cardiovascular;  Laterality: [name];   HEMORRHOID SURGERY  09/18/1963   HERNIA REPAIR  09/17/1994   RIH   LIPOMA EXCISION  09/05/2011   Procedure: EXCISION LIPOMA;  Surgeon: Currie Paris, MD;  Location: [facility];  Service: General;  Laterality: Left;  removal of 4 cm lipoma of back   LUMBAR LAMINECTOMY/DECOMPRESSION MICRODISCECTOMY Right 10/05/2015   Procedure: Extraforaminal Microdiscectomy  - L4-L5 - L5-S1 - right;  Surgeon: Tia Alert, MD;  Location: [facility]UR[facility];  Service: Neurosurgery;  Laterality: Right;  Extraforaminal Microdiscectomy  - L4-L5 - L5-S1 - right   melonoma Right    removed from right arm   RIGHT HEART CATH AND CORONARY ANGIOGRAPHY [name]  05/02/2023   Procedure: RIGHT HEART CATH AND CORONARY ANGIOGRAPHY;  Surgeon: Laurey Morale, MD;  Location: [facility] INVASIVE CV LAB;  Service: Cardiovascular;  Laterality: [name];   RIGHT/LEFT HEART CATH AND CORONARY ANGIOGRAPHY [name] 05/02/2023   Procedure: RIGHT/LEFT HEART CATH AND CORONARY ANGIOGRAPHY;  Surgeon: Laurey Morale, MD;  Location: [facility] INVASIVE CV LAB;  Service: Cardiovascular;  Laterality: [name];   ROTATOR CUFF REPAIR  07/17/2007   left   SPINE SURGERY  07/06/10 and 09/07/09   spinal fusion   TONSILLECTOMY AND ADENOIDECTOMY  09/17/1945    Social History   Tobacco Use  Smoking Status Never  Smokeless Tobacco Never    Social History   Substance and Sexual Activity  Alcohol Use Yes   Comment: occasionally    Social History   Socioeconomic History    Marital status: Married    Spouse name: Not on file   Number of children: Not on file   Years of education: Not on file   Highest education level: Not on file  Occupational History   Occupation: retired    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[place] [employer]  Tobacco Use   Smoking status: Never   Smokeless tobacco: Never  Vaping Use   Vaping status: Never Used  Substance and Sexual Activity   Alcohol use: Yes    Comment: occasionally   Drug use: No   Sexual activity: Not on file  Other Topics Concern   Not on file  Social History Narrative   Right handed   Lives with wife two story home   Social Determinants of Health   Financial Resource Strain: Not on file  Food Insecurity: No Food Insecurity (05/02/2023)   Hunger Vital Sign    Worried About Running Out of Food in the Last Year: Never true    Ran Out of Food in the Last Year: Never true  Transportation Needs: No Transportation Needs (05/02/2023)   PRAPARE - Administrator, Civil Service (Medical): No    Lack of Transportation (Non-Medical): No  Physical Activity: Not on file  Stress: Not on file  Social Connections: Not on file  Intimate Partner Violence: Not At Risk (05/02/2023)   Humiliation, Afraid, Rape, and Kick questionnaire    Fear of Current or Ex-Partner: No    Emotionally Abused: No    Physically Abused: No    Sexually Abused: No    No Known Allergies  Current Outpatient Medications  Medication Sig Dispense Refill   aspirin EC 81 MG tablet Take 81 mg by mouth in the morning.     betamethasone dipropionate 0.05 % cream Apply 0.5 application  topically 2 (two) times daily as needed (dry/irritated skin).     Calcium-Magnesium (CAL-MAG PO) Take 1 tablet by mouth in the morning. With Potassium     CHOLECALCIFEROL PO Take 2-3 capsules by mouth in the morning.     clopidogrel (PLAVIX) 75 MG tablet Take 1 tablet (75 mg total) by mouth daily with breakfast. 90 tablet 3   Coenzyme Q10  (CO Q 10 PO) Take 400 mg by mouth in the morning.     finasteride (PROSCAR) 5 MG tablet Take 5 mg by mouth in the morning.     furosemide (LASIX) 20 MG tablet Take 1 tablet (20 mg total) by mouth as needed (For weight gain of 3 lbs in 24 hours or 5 lbs in a week). For weight gain of 3 lbs in 24 hours or 5  lbs in a week 30 tablet 0   gabapentin (NEURONTIN) 600 MG tablet TAKE 1 TABLET AT 4PM AND TAKE 1 TABLET AT BEDTIME. 180 tablet 3   Magnesium Oxide 400 MG CAPS Take 1 capsule (400 mg total) by mouth daily. 30 capsule 11   metoprolol succinate (TOPROL XL) 25 MG 24 hr tablet Take 1 tablet (25 mg total) by mouth daily. (Patient taking differently: Take 25 mg by mouth daily as needed (chest tightness/afib).) 30 tablet 3   Multiple Vitamin (MULTIVITAMIN WITH MINERALS) TABS tablet Take 1 tablet by mouth daily. Centrum Silver     Multiple Vitamins-Minerals (PRESERVISION AREDS 2) CAPS Take 1 capsule by mouth in the morning and at bedtime.     nitroGLYCERIN (NITROSTAT) 0.4 MG SL tablet ONE TABLET UNDER TONGUE WHEN NEEDED FOR CHEST PAIN. MAY REPEAT IN 5 MINUTES. 25 tablet 6   pantoprazole (PROTONIX) 40 MG tablet Take 1 tablet (40 mg total) by mouth daily. 90 tablet 3   Polyvinyl Alcohol-Povidone PF (REFRESH) 1.4-0.6 % SOLN Place 1-2 drops into both eyes 3 (three) times daily as needed (dry/irritated eyes.).     Probiotic Product (PROBIOTIC-10 PO) Take 1 capsule by mouth in the morning.     RESTASIS 0.05 % ophthalmic emulsion Place 1 drop into both eyes 2 (two) times daily as needed (dry/irritated eyes.).     rosuvastatin (CRESTOR) 5 MG tablet TAKE 1 TABLET ONCE DAILY. 90 tablet 0   Saw Palmetto 160 MG CAPS Take 160 mg by mouth 2 (two) times daily.     tamsulosin (FLOMAX) 0.4 MG CAPS capsule Take 0.4 mg by mouth in the morning and at bedtime.     traMADol (ULTRAM) 50 MG tablet Take 50 mg by mouth 3 (three) times daily as needed (back pain.).     traZODone (DESYREL) 50 MG tablet Take 25 mg by mouth at bedtime  as needed for sleep.     Turmeric (QC TUMERIC COMPLEX) 500 MG CAPS Take 500 mg by mouth in the morning.     zolpidem (AMBIEN) 10 MG tablet Take 10 mg by mouth at bedtime as needed for sleep.     No current facility-administered medications for this visit.     Family History  Problem Relation Age of Onset   Cancer Mother        multiple myeloma   Hypertension Mother    Stroke Father    Heart attack Neg Hx        Physical Exam: Alert Teeth ok Lungs: Clear  Card: rr with 3/6 sem Ext: no edema     Diagnostic Studies & Laboratory data: I have personally reviewed the following studies and agree with the findings   TTE (03/2023) IMPRESSIONS     1. Left ventricular ejection fraction, by estimation, is 60 to 65%. Left  ventricular ejection fraction by 3D volume is 60 %. The left ventricle has  normal function. The left ventricle has no regional wall motion  abnormalities. Left ventricular diastolic   parameters were normal. The average left ventricular global longitudinal  strain is -20.6 %. The global longitudinal strain is normal.   2. Right ventricular systolic function is normal. The right ventricular  size is normal. There is normal pulmonary artery systolic pressure. The  estimated right ventricular systolic pressure is 18.5 mmHg.   3. The mitral valve is grossly normal. Trivial mitral valve  regurgitation. No evidence of mitral stenosis.   4. The aortic valve is tricuspid. There is severe calcifcation of the  aortic valve. Aortic valve regurgitation is mild. Moderate aortic valve  stenosis. Aortic valve area, by VTI measures 1.10 cm. Aortic valve mean  gradient measures 33.0 mmHg. Aortic  valve Vmax measures 3.68 m/s.   5. Aortic dilatation noted. There is borderline dilatation of the  ascending aorta, measuring 40 mm.   6. The inferior vena cava is normal in size with greater than 50%  respiratory variability, suggesting right atrial pressure of 3 mmHg.    FINDINGS   Left Ventricle: Left ventricular ejection fraction, by estimation, is 60  to 65%. Left ventricular ejection fraction by 3D volume is 60 %. The left  ventricle has normal function. The left ventricle has no regional wall  motion abnormalities. The average  left ventricular global longitudinal strain is -20.6 %. The global  longitudinal strain is normal. The global longitudinal strain is normal  despite suboptimal segment tracking. The left ventricular internal cavity  size was normal in size. There is  borderline left ventricular hypertrophy. Left ventricular diastolic  parameters were normal.   Right Ventricle: The right ventricular size is normal. No increase in  right ventricular wall thickness. Right ventricular systolic function is  normal. There is normal pulmonary artery systolic pressure. The tricuspid  regurgitant velocity is 1.97 m/s, and   with an assumed right atrial pressure of 3 mmHg, the estimated right  ventricular systolic pressure is 18.5 mmHg.   Left Atrium: Left atrial size was normal in size.   Right Atrium: Right atrial size was normal in size.   Pericardium: There is no evidence of pericardial effusion.   Mitral Valve: The mitral valve is grossly normal. Trivial mitral valve  regurgitation. No evidence of mitral valve stenosis.   Tricuspid Valve: The tricuspid valve is normal in structure. Tricuspid  valve regurgitation is trivial.   Aortic Valve: The aortic valve is tricuspid. There is severe calcifcation  of the aortic valve. Aortic valve regurgitation is mild. Aortic  regurgitation PHT measures 665 msec. Moderate aortic stenosis is present.  Aortic valve mean gradient measures 33.0  mmHg. Aortic valve peak gradient measures 54.1 mmHg. Aortic valve area, by  VTI measures 1.10 cm.   Pulmonic Valve: The pulmonic valve was normal in structure. Pulmonic valve  regurgitation is trivial.   Aorta: Aortic dilatation noted. There is borderline  dilatation of the  ascending aorta, measuring 40 mm.   Venous: The inferior vena cava is normal in size with greater than 50%  respiratory variability, suggesting right atrial pressure of 3 mmHg.   IAS/Shunts: The interatrial septum was not well visualized.     LEFT VENTRICLE  PLAX 2D  LVIDd:         4.70 cm         Diastology  LVIDs:         3.40 cm         LV e' medial:    7.29 cm/s  LV PW:         1.00 cm         LV E/e' medial:  11.8  LV IVS:        1.00 cm         LV e' lateral:   8.49 cm/s  LVOT diam:     2.20 cm         LV E/e' lateral: 10.1  LV SV:         98  LV SV Index:   51  2D  LVOT Area:     3.80 cm        Longitudinal                                 Strain                                 2D Strain GLS  -20.6 %  LV Volumes (MOD)               Avg:  LV vol d, MOD    80.1 ml  A2C:                           3D Volume EF  LV vol d, MOD    86.1 ml       LV 3D EF:    Left  A4C:                                        ventricul  LV vol s, MOD    40.0 ml                    ar  A2C:                                        ejection  LV vol s, MOD    34.7 ml                    fraction  A4C:                                        by 3D  LV SV MOD A2C:   40.1 ml                    volume is  LV SV MOD A4C:   86.1 ml                    60 %.  LV SV MOD BP:    48.2 ml                                   3D Volume EF:                                 3D EF:        60 %                                 LV EDV:       118 ml                                 LV ESV:       47 ml  LV SV:        71 ml   RIGHT VENTRICLE             IVC  RV S prime:     78.75 cm/s  IVC diam: 2.00 cm  TAPSE (M-mode): 1.8 cm   LEFT ATRIUM             Index        RIGHT ATRIUM           Index  LA diam:        3.80 cm 1.99 cm/m   RA Area:     11.70 cm  LA Vol (A2C):   54.6 ml 28.53 ml/m  RA Volume:   19.80 ml  10.35 ml/m  LA Vol (A4C):   34.1 ml 17.82 ml/m   LA Biplane Vol: 45.5 ml 23.78 ml/m   AORTIC VALVE                     PULMONIC VALVE  AV Area (Vmax):    1.15 cm      PR End Diast Vel: 1.48 msec  AV Area (Vmean):   1.04 cm  AV Area (VTI):     1.10 cm  AV Vmax:           367.80 cm/s  AV Vmean:          257.800 cm/s  AV VTI:            0.895 m  AV Peak Grad:      54.1 mmHg  AV Mean Grad:      33.0 mmHg  LVOT Vmax:         111.00 cm/s  LVOT Vmean:        70.600 cm/s  LVOT VTI:          0.258 m  LVOT/AV VTI ratio: 0.29  AI PHT:            665 msec    AORTA  Ao Root diam: 3.70 cm  Ao Asc diam:  4.00 cm   MITRAL VALVE               TRICUSPID VALVE  MV Area (PHT): 2.60 cm    TR Peak grad:   15.5 mmHg  MV Decel Time: 292 msec    TR Vmax:        197.00 cm/s  MV E velocity: 85.90 cm/s  MV A velocity: 85.90 cm/s  SHUNTS  MV E/A ratio:  1.00        Systemic VTI:  0.26 m                             Systemic Diam: 2.20 cm   CATH (04/2023) Conclusion      Mid LAD lesion is 95% stenosed.   Mid RCA lesion is 25% stenosed.   1. Normal filling pressures.  2. 95% focal proximal LAD stenosis after 2nd diagonal.     Recent Radiology Findings:   CTA (04/2023) FINDINGS: Aortic Valve: Tri leaflet calcified with restricted leaflet motion Calcium score 2196   Aorta: Bovine arch mild calcific atherosclerosis   Sino-tubular Junction: 30 mm   Ascending Thoracic Aorta: 38 mm   Aortic Arch: 33 mm   Descending Thoracic Aorta: 25 mm   Sinus of Valsalva Measurements:   Non-coronary: 35.7 mm  Height 22.7 mm   Right - coronary: 34.0 mm  Height 23.3 mm  Left -   coronary: 34.9 mm  Height 22 mm   Coronary Artery Height above Annulus:   Left Main: 15.4 mm above annulus   Right Coronary: 20.2 mm above annulus   Virtual Basal Annulus Measurements:   Maximum / Minimum Diameter: 27.2 mm x 22.9 mm Average diameter 24.9 mm   Perimeter:  80   Area: 488 mm2   Coronary Arteries: Sufficient height above annulus for deployment    Optimum Fluoroscopic Angle for Delivery: LAO 9 Caudal 18 degrees   Membranous septal length 5.8 mm   IMPRESSION: 1.  Calcified tri leaflet AV with score 2196   2. Annular Area of 488 mm2 suitable for a 26 mm Sapien 3 valve Alternatively can consider 29 mm Medtronic but with recent stenting to LAD access to coronary arteries in future may not be ideal   3.  Coronary arteries suitable height above annulus for deployment   4. Optimum angiographic angle for deployment LAO 9 Caudal 18 degrees   5.  Membranous septal length 5.8 mm      Recent Lab Findings: Lab Results  Component Value Date   WBC 8.2 05/03/2023   HGB 12.3 (L) 05/03/2023   HCT 37.0 (L) 05/03/2023   PLT 174 05/03/2023   GLUCOSE 79 05/03/2023   CHOL 130 08/23/2022   TRIG 73 08/23/2022   HDL 59 08/23/2022   LDLCALC 56 08/23/2022   ALT 19 04/17/2023   AST 17 04/17/2023   NA 136 05/03/2023   K 4.0 05/03/2023   CL 106 05/03/2023   CREATININE 0.83 05/03/2023   BUN 14 05/03/2023   CO2 21 (L) 05/03/2023   TSH 1.968 08/15/2021   INR 1.18 09/27/2015   HGBA1C 6.0 (H) 02/18/2015      Assessment / Plan:     87 yo with NYHA class 2 symptoms of moderate AS with normal LV function. Pt now sp PCI stent of LAD. Pt accepted into the PROGRESS trial. I have reviewed the risks and goals and recovery of TAVR with pt and wife and they wish to proceed. They had some concerning questions about the paperwork for trial in that they had a box checked that said was in another study already but he is not. Will discuss with team tomorrow. They otherwise are satisfied with information and wish to proceed. Pt is not a bailout candidate   I have spent 60 min in review of the records, viewing studies and in face to face with patient and in coordination of future care    Eugenio Hoes 06/02/2023 9:58 AM

## 2023-06-03 ENCOUNTER — Encounter: Payer: Self-pay | Admitting: Thoracic Surgery (Cardiothoracic Vascular Surgery)

## 2023-06-03 ENCOUNTER — Institutional Professional Consult (permissible substitution) (INDEPENDENT_AMBULATORY_CARE_PROVIDER_SITE_OTHER): Payer: Medicare Other | Admitting: Thoracic Surgery (Cardiothoracic Vascular Surgery)

## 2023-06-03 VITALS — BP 167/80 | HR 57 | Resp 18 | Ht 70.0 in | Wt 164.0 lb

## 2023-06-03 DIAGNOSIS — I350 Nonrheumatic aortic (valve) stenosis: Secondary | ICD-10-CM | POA: Diagnosis not present

## 2023-06-03 NOTE — Patient Instructions (Signed)
TAVR

## 2023-06-03 NOTE — Progress Notes (Signed)
Pre Surgical Assessment: 5 M Walk Test  17M=16.22ft  5 Meter Walk Test- trial 1: 7.20 seconds 5 Meter Walk Test- trial 2: 9.10 seconds 5 Meter Walk Test- trial 3: 6.05 seconds 5 Meter Walk Test Average: 7.45 seconds

## 2023-06-04 ENCOUNTER — Other Ambulatory Visit: Payer: Self-pay

## 2023-06-04 DIAGNOSIS — Z006 Encounter for examination for normal comparison and control in clinical research program: Secondary | ICD-10-CM

## 2023-06-04 DIAGNOSIS — I350 Nonrheumatic aortic (valve) stenosis: Secondary | ICD-10-CM

## 2023-06-05 ENCOUNTER — Other Ambulatory Visit: Payer: Self-pay | Admitting: Neurology

## 2023-06-06 NOTE — Telephone Encounter (Signed)
Pt called and left a message with the access nurse. He stated he was given a prescription for Neurontin 600mg  BID and 100mg  as needed in the mornings and afternoon. States this dose is helping with the neuropathy and the back symptoms. Denies any new or worsening s/s. States has been using since August 13th, 2024.  Full access nurse report is in Dr. Moises Blood box

## 2023-06-07 ENCOUNTER — Encounter (HOSPITAL_COMMUNITY)
Admission: RE | Admit: 2023-06-07 | Discharge: 2023-06-07 | Disposition: A | Discharge status: Home / Self Care | Payer: Medicare Other | Source: Ambulatory Visit | Attending: Internal Medicine

## 2023-06-07 ENCOUNTER — Other Ambulatory Visit: Payer: Self-pay

## 2023-06-07 ENCOUNTER — Ambulatory Visit (HOSPITAL_COMMUNITY)
Admission: RE | Admit: 2023-06-07 | Discharge: 2023-06-07 | Disposition: A | Discharge status: Home / Self Care | Payer: Medicare Other | Source: Ambulatory Visit | Attending: Internal Medicine

## 2023-06-07 DIAGNOSIS — Z01818 Encounter for other preprocedural examination: Secondary | ICD-10-CM | POA: Insufficient documentation

## 2023-06-07 DIAGNOSIS — Z006 Encounter for examination for normal comparison and control in clinical research program: Secondary | ICD-10-CM

## 2023-06-07 DIAGNOSIS — Z1152 Encounter for screening for COVID-19: Secondary | ICD-10-CM | POA: Diagnosis not present

## 2023-06-07 DIAGNOSIS — I350 Nonrheumatic aortic (valve) stenosis: Secondary | ICD-10-CM | POA: Insufficient documentation

## 2023-06-07 DIAGNOSIS — Z01812 Encounter for preprocedural laboratory examination: Secondary | ICD-10-CM | POA: Diagnosis not present

## 2023-06-07 LAB — COMPREHENSIVE METABOLIC PANEL
ALT: 19 U/L (ref 0–44)
AST: 18 U/L (ref 15–41)
Albumin: 3.8 g/dL (ref 3.5–5.0)
Alkaline Phosphatase: 47 U/L (ref 38–126)
Anion gap: 8 (ref 5–15)
BUN: 14 mg/dL (ref 8–23)
CO2: 25 mmol/L (ref 22–32)
Calcium: 9 mg/dL (ref 8.9–10.3)
Chloride: 104 mmol/L (ref 98–111)
Creatinine, Ser: 0.83 mg/dL (ref 0.61–1.24)
GFR, Estimated: >60 mL/min (ref >60)
Glucose, Bld: 97 mg/dL (ref 70–99)
Potassium: 3.9 mmol/L (ref 3.5–5.1)
Sodium: 137 mmol/L (ref 135–145)
Total Bilirubin: 1 mg/dL (ref 0.3–1.2)
Total Protein: 6.4 g/dL — ABNORMAL LOW (ref 6.5–8.1)

## 2023-06-07 LAB — URINALYSIS, ROUTINE W REFLEX MICROSCOPIC
Bilirubin Urine: NEGATIVE
Glucose, UA: NEGATIVE mg/dL
Hgb urine dipstick: NEGATIVE
Ketones, ur: NEGATIVE mg/dL
Leukocytes,Ua: NEGATIVE
Nitrite: NEGATIVE
Protein, ur: NEGATIVE mg/dL
Specific Gravity, Urine: 1.014 (ref 1.005–1.030)
pH: 6 (ref 5.0–8.0)

## 2023-06-07 LAB — TYPE AND SCREEN
ABO/RH(D): A NEG
Antibody Screen: NEGATIVE

## 2023-06-07 LAB — CBC
HCT: 40.3 % (ref 39.0–52.0)
Hemoglobin: 13.1 g/dL (ref 13.0–17.0)
MCH: 30 pg (ref 26.0–34.0)
MCHC: 32.5 g/dL (ref 30.0–36.0)
MCV: 92.4 fL (ref 80.0–100.0)
Platelets: 183 10*3/uL (ref 150–400)
RBC: 4.36 MIL/uL (ref 4.22–5.81)
RDW: 13.2 % (ref 11.5–15.5)
WBC: 8.6 10*3/uL (ref 4.0–10.5)
nRBC: 0 % (ref 0.0–0.2)

## 2023-06-07 LAB — PROTIME-INR
INR: 1.1 (ref 0.8–1.2)
Prothrombin Time: 14.2 seconds (ref 11.4–15.2)

## 2023-06-07 LAB — SURGICAL PCR SCREEN
MRSA, PCR: NEGATIVE
Staphylococcus aureus: NEGATIVE

## 2023-06-07 NOTE — Progress Notes (Signed)
Patient signed all consents at PAT lab appointment. CHG soap and instructions were given to patient. CHG surgical prep reviewed with patient and all questions answered.  Patients chart send to anesthesia for review.

## 2023-06-07 NOTE — Research (Signed)
   Progress CAP study   NIHSS and mRs performed.

## 2023-06-08 LAB — SARS CORONAVIRUS 2 (TAT 6-24 HRS): SARS Coronavirus 2: NEGATIVE

## 2023-06-10 MED ORDER — DEXMEDETOMIDINE HCL IN NACL 400 MCG/100ML IV SOLN
0.1000 ug/kg/h | INTRAVENOUS | Status: AC
  Administered 2023-06-11: .7 ug/kg/h via INTRAVENOUS
  Administered 2023-06-11: 74.4 ug via INTRAVENOUS
  Filled 2023-06-10: qty 100

## 2023-06-10 MED ORDER — MAGNESIUM SULFATE 50 % IJ SOLN
40.0000 meq | INTRAMUSCULAR | Status: DC
  Filled 2023-06-10: qty 9.85

## 2023-06-10 MED ORDER — NOREPINEPHRINE 4 MG/250ML-% IV SOLN
0.0000 ug/min | INTRAVENOUS | Status: AC
  Administered 2023-06-11: 2 ug/min via INTRAVENOUS
  Filled 2023-06-10: qty 250

## 2023-06-10 MED ORDER — CEFAZOLIN SODIUM-DEXTROSE 2-4 GM/100ML-% IV SOLN
2.0000 g | INTRAVENOUS | Status: AC
  Administered 2023-06-11: 2 g via INTRAVENOUS
  Filled 2023-06-10: qty 100

## 2023-06-10 MED ORDER — HEPARIN 30,000 UNITS/1000 ML (OHS) CELLSAVER SOLUTION
Status: DC
  Filled 2023-06-10: qty 1000

## 2023-06-10 MED ORDER — POTASSIUM CHLORIDE 2 MEQ/ML IV SOLN
80.0000 meq | INTRAVENOUS | Status: DC
  Filled 2023-06-10: qty 40

## 2023-06-10 NOTE — H&P (Signed)
[address]       [place] [address]             [phone]                                   Danny [name] [facility] Medical Record #[id number] Date of Birth: March 27, 1936   Laurey Morale, MD [name], MD   Chief Complaint:  increasing SOB    History of Present Illness:     Pt is an 87 yo male who has been followed by Dr Shirlee Latch for some time and has been suffering from progressive aortic stenosis. Pt has had a work up for cardiac amyloidosis in the past but was equivicol. Pt has been suffering from increasing DOE and has had issues with being able to do his activities like golfing without having to stop and rest. Pt underwent echo with an LV EF of 60% and with moderate AS with a  mean gradient of 33 mmHg and an AVA of 1.1cm2. Pt was felt to be symptomatic from this AS and underwent in work up a cath with a significant LAD lesion that was treated with stenting. Pt feels like that has not had any improvement in his DOE. He denies lightheadedness or CP. Pt underwent TAVR CTA and has acceptable anatomy for a 26 mm sapien 3 valve via femoral access. He has been accepted into the PROGRESS trial for moderate symptomatic AS pts.             Past Medical History:  Diagnosis Date   Allergic rhinitis     Arthritis     BPH (benign prostatic hypertrophy)      Dr. Laverle Patter   Cervical disc disease     Coronary artery disease      Patient developed exertional dyspnea. ETT-myoview (8/12) with 11 METS, normal LV EF, normal perfusion at rest and stress but elevated TID ratio. Coronary CT angiogram in 9/12 showed equivocal significant disease in the CFX. Cath in 10/12 showed EF 55-60%, 30% mid RCA, 40% distal RCA, 40% proximal LAD. ETT-Myoview (10/14): Diaphragmatic attenuation, no ischemia, EF 62%, normal study   Detached vitreous humor     Diverticulosis     GERD (gastroesophageal reflux disease)     Hemorrhoids     Impaired fasting glucose     Ingrown  toenail     Lumbar radiculopathy     Numbness and tingling in left arm     Onychomycosis     Rotator cuff disorder     Sixth nerve palsy 09/18/1999               Past Surgical History:  Procedure Laterality Date   abnormal stress test       APPENDECTOMY   09/18/1955   BACK SURGERY   [id number],1324    lumbar fusion-   CARDIAC CATHETERIZATION       COLONOSCOPY   2017   CORONARY STENT INTERVENTION [name] 05/02/2023    Procedure: CORONARY STENT INTERVENTION;  Surgeon: Orbie Pyo, MD;  Location: [facility];  Service: Cardiovascular;  Laterality: [name];   HEMORRHOID SURGERY   09/18/1963   HERNIA REPAIR   09/17/1994    RIH   LIPOMA EXCISION   09/05/2011    Procedure: EXCISION LIPOMA;  Surgeon: Currie Paris, MD;  Location: [facility];  Service:  General;  Laterality: Left;  removal of 4 cm lipoma of back   LUMBAR LAMINECTOMY/DECOMPRESSION MICRODISCECTOMY Right 10/05/2015    Procedure: Extraforaminal Microdiscectomy  - L4-L5 - L5-S1 - right;  Surgeon: Tia Alert, MD;  Location: [facility];  Service: Neurosurgery;  Laterality: Right;  Extraforaminal Microdiscectomy  - L4-L5 - L5-S1 - right   melonoma Right      removed from right arm   RIGHT HEART CATH AND CORONARY ANGIOGRAPHY [name] 05/02/2023    Procedure: RIGHT HEART CATH AND CORONARY ANGIOGRAPHY;  Surgeon: Laurey Morale, MD;  Location: [facility] INVASIVE CV LAB;  Service: Cardiovascular;  Laterality: [name];   RIGHT/LEFT HEART CATH AND CORONARY ANGIOGRAPHY [name] 05/02/2023    Procedure: RIGHT/LEFT HEART CATH AND CORONARY ANGIOGRAPHY;  Surgeon: Laurey Morale, MD;  Location: [facility] INVASIVE CV LAB;  Service: Cardiovascular;  Laterality: [name];   ROTATOR CUFF REPAIR   07/17/2007    left   SPINE SURGERY   07/06/10 and 09/07/09    spinal fusion   TONSILLECTOMY AND ADENOIDECTOMY   09/17/1945          Tobacco Use History  Social History       Tobacco Use  Smoking Status Never  Smokeless Tobacco Never      Social  History        Substance and Sexual Activity  Alcohol Use Yes    Comment: occasionally      Social History         Socioeconomic History   Marital status: Married      Spouse name: Not on file   Number of children: Not on file   Years of education: Not on file   Highest education level: Not on file  Occupational History   Occupation: retired      Associate Professor: RETIRED      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[place] [employer]  Tobacco Use   Smoking status: Never   Smokeless tobacco: Never  Vaping Use   Vaping status: Never Used  Substance and Sexual Activity   Alcohol use: Yes      Comment: occasionally   Drug use: No   Sexual activity: Not on file  Other Topics Concern   Not on file  Social History Narrative    Right handed    Lives with wife two story home    Social Determinants of Health        Financial Resource Strain: Not on file  Food Insecurity: No Food Insecurity (05/02/2023)    Hunger Vital Sign     Worried About Running Out of Food in the Last Year: Never true     Ran Out of Food in the Last Year: Never true  Transportation Needs: No Transportation Needs (05/02/2023)    PRAPARE - Therapist, art (Medical): No     Lack of Transportation (Non-Medical): No  Physical Activity: Not on file  Stress: Not on file  Social Connections: Not on file  Intimate Partner Violence: Not At Risk (05/02/2023)    Humiliation, Afraid, Rape, and Kick questionnaire     Fear of Current or Ex-Partner: No     Emotionally Abused: No     Physically Abused: No     Sexually Abused: No      Allergies  No Known Allergies           Current Outpatient Medications  Medication Sig Dispense Refill   aspirin EC 81 MG tablet Take 81 mg by mouth  in the morning.       betamethasone dipropionate 0.05 % cream Apply 0.5 application  topically 2 (two) times daily as needed (dry/irritated skin).       Calcium-Magnesium (CAL-MAG PO) Take 1 tablet by mouth in the morning.  With Potassium       CHOLECALCIFEROL PO Take 2-3 capsules by mouth in the morning.       clopidogrel (PLAVIX) 75 MG tablet Take 1 tablet (75 mg total) by mouth daily with breakfast. 90 tablet 3   Coenzyme Q10 (CO Q 10 PO) Take 400 mg by mouth in the morning.       finasteride (PROSCAR) 5 MG tablet Take 5 mg by mouth in the morning.       furosemide (LASIX) 20 MG tablet Take 1 tablet (20 mg total) by mouth as needed (For weight gain of 3 lbs in 24 hours or 5 lbs in a week). For weight gain of 3 lbs in 24 hours or 5 lbs in a week 30 tablet 0   gabapentin (NEURONTIN) 600 MG tablet TAKE 1 TABLET AT 4PM AND TAKE 1 TABLET AT BEDTIME. 180 tablet 3   Magnesium Oxide 400 MG CAPS Take 1 capsule (400 mg total) by mouth daily. 30 capsule 11   metoprolol succinate (TOPROL XL) 25 MG 24 hr tablet Take 1 tablet (25 mg total) by mouth daily. (Patient taking differently: Take 25 mg by mouth daily as needed (chest tightness/afib).) 30 tablet 3   Multiple Vitamin (MULTIVITAMIN WITH MINERALS) TABS tablet Take 1 tablet by mouth daily. Centrum Silver       Multiple Vitamins-Minerals (PRESERVISION AREDS 2) CAPS Take 1 capsule by mouth in the morning and at bedtime.       nitroGLYCERIN (NITROSTAT) 0.4 MG SL tablet ONE TABLET UNDER TONGUE WHEN NEEDED FOR CHEST PAIN. MAY REPEAT IN 5 MINUTES. 25 tablet 6   pantoprazole (PROTONIX) 40 MG tablet Take 1 tablet (40 mg total) by mouth daily. 90 tablet 3   Polyvinyl Alcohol-Povidone PF (REFRESH) 1.4-0.6 % SOLN Place 1-2 drops into both eyes 3 (three) times daily as needed (dry/irritated eyes.).       Probiotic Product (PROBIOTIC-10 PO) Take 1 capsule by mouth in the morning.       RESTASIS 0.05 % ophthalmic emulsion Place 1 drop into both eyes 2 (two) times daily as needed (dry/irritated eyes.).       rosuvastatin (CRESTOR) 5 MG tablet TAKE 1 TABLET ONCE DAILY. 90 tablet 0   Saw Palmetto 160 MG CAPS Take 160 mg by mouth 2 (two) times daily.       tamsulosin (FLOMAX) 0.4 MG CAPS  capsule Take 0.4 mg by mouth in the morning and at bedtime.       traMADol (ULTRAM) 50 MG tablet Take 50 mg by mouth 3 (three) times daily as needed (back pain.).       traZODone (DESYREL) 50 MG tablet Take 25 mg by mouth at bedtime as needed for sleep.       Turmeric (QC TUMERIC COMPLEX) 500 MG CAPS Take 500 mg by mouth in the morning.       zolpidem (AMBIEN) 10 MG tablet Take 10 mg by mouth at bedtime as needed for sleep.          No current facility-administered medications for this visit.               Family History  Problem Relation Age of Onset   Cancer Mother  multiple myeloma   Hypertension Mother     Stroke Father     Heart attack Neg Hx                  Physical Exam: Alert Teeth ok Lungs: Clear  Card: rr with 3/6 sem Ext: no edema         Diagnostic Studies & Laboratory data: I have personally reviewed the following studies and agree with the findings   TTE (03/2023) IMPRESSIONS     1. Left ventricular ejection fraction, by estimation, is 60 to 65%. Left  ventricular ejection fraction by 3D volume is 60 %. The left ventricle has  normal function. The left ventricle has no regional wall motion  abnormalities. Left ventricular diastolic   parameters were normal. The average left ventricular global longitudinal  strain is -20.6 %. The global longitudinal strain is normal.   2. Right ventricular systolic function is normal. The right ventricular  size is normal. There is normal pulmonary artery systolic pressure. The  estimated right ventricular systolic pressure is 18.5 mmHg.   3. The mitral valve is grossly normal. Trivial mitral valve  regurgitation. No evidence of mitral stenosis.   4. The aortic valve is tricuspid. There is severe calcifcation of the  aortic valve. Aortic valve regurgitation is mild. Moderate aortic valve  stenosis. Aortic valve area, by VTI measures 1.10 cm. Aortic valve mean  gradient measures 33.0 mmHg. Aortic  valve  Vmax measures 3.68 m/s.   5. Aortic dilatation noted. There is borderline dilatation of the  ascending aorta, measuring 40 mm.   6. The inferior vena cava is normal in size with greater than 50%  respiratory variability, suggesting right atrial pressure of 3 mmHg.   FINDINGS   Left Ventricle: Left ventricular ejection fraction, by estimation, is 60  to 65%. Left ventricular ejection fraction by 3D volume is 60 %. The left  ventricle has normal function. The left ventricle has no regional wall  motion abnormalities. The average  left ventricular global longitudinal strain is -20.6 %. The global  longitudinal strain is normal. The global longitudinal strain is normal  despite suboptimal segment tracking. The left ventricular internal cavity  size was normal in size. There is  borderline left ventricular hypertrophy. Left ventricular diastolic  parameters were normal.   Right Ventricle: The right ventricular size is normal. No increase in  right ventricular wall thickness. Right ventricular systolic function is  normal. There is normal pulmonary artery systolic pressure. The tricuspid  regurgitant velocity is 1.97 m/s, and   with an assumed right atrial pressure of 3 mmHg, the estimated right  ventricular systolic pressure is 18.5 mmHg.   Left Atrium: Left atrial size was normal in size.   Right Atrium: Right atrial size was normal in size.   Pericardium: There is no evidence of pericardial effusion.   Mitral Valve: The mitral valve is grossly normal. Trivial mitral valve  regurgitation. No evidence of mitral valve stenosis.   Tricuspid Valve: The tricuspid valve is normal in structure. Tricuspid  valve regurgitation is trivial.   Aortic Valve: The aortic valve is tricuspid. There is severe calcifcation  of the aortic valve. Aortic valve regurgitation is mild. Aortic  regurgitation PHT measures 665 msec. Moderate aortic stenosis is present.  Aortic valve mean gradient measures  33.0  mmHg. Aortic valve peak gradient measures 54.1 mmHg. Aortic valve area, by  VTI measures 1.10 cm.   Pulmonic Valve: The pulmonic valve was normal in structure.  Pulmonic valve  regurgitation is trivial.   Aorta: Aortic dilatation noted. There is borderline dilatation of the  ascending aorta, measuring 40 mm.   Venous: The inferior vena cava is normal in size with greater than 50%  respiratory variability, suggesting right atrial pressure of 3 mmHg.   IAS/Shunts: The interatrial septum was not well visualized.     LEFT VENTRICLE  PLAX 2D  LVIDd:         4.70 cm         Diastology  LVIDs:         3.40 cm         LV e' medial:    7.29 cm/s  LV PW:         1.00 cm         LV E/e' medial:  11.8  LV IVS:        1.00 cm         LV e' lateral:   8.49 cm/s  LVOT diam:     2.20 cm         LV E/e' lateral: 10.1  LV SV:         98  LV SV Index:   51              2D  LVOT Area:     3.80 cm        Longitudinal                                 Strain                                 2D Strain GLS  -20.6 %  LV Volumes (MOD)               Avg:  LV vol d, MOD    80.1 ml  A2C:                           3D Volume EF  LV vol d, MOD    86.1 ml       LV 3D EF:    Left  A4C:                                        ventricul  LV vol s, MOD    40.0 ml                    ar  A2C:                                        ejection  LV vol s, MOD    34.7 ml                    fraction  A4C:                                        by 3D  LV SV MOD A2C:   40.1 ml  volume is  LV SV MOD A4C:   86.1 ml                    60 %.  LV SV MOD BP:    48.2 ml                                   3D Volume EF:                                 3D EF:        60 %                                 LV EDV:       118 ml                                 LV ESV:       47 ml                                 LV SV:        71 ml   RIGHT VENTRICLE             IVC  RV S prime:     78.75 cm/s  IVC diam: 2.00 cm   TAPSE (M-mode): 1.8 cm   LEFT ATRIUM             Index        RIGHT ATRIUM           Index  LA diam:        3.80 cm 1.99 cm/m   RA Area:     11.70 cm  LA Vol (A2C):   54.6 ml 28.53 ml/m  RA Volume:   19.80 ml  10.35 ml/m  LA Vol (A4C):   34.1 ml 17.82 ml/m  LA Biplane Vol: 45.5 ml 23.78 ml/m   AORTIC VALVE                     PULMONIC VALVE  AV Area (Vmax):    1.15 cm      PR End Diast Vel: 1.48 msec  AV Area (Vmean):   1.04 cm  AV Area (VTI):     1.10 cm  AV Vmax:           367.80 cm/s  AV Vmean:          257.800 cm/s  AV VTI:            0.895 m  AV Peak Grad:      54.1 mmHg  AV Mean Grad:      33.0 mmHg  LVOT Vmax:         111.00 cm/s  LVOT Vmean:        70.600 cm/s  LVOT VTI:          0.258 m  LVOT/AV VTI ratio: 0.29  AI PHT:            665 msec    AORTA  Ao Root diam: 3.70 cm  Ao Asc diam:  4.00 cm   MITRAL VALVE  TRICUSPID VALVE  MV Area (PHT): 2.60 cm    TR Peak grad:   15.5 mmHg  MV Decel Time: 292 msec    TR Vmax:        197.00 cm/s  MV E velocity: 85.90 cm/s  MV A velocity: 85.90 cm/s  SHUNTS  MV E/A ratio:  1.00        Systemic VTI:  0.26 m                             Systemic Diam: 2.20 cm    CATH (04/2023) Conclusion       Mid LAD lesion is 95% stenosed.   Mid RCA lesion is 25% stenosed.   1. Normal filling pressures.  2. 95% focal proximal LAD stenosis after 2nd diagonal.     Recent Radiology Findings:   CTA (04/2023) FINDINGS: Aortic Valve: Tri leaflet calcified with restricted leaflet motion Calcium score 2196   Aorta: Bovine arch mild calcific atherosclerosis   Sino-tubular Junction: 30 mm   Ascending Thoracic Aorta: 38 mm   Aortic Arch: 33 mm   Descending Thoracic Aorta: 25 mm   Sinus of Valsalva Measurements:   Non-coronary: 35.7 mm  Height 22.7 mm   Right - coronary: 34.0 mm  Height 23.3 mm   Left -   coronary: 34.9 mm  Height 22 mm   Coronary Artery Height above Annulus:   Left Main: 15.4 mm above  annulus   Right Coronary: 20.2 mm above annulus   Virtual Basal Annulus Measurements:   Maximum / Minimum Diameter: 27.2 mm x 22.9 mm Average diameter 24.9 mm   Perimeter:  80   Area: 488 mm2   Coronary Arteries: Sufficient height above annulus for deployment   Optimum Fluoroscopic Angle for Delivery: LAO 9 Caudal 18 degrees   Membranous septal length 5.8 mm   IMPRESSION: 1.  Calcified tri leaflet AV with score 2196   2. Annular Area of 488 mm2 suitable for a 26 mm Sapien 3 valve Alternatively can consider 29 mm Medtronic but with recent stenting to LAD access to coronary arteries in future may not be ideal   3.  Coronary arteries suitable height above annulus for deployment   4. Optimum angiographic angle for deployment LAO 9 Caudal 18 degrees   5.  Membranous septal length 5.8 mm       Recent Lab Findings: Recent Labs       Lab Results  Component Value Date    WBC 8.2 05/03/2023    HGB 12.3 (L) 05/03/2023    HCT 37.0 (L) 05/03/2023    PLT 174 05/03/2023    GLUCOSE 79 05/03/2023    CHOL 130 08/23/2022    TRIG 73 08/23/2022    HDL 59 08/23/2022    LDLCALC 56 08/23/2022    ALT 19 04/17/2023    AST 17 04/17/2023    NA 136 05/03/2023    K 4.0 05/03/2023    CL 106 05/03/2023    CREATININE 0.83 05/03/2023    BUN 14 05/03/2023    CO2 21 (L) 05/03/2023    TSH 1.968 08/15/2021    INR 1.18 09/27/2015    HGBA1C 6.0 (H) 02/18/2015            Assessment / Plan:     87 yo with NYHA class 2 symptoms of moderate AS with normal LV function. Pt now sp PCI stent of LAD. Pt accepted into the PROGRESS  trial. I have reviewed the risks and goals and recovery of TAVR with pt and wife and they wish to proceed. They had some concerning questions about the paperwork for trial in that they had a box checked that said was in another study already but he is not. Will discuss with team tomorrow. They otherwise are satisfied with information and wish to proceed. Pt is not a bailout  candidate

## 2023-06-11 ENCOUNTER — Inpatient Hospital Stay (HOSPITAL_COMMUNITY): Payer: Medicare Other

## 2023-06-11 ENCOUNTER — Inpatient Hospital Stay (HOSPITAL_COMMUNITY)
Admission: RE | Admit: 2023-06-11 | Discharge: 2023-06-12 | DRG: 267 | Disposition: A | Discharge status: Home / Self Care | Payer: Medicare Other | Attending: Internal Medicine | Admitting: Internal Medicine

## 2023-06-11 ENCOUNTER — Encounter (HOSPITAL_COMMUNITY)
Admission: RE | Disposition: A | Discharge status: Home / Self Care | Payer: Self-pay | Source: Home / Self Care | Attending: Internal Medicine

## 2023-06-11 ENCOUNTER — Inpatient Hospital Stay (HOSPITAL_COMMUNITY): Payer: Self-pay | Admitting: Physician Assistant

## 2023-06-11 ENCOUNTER — Encounter (HOSPITAL_COMMUNITY): Payer: Self-pay | Admitting: Internal Medicine

## 2023-06-11 ENCOUNTER — Other Ambulatory Visit: Payer: Self-pay | Admitting: Cardiology

## 2023-06-11 ENCOUNTER — Other Ambulatory Visit: Payer: Self-pay

## 2023-06-11 DIAGNOSIS — N400 Enlarged prostate without lower urinary tract symptoms: Secondary | ICD-10-CM | POA: Diagnosis not present

## 2023-06-11 DIAGNOSIS — G629 Polyneuropathy, unspecified: Secondary | ICD-10-CM | POA: Diagnosis not present

## 2023-06-11 DIAGNOSIS — I350 Nonrheumatic aortic (valve) stenosis: Secondary | ICD-10-CM | POA: Diagnosis not present

## 2023-06-11 DIAGNOSIS — E785 Hyperlipidemia, unspecified: Secondary | ICD-10-CM | POA: Diagnosis present

## 2023-06-11 DIAGNOSIS — I440 Atrioventricular block, first degree: Secondary | ICD-10-CM | POA: Insufficient documentation

## 2023-06-11 DIAGNOSIS — Z981 Arthrodesis status: Secondary | ICD-10-CM

## 2023-06-11 DIAGNOSIS — I2510 Atherosclerotic heart disease of native coronary artery without angina pectoris: Secondary | ICD-10-CM | POA: Diagnosis not present

## 2023-06-11 DIAGNOSIS — K219 Gastro-esophageal reflux disease without esophagitis: Secondary | ICD-10-CM | POA: Diagnosis not present

## 2023-06-11 DIAGNOSIS — Z79899 Other long term (current) drug therapy: Secondary | ICD-10-CM | POA: Diagnosis not present

## 2023-06-11 DIAGNOSIS — Z7982 Long term (current) use of aspirin: Secondary | ICD-10-CM | POA: Diagnosis not present

## 2023-06-11 DIAGNOSIS — Z006 Encounter for examination for normal comparison and control in clinical research program: Secondary | ICD-10-CM | POA: Diagnosis not present

## 2023-06-11 DIAGNOSIS — Z7902 Long term (current) use of antithrombotics/antiplatelets: Secondary | ICD-10-CM | POA: Diagnosis not present

## 2023-06-11 DIAGNOSIS — Z952 Presence of prosthetic heart valve: Secondary | ICD-10-CM | POA: Diagnosis not present

## 2023-06-11 DIAGNOSIS — J309 Allergic rhinitis, unspecified: Secondary | ICD-10-CM | POA: Diagnosis not present

## 2023-06-11 DIAGNOSIS — I358 Other nonrheumatic aortic valve disorders: Secondary | ICD-10-CM | POA: Diagnosis present

## 2023-06-11 DIAGNOSIS — Z955 Presence of coronary angioplasty implant and graft: Secondary | ICD-10-CM | POA: Diagnosis not present

## 2023-06-11 DIAGNOSIS — Z85820 Personal history of malignant melanoma of skin: Secondary | ICD-10-CM

## 2023-06-11 HISTORY — PX: INTRAOPERATIVE TRANSTHORACIC ECHOCARDIOGRAM: SHX6523

## 2023-06-11 HISTORY — PX: TRANSCATHETER AORTIC VALVE REPLACEMENT, TRANSFEMORAL: SHX6400

## 2023-06-11 HISTORY — DX: Presence of prosthetic heart valve: Z95.2

## 2023-06-11 LAB — ECHOCARDIOGRAM LIMITED
AR max vel: 1.25 cm2
AV Area VTI: 1.2 cm2
AV Area mean vel: 1.12 cm2
AV Mean grad: 17 mmHg
AV Peak grad: 24.9 mmHg
Ao pk vel: 2.49 m/s

## 2023-06-11 LAB — POCT ACTIVATED CLOTTING TIME: Activated Clotting Time: 287 seconds

## 2023-06-11 SURGERY — IMPLANTATION, AORTIC VALVE, TRANSCATHETER, FEMORAL APPROACH
Anesthesia: Monitor Anesthesia Care | Laterality: Right

## 2023-06-11 MED ORDER — SODIUM CHLORIDE 0.9 % IV SOLN: 250.0000 mL | INTRAVENOUS | Status: DC | PRN

## 2023-06-11 MED ORDER — TRAZODONE 25 MG HALF TABLET: 25.0000 mg | ORAL_TABLET | Freq: Every evening | ORAL | Status: DC | PRN

## 2023-06-11 MED ORDER — TRAMADOL HCL 50 MG PO TABS
50.0000 mg | ORAL_TABLET | ORAL | Status: DC | PRN
  Administered 2023-06-11: 100 mg via ORAL
  Filled 2023-06-11: qty 2

## 2023-06-11 MED ORDER — SODIUM CHLORIDE 0.9 % IV SOLN: INTRAVENOUS | Status: DC

## 2023-06-11 MED ORDER — TRAMADOL HCL 50 MG PO TABS: 50.0000 mg | ORAL_TABLET | Freq: Three times a day (TID) | ORAL | Status: DC | PRN

## 2023-06-11 MED ORDER — HEPARIN SODIUM (PORCINE) 1000 UNIT/ML IJ SOLN
INTRAMUSCULAR | Status: DC | PRN
  Administered 2023-06-11: 11000 [IU] via INTRAVENOUS

## 2023-06-11 MED ORDER — ONDANSETRON HCL 4 MG/2ML IJ SOLN: 4.0000 mg | Freq: Four times a day (QID) | INTRAMUSCULAR | Status: DC | PRN

## 2023-06-11 MED ORDER — CHLORHEXIDINE GLUCONATE 4 % EX SOLN: 30.0000 mL | CUTANEOUS | Status: DC

## 2023-06-11 MED ORDER — TAMSULOSIN HCL 0.4 MG PO CAPS
0.4000 mg | ORAL_CAPSULE | Freq: Every day | ORAL | Status: DC
  Administered 2023-06-11 – 2023-06-12 (×2): 0.4 mg via ORAL
  Filled 2023-06-11 (×2): qty 1

## 2023-06-11 MED ORDER — SODIUM CHLORIDE 0.9% FLUSH: 3.0000 mL | INTRAVENOUS | Status: DC | PRN

## 2023-06-11 MED ORDER — CEFAZOLIN SODIUM-DEXTROSE 2-4 GM/100ML-% IV SOLN
2.0000 g | Freq: Three times a day (TID) | INTRAVENOUS | Status: AC
  Administered 2023-06-11 – 2023-06-12 (×2): 2 g via INTRAVENOUS
  Filled 2023-06-11 (×2): qty 100

## 2023-06-11 MED ORDER — FINASTERIDE 5 MG PO TABS
5.0000 mg | ORAL_TABLET | Freq: Every morning | ORAL | Status: DC
  Administered 2023-06-12: 5 mg via ORAL
  Filled 2023-06-11: qty 1

## 2023-06-11 MED ORDER — CHLORHEXIDINE GLUCONATE 0.12 % MT SOLN: 15.0000 mL | Freq: Once | OROMUCOSAL | Status: AC

## 2023-06-11 MED ORDER — CHLORHEXIDINE GLUCONATE 0.12 % MT SOLN
OROMUCOSAL | Status: AC
  Administered 2023-06-11: 15 mL via OROMUCOSAL
  Filled 2023-06-11: qty 15

## 2023-06-11 MED ORDER — IOHEXOL 350 MG/ML SOLN
INTRAVENOUS | Status: DC | PRN
  Administered 2023-06-11: 70 mL via INTRA_ARTERIAL

## 2023-06-11 MED ORDER — OXYCODONE HCL 5 MG PO TABS: 5.0000 mg | ORAL_TABLET | ORAL | Status: DC | PRN

## 2023-06-11 MED ORDER — GABAPENTIN 300 MG PO CAPS
600.0000 mg | ORAL_CAPSULE | Freq: Two times a day (BID) | ORAL | Status: DC
  Administered 2023-06-11 – 2023-06-12 (×3): 600 mg via ORAL
  Filled 2023-06-11 (×3): qty 2

## 2023-06-11 MED ORDER — ASPIRIN 81 MG PO TBEC
81.0000 mg | DELAYED_RELEASE_TABLET | Freq: Every morning | ORAL | Status: DC
  Administered 2023-06-12: 81 mg via ORAL
  Filled 2023-06-11: qty 1

## 2023-06-11 MED ORDER — PANTOPRAZOLE SODIUM 40 MG PO TBEC
40.0000 mg | DELAYED_RELEASE_TABLET | Freq: Every day | ORAL | Status: DC
  Administered 2023-06-11 – 2023-06-12 (×2): 40 mg via ORAL
  Filled 2023-06-11 (×2): qty 1

## 2023-06-11 MED ORDER — PROTAMINE SULFATE 10 MG/ML IV SOLN
INTRAVENOUS | Status: DC | PRN
Start: 2023-06-11 — End: 2023-06-11
  Administered 2023-06-11 (×11): 10 mg via INTRAVENOUS

## 2023-06-11 MED ORDER — LACTATED RINGERS IV SOLN: INTRAVENOUS | Status: DC

## 2023-06-11 MED ORDER — CHLORHEXIDINE GLUCONATE 4 % EX SOLN: 60.0000 mL | Freq: Once | CUTANEOUS | Status: DC

## 2023-06-11 MED ORDER — MORPHINE SULFATE (PF) 2 MG/ML IV SOLN: 1.0000 mg | INTRAVENOUS | Status: DC | PRN

## 2023-06-11 MED ORDER — HEPARIN (PORCINE) IN NACL 1000-0.9 UT/500ML-% IV SOLN
INTRAVENOUS | Status: DC | PRN
  Administered 2023-06-11 (×2): 500 mL

## 2023-06-11 MED ORDER — ZOLPIDEM TARTRATE 5 MG PO TABS
10.0000 mg | ORAL_TABLET | Freq: Every day | ORAL | Status: DC
  Administered 2023-06-11: 10 mg via ORAL
  Filled 2023-06-11: qty 2

## 2023-06-11 MED ORDER — CLOPIDOGREL BISULFATE 75 MG PO TABS
75.0000 mg | ORAL_TABLET | Freq: Every day | ORAL | Status: DC
  Administered 2023-06-12: 75 mg via ORAL
  Filled 2023-06-11: qty 1

## 2023-06-11 MED ORDER — ROSUVASTATIN CALCIUM 5 MG PO TABS
5.0000 mg | ORAL_TABLET | Freq: Every day | ORAL | Status: DC
  Administered 2023-06-11 – 2023-06-12 (×2): 5 mg via ORAL
  Filled 2023-06-11 (×2): qty 1

## 2023-06-11 MED ORDER — SODIUM CHLORIDE 0.9 % IV SOLN: INTRAVENOUS | Status: AC

## 2023-06-11 MED ORDER — NITROGLYCERIN IN D5W 200-5 MCG/ML-% IV SOLN: 0.0000 ug/min | INTRAVENOUS | Status: DC

## 2023-06-11 MED ORDER — SODIUM CHLORIDE 0.9% FLUSH
3.0000 mL | Freq: Two times a day (BID) | INTRAVENOUS | Status: DC
  Administered 2023-06-11 – 2023-06-12 (×2): 3 mL via INTRAVENOUS

## 2023-06-11 MED ORDER — ACETAMINOPHEN 325 MG PO TABS
650.0000 mg | ORAL_TABLET | Freq: Four times a day (QID) | ORAL | Status: DC | PRN
  Administered 2023-06-12: 650 mg via ORAL
  Filled 2023-06-11: qty 2

## 2023-06-11 MED ORDER — LIDOCAINE HCL (PF) 1 % IJ SOLN
INTRAMUSCULAR | Status: DC | PRN
  Administered 2023-06-11 (×2): 10 mL

## 2023-06-11 MED ORDER — ACETAMINOPHEN 650 MG RE SUPP: 650.0000 mg | Freq: Four times a day (QID) | RECTAL | Status: DC | PRN

## 2023-06-11 SURGICAL SUPPLY — 30 items
BAG SNAP BAND KOVER 36X36 (MISCELLANEOUS) ×4 IMPLANT
CABLE SURGICAL S-101-97-12 (CABLE) IMPLANT
CATH 26 ULTRA DELIVERY (CATHETERS) IMPLANT
CATH DIAG 6FR PIGTAIL ANGLED (CATHETERS) IMPLANT
CATH INFINITI 5 FR STR PIGTAIL (CATHETERS) IMPLANT
CATH INFINITI 5FR ANG PIGTAIL (CATHETERS) IMPLANT
CATH INFINITI 6F AL2 (CATHETERS) IMPLANT
CLOSURE MYNX CONTROL 6F/7F (Vascular Products) IMPLANT
CLOSURE PERCLOSE PROSTYLE (VASCULAR PRODUCTS) IMPLANT
CRIMPER (MISCELLANEOUS) IMPLANT
DEVICE INFLATION ATRION QL2530 (MISCELLANEOUS) IMPLANT
KIT HEART LEFT (KITS) ×2 IMPLANT
PACK CARDIAC CATHETERIZATION (CUSTOM PROCEDURE TRAY) ×2 IMPLANT
SET ATX-X65L (MISCELLANEOUS) IMPLANT
SHEATH INTRODUCER SET 20-26 (SHEATH) IMPLANT
SHEATH PINNACLE 6F 10CM (SHEATH) IMPLANT
SHEATH PINNACLE 8F 10CM (SHEATH) IMPLANT
SHEATH PROBE COVER 6X72 (BAG) IMPLANT
STOPCOCK MORSE 400PSI 3WAY (MISCELLANEOUS) ×4 IMPLANT
TRANSDUCER W/STOPCOCK (MISCELLANEOUS) ×4 IMPLANT
TUBING ART PRESS 72 MALE/FEM (TUBING) IMPLANT
TUBING CIL FLEX 10 FLL-RA (TUBING) IMPLANT
VALV SAP RESI3 ULT 26 RESEARCH (Valve) ×2 IMPLANT
VALVE SAP RESI3 ULT 26 RESERCH (Valve) IMPLANT
WIRE AMPLATZ SS-J .035X180CM (WIRE) IMPLANT
WIRE EMERALD 3MM-J .035X150CM (WIRE) IMPLANT
WIRE EMERALD 3MM-J .035X260CM (WIRE) IMPLANT
WIRE EMERALD ST .035X260CM (WIRE) IMPLANT
WIRE MICRO SET SILHO 5FR 7 (SHEATH) IMPLANT
WIRE SAFARI SM CURVE 275 (WIRE) IMPLANT

## 2023-06-11 NOTE — Discharge Summary (Addendum)
[facility]   MULTIDISCIPLINARY HEART VALVE TEAM  Discharge Summary    Patient ID: Danny [name] MRN: [id number]; DOB: [birth year]  Admit date: 06/11/2023 Discharge date: 06/12/2023  Primary Care Provider: [name], MD  Primary Cardiologist: Marca Ancona, MD / Dr. Lynnette Caffey, MD & Dr. Leafy Ro, MD (TAVR)   Discharge Diagnoses    Principal Problem:   S/P TAVR (transcatheter aortic valve replacement) Active Problems:   CAD (coronary artery disease)   Hyperlipidemia   Moderate aortic stenosis   First degree AV block  Allergies No Known Allergies  Diagnostic Studies/Procedures    [facility]  TAVR OPERATIVE NOTE     Date of Procedure:                06/11/2023   Preoperative Diagnosis:      Moderate Aortic Stenosis    Postoperative Diagnosis:    Same    Procedure:        Transcatheter Aortic Valve Replacement - Transfemoral Approach             Edwards Sapien 3 Resilia THV (size 26 mm, model # 9755RLS, serial # [id number])              Co-Surgeons:                         Eugenio Hoes, MD and Alverda Skeans, MD Anesthesiologist:                  Anice Paganini, MD   Echocardiographer:              Riley Lam, MD   Pre-operative Echo Findings: Moderate aortic stenosis Normal left ventricular systolic function   Post-operative Echo Findings: No paravalvular leak Normal left ventricular systolic function   Left Heart Catheterization Findings: Left ventricular end-diastolic pressure of  _____________  Echo 06/11/23:    1. Left ventricular ejection fraction, by estimation, is 60 to 65%. The  left ventricle has normal function. The left ventricle has no regional  wall motion abnormalities. Left ventricular diastolic parameters are  indeterminate.   2. Right ventricular systolic function is normal. The right ventricular  size is normal. Tricuspid regurgitation signal is inadequate for assessing  PA pressure.    3. The mitral valve is normal in structure. Trivial mitral valve  regurgitation. No evidence of mitral stenosis.   4. The aortic valve has been repaired/replaced. No aortic stenosis is  present. There is a 26 mm Edwards Ultra, stented (TAVR) valve present in  the aortic position. Procedure Date: 06/11/23. Aortic valve area, by VTI  measures 1.88 cm. Aortic valve mean  gradient measures 9.0 mmHg. Aortic valve Vmax measures 1.91 m/s. DVI 0.66.  Trivial perivalvular AI at 1pm in the PSAX view.   5. Aortic dilatation noted. There is mild dilatation of the ascending  aorta, measuring 38 mm.   6. The inferior vena cava is normal in size with greater than 50%  respiratory variability, suggesting right atrial pressure of 3 mmHg. 7.  S/P 26mm Edwards Ultra stented TAVR with normal function and trivial  perivalvular AI in the parasternal short axis  view at 1pm   History of Present Illness     Danny [name] is a 87 y.o. male with a history of HLD, 1st deg AV block, peripheral neuropathy, CAD s/p recent PCI to pLAD, and moderate AS who presented to [facility] on 06/11/23 for planned TAVR through the  PROGRESS CAP trial.   Danny [name] has been followed by Dr. Shirlee Latch for his cardiology care. He underwent cardiac amyloid evaluation in 2021 which was found to be equivocal for transthyretin amyloid and thought to be negative. Serial echocardiograms have shown a preserved ejection fraction however the development of worsening aortic stenosis and associated severe calcification of the aortic valve. Most recent echocardiogram demonstrated moderate aortic stenosis with an aortic valve area of 1.1 cm2 and mean gradient of 33 mmHg with peak velocity of 3.68 m/s with ejection fraction of 60%. He was referred to the structural heart team and was felt to have symptomatic moderate aortic stenosis and was considered for PROGRESS CAP trial and ultimately met all inclusion criteria to proceed. [facility] 05/02/23 showed normal filling  pressures, 95% focal proximal LAD stenosis after 2nd diagonal s/p PCI/DES to mLAD. Pre TAVR CT imaging with anatomy suitable for 26mm S3UR valve placement.   He was then evaluated by the multidisciplinary valve team and felt to have symptomatic moderate aortic stenosis and to be suitable for PROGRESS TRIAL CAP TAVR, which was set up for 06/11/23.     Hospital Course   Moderate AS: s/p successful TAVR with a 26 mm Edwards Sapien 3 THV via the TF approach on 06/11/23 through the PROGRESS CAP Trial. Post operative echo completed with stable valve function. Groin sites remain stable. ECG with NSR and no high grade heart block. Plan to continue DAPT with ASA 81mg  and Plavix 75mg  daily given recent PCI. Likely to continue for one year then lifelong ASA. Patient ambulated with CRI with no issues. He will require lifelong dental SBE with Amoxicillin. This was discussed and will be RX'd at [facility] follow up next week.   1st AV block: Improved today with a PR interval at with improved HR into the 60-70's. Plan to restart home Toprol today.   CAD: Pre-TAVR cath showed a high-grade mid LAD lesion treated with OCT guided PCI with cutting balloon angioplasty and drug-eluting stent x 1 on 05/02/23. Continue DAPT with ASA 81mg  and Plavix 75mg  daily. Denies anginal symptoms. Continue Crestor 5mg  daily. Toprol 25mg  daily.   Consultants: None   The patient has been seen and examined by Dr. Lynnette Caffey who feels that he is stable and ready for discharge today, 06/12/23.  _____________  Discharge Vitals Blood pressure 132/88, pulse 78, temperature 97.7 F (36.5 C), temperature source Oral, resp. rate 17, height 5\' 10"  (1.778 m), weight 72.5 kg, SpO2 94%.  Filed Weights   06/11/23 0703 06/12/23 0500  Weight: 73.5 kg 72.5 kg   General: Well developed, well nourished, NAD Lungs:Clear to ausculation bilaterally. No wheezes, rales, or rhonchi. Breathing is unlabored. Cardiovascular: RRR with S1 S2. No murmurs Abdomen:  Soft, non-tender, non-distended. No obvious abdominal masses. Extremities: No edema.  Neuro: Alert and oriented. No focal deficits. No facial asymmetry. MAE spontaneously. Psych: Responds to questions appropriately with normal affect.    Labs & Radiologic Studies    CBC Recent Labs    06/12/23 0630  WBC 9.8  HGB 12.4*  HCT 36.7*  MCV 90.6  PLT 138*   Basic Metabolic Panel Recent Labs    16/10/96 0630  NA 135  K 3.8  CL 104  CO2 21*  GLUCOSE 115*  BUN 11  CREATININE 0.96  CALCIUM 9.0  MG 1.7   Liver Function Tests No results for input(s): "AST", "ALT", "ALKPHOS", "BILITOT", "PROT", "ALBUMIN" in the last 72 hours. No results for input(s): "LIPASE", "AMYLASE" in the last  72 hours. Cardiac Enzymes No results for input(s): "CKTOTAL", "CKMB", "CKMBINDEX", "TROPONINI" in the last 72 hours. BNP Invalid input(s): "POCBNP" D-Dimer No results for input(s): "DDIMER" in the last 72 hours. Hemoglobin A1C No results for input(s): "HGBA1C" in the last 72 hours. Fasting Lipid Panel No results for input(s): "CHOL", "HDL", "LDLCALC", "TRIG", "CHOLHDL", "LDLDIRECT" in the last 72 hours. Thyroid Function Tests No results for input(s): "TSH", "T4TOTAL", "T3FREE", "THYROIDAB" in the last 72 hours.  Invalid input(s): "FREET3" _____________  ECHOCARDIOGRAM COMPLETE  Result Date: 06/12/2023    ECHOCARDIOGRAM REPORT   Patient Name:   Danny [name] Date of Exam: 06/12/2023 Medical Rec #:  [id number]         Height:       70.0 in Accession #:    [id number]        Weight:       159.9 lb Date of Birth:  1936/06/06         BSA:          1.898 m Patient Age:    87 years          BP:           132/88 mmHg Patient Gender: M                 HR:           75 bpm. Exam Location:  Inpatient Procedure: 2D Echo, Cardiac Doppler and Color Doppler Indications:    post TAVR evaluation  History:        Patient has prior history of Echocardiogram examinations, most                 recent 06/11/2023. CAD,  Arrythmias:first degree av block; Risk                 Factors:Dyslipidemia.                 Aortic Valve: 26 mm Edwards Ultra, stented (TAVR) valve is                 present in the aortic position. Procedure Date: 06/11/23.  Sonographer:    Delcie Roch RDCS Referring Phys: [id number] KATHRYN R THOMPSON IMPRESSIONS  1. Left ventricular ejection fraction, by estimation, is 60 to 65%. The left ventricle has normal function. The left ventricle has no regional wall motion abnormalities. Left ventricular diastolic parameters are indeterminate.  2. Right ventricular systolic function is normal. The right ventricular size is normal. Tricuspid regurgitation signal is inadequate for assessing PA pressure.  3. The mitral valve is normal in structure. Trivial mitral valve regurgitation. No evidence of mitral stenosis.  4. The aortic valve has been repaired/replaced. No aortic stenosis is present. There is a 26 mm Edwards Ultra, stented (TAVR) valve present in the aortic position. Procedure Date: 06/11/23. Aortic valve area, by VTI measures 1.88 cm. Aortic valve mean gradient measures 9.0 mmHg. Aortic valve Vmax measures 1.91 m/s. DVI 0.66. Trivial perivalvular AI at 1pm in the PSAX view.  5. Aortic dilatation noted. There is mild dilatation of the ascending aorta, measuring 38 mm.  6. The inferior vena cava is normal in size with greater than 50% respiratory variability, suggesting right atrial pressure of 3 mmHg. 7. S/P 26mm Edwards Ultra stented TAVR with normal function and trivial perivalvular AI in the parasternal short axis view at 1pm         FINDINGS  Left Ventricle: Left ventricular ejection fraction, by estimation, is 60 to  65%. The left ventricle has normal function. The left ventricle has no regional wall motion abnormalities. The left ventricular internal cavity size was normal in size. There is  no left ventricular hypertrophy. Left ventricular diastolic parameters are indeterminate. Normal left ventricular  filling pressure. Right Ventricle: The right ventricular size is normal. No increase in right ventricular wall thickness. Right ventricular systolic function is normal. Tricuspid regurgitation signal is inadequate for assessing PA pressure. Left Atrium: Left atrial size was normal in size. Right Atrium: Right atrial size was normal in size. Pericardium: There is no evidence of pericardial effusion. Mitral Valve: The mitral valve is normal in structure. Trivial mitral valve regurgitation. No evidence of mitral valve stenosis. Tricuspid Valve: The tricuspid valve is normal in structure. Tricuspid valve regurgitation is not demonstrated. No evidence of tricuspid stenosis. Aortic Valve: The aortic valve has been repaired/replaced. Aortic valve regurgitation is trivial. No aortic stenosis is present. Aortic valve mean gradient measures 9.0 mmHg. Aortic valve peak gradient measures 14.6 mmHg. Aortic valve area, by VTI measures 1.88 cm. There is a 26 mm Edwards Ultra, stented (TAVR) valve present in the aortic position. Procedure Date: 06/11/23. Pulmonic Valve: The pulmonic valve was normal in structure. Pulmonic valve regurgitation is trivial. No evidence of pulmonic stenosis. Aorta: Aortic dilatation noted. There is mild dilatation of the ascending aorta, measuring 38 mm. Venous: The inferior vena cava is normal in size with greater than 50% respiratory variability, suggesting right atrial pressure of 3 mmHg. IAS/Shunts: No atrial level shunt detected by color flow Doppler.  LEFT VENTRICLE PLAX 2D LVIDd:         4.50 cm   Diastology LVIDs:         2.90 cm   LV e' medial:    6.64 cm/s LV PW:         0.90 cm   LV E/e' medial:  13.3 LV IVS:        0.80 cm   LV e' lateral:   8.70 cm/s LVOT diam:     1.90 cm   LV E/e' lateral: 10.2 LV SV:         66 LV SV Index:   35 LVOT Area:     2.84 cm  RIGHT VENTRICLE RV Basal diam:  2.30 cm RV S prime:     17.20 cm/s TAPSE (M-mode): 2.7 cm LEFT ATRIUM             Index        RIGHT  ATRIUM           Index LA diam:        3.70 cm 1.95 cm/m   RA Area:     12.70 cm LA Vol (A2C):   59.4 ml 31.30 ml/m  RA Volume:   25.80 ml  13.59 ml/m LA Vol (A4C):   56.4 ml 29.72 ml/m LA Biplane Vol: 59.0 ml 31.09 ml/m  AORTIC VALVE AV Area (Vmax):    1.98 cm AV Area (Vmean):   1.77 cm AV Area (VTI):     1.88 cm AV Vmax:           191.00 cm/s AV Vmean:          137.000 cm/s AV VTI:            0.351 m AV Peak Grad:      14.6 mmHg AV Mean Grad:      9.0 mmHg LVOT Vmax:         133.50 cm/s LVOT  Vmean:        85.600 cm/s LVOT VTI:          0.233 m LVOT/AV VTI ratio: 0.66  AORTA Ao Root diam: 3.50 cm Ao Asc diam:  3.80 cm MITRAL VALVE MV Area (PHT): 3.12 cm    SHUNTS MV Decel Time: 243 msec    Systemic VTI:  0.23 m MV E velocity: 88.60 cm/s  Systemic Diam: 1.90 cm MV A velocity: 98.10 cm/s MV E/A ratio:  0.90 Armanda Magic MD Electronically signed by Armanda Magic MD Signature Date/Time: 06/12/2023/11:03:38 AM    Final    ECHOCARDIOGRAM LIMITED  Result Date: 06/11/2023    ECHOCARDIOGRAM LIMITED REPORT   Patient Name:   Danny [name] Date of Exam: 06/11/2023 Medical Rec #:  [id number]         Height:       70.0 in Accession #:    [id number]        Weight:       162.0 lb Date of Birth:  May 21, 1936         BSA:          1.909 m Patient Age:    87 years          BP:           145/86 mmHg Patient Gender: M                 HR:           60 bpm. Exam Location:  Inpatient Procedure: Limited Echo, Color Doppler and Cardiac Doppler Indications:     TAVR Implantation  History:         Patient has prior history of Echocardiogram examinations, most                  recent 04/09/2023. CAD, Aortic Valve Disease and 26mm Sapien 3                  TAVR implanted on 06/11/23, Signs/Symptoms:Dyspnea; Risk                  Factors:Dyslipidemia.  Sonographer:     [facility] Referring Phys:  [id number] Orbie Pyo Diagnosing Phys: Riley Lam MD IMPRESSIONS  1. Prior to procedure, there was moderate aortic stenosis with mild to  moderate aortic regurgitation (mixed valve disease). Mean gradient was 30 mm Hg, Peak gradient 52 mm Hg. Aortic valve area by continuity equation 0.80 cm2 on this study; this is related to the decrease in LVOT VTI (20.24m) on sedation study compared to prior resting study (LVOT VTI 2m). A 26 mm Sapien Valve was placed. Mean gradient of 3 mm Hg, Peak gradient 6 mm Hg. DVI normal. EOAi 1.40cm2/m2. No PVL. Successful valve placement with normal prosthetic parameters.  2. Left ventricular ejection fraction, by estimation, is 55 to 60%. The left ventricle has normal function.  3. The mitral valve is degenerative. FINDINGS  Left Ventricle: Left ventricular ejection fraction, by estimation, is 55 to 60%. The left ventricle has normal function. Mitral Valve: The mitral valve is degenerative in appearance. Aortic Valve: Prior to procedure, there was moderate aortic stenosis with mild to moderate aortic regurgitation (mixed valve disease). Mean gradient was 30 mm Hg, Peak gradient 52 mm Hg. Aortic valve area by continuity equation 0.80 cm2 on this study; this is related to the decrease in LVOT VTI (20.36m) on sedation study compared to prior resting study (LVOT VTI 51m). A 26 mm Sapien Valve was  placed. Mean gradient of 3 mm Hg, Peak gradient 6 mm Hg. DVI normal. EOAi 1.40cm2/m2. No PVL. Successful valve placement with normal prosthetic parameters. Aortic valve mean gradient measures 17.0 mmHg. Aortic valve peak gradient measures 24.9 mmHg. Aortic valve area, by VTI measures 1.20 cm. Additional Comments: Spectral Doppler performed. Color Doppler performed.  LEFT VENTRICLE PLAX 2D LVOT diam:     2.20 cm LV SV:         78 LV SV Index:   41 LVOT Area:     3.80 cm  AORTIC VALVE AV Area (Vmax):    1.25 cm AV Area (Vmean):   1.12 cm AV Area (VTI):     1.20 cm AV Vmax:           249.25 cm/s AV Vmean:          176.100 cm/s AV VTI:            0.650 m AV Peak Grad:      24.9 mmHg AV Mean Grad:      17.0 mmHg LVOT Vmax:          81.80 cm/s LVOT Vmean:        51.900 cm/s LVOT VTI:          0.205 m LVOT/AV VTI ratio: 0.32  SHUNTS Systemic VTI:  0.20 m Systemic Diam: 2.20 cm Riley Lam MD Electronically signed by Riley Lam MD Signature Date/Time: 06/11/2023/5:05:31 PM    Final    Structural Heart Procedure  Result Date: 06/11/2023 See surgical note for result.  DG Chest 2 View  Result Date: 06/07/2023 CLINICAL DATA:  Preop for heart surgery 06/11/2023 EXAM: CHEST - 2 VIEW COMPARISON:  CT 05/10/2023 FINDINGS: The cardiomediastinal contours are normal. No radiographic correlate for the right upper lobe ground-glass opacities on CT. Pulmonary vasculature is normal. No consolidation, pleural effusion, or pneumothorax. No acute osseous abnormalities are seen. IMPRESSION: No active cardiopulmonary disease. Electronically Signed   By: Narda Rutherford M.D.   On: 06/07/2023 16:08   Disposition   Pt is being discharged home today in good condition.  Follow-up Plans & Appointments    Follow-up Information     Filbert Schilder, NP. Go on 06/19/2023.   Specialty: Cardiology Why: @ 9:05am, please arrive at least 10 minutes early. Contact information: [address] [place] [address] [phone]                Discharge Instructions     Amb Referral to Cardiac Rehabilitation   Complete by: As directed    Diagnosis: Valve Replacement   Valve: Aortic Comment - TAVR   After initial evaluation and assessments completed: Virtual Based Care may be provided alone or in conjunction with Phase 2 Cardiac Rehab based on patient barriers.: Yes   Intensive Cardiac Rehabilitation (ICR) [facility] location only OR Traditional Cardiac Rehabilitation (TCR) *If criteria for ICR are not met will enroll in TCR [facility] only): Yes   Diet - low sodium heart healthy   Complete by: As directed    Discharge instructions   Complete by: As directed    ACTIVITY AND EXERCISE  Daily activity and exercise are an  important part of your recovery. People recover at different rates depending on their general health and type of valve procedure.  Most people recovering from TAVR feel better relatively quickly   No lifting, pushing, pulling more than 10 pounds (examples to avoid: groceries, vacuuming, gardening, golfing):             -  For one week with a procedure through the groin.             - For six weeks for procedures through the chest wall or neck. NOTE: You will typically see one of our providers 7-14 days after your procedure to discuss WHEN TO RESUME the above activities.      DRIVING  Do not drive until you are seen for follow up and cleared by a provider. Generally, we ask patient to not drive for 1 week after their procedure.  If you have been told by your doctor in the past that you may not drive, you must talk with him/her before you begin driving again.   DRESSING  Groin site: you may leave the clear dressing over the site for up to one week or until it falls off.   HYGIENE  If you had a femoral (leg) procedure, you may take a shower when you return home. After the shower, pat the site dry. Do NOT use powder, oils or lotions in your groin area until the site has completely healed.  If you had a chest procedure, you may shower when you return home unless specifically instructed not to by your discharging practitioner.             - DO NOT scrub incision; pat dry with a towel.             - DO NOT apply any lotions, oils, powders to the incision.             - No tub baths / swimming for at least 2 weeks.  If you notice any fevers, chills, increased pain, swelling, bleeding or pus, please contact your doctor.   ADDITIONAL INFORMATION  If you are going to have an upcoming dental procedure, please contact our office as you will require antibiotics ahead of time to prevent infection on your heart valve.    If you have any questions or concerns you can call the structural heart phone  during normal business hours 8am-4pm. If you have an urgent need after hours or weekends please call [phone] to talk to the on call provider for general cardiology. If you have an emergency that requires immediate attention, please call 911.    After TAVR Checklist  Check  Test Description  Follow up appointment in 1-2 weeks  You will see our structural heart advanced practice provider. Your incision sites will be checked and you will be cleared to drive and resume all normal activities if you are doing well.    1 month echo and follow up  You will have an echo to check on your new heart valve and be seen back in the office by a structural heart advanced practice provider.  Follow up with your primary cardiologist You will need to be seen by your primary cardiologist in the following 3-6 months after your 1 month appointment in the valve clinic.   1 year echo and follow up You will have another echo to check on your heart valve after 1 year and be seen back in the office by a structural heart advanced practice provider. This your last structural heart visit.  Bacterial endocarditis prophylaxis  You will have to take antibiotics for the rest of your life before all dental procedures (even teeth cleanings) to protect your heart valve. Antibiotics are also required before some surgeries. Please check with your cardiologist before scheduling any surgeries. Also, please make sure to tell us if you  have a penicillin allergy as you will require an alternative antibiotic.   Increase activity slowly   Complete by: As directed    Remove dressing in 24 hours   Complete by: As directed       Discharge Medications   Allergies as of 06/12/2023   No Known Allergies      Medication List     TAKE these medications    aspirin EC 81 MG tablet Take 81 mg by mouth in the morning.   betamethasone dipropionate 0.05 % cream Apply 0.5 application  topically 2 (two) times daily as needed (dry/irritated  skin).   CAL-MAG PO Take 1 tablet by mouth in the morning. With Potassium 500 mg /500 mg /99 mg   cholecalciferol 25 MCG (1000 UNIT) tablet Commonly known as: VITAMIN D3 Take 1,000 Units by mouth daily.   clopidogrel 75 MG tablet Commonly known as: PLAVIX Take 1 tablet (75 mg total) by mouth daily with breakfast.   CO Q 10 PO Take 400 mg by mouth in the morning.   finasteride 5 MG tablet Commonly known as: PROSCAR Take 5 mg by mouth in the morning.   furosemide 20 MG tablet Commonly known as: LASIX Take 1 tablet (20 mg total) by mouth as needed (For weight gain of 3 lbs in 24 hours or 5 lbs in a week). For weight gain of 3 lbs in 24 hours or 5 lbs in a week   gabapentin 600 MG tablet Commonly known as: NEURONTIN TAKE 1 TABLET AT 4PM AND TAKE 1 TABLET AT BEDTIME.   gabapentin 100 MG capsule Commonly known as: NEURONTIN Take 1 capsule (100 mg total) by mouth daily as needed.   Magnesium Oxide 400 MG Caps Take 1 capsule (400 mg total) by mouth daily.   metoprolol succinate 25 MG 24 hr tablet Commonly known as: Toprol XL Take 1 tablet (25 mg total) by mouth daily.   multivitamin with minerals Tabs tablet Take 1 tablet by mouth daily. Centrum Silver   nitroGLYCERIN 0.4 MG SL tablet Commonly known as: NITROSTAT ONE TABLET UNDER TONGUE WHEN NEEDED FOR CHEST PAIN. MAY REPEAT IN 5 MINUTES.   pantoprazole 40 MG tablet Commonly known as: Protonix Take 1 tablet (40 mg total) by mouth daily.   PreserVision AREDS 2 Caps Take 1 capsule by mouth in the morning and at bedtime.   PROBIOTIC-10 PO Take 1 capsule by mouth in the morning.   QC Tumeric Complex 500 MG Caps Generic drug: Turmeric Take 500 mg by mouth in the morning.   Refresh 1.4-0.6 % Soln Generic drug: Polyvinyl Alcohol-Povidone PF Place 1-2 drops into both eyes 3 (three) times daily as needed (dry/irritated eyes.).   Restasis 0.05 % ophthalmic emulsion Generic drug: cycloSPORINE Place 1 drop into both  eyes 2 (two) times daily as needed (dry/irritated eyes.).   rosuvastatin 5 MG tablet Commonly known as: CRESTOR TAKE 1 TABLET ONCE DAILY.   Saw Palmetto 160 MG Caps Take 160 mg by mouth 2 (two) times daily.   tamsulosin 0.4 MG Caps capsule Commonly known as: FLOMAX Take 0.4 mg by mouth in the morning and at bedtime.   traMADol 50 MG tablet Commonly known as: ULTRAM Take 50 mg by mouth 3 (three) times daily as needed (back pain.).   traZODone 50 MG tablet Commonly known as: DESYREL Take 25 mg by mouth at bedtime as needed for sleep.   zolpidem 10 MG tablet Commonly known as: AMBIEN Take 10 mg by mouth at bedtime.  Outstanding Labs/Studies   None   Duration of Discharge Encounter   Greater than 30 minutes including physician time.  SignedGeorgie Chard, NP 06/12/2023, 11:16 AM [phone]   ATTENDING ATTESTATION:  After conducting a review of all available clinical information with the care team, interviewing the patient, and performing a physical exam, I agree with the findings and plan described in this note.   GEN: No acute distress.   HEENT:  MMM, no JVD, no scleral icterus Cardiac: RRR, no murmurs, rubs, or gallops.  Respiratory: Clear to auscultation bilaterally. GI: Soft, nontender, non-distended  MS: No edema; No deformity. Neuro:  Nonfocal  Vasc:  +2 radial pulses; access sites intact  Patient with NYHA II and CCS class I symptoms doing well after TAVR for moderate AS as part of continuing access program of PROGRESS trial.  Patient with stable access sites, no evidence of stroke, and no conduction abnormalities; follow up TTE with plan for discharge today with follow up scheduled.  Alverda Skeans, MD Pager [phone]

## 2023-06-11 NOTE — Transfer of Care (Signed)
Immediate Anesthesia Transfer of Care Note  Patient: Danny [name]  Procedure(s) Performed: Transcatheter Aortic Valve Replacement, Transfemoral (Right) INTRAOPERATIVE TRANSTHORACIC ECHOCARDIOGRAM  Patient Location: Cath Lab  Anesthesia Type:MAC  Level of Consciousness: awake, drowsy, patient cooperative, and responds to stimulation  Airway & Oxygen Therapy: Patient Spontanous Breathing and Patient connected to nasal cannula oxygen  Post-op Assessment: Report given to RN, Post -op Vital signs reviewed and stable, Patient moving all extremities X 4, and Patient able to stick tongue midline  Post vital signs: Reviewed and stable  Last Vitals:  Vitals Value Taken Time  BP 115/76   Temp 98.6   Pulse 52 06/11/23 1334  Resp 16 06/11/23 1334  SpO2 94 % 06/11/23 1334  Vitals shown include unfiled device data.  Last Pain:  Vitals:   06/11/23 0710  TempSrc:   PainSc: 0-No pain         Complications: There were no known notable events for this encounter.

## 2023-06-11 NOTE — Discharge Instructions (Signed)
ACTIVITY AND EXERCISE  Daily activity and exercise are an important part of your recovery. People recover at different rates depending on their general health and type of valve procedure.  Most people recovering from TAVR feel better relatively quickly   No lifting, pushing, pulling more than 10 pounds (examples to avoid: groceries, vacuuming, gardening, golfing):             - For one week with a procedure through the groin.             - For six weeks for procedures through the chest wall or neck. NOTE: You will typically see one of our providers 7-14 days after your procedure to discuss WHEN TO RESUME the above activities.      DRIVING  Do not drive until you are seen for follow up and cleared by a provider. Generally, we ask patient to not drive for 1 week after their procedure.  If you have been told by your doctor in the past that you may not drive, you must talk with him/her before you begin driving again.   DRESSING  Groin site: you may leave the clear dressing over the site for up to one week or until it falls off.   HYGIENE  If you had a femoral (leg) procedure, you may take a shower when you return home. After the shower, pat the site dry. Do NOT use powder, oils or lotions in your groin area until the site has completely healed.  If you had a chest procedure, you may shower when you return home unless specifically instructed not to by your discharging practitioner.             - DO NOT scrub incision; pat dry with a towel.             - DO NOT apply any lotions, oils, powders to the incision.             - No tub baths / swimming for at least 2 weeks.  If you notice any fevers, chills, increased pain, swelling, bleeding or pus, please contact your doctor.   ADDITIONAL INFORMATION  If you are going to have an upcoming dental procedure, please contact our office as you will require antibiotics ahead of time to prevent infection on your heart valve.    If you have any questions  or concerns you can call the structural heart phone during normal business hours 8am-4pm. If you have an urgent need after hours or weekends please call [phone] to talk to the on call provider for general cardiology. If you have an emergency that requires immediate attention, please call 911.    After TAVR Checklist  Check  Test Description   Follow up appointment in 1-2 weeks  You will see our structural heart advanced practice provider. Your incision sites will be checked and you will be cleared to drive and resume all normal activities if you are doing well.     1 month echo and follow up  You will have an echo to check on your new heart valve and be seen back in the office by a structural heart advanced practice provider.   Follow up with your primary cardiologist You will need to be seen by your primary cardiologist in the following 3-6 months after your 1 month appointment in the valve clinic.    1 year echo and follow up You will have another echo to check on your heart valve after 1 year  and be seen back in the office by a structural heart advanced practice provider. This your last structural heart visit.   Bacterial endocarditis prophylaxis  You will have to take antibiotics for the rest of your life before all dental procedures (even teeth cleanings) to protect your heart valve. Antibiotics are also required before some surgeries. Please check with your cardiologist before scheduling any surgeries. Also, please make sure to tell us if you have a penicillin allergy as you will require an alternative antibiotic.

## 2023-06-11 NOTE — Progress Notes (Signed)
Pt arrived at the unit from PACU,bilateral groin site level 0, bilateral dorsalis pulse palpable,pt oriented to the unit,CCMD notified,vitals checked

## 2023-06-11 NOTE — Anesthesia Preprocedure Evaluation (Signed)
Anesthesia Evaluation  Patient identified by MRN, date of birth, ID band Patient awake    Reviewed: Allergy & Precautions, H&P , NPO status , Patient's Chart, lab work & pertinent test results  Airway Mallampati: II  TM Distance: >3 FB Neck ROM: Full    Dental no notable dental hx.    Pulmonary neg pulmonary ROS   Pulmonary exam normal breath sounds clear to auscultation       Cardiovascular + CAD  Normal cardiovascular exam+ Valvular Problems/Murmurs AS  Rhythm:Regular Rate:Normal     Neuro/Psych negative neurological ROS  negative psych ROS   GI/Hepatic Neg liver ROS,GERD  ,,  Endo/Other  negative endocrine ROS    Renal/GU negative Renal ROS  negative genitourinary   Musculoskeletal  (+) Arthritis ,    Abdominal   Peds negative pediatric ROS (+)  Hematology negative hematology ROS (+)   Anesthesia Other Findings   Reproductive/Obstetrics negative OB ROS                             Anesthesia Physical Anesthesia Plan  ASA: 4  Anesthesia Plan: MAC   Post-op Pain Management:    Induction: Intravenous  PONV Risk Score and Plan: Propofol infusion and Treatment may vary due to age or medical condition  Airway Management Planned: Natural Airway  Additional Equipment: Arterial line  Intra-op Plan:   Post-operative Plan:   Informed Consent: I have reviewed the patients History and Physical, chart, labs and discussed the procedure including the risks, benefits and alternatives for the proposed anesthesia with the patient or authorized representative who has indicated his/her understanding and acceptance.     Dental advisory given  Plan Discussed with: CRNA  Anesthesia Plan Comments:        Anesthesia Quick Evaluation

## 2023-06-11 NOTE — Progress Notes (Addendum)
  [facility]   MULTIDISCIPLINARY HEART VALVE TEAM  Patient doing well s/p TAVR. He is hemodynamically stable. Groin sites stable. ECG with SB with 1st degree AV block however no high grade block. Transferred from cath lab holding to 4E. Early ambulation after bedrest completed and hopeful discharge over the next 24-48 hours.    Georgie Chard NP-C Structural Heart Team  Pager: [phone] Phone: [phone]

## 2023-06-11 NOTE — Op Note (Signed)
[facility]ASC[facility]  TAVR OPERATIVE NOTE   Date of Procedure:  06/11/2023  Preoperative Diagnosis: Moderate Aortic Stenosis   Postoperative Diagnosis: Same   Procedure:   Transcatheter Aortic Valve Replacement - Transfemoral Approach  Edwards Sapien 3 Resilia THV (size 26 mm, model # 9755RLS, serial # [id number])   Co-Surgeons:   Eugenio Hoes, MD and Alverda Skeans, MD Anesthesiologist:  Anice Paganini, MD  Echocardiographer:  Riley Lam, MD  Pre-operative Echo Findings: Moderate aortic stenosis Normal left ventricular systolic function  Post-operative Echo Findings: No paravalvular leak Normal left ventricular systolic function  Left Heart Catheterization Findings: Left ventricular end-diastolic pressure of   BRIEF CLINICAL NOTE AND INDICATIONS FOR SURGERY  The patient is a 87 year old male with a history of hyperlipidemia, peripheral neuropathy, coronary artery disease status post PCI of the proximal LAD and moderate symptomatic aortic stenosis who is referred for elective transcatheter attic valve replacement with a 26 mm SAPIEN 3 valve from the left transfemoral approach as a part of the continuing access program for the moderate aortic stenosis PROGRESS trial.  During the course of the patient's preoperative work up they have been evaluated comprehensively by a multidisciplinary team of specialists coordinated through the Multidisciplinary Heart Valve Clinic in the [facility].  They have been demonstrated to suffer from symptomatic moderate aortic stenosis as noted above. The patient has been counseled extensively as to the relative risks and benefits of all options for the treatment of severe aortic stenosis including long term medical therapy, conventional surgery for aortic valve replacement, and transcatheter aortic valve replacement.  The patient has been independently evaluated by Dr. Leafy Ro with CT surgery and they are felt to  be at high risk for conventional surgical aortic valve replacement. The surgeon indicated the patient would be a poor candidate for conventional surgery. Based upon review of all of the patient's preoperative diagnostic tests they are felt to be candidate for transcatheter aortic valve replacement using the transfemoral approach as an alternative to high risk conventional surgery.    Following the decision to proceed with transcatheter aortic valve replacement, a discussion has been held regarding what types of management strategies would be attempted intraoperatively in the event of life-threatening complications, including whether or not the patient would be considered a candidate for the use of cardiopulmonary bypass and/or conversion to open sternotomy for attempted surgical intervention.  The patient has been advised of a variety of complications that might develop peculiar to this approach including but not limited to risks of death, stroke, paravalvular leak, aortic dissection or other major vascular complications, aortic annulus rupture, device embolization, cardiac rupture or perforation, acute myocardial infarction, arrhythmia, heart block or bradycardia requiring permanent pacemaker placement, congestive heart failure, respiratory failure, renal failure, pneumonia, infection, other late complications related to structural valve deterioration or migration, or other complications that might ultimately cause a temporary or permanent loss of functional independence or other long term morbidity.  The patient provides full informed consent for the procedure as described and all questions were answered preoperatively.    DETAILS OF THE OPERATIVE PROCEDURE  PREPARATION:   The patient is brought to the operating room on the above mentioned date and central monitoring was established by the anesthesia team including placement of a radial arterial line. The patient is placed in the supine position on the  operating table.  Intravenous antibiotics are administered. Conscious sedation is used.   Baseline transthoracic echocardiogram was performed. The patient's chest, abdomen, both  groins, and both lower extremities are prepared and draped in a sterile manner. A time out procedure is performed.   PERIPHERAL ACCESS:   Using the modified Seldinger technique, femoral arterial and venous access were obtained with placement of a 6 Fr sheath in the right artery using u/s guidance.  A pigtail diagnostic catheter was passed through the femoral arterial sheath under fluoroscopic guidance into the aortic root.  Aortic root angiography was performed in order to determine the optimal angiographic angle for valve deployment.  TRANSFEMORAL ACCESS:  A micropuncture kit was used to gain access to the left femoral artery using u/s guidance. Position confirmed with angiography. Pre-closure with double ProGlide closure devices. The patient was heparinized systemically and ACT verified > 250 seconds.    A 14 Fr transfemoral E-sheath was introduced into the left femoral artery after progressively dilating over an Amplatz superstiff wire. An AL-1 catheter was used to direct a straight-tip exchange length wire across the native aortic valve into the left ventricle. This was exchanged out for a pigtail catheter and position was confirmed in the LV apex. Simultaneous left ventricular, aortic, and left ventricular end-diastolic pressures were recorded.  The pigtail catheter was then exchanged for an Safari wire in the LV apex.  Direct LV pacing thresholds were assessed and found to be adequate.   TRANSCATHETER HEART VALVE DEPLOYMENT:  An Edwards Sapien 3 THV (size 26 mm) was prepared and crimped per manufacturer's guidelines, and the proper orientation of the valve is confirmed on the [employer] delivery system. The valve was advanced through the introducer sheath using normal technique until in an appropriate position in  the abdominal aorta beyond the sheath tip. The balloon was then retracted and using the fine-tuning wheel was centered on the valve. The valve was then advanced across the aortic arch using appropriate flexion of the catheter. The valve was carefully positioned across the aortic valve annulus. The Commander catheter was retracted using normal technique. Once final position of the valve has been confirmed by angiographic assessment, the valve is deployed while temporarily holding ventilation and during rapid ventricular pacing to maintain systolic blood pressure < 50 mmHg and pulse pressure < 10 mmHg. The balloon inflation is held for >3 seconds after reaching full deployment volume. Once the balloon has fully deflated the balloon is retracted into the ascending aorta and valve function is assessed using TTE. There is felt to be no paravalvular leak and no central aortic insufficiency.  The patient's hemodynamic recovery following valve deployment is good.  The deployment balloon and guidewire are both removed. Echo demostrated acceptable post-procedural gradients, stable mitral valve function, and no AI.   PROCEDURE COMPLETION:  The sheath was then removed and closure devices were completed. Protamine was administered once femoral arterial repair was complete. The pigtail catheters and femoral sheaths were removed with a Mynx closure device placed in the artery and manual pressure used for venous hemostasis.    The patient tolerated the procedure well and is transported to the surgical intensive care in stable condition. There were no immediate intraoperative complications. All sponge instrument and needle counts are verified correct at completion of the operation.   No blood products were administered during the operation.  The patient received a total of 70 mL of intravenous contrast during the procedure.  Orbie Pyo MD 06/11/2023 1:28 PM

## 2023-06-11 NOTE — Op Note (Signed)
[facility]ASCULAR [facility]ENTER   MULTIDISCIPLINARY HEART VALVE TEAM   TAVR OPERATIVE NOTE   Date of Procedure:  06/11/2023  Preoperative Diagnosis: Moderate Aortic Stenosis   Postoperative Diagnosis: Same   Procedure:   Transcatheter Aortic Valve Replacement - Percutaneous right Transfemoral Approach  Edwards Sapien 3 Ultra THV (size 26 mm, model # [id number],)   Co-Surgeons:  Eugenio Hoes MD and Alverda Skeans, MD   Anesthesiologist:  Anice Paganini MD  Echocardiographer:  Dr Merri Ray  Pre-operative Echo Findings: Moderate aortic stenosis normal left ventricular systolic function  Post-operative Echo Findings: no paravalvular leak normal left ventricular systolic function   BRIEF CLINICAL NOTE AND INDICATIONS FOR SURGERY  87 yo with NYHA class 2 symptoms of moderate AS with normal LV function. Pt now sp PCI stent of LAD. Pt accepted into the PROGRESS trial. I have reviewed the risks and goals and recovery of TAVR with pt and wife and they wish to proceed.     DETAILS OF THE OPERATIVE PROCEDURE  PREPARATION:    The patient was brought to the operating room on the above mentioned date and appropriate monitoring was established by the anesthesia team. The patient was placed in the supine position on the operating table.  Intravenous antibiotics were administered. The patient was monitored closely throughout the procedure under conscious sedation.  Baseline transthoracic echocardiogram was performed. The patient's abdomen and both groins were prepped and draped in a sterile manner. A time out procedure was performed.   PERIPHERAL ACCESS:    Using the modified Seldinger technique, femoral arterial  access was obtained with placement of 6 Fr sheath on the right side.  A pigtail diagnostic catheter was passed through the right arterial sheath under fluoroscopic guidance into the aortic root. Aortic root angiography was performed in order to determine the optimal angiographic angle  for valve deployment.   TRANSFEMORAL ACCESS:   Percutaneous transfemoral access and sheath placement was performed using ultrasound guidance.  The left common femoral artery was cannulated using a micropuncture needle and appropriate location was verified using hand injection angiogram.  A pair of Abbott Perclose percutaneous closure devices were placed and a 6 French sheath replaced into the femoral artery.  The patient was heparinized systemically and ACT verified > 250 seconds.    A 14 Fr transfemoral E-sheath was introduced into the left common femoral artery after progressively dilating over an Amplatz superstiff wire. An AL2 catheter was used to direct a straight-tip exchange length wire across the native aortic valve into the left ventricle. This was exchanged out for a pigtail catheter and position was confirmed in the LV apex. Simultaneous LV and Ao pressures were recorded.LVEDP was 10  The pigtail catheter was exchanged for a Safari wire in the LV apex. This was used for direct LV pacing and the pacemaker was tested to ensure stable lead placement and pacemaker capture.   BALLOON AORTIC VALVULOPLASTY:   Balloon aortic valvuloplasty was not performed   TRANSCATHETER HEART VALVE DEPLOYMENT:   An Edwards Sapien 3 Ultra transcatheter heart valve (size 26 mm) was prepared and crimped per manufacturer's guidelines, and the proper orientation of the valve is confirmed on the [employer]. The valve was advanced through the introducer sheath using normal technique until in an appropriate position in the abdominal aorta beyond the sheath tip. The balloon was then retracted and using the fine-tuning wheel was centered on the valve. The valve was then advanced across the aortic arch using appropriate flexion of the catheter.  The valve was carefully positioned across the aortic valve annulus. The Commander catheter was retracted using normal technique. Once final position of the  valve has been confirmed by angiographic assessment, the valve is deployed during rapid ventricular pacing to maintain systolic blood pressure < 50 mmHg and pulse pressure < 10 mmHg. The balloon inflation is held for >3 seconds after reaching full deployment volume. Once the balloon has fully deflated the balloon is retracted into the ascending aorta and valve function is assessed using echocardiography. There is felt to be no paravalvular leak and no central aortic insufficiency.  The patient's hemodynamic recovery following valve deployment is good.  The deployment balloon and guidewire are both removed.    PROCEDURE COMPLETION:   The sheath was removed and femoral artery closure performed.  Protamine was administered once femoral arterial repair was complete. The temporary pacemaker, pigtail catheter and femoral sheaths were removed with manual pressure used for venous hemostasis.  A Mynx femoral closure device was utilized following removal of the diagnostic sheath in the right femoral artery.  The patient tolerated the procedure well and is transported to the cath lab recovery area in stable condition. There were no immediate intraoperative complications. All sponge instrument and needle counts are verified correct at completion of the operation.   No blood products were administered during the operation.  The patient received a total of 50 mL of intravenous contrast during the procedure.   Eugenio Hoes, MD 06/11/2023 1:24 PM

## 2023-06-11 NOTE — Interval H&P Note (Signed)
History and Physical Interval Note:  06/11/2023 11:41 AM  Danny [name]  has presented today for surgery, with the diagnosis of Moderate Aortic Stenosis.  The various methods of treatment have been discussed with the patient and family. After consideration of risks, benefits and other options for treatment, the patient has consented to  Procedure(s): Transcatheter Aortic Valve Replacement, Transfemoral (Right) INTRAOPERATIVE TRANSTHORACIC ECHOCARDIOGRAM ([name]) as a surgical intervention.  The patient's history has been reviewed, patient examined, no change in status, stable for surgery.  I have reviewed the patient's chart and labs.  Questions were answered to the patient's satisfaction.     Eugenio Hoes

## 2023-06-11 NOTE — Anesthesia Postprocedure Evaluation (Signed)
Anesthesia Post Note  Patient: Danny [name]  Procedure(s) Performed: Transcatheter Aortic Valve Replacement, Transfemoral (Right) INTRAOPERATIVE TRANSTHORACIC ECHOCARDIOGRAM     Patient location during evaluation: PACU Anesthesia Type: MAC Level of consciousness: awake and alert Pain management: pain level controlled Vital Signs Assessment: post-procedure vital signs reviewed and stable Respiratory status: spontaneous breathing, nonlabored ventilation, respiratory function stable and patient connected to nasal cannula oxygen Cardiovascular status: stable and blood pressure returned to baseline Postop Assessment: no apparent nausea or vomiting Anesthetic complications: no   There were no known notable events for this encounter.  Last Vitals:  Vitals:   06/11/23 1405 06/11/23 1409  BP: 109/79   Pulse: (!) 49 (!) 48  Resp: 17 15  Temp:  36.6 C  SpO2: 95% 94%    Last Pain:  Vitals:   06/11/23 1409  TempSrc: Temporal  PainSc:                  [place]

## 2023-06-12 ENCOUNTER — Encounter (HOSPITAL_COMMUNITY): Payer: Self-pay | Admitting: Internal Medicine

## 2023-06-12 ENCOUNTER — Inpatient Hospital Stay (HOSPITAL_COMMUNITY): Payer: Medicare Other

## 2023-06-12 DIAGNOSIS — Z952 Presence of prosthetic heart valve: Secondary | ICD-10-CM

## 2023-06-12 LAB — BASIC METABOLIC PANEL
Anion gap: 10 (ref 5–15)
BUN: 11 mg/dL (ref 8–23)
CO2: 21 mmol/L — ABNORMAL LOW (ref 22–32)
Calcium: 9 mg/dL (ref 8.9–10.3)
Chloride: 104 mmol/L (ref 98–111)
Creatinine, Ser: 0.96 mg/dL (ref 0.61–1.24)
GFR, Estimated: >60 mL/min (ref >60)
Glucose, Bld: 115 mg/dL — ABNORMAL HIGH (ref 70–99)
Potassium: 3.8 mmol/L (ref 3.5–5.1)
Sodium: 135 mmol/L (ref 135–145)

## 2023-06-12 LAB — CBC
HCT: 36.7 % — ABNORMAL LOW (ref 39.0–52.0)
Hemoglobin: 12.4 g/dL — ABNORMAL LOW (ref 13.0–17.0)
MCH: 30.6 pg (ref 26.0–34.0)
MCHC: 33.8 g/dL (ref 30.0–36.0)
MCV: 90.6 fL (ref 80.0–100.0)
Platelets: 138 10*3/uL — ABNORMAL LOW (ref 150–400)
RBC: 4.05 MIL/uL — ABNORMAL LOW (ref 4.22–5.81)
RDW: 12.9 % (ref 11.5–15.5)
WBC: 9.8 10*3/uL (ref 4.0–10.5)
nRBC: 0 % (ref 0.0–0.2)

## 2023-06-12 LAB — ECHOCARDIOGRAM COMPLETE
AR max vel: 1.98 cm2
AV Area VTI: 1.88 cm2
AV Area mean vel: 1.77 cm2
AV Mean grad: 9 mmHg
AV Peak grad: 14.6 mmHg
Ao pk vel: 1.91 m/s
Area-P 1/2: 3.12 cm2
Height: 70 in
S' Lateral: 2.9 cm
Weight: 2558.4 oz

## 2023-06-12 LAB — MAGNESIUM: Magnesium: 1.7 mg/dL (ref 1.7–2.4)

## 2023-06-12 NOTE — Progress Notes (Signed)
Mobility Specialist Progress Note:   06/12/23 1122  Mobility  Activity Ambulated with assistance in hallway  Level of Assistance Standby assist, set-up cues, supervision of patient - no hands on  Assistive Device  (IV Pole)  Distance Ambulated (ft) 400 ft  Activity Response Tolerated well  Mobility Referral Yes  $Mobility charge 1 Mobility  Mobility Specialist Start Time (ACUTE ONLY) 0930  Mobility Specialist Stop Time (ACUTE ONLY) 0940  Mobility Specialist Time Calculation (min) (ACUTE ONLY) 10 min   Pre Mobility: 101 HR During Mobility: 96 HR Post Mobility: 89 HR   Pt received in bed, agreeable to mobility. Denied any discomfort during ambulation, asymptomatic throughout. Pt returned to bed with call bell in reach and all needs met.   Leory Plowman  Mobility Specialist Please contact via [employer] office at [phone]

## 2023-06-12 NOTE — Progress Notes (Signed)
Pt is alert and fully oriented x 4, stable hemodynamically, afebrile, normal respiratory efforts. EKG is SB with 1st degree AV-block. No acute distress noted. Pain is well controlled. He is well tolerated ambulation in room and hallway with walker and one standby assist. Bilateral groin sites are negative for bleeding or hematoma. Palpable  DP and PT pulses bilaterally. We will continue to monitor.   Filiberto Pinks, RN

## 2023-06-12 NOTE — Progress Notes (Signed)
    06/12/23 0521  Mobility  HOB Elevated/Bed Position Self regulated  Activity Ambulated with assistance in hallway  Range of Motion/Exercises Active;All extremities  Level of Assistance Standby assist, set-up cues, supervision of patient - no hands on  Assistive Device None  Distance Ambulated (ft) 470 ft   Activity Response Tolerated well  Transport method Ambulatory   Filiberto Pinks, RN

## 2023-06-12 NOTE — Progress Notes (Signed)
Pt has ambulated multiple times, feels well. Discussed with pt and wife restrictions, exercise, and CRPII. Pt receptive, eager to do CRPII. Will send referral to [place] again (referral was on hold until TAVR). [id number] Ethelda Chick BS, ACSM-CEP 06/12/2023 10:17 AM

## 2023-06-12 NOTE — Progress Notes (Signed)
  Echocardiogram 2D Echocardiogram has been performed.  Delcie Roch 06/12/2023, 9:33 AM

## 2023-06-13 ENCOUNTER — Telehealth: Payer: Self-pay | Admitting: Physician Assistant

## 2023-06-13 NOTE — Telephone Encounter (Signed)
[facility]   MULTIDISCIPLINARY HEART VALVE TEAM  Left VM to call back.   Cline Crock PA-C  [facility]

## 2023-06-13 NOTE — Telephone Encounter (Signed)
  [facility] VASCULAR [facility]ENTER   MULTIDISCIPLINARY HEART VALVE TEAM   Patient contacted regarding discharge from [facility] on 9/25  Patient understands to follow up with a structural heart APP on 10/2 at [address] [facility].  Patient understands discharge instructions? yes Patient understands medications and regimen? yes Patient understands to bring all medications to this visit? yes  Cline Crock PA-C  [facility]

## 2023-06-18 NOTE — Progress Notes (Unsigned)
HEART AND VASCULAR CENTER   MULTIDISCIPLINARY HEART VALVE TEAM  Structural Heart Office Note:  .   Date:  06/18/2023  ID:  Danny [name], DOB Aug 20, 1936, MRN [id number] PCP: Danny Aspen, MD  [facility] Providers Cardiologist:  Marca Ancona, MD { Click to update primary MD,subspecialty MD or APP then REFRESH:1}    History of Present Illness: Danny [name] is a 87 y.o. male with a history of HLD, 1st deg AV block, peripheral neuropathy, CAD s/p recent PCI to pLAD, and moderate AS who presented to [facility] on 06/11/23 for planned TAVR through the PROGRESS CAP trial.    Danny [name] has been followed by Dr. Shirlee Latch for his cardiology care. He underwent cardiac amyloid evaluation in 2021 which was found to be equivocal for transthyretin amyloid and thought to be negative. Serial echocardiograms have shown a preserved ejection fraction however the development of worsening aortic stenosis and associated severe calcification of the aortic valve. Most recent echocardiogram demonstrated moderate aortic stenosis with an aortic valve area of 1.1 cm2 and mean gradient of 33 mmHg with peak velocity of 3.68 m/s with ejection fraction of 60%. He was referred to the structural heart team and was felt to have symptomatic moderate aortic stenosis and was considered for PROGRESS CAP trial and ultimately met all inclusion criteria to proceed. [facility] 05/02/23 showed normal filling pressures, 95% focal proximal LAD stenosis after 2nd diagonal s/p PCI/DES to mLAD. Pre TAVR CT imaging with anatomy suitable for 26mm S3UR valve placement.    He was then evaluated by the multidisciplinary valve team and felt to have symptomatic moderate aortic stenosis and to be suitable for PROGRESS TRIAL CAP TAVR, which was set up for 06/11/23.     Moderate AS: s/p successful TAVR with a 26 mm Edwards Sapien 3 THV via the TF approach on 06/11/23 through the PROGRESS CAP Trial. Post operative echo completed with  stable valve function. Groin sites remain stable. ECG with NSR and no high grade heart block. Plan to continue DAPT with ASA 81mg  and Plavix 75mg  daily given recent PCI. Likely to continue for one year then lifelong ASA. Patient ambulated with CRI with no issues. He will require lifelong dental SBE with Amoxicillin. This was discussed and will be RX'd at [facility] follow up next week.    1st AV block: Improved today with a PR interval at with improved HR into the 60-70's. Plan to restart home Toprol today.    CAD: Pre-TAVR cath showed a high-grade mid LAD lesion treated with OCT guided PCI with cutting balloon angioplasty and drug-eluting stent x 1 on 05/02/23. Continue DAPT with ASA 81mg  and Plavix 75mg  daily. Denies anginal symptoms. Continue Crestor 5mg  daily. Toprol 25mg  daily.      Studies Reviewed: .   Cardiac Studies & Procedures   CARDIAC CATHETERIZATION  CARDIAC CATHETERIZATION 05/02/2023  Narrative   Mid LAD lesion is 95% stenosed.   A stent was successfully placed.   Post intervention, there is a 0% residual stenosis.  1.  High-grade mid LAD lesion treated with OCT guided PCI with Cutting Balloon angioplasty and drug-eluting stent x 1.  Recommendation: Continue evaluation for aortic valve intervention.  Findings Coronary Findings Diagnostic  Dominance: Right  Left Anterior Descending Mid LAD lesion is 95% stenosed.  Intervention  Mid LAD lesion Stent A stent was successfully placed. Post-Intervention Lesion Assessment The intervention was successful. Pre-interventional TIMI flow is 3. Post-intervention TIMI flow is 3. There  is a 0% residual stenosis post intervention.   CARDIAC CATHETERIZATION 05/02/2023  Narrative   Mid LAD lesion is 95% stenosed.   Mid RCA lesion is 25% stenosed.  1. Normal filling pressures. 2. 95% focal proximal LAD stenosis after 2nd diagonal.  Discussed with Dr. Lynnette Caffey, given significant exertional dyspnea as possible anginal  equivalent, will plan PCI to LAD today.  Findings Coronary Findings Diagnostic  Dominance: Right  Left Anterior Descending Mid LAD lesion is 95% stenosed.  Right Coronary Artery Mid RCA lesion is 25% stenosed.  Intervention  No interventions have been documented.   STRESS TESTS  MYOCARDIAL PERFUSION IMAGING 09/20/2021  Narrative   Findings are consistent with no prior ischemia and no prior myocardial infarction. The study is low risk.   No ST deviation was noted.   Left ventricular function is normal. Nuclear stress EF: 68 %. The left ventricular ejection fraction is hyperdynamic (>65%). End diastolic cavity size is normal.   Prior study available for comparison from 03/15/2015.  Findings: Negative for stress induced arrhythmias. No evidence of ischemia or infarction.  Conclusions: Stress test is negative. Low risk study. Less artifact that 2016 study, no other significant change.   ECHOCARDIOGRAM  ECHOCARDIOGRAM COMPLETE 06/12/2023  Narrative ECHOCARDIOGRAM REPORT    Patient Name:   Danny [name] Date of Exam: 06/12/2023 Medical Rec #:  [id number]         Height:       70.0 in Accession #:    [id number]        Weight:       159.9 lb Date of Birth:  [birth year]         BSA:          1.898 m Patient Age:    87 years          BP:           132/88 mmHg Patient Gender: M                 HR:           75 bpm. Exam Location:  Inpatient  Procedure: 2D Echo, Cardiac Doppler and Color Doppler  Indications:    post TAVR evaluation  History:        Patient has prior history of Echocardiogram examinations, most recent 06/11/2023. CAD, Arrythmias:first degree av block; Risk Factors:Dyslipidemia. Aortic Valve: 26 mm Edwards Ultra, stented (TAVR) valve is present in the aortic position. Procedure Date: 06/11/23.  Sonographer:    Delcie Roch RDCS Referring Phys: [id number] Danny [name]  IMPRESSIONS   1. Left ventricular ejection fraction, by estimation, is 60  to 65%. The left ventricle has normal function. The left ventricle has no regional wall motion abnormalities. Left ventricular diastolic parameters are indeterminate. 2. Right ventricular systolic function is normal. The right ventricular size is normal. Tricuspid regurgitation signal is inadequate for assessing PA pressure. 3. The mitral valve is normal in structure. Trivial mitral valve regurgitation. No evidence of mitral stenosis. 4. The aortic valve has been repaired/replaced. No aortic stenosis is present. There is a 26 mm Edwards Ultra, stented (TAVR) valve present in the aortic position. Procedure Date: 06/11/23. Aortic valve area, by VTI measures 1.88 cm. Aortic valve mean gradient measures 9.0 mmHg. Aortic valve Vmax measures 1.91 m/s. DVI 0.66. Trivial perivalvular AI at 1pm in the PSAX view. 5. Aortic dilatation noted. There is mild dilatation of the ascending aorta, measuring 38 mm. 6. The inferior vena cava is normal in size with  greater than 50% respiratory variability, suggesting right atrial pressure of 3 mmHg. 7. S/P 26mm Edwards Ultra stented TAVR with normal function and trivial perivalvular AI in the parasternal short axis view at 1pm    FINDINGS Left Ventricle: Left ventricular ejection fraction, by estimation, is 60 to 65%. The left ventricle has normal function. The left ventricle has no regional wall motion abnormalities. The left ventricular internal cavity size was normal in size. There is no left ventricular hypertrophy. Left ventricular diastolic parameters are indeterminate. Normal left ventricular filling pressure.  Right Ventricle: The right ventricular size is normal. No increase in right ventricular wall thickness. Right ventricular systolic function is normal. Tricuspid regurgitation signal is inadequate for assessing PA pressure.  Left Atrium: Left atrial size was normal in size.  Right Atrium: Right atrial size was normal in size.  Pericardium: There is no  evidence of pericardial effusion.  Mitral Valve: The mitral valve is normal in structure. Trivial mitral valve regurgitation. No evidence of mitral valve stenosis.  Tricuspid Valve: The tricuspid valve is normal in structure. Tricuspid valve regurgitation is not demonstrated. No evidence of tricuspid stenosis.  Aortic Valve: The aortic valve has been repaired/replaced. Aortic valve regurgitation is trivial. No aortic stenosis is present. Aortic valve mean gradient measures 9.0 mmHg. Aortic valve peak gradient measures 14.6 mmHg. Aortic valve area, by VTI measures 1.88 cm. There is a 26 mm Edwards Ultra, stented (TAVR) valve present in the aortic position. Procedure Date: 06/11/23.  Pulmonic Valve: The pulmonic valve was normal in structure. Pulmonic valve regurgitation is trivial. No evidence of pulmonic stenosis.  Aorta: Aortic dilatation noted. There is mild dilatation of the ascending aorta, measuring 38 mm.  Venous: The inferior vena cava is normal in size with greater than 50% respiratory variability, suggesting right atrial pressure of 3 mmHg.  IAS/Shunts: No atrial level shunt detected by color flow Doppler.   LEFT VENTRICLE PLAX 2D LVIDd:         4.50 cm   Diastology LVIDs:         2.90 cm   LV e' medial:    6.64 cm/s LV PW:         0.90 cm   LV E/e' medial:  13.3 LV IVS:        0.80 cm   LV e' lateral:   8.70 cm/s LVOT diam:     1.90 cm   LV E/e' lateral: 10.2 LV SV:         66 LV SV Index:   35 LVOT Area:     2.84 cm   RIGHT VENTRICLE RV Basal diam:  2.30 cm RV S prime:     17.20 cm/s TAPSE (M-mode): 2.7 cm  LEFT ATRIUM             Index        RIGHT ATRIUM           Index LA diam:        3.70 cm 1.95 cm/m   RA Area:     12.70 cm LA Vol (A2C):   59.4 ml 31.30 ml/m  RA Volume:   25.80 ml  13.59 ml/m LA Vol (A4C):   56.4 ml 29.72 ml/m LA Biplane Vol: 59.0 ml 31.09 ml/m AORTIC VALVE AV Area (Vmax):    1.98 cm AV Area (Vmean):   1.77 cm AV Area (VTI):      1.88 cm AV Vmax:           191.00 cm/s  AV Vmean:          137.000 cm/s AV VTI:            0.351 m AV Peak Grad:      14.6 mmHg AV Mean Grad:      9.0 mmHg LVOT Vmax:         133.50 cm/s LVOT Vmean:        85.600 cm/s LVOT VTI:          0.233 m LVOT/AV VTI ratio: 0.66  AORTA Ao Root diam: 3.50 cm Ao Asc diam:  3.80 cm  MITRAL VALVE MV Area (PHT): 3.12 cm    SHUNTS MV Decel Time: 243 msec    Systemic VTI:  0.23 m MV E velocity: 88.60 cm/s  Systemic Diam: 1.90 cm MV A velocity: 98.10 cm/s MV E/A ratio:  0.90  Armanda Magic MD Electronically signed by Armanda Magic MD Signature Date/Time: 06/12/2023/11:03:38 AM    Final     CT SCANS  CT CORONARY MORPH W/CTA COR W/SCORE 05/10/2023  Addendum 05/10/2023  1:09 PM ADDENDUM REPORT: 05/10/2023 13:06  EXAM: OVER-READ INTERPRETATION  CT CHEST  The following report is an over-read performed by radiologist Dr. Jacob Moores [facility] on 05/10/2023. This over-read does not include interpretation of cardiac or coronary anatomy or pathology. The cardiac TAVR interpretation by the cardiologist is attached.  COMPARISON:  None.  FINDINGS: Extracardiac findings will be described separately under dictation for contemporaneously obtained CTA chest, abdomen and pelvis.  IMPRESSION: Please see separate dictation for contemporaneously obtained CTA chest, abdomen and pelvis dated 05/10/2023 for full description of relevant extracardiac findings.   Electronically Signed By: Allegra Lai M.D. On: 05/10/2023 13:06  Narrative CLINICAL DATA:  Aortic Stenosis  EXAM: Cardiac TAVR CT  TECHNIQUE: The patient was scanned on a Siemens Force 192 slice scanner. A 120 kV retrospective scan was triggered in the ascending thoracic aorta at 140 HU's. Gantry rotation speed was 250 msecs and collimation was .6 mm. No beta blockade or nitro were given. The 3D data set was reconstructed in 5% intervals of the R-R cycle.  Systolic and diastolic phases were analyzed on a dedicated work station using MPR, MIP and VRT modes. The patient received 80 cc of contrast.  FINDINGS: Aortic Valve: Tri leaflet calcified with restricted leaflet motion Calcium score 2196  Aorta: Bovine arch mild calcific atherosclerosis  Sino-tubular Junction: 30 mm  Ascending Thoracic Aorta: 38 mm  Aortic Arch: 33 mm  Descending Thoracic Aorta: 25 mm  Sinus of Valsalva Measurements:  Non-coronary: 35.7 mm  Height 22.7 mm  Right - coronary: 34.0 mm  Height 23.3 mm  Left -   coronary: 34.9 mm  Height 22 mm  Coronary Artery Height above Annulus:  Left Main: 15.4 mm above annulus  Right Coronary: 20.2 mm above annulus  Virtual Basal Annulus Measurements:  Maximum / Minimum Diameter: 27.2 mm x 22.9 mm Average diameter 24.9 mm  Perimeter:  80  Area: 488 mm2  Coronary Arteries: Sufficient height above annulus for deployment  Optimum Fluoroscopic Angle for Delivery: LAO 9 Caudal 18 degrees  Membranous septal length 5.8 mm  IMPRESSION: 1.  Calcified tri leaflet AV with score 2196  2. Annular Area of 488 mm2 suitable for a 26 mm Sapien 3 valve Alternatively can consider 29 mm Medtronic but with recent stenting to LAD access to coronary arteries in future may not be ideal  3.  Coronary arteries suitable height above annulus for deployment  4.  Optimum angiographic angle for deployment LAO 9 Caudal 18 degrees  5.  Membranous septal length 5.8 mm  Charlton Haws  Electronically Signed: By: Charlton Haws M.D. On: 05/10/2023 12:29   CT SCANS  CT CORONARY MORPH W/CTA COR W/SCORE 06/20/2011  Narrative **ADDENDUM** CREATED: 06/21/2011 08:45:41  OVER-READ INTERPRETATION - CT CHEST  The following report is an over-read performed by radiologist Dr. Charline Bills, M.D. of [facility] on 06/21/2011 08:45:41.  This over-read does not include interpretation of cardiac or coronary anatomy or  pathology.  The CTA interpretation by the cardiologist is attached.  Comparison:  Chest radiograph dated 09/02/2009.  Findings: Mild emphysematous changes.  Mild dependent atelectasis in the bilateral lower lobes.  No suspicious pulmonary nodules.  No pleural effusion or pneumothorax.  No suspicious mediastinal or hilar lymphadenopathy.  Mild degenerate changes of the visualized thoracic spine.  IMPRESSION: Mild emphysematous changes.  **END ADDENDUM** SIGNED BY: Charline Bills, M.D. Original Report Authenticated By: Charline Bills, M.D.            Risk Assessment/Calculations:   {Does this patient have ATRIAL FIBRILLATION?:[phone]} No BP recorded.  {Refresh Note OR Click here to enter BP  :1}***         {This patient may be at risk for Amyloid.  Click HERE to open Cardiac Amyloid Screening SmartSet to order screening OR Click HERE to defer testing for 1 year or permanently :1}      Physical Exam:   VS:  There were no vitals taken for this visit.   Wt Readings from Last 3 Encounters:  06/12/23 159 lb 14.4 oz (72.5 kg)  06/03/23 164 lb (74.4 kg)  05/15/23 164 lb 3.2 oz (74.5 kg)     General: Well developed, well nourished, NAD Skin: Warm, dry, intact  Head: Normocephalic, atraumatic, sclera non-icteric, no xanthomas, clear, moist mucus membranes. Neck: Negative for carotid bruits. No JVD Lungs:Clear to ausculation bilaterally. No wheezes, rales, or rhonchi. Breathing is unlabored. Cardiovascular: RRR with S1 S2. No murmurs, rubs, gallops, or LV heave appreciated. Abdomen: Soft, non-tender, non-distended with normoactive bowel sounds. No hepatomegaly, No rebound/guarding. No obvious abdominal masses. MSK: Strength and tone appear normal for age. 5/5 in all extremities Extremities: No edema. No clubbing or cyanosis. DP/PT pulses 2+ bilaterally Neuro: Alert and oriented. No focal deficits. No facial asymmetry. MAE spontaneously. Psych: Responds to questions  appropriately with normal affect.    ASSESSMENT AND PLAN: .   *** {The patient has an active order for outpatient cardiac rehabilitation.   Please indicate if the patient is ready to start. Do NOT delete this.  It will auto delete.  Refresh note, then sign.              Click here to document readiness and see contraindications.  :1}  Cardiac Rehabilitation Eligibility Assessment      {Are you ordering a CV Procedure (e.g. stress test, cath, DCCV, TEE, etc)?   Press F2        :[id number]}  Dispo: ***  Signed, Georgie Chard, NP

## 2023-06-19 ENCOUNTER — Ambulatory Visit: Payer: Medicare Other | Attending: Cardiology | Admitting: Cardiology

## 2023-06-19 VITALS — BP 130/82 | HR 66 | Wt 163.6 lb

## 2023-06-19 DIAGNOSIS — I350 Nonrheumatic aortic (valve) stenosis: Secondary | ICD-10-CM | POA: Diagnosis present

## 2023-06-19 DIAGNOSIS — Z7902 Long term (current) use of antithrombotics/antiplatelets: Secondary | ICD-10-CM | POA: Diagnosis not present

## 2023-06-19 DIAGNOSIS — Z48812 Encounter for surgical aftercare following surgery on the circulatory system: Secondary | ICD-10-CM | POA: Insufficient documentation

## 2023-06-19 DIAGNOSIS — Z955 Presence of coronary angioplasty implant and graft: Secondary | ICD-10-CM | POA: Diagnosis not present

## 2023-06-19 DIAGNOSIS — I2510 Atherosclerotic heart disease of native coronary artery without angina pectoris: Secondary | ICD-10-CM | POA: Diagnosis present

## 2023-06-19 DIAGNOSIS — Z9861 Coronary angioplasty status: Secondary | ICD-10-CM | POA: Diagnosis not present

## 2023-06-19 DIAGNOSIS — Z952 Presence of prosthetic heart valve: Secondary | ICD-10-CM | POA: Insufficient documentation

## 2023-06-19 DIAGNOSIS — Z006 Encounter for examination for normal comparison and control in clinical research program: Secondary | ICD-10-CM | POA: Insufficient documentation

## 2023-06-19 DIAGNOSIS — Z79899 Other long term (current) drug therapy: Secondary | ICD-10-CM | POA: Insufficient documentation

## 2023-06-19 DIAGNOSIS — I440 Atrioventricular block, first degree: Secondary | ICD-10-CM | POA: Diagnosis not present

## 2023-06-19 DIAGNOSIS — Z7982 Long term (current) use of aspirin: Secondary | ICD-10-CM | POA: Insufficient documentation

## 2023-06-19 MED ORDER — AMOXICILLIN 500 MG PO CAPS: 2000.0000 mg | ORAL_CAPSULE | Freq: Once | ORAL | 0 refills | Status: DC | PRN

## 2023-06-19 NOTE — Patient Instructions (Addendum)
Medication Instructions:  The current medical regimen is effective;  continue present plan and medications as directed. Please refer to the Current Medication list given to you today.  *If you need a refill on your cardiac medications before your next appointment, please call your pharmacy*  Lab Work: NONE If you have labs (blood work) drawn today and your tests are completely normal, you will receive your results only by: MyChart Message (if you have MyChart) OR A paper copy in the mail If you have any lab test that is abnormal or we need to change your treatment, we will call you to review the results.  Testing/Procedures: NONE   Follow-Up: At [facility], you and your health needs are our priority.  As part of our continuing mission to provide you with exceptional heart care, we have created designated Provider Care Teams.  These Care Teams include your primary Cardiologist (physician) and Advanced Practice Providers (APPs -  Physician Assistants and Nurse Practitioners) who all work together to provide you with the care you need, when you need it.  We recommend signing up for the patient portal called "MyChart".  Sign up information is provided on this After Visit Summary.  MyChart is used to connect with patients for Virtual Visits (Telemedicine).  Patients are able to view lab/test results, encounter notes, upcoming appointments, etc.  Non-urgent messages can be sent to your provider as well.   To learn more about what you can do with MyChart, go to [email or web address].    Your next appointment:   ALREADY SCHEDULED   Provider:   [facility] DANIEL  Other Instructions

## 2023-07-02 ENCOUNTER — Encounter (HOSPITAL_COMMUNITY): Payer: Medicare Other | Admitting: Cardiology

## 2023-07-10 DIAGNOSIS — M25552 Pain in left hip: Secondary | ICD-10-CM | POA: Diagnosis not present

## 2023-07-16 NOTE — Progress Notes (Unsigned)
HEART AND VASCULAR CENTER   MULTIDISCIPLINARY HEART VALVE TEAM  Structural Heart Office Note:  .    Date:  07/17/2023  ID:  GILMORE TSUDA, DOB [birth year], MRN [id number] PCP: [name], MD  [facility] Providers Cardiologist:  Marca Ancona, MD  History of Present Illness: Danny [name] is a 87 y.o. male with a history of HLD, 1st deg AV block, peripheral neuropathy, CAD s/p recent PCI to pLAD, and moderate AS who presented to [facility] on 06/11/23 for planned TAVR through the PROGRESS CAP trial and is here today for one month follow up.   Mr. Prevot has been followed by Dr. Shirlee Latch for his cardiology care. He underwent cardiac amyloid evaluation in 2021 which was found to be equivocal for transthyretin amyloid and thought to be negative. Serial echocardiograms have shown a preserved ejection fraction however the development of worsening aortic stenosis and associated severe calcification of the aortic valve. Most recent echocardiogram demonstrated moderate aortic stenosis with an aortic valve area of 1.1 cm2 and mean gradient of 33 mmHg with peak velocity of 3.68 m/s with ejection fraction of 60%. He was referred to the structural heart team and was felt to have symptomatic moderate aortic stenosis and was considered for PROGRESS CAP trial and ultimately met all inclusion criteria to proceed. [facility] 05/02/23 showed normal filling pressures, 95% focal proximal LAD stenosis after 2nd diagonal s/p PCI/DES to mLAD. Pre TAVR CT imaging with anatomy suitable for 26mm S3UR valve placement.    He was then evaluated by the multidisciplinary valve team and felt to have symptomatic moderate aortic stenosis and to be suitable for PROGRESS TRIAL CAP TAVR and is now s/p successful TAVR with a 26 mm Edwards Sapien 3 THV via the TF approach. He was continued on DAPT with ASA 81mg  and Plavix 75mg  daily given recent PCI. Likely to continue for one year then lifelong ASA.    He was  initially doing very well in follow up but seems very anxious today. He reports intermittent episodes of fleeting gas/right sided epigastric pain felt to be due to inactivity. Wife has not been letting him return to golf and therefore the patient has been very sedentary and not "feeling himself". Episodes are sharp and brief with no associated symptoms. EKG today with no acute changes. He denies SOB, palpitations, LE edema, orthopnea, PND, dizziness, or syncope. Denies bleeding in stool or urine.    Physical Exam:   VS:  BP 122/60   Pulse (!) 56   Ht 5\' 10"  (1.778 m)   Wt 166 lb (75.3 kg)   SpO2 97%   BMI 23.82 kg/m    Wt Readings from Last 3 Encounters:  07/17/23 166 lb (75.3 kg)  07/17/23 166 lb (75.3 kg)  06/19/23 163 lb 9.6 oz (74.2 kg)    General: Well developed, well nourished, NAD Lungs:Clear to ausculation bilaterally. No wheezes, rales, or rhonchi. Breathing is unlabored. Cardiovascular: RRR with S1 S2. No murmurs Extremities: No edema.  Neuro: Alert and oriented. No focal deficits. No facial asymmetry. MAE spontaneously. Psych: Responds to questions appropriately with normal affect.    ASSESSMENT AND PLAN: .    Moderate AS: Patient doing well with NYHA class I symptoms s/p successful TAVR with a 26 mm Edwards Sapien 3 THV via the TF approach on 06/11/23 through the PROGRESS CAP Trial. CCS grade I. Echo today with normal LVEF at 60-65%, mild MR, stable 26mm S3UR TAVR valve with a  mean gradient at , peak 17.38mmHg, and AVA by VTI at 1.21cm2 with trivial PVL. Continue DAPT with ASA 81mg  and Plavix 75mg  daily given recent PCI. He will require lifelong dental SBE with Amoxicillin. Plan one year follow up with repeat echo and Dr. Shirlee Latch in the interm.    1st AV block: Stable today with no changes. Continue on Toprol.    CAD: Pre-TAVR cath showed a high-grade mid LAD lesion treated with OCT guided PCI with cutting balloon angioplasty and drug-eluting stent x 1 on 05/02/23. Continue  DAPT with ASA 81mg  and Plavix 75mg  daily. Continue Crestor 5mg  daily. Toprol 25mg  daily.   Epigastric pain: Unclear etiology as above. If persistent after restarting activities, encouraged to reach out about further recommendations. Continue Protonix. Encouraged OTC antiacids with food log.  Dilated ascending aorta: Noted to have dilated aorta measuring 40mm. Continue with surveillance imaging and stable BP control.    Signed, Georgie Chard, NP

## 2023-07-17 ENCOUNTER — Ambulatory Visit (INDEPENDENT_AMBULATORY_CARE_PROVIDER_SITE_OTHER): Payer: Medicare Other

## 2023-07-17 ENCOUNTER — Ambulatory Visit: Payer: Medicare Other | Attending: Cardiology

## 2023-07-17 ENCOUNTER — Other Ambulatory Visit (HOSPITAL_COMMUNITY): Payer: Medicare Other

## 2023-07-17 VITALS — BP 122/60 | HR 56 | Ht 70.0 in | Wt 166.0 lb

## 2023-07-17 VITALS — BP 122/60 | HR 56 | Resp 16 | Wt 166.0 lb

## 2023-07-17 DIAGNOSIS — I350 Nonrheumatic aortic (valve) stenosis: Secondary | ICD-10-CM | POA: Diagnosis not present

## 2023-07-17 DIAGNOSIS — Z9861 Coronary angioplasty status: Secondary | ICD-10-CM | POA: Diagnosis not present

## 2023-07-17 DIAGNOSIS — I10 Essential (primary) hypertension: Secondary | ICD-10-CM | POA: Insufficient documentation

## 2023-07-17 DIAGNOSIS — Z952 Presence of prosthetic heart valve: Secondary | ICD-10-CM | POA: Diagnosis not present

## 2023-07-17 DIAGNOSIS — R072 Precordial pain: Secondary | ICD-10-CM

## 2023-07-17 DIAGNOSIS — I77810 Thoracic aortic ectasia: Secondary | ICD-10-CM | POA: Insufficient documentation

## 2023-07-17 DIAGNOSIS — Z006 Encounter for examination for normal comparison and control in clinical research program: Secondary | ICD-10-CM

## 2023-07-17 DIAGNOSIS — I5032 Chronic diastolic (congestive) heart failure: Secondary | ICD-10-CM

## 2023-07-17 DIAGNOSIS — I2510 Atherosclerotic heart disease of native coronary artery without angina pectoris: Secondary | ICD-10-CM | POA: Diagnosis not present

## 2023-07-17 LAB — ECHOCARDIOGRAM COMPLETE
AR max vel: 1.12 cm2
AV Area VTI: 1.21 cm2
AV Area mean vel: 1.22 cm2
AV Mean grad: 8 mm[Hg]
AV Peak grad: 17.8 mm[Hg]
Ao pk vel: 2.11 m/s
Area-P 1/2: 2.52 cm2
P 1/2 time: 374 ms
S' Lateral: 2.8 cm

## 2023-07-17 MED ORDER — AMOXICILLIN 500 MG PO CAPS: 2000.0000 mg | ORAL_CAPSULE | Freq: Once | ORAL | 4 refills | Status: DC | PRN

## 2023-07-17 NOTE — Research (Addendum)
30 Day Post TAVR   Current Outpatient Medications:    aspirin EC 81 MG tablet, Take 81 mg by mouth in the morning., Disp: , Rfl:    betamethasone dipropionate 0.05 % cream, Apply 0.5 application  topically 2 (two) times daily as needed (dry/irritated skin)., Disp: , Rfl:    Calcium-Magnesium (CAL-MAG PO), Take 1 tablet by mouth in the morning. With Potassium 500 mg /500 mg /99 mg, Disp: , Rfl:    cholecalciferol (VITAMIN D3) 25 MCG (1000 UNIT) tablet, Take 1,000 Units by mouth daily., Disp: , Rfl:    clopidogrel (PLAVIX) 75 MG tablet, Take 1 tablet (75 mg total) by mouth daily with breakfast., Disp: 90 tablet, Rfl: 3   Coenzyme Q10 (CO Q 10 PO), Take 400 mg by mouth in the morning., Disp: , Rfl:    finasteride (PROSCAR) 5 MG tablet, Take 5 mg by mouth in the morning., Disp: , Rfl:    gabapentin (NEURONTIN) 100 MG capsule, Take 1 capsule (100 mg total) by mouth daily as needed., Disp: 30 capsule, Rfl: 5   gabapentin (NEURONTIN) 600 MG tablet, TAKE 1 TABLET AT 4PM AND TAKE 1 TABLET AT BEDTIME., Disp: 180 tablet, Rfl: 3   Magnesium Oxide 400 MG CAPS, Take 1 capsule (400 mg total) by mouth daily., Disp: 30 capsule, Rfl: 11   metoprolol succinate (TOPROL XL) 25 MG 24 hr tablet, Take 1 tablet (25 mg total) by mouth daily., Disp: 30 tablet, Rfl: 3   Multiple Vitamin (MULTIVITAMIN WITH MINERALS) TABS tablet, Take 1 tablet by mouth daily. Centrum Silver, Disp: , Rfl:    Multiple Vitamins-Minerals (PRESERVISION AREDS 2) CAPS, Take 1 capsule by mouth in the morning and at bedtime., Disp: , Rfl:    nitroGLYCERIN (NITROSTAT) 0.4 MG SL tablet, ONE TABLET UNDER TONGUE WHEN NEEDED FOR CHEST PAIN. MAY REPEAT IN 5 MINUTES., Disp: 25 tablet, Rfl: 6   pantoprazole (PROTONIX) 40 MG tablet, Take 1 tablet (40 mg total) by mouth daily., Disp: 90 tablet, Rfl: 3   Polyvinyl Alcohol-Povidone PF (REFRESH) 1.4-0.6 % SOLN, Place 1-2 drops into both eyes 3 (three) times daily as needed (dry/irritated eyes.)., Disp: , Rfl:     Probiotic Product (PROBIOTIC-10 PO), Take 1 capsule by mouth in the morning., Disp: , Rfl:    RESTASIS 0.05 % ophthalmic emulsion, Place 1 drop into both eyes 2 (two) times daily as needed (dry/irritated eyes.)., Disp: , Rfl:    rosuvastatin (CRESTOR) 5 MG tablet, TAKE 1 TABLET ONCE DAILY., Disp: 90 tablet, Rfl: 0   Saw Palmetto 160 MG CAPS, Take 160 mg by mouth 2 (two) times daily., Disp: , Rfl:    tamsulosin (FLOMAX) 0.4 MG CAPS capsule, Take 0.4 mg by mouth in the morning and at bedtime., Disp: , Rfl:    traMADol (ULTRAM) 50 MG tablet, Take 50 mg by mouth 3 (three) times daily as needed (back pain.)., Disp: , Rfl:    traZODone (DESYREL) 50 MG tablet, Take 25 mg by mouth at bedtime as needed for sleep., Disp: , Rfl:    Turmeric (QC TUMERIC COMPLEX) 500 MG CAPS, Take 500 mg by mouth in the morning., Disp: , Rfl:    zolpidem (AMBIEN) 10 MG tablet, Take 10 mg by mouth at bedtime., Disp: , Rfl:    amoxicillin (AMOXIL) 500 MG capsule, Take 4 capsules (2,000 mg total) by mouth once as needed for up to 1 dose. 1 HOUR PRIOR TO DENTAL CLEANINGS, Disp: 4 capsule, Rfl: 4   furosemide (LASIX)  20 MG tablet, Take 1 tablet (20 mg total) by mouth as needed (For weight gain of 3 lbs in 24 hours or 5 lbs in a week). For weight gain of 3 lbs in 24 hours or 5 lbs in a week, Disp: 30 tablet, Rfl: 0     Physical Exam:   VS:  BP 122/60   Pulse (!) 56   Ht 5\' 10"  (1.778 m)   Wt 166 lb (75.3 kg)   SpO2 97%   BMI   NHYA CLASS: 1   CCS CLASS: 1  Transthoracic echocardiogram (TTE): 07/17/2023   12- lead electrocardiogram:     Labs:  See lab results   KCCQ: See worksheet     [id number]: See worksheet     [id number]:  See worksheet

## 2023-07-17 NOTE — Patient Instructions (Signed)
Medication Instructions:   *If you need a refill on your cardiac medications before your next appointment, please call your pharmacy*   Lab Work:  If you have labs (blood work) drawn today and your tests are completely normal, you will receive your results only by: MyChart Message (if you have MyChart) OR A paper copy in the mail If you have any lab test that is abnormal or we need to change your treatment, we will call you to review the results.   Testing/Procedures:    Follow-Up: At [facility], you and your health needs are our priority.  As part of our continuing mission to provide you with exceptional heart care, we have created designated Provider Care Teams.  These Care Teams include your primary Cardiologist (physician) and Advanced Practice Providers (APPs -  Physician Assistants and Nurse Practitioners) who all work together to provide you with the care you need, when you need it.  We recommend signing up for the patient portal called "MyChart".  Sign up information is provided on this After Visit Summary.  MyChart is used to connect with patients for Virtual Visits (Telemedicine).  Patients are able to view lab/test results, encounter notes, upcoming appointments, etc.  Non-urgent messages can be sent to your provider as well.   To learn more about what you can do with MyChart, go to [email or web address].    

## 2023-07-18 ENCOUNTER — Encounter (HOSPITAL_COMMUNITY): Payer: Self-pay

## 2023-07-18 ENCOUNTER — Telehealth (HOSPITAL_COMMUNITY): Payer: Self-pay

## 2023-07-18 NOTE — Telephone Encounter (Signed)
Pt's wife called to get pt scheduled for cardiac rehab. Pt will come in for orientation on 11/5@1030  and will attend the 145 exercise class.   Sent package

## 2023-07-19 LAB — CBC
Hematocrit: 41.3 % (ref 37.5–51.0)
Hemoglobin: 13.2 g/dL (ref 13.0–17.7)
MCH: 30 pg (ref 26.6–33.0)
MCHC: 32 g/dL (ref 31.5–35.7)
MCV: 94 fL (ref 79–97)
Platelets: 135 10*3/uL — ABNORMAL LOW (ref 150–450)
RBC: 4.4 x10E6/uL (ref 4.14–5.80)
RDW: 12.4 % (ref 11.6–15.4)
WBC: 8.6 10*3/uL (ref 3.4–10.8)

## 2023-07-19 LAB — BASIC METABOLIC PANEL
BUN/Creatinine Ratio: 18 (ref 10–24)
BUN: 16 mg/dL (ref 8–27)
CO2: 23 mmol/L (ref 20–29)
Calcium: 9.5 mg/dL (ref 8.6–10.2)
Chloride: 104 mmol/L (ref 96–106)
Creatinine, Ser: 0.88 mg/dL (ref 0.76–1.27)
Glucose: 111 mg/dL — ABNORMAL HIGH (ref 70–99)
Potassium: 4.6 mmol/L (ref 3.5–5.2)
Sodium: 140 mmol/L (ref 134–144)
eGFR: 84 mL/min/{1.73_m2} (ref >59)

## 2023-07-23 ENCOUNTER — Encounter (HOSPITAL_COMMUNITY)
Admission: RE | Admit: 2023-07-23 | Discharge: 2023-07-23 | Disposition: A | Discharge status: Home / Self Care | Payer: Medicare Other | Source: Ambulatory Visit | Attending: Cardiology | Admitting: Cardiology

## 2023-07-23 VITALS — BP 114/70 | HR 60 | Ht 70.0 in | Wt 170.4 lb

## 2023-07-23 DIAGNOSIS — Z952 Presence of prosthetic heart valve: Secondary | ICD-10-CM | POA: Insufficient documentation

## 2023-07-23 DIAGNOSIS — Z955 Presence of coronary angioplasty implant and graft: Secondary | ICD-10-CM | POA: Insufficient documentation

## 2023-07-23 NOTE — Progress Notes (Signed)
Cardiac Individual Treatment Plan  Patient Details  Name: Danny [name] MRN: [id number] Date of Birth: 1935/11/23 Referring Provider:   Flowsheet Row INTENSIVE CARDIAC REHAB ORIENT from 07/23/2023 in [facility]  Referring Provider Marca Ancona, MD       Initial Encounter Date:  Flowsheet Row INTENSIVE CARDIAC REHAB ORIENT from 07/23/2023 in [facility], [facility], & Lung [facility]  Date 07/23/23       Visit Diagnosis: 06/11/23 S/P TAVR (transcatheter aortic valve replacement)  Patient's Home Medications on Admission:  Current Outpatient Medications:    amoxicillin (AMOXIL) 500 MG capsule, Take 4 capsules (2,000 mg total) by mouth once as needed for up to 1 dose. 1 HOUR PRIOR TO DENTAL CLEANINGS, Disp: 4 capsule, Rfl: 4   aspirin EC 81 MG tablet, Take 81 mg by mouth in the morning., Disp: , Rfl:    betamethasone dipropionate 0.05 % cream, Apply 0.5 application  topically 2 (two) times daily as needed (dry/irritated skin)., Disp: , Rfl:    Calcium-Magnesium (CAL-MAG PO), Take 1 tablet by mouth in the morning. With Potassium 500 mg /500 mg /99 mg, Disp: , Rfl:    cholecalciferol (VITAMIN D3) 25 MCG (1000 UNIT) tablet, Take 1,000 Units by mouth daily., Disp: , Rfl:    clopidogrel (PLAVIX) 75 MG tablet, Take 1 tablet (75 mg total) by mouth daily with breakfast., Disp: 90 tablet, Rfl: 3   Coenzyme Q10 (CO Q 10 PO), Take 400 mg by mouth in the morning., Disp: , Rfl:    finasteride (PROSCAR) 5 MG tablet, Take 5 mg by mouth in the morning., Disp: , Rfl:    furosemide (LASIX) 20 MG tablet, Take 1 tablet (20 mg total) by mouth as needed (For weight gain of 3 lbs in 24 hours or 5 lbs in a week). For weight gain of 3 lbs in 24 hours or 5 lbs in a week, Disp: 30 tablet, Rfl: 0   gabapentin (NEURONTIN) 100 MG capsule, Take 1 capsule (100 mg total) by mouth daily as needed., Disp: 30 capsule, Rfl: 5    gabapentin (NEURONTIN) 600 MG tablet, TAKE 1 TABLET AT 4PM AND TAKE 1 TABLET AT BEDTIME., Disp: 180 tablet, Rfl: 3   Magnesium Oxide 400 MG CAPS, Take 1 capsule (400 mg total) by mouth daily., Disp: 30 capsule, Rfl: 11   metoprolol succinate (TOPROL XL) 25 MG 24 hr tablet, Take 1 tablet (25 mg total) by mouth daily., Disp: 30 tablet, Rfl: 3   Multiple Vitamin (MULTIVITAMIN WITH MINERALS) TABS tablet, Take 1 tablet by mouth daily. Centrum Silver, Disp: , Rfl:    Multiple Vitamins-Minerals (PRESERVISION AREDS 2) CAPS, Take 1 capsule by mouth in the morning and at bedtime., Disp: , Rfl:    nitroGLYCERIN (NITROSTAT) 0.4 MG SL tablet, ONE TABLET UNDER TONGUE WHEN NEEDED FOR CHEST PAIN. MAY REPEAT IN 5 MINUTES., Disp: 25 tablet, Rfl: 6   pantoprazole (PROTONIX) 40 MG tablet, Take 1 tablet (40 mg total) by mouth daily., Disp: 90 tablet, Rfl: 3   Polyvinyl Alcohol-Povidone PF (REFRESH) 1.4-0.6 % SOLN, Place 1-2 drops into both eyes 3 (three) times daily as needed (dry/irritated eyes.)., Disp: , Rfl:    Probiotic Product (PROBIOTIC-10 PO), Take 1 capsule by mouth in the morning., Disp: , Rfl:    RESTASIS 0.05 % ophthalmic emulsion, Place 1 drop into both eyes 2 (two) times daily as needed (dry/irritated eyes.)., Disp: , Rfl:  rosuvastatin (CRESTOR) 5 MG tablet, TAKE 1 TABLET ONCE DAILY., Disp: 90 tablet, Rfl: 0   Saw Palmetto 160 MG CAPS, Take 160 mg by mouth 2 (two) times daily., Disp: , Rfl:    tamsulosin (FLOMAX) 0.4 MG CAPS capsule, Take 0.4 mg by mouth in the morning and at bedtime., Disp: , Rfl:    traMADol (ULTRAM) 50 MG tablet, Take 50 mg by mouth 3 (three) times daily as needed (back pain.)., Disp: , Rfl:    traZODone (DESYREL) 50 MG tablet, Take 25 mg by mouth at bedtime as needed for sleep., Disp: , Rfl:    Turmeric (QC TUMERIC COMPLEX) 500 MG CAPS, Take 500 mg by mouth in the morning., Disp: , Rfl:    zolpidem (AMBIEN) 10 MG tablet, Take 10 mg by mouth at bedtime., Disp: , Rfl:   Past Medical  History: Past Medical History:  Diagnosis Date   Allergic rhinitis    Arthritis    BPH (benign prostatic hypertrophy)    Dr. Laverle Patter   Cervical disc disease    Coronary artery disease    Patient developed exertional dyspnea. ETT-myoview (8/12) with 11 METS, normal LV EF, normal perfusion at rest and stress but elevated TID ratio. Coronary CT angiogram in 9/12 showed equivocal significant disease in the CFX. Cath in 10/12 showed EF 55-60%, 30% mid RCA, 40% distal RCA, 40% proximal LAD. ETT-Myoview (10/14): Diaphragmatic attenuation, no ischemia, EF 62%, normal study   Detached vitreous humor    Diverticulosis    GERD (gastroesophageal reflux disease)    Hemorrhoids    Impaired fasting glucose    Ingrown toenail    Lumbar radiculopathy    Numbness and tingling in left arm    Onychomycosis    Rotator cuff disorder    S/P TAVR (transcatheter aortic valve replacement) 06/11/2023   s/p TAVR with a 26 mm Edwards S3UR via the TF approach by Dr. 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    Tobacco Use: Social History   Tobacco Use  Smoking Status Never  Smokeless Tobacco Never    Labs: Review Flowsheet  More data exists      Latest Ref Rng & Units 08/07/2019 08/09/2020 08/15/2021 08/23/2022 05/02/2023  Labs for ITP Cardiac and Pulmonary Rehab  Cholestrol 0 - 200 mg/dL 161  096  045  409  -  LDL (calc) 0 - 99 mg/dL 62  68  45  56  -  HDL-C >40 mg/dL 55  68  60  59  -  Trlycerides <150 mg/dL 44  55  43  73  -  Bicarbonate 20.0 - 28.0 mmol/L - - - - 24.9  24.3   TCO2 22 - 32 mmol/L - - - - 26  26   Acid-base deficit 0.0 - 2.0 mmol/L - - - - 1.0  1.0   O2 Saturation % - - - - 72  69     Details       Multiple values from one day are sorted in reverse-chronological order         Capillary Blood Glucose: No results found for: "GLUCAP"   Exercise Target Goals: Exercise Program Goal: Individual exercise prescription set using results  from initial 6 min walk test and THRR while considering  patient's activity barriers and safety.   Exercise Prescription Goal: Initial exercise prescription builds to 30-45 minutes a day of aerobic activity, 2-3 days per week.  Home exercise guidelines will be given  to patient during program as part of exercise prescription that the participant will acknowledge.  Activity Barriers & Risk Stratification:  Activity Barriers & Cardiac Risk Stratification - 07/23/23 1255       Activity Barriers & Cardiac Risk Stratification   Activity Barriers Balance Concerns;Arthritis;Joint Problems;Back Problems;Deconditioning;Neck/Spine Problems;Other (comment)    Comments occasional lightheadedness upon standing    Cardiac Risk Stratification High             6 Minute Walk:  6 Minute Walk     Row Name 07/23/23 1253         6 Minute Walk   Phase Initial     Distance 1386 feet     Walk Time 6 minutes     # of Rest Breaks 0     MPH 2.63     METS 2.46     RPE 9     Perceived Dyspnea  0     VO2 Peak 8.32     Symptoms No     Resting HR 60 bpm     Resting BP 114/70     Resting Oxygen Saturation  96 %     Exercise Oxygen Saturation  during 6 min walk 98 %     Max Ex. HR 88 bpm     Max Ex. BP 134/72     2 Minute Post BP 120/70              Oxygen Initial Assessment:   Oxygen Re-Evaluation:   Oxygen Discharge (Final Oxygen Re-Evaluation):   Initial Exercise Prescription:  Initial Exercise Prescription - 07/23/23 1200       Date of Initial Exercise RX and Referring Provider   Date 07/23/23    Referring Provider Marca Ancona, MD    Expected Discharge Date 10/16/23      Recumbant Bike   Level 2    RPM 50    Watts 25    Minutes 15    METs 2.4      Arm Ergometer   Level 1.5    Watts 20    RPM 50    Minutes 15    METs 2.4      Prescription Details   Frequency (times per week) 3    Duration Progress to 30 minutes of continuous aerobic without signs/symptoms of  physical distress      Intensity   THRR 40-80% of Max Heartrate 54-108    Ratings of Perceived Exertion 11-13    Perceived Dyspnea 0-4      Progression   Progression Continue progressive overload as per policy without signs/symptoms or physical distress.      Resistance Training   Training Prescription Yes    Weight 3 lbs    Reps 10-15             Perform Capillary Blood Glucose checks as needed.  Exercise Prescription Changes:   Exercise Comments:   Exercise Goals and Review:   Exercise Goals     Row Name 07/23/23 1256             Exercise Goals   Increase Physical Activity Yes       Intervention Provide advice, education, support and counseling about physical activity/exercise needs.;Develop an individualized exercise prescription for aerobic and resistive training based on initial evaluation findings, risk stratification, comorbidities and participant's personal goals.       Expected Outcomes Short Term: Attend rehab on a regular basis to increase amount of physical activity.;Long Term: Exercising  regularly at least 3-5 days a week.;Long Term: Add in home exercise to make exercise part of routine and to increase amount of physical activity.       Increase Strength and Stamina Yes       Intervention Provide advice, education, support and counseling about physical activity/exercise needs.;Develop an individualized exercise prescription for aerobic and resistive training based on initial evaluation findings, risk stratification, comorbidities and participant's personal goals.       Expected Outcomes Short Term: Increase workloads from initial exercise prescription for resistance, speed, and METs.;Short Term: Perform resistance training exercises routinely during rehab and add in resistance training at home;Long Term: Improve cardiorespiratory fitness, muscular endurance and strength as measured by increased METs and functional capacity ( )       Able to understand and  use rate of perceived exertion (RPE) scale Yes       Intervention Provide education and explanation on how to use RPE scale       Expected Outcomes Short Term: Able to use RPE daily in rehab to express subjective intensity level;Long Term:  Able to use RPE to guide intensity level when exercising independently       Knowledge and understanding of Target Heart Rate Range (THRR) Yes       Intervention Provide education and explanation of THRR including how the numbers were predicted and where they are located for reference       Expected Outcomes Short Term: Able to state/look up THRR;Long Term: Able to use THRR to govern intensity when exercising independently;Short Term: Able to use daily as guideline for intensity in rehab       Understanding of Exercise Prescription Yes       Intervention Provide education, explanation, and written materials on patient's individual exercise prescription       Expected Outcomes Short Term: Able to explain program exercise prescription;Long Term: Able to explain home exercise prescription to exercise independently                Exercise Goals Re-Evaluation :   Discharge Exercise Prescription (Final Exercise Prescription Changes):   Nutrition:  Target Goals: Understanding of nutrition guidelines, daily intake of sodium 1500mg , cholesterol 200mg , calories 30% from fat and 7% or less from saturated fats, daily to have 5 or more servings of fruits and vegetables.  Biometrics:  Pre Biometrics - 07/23/23 1100       Pre Biometrics   Waist Circumference 43 inches    Hip Circumference 38 inches    Waist to Hip Ratio 1.13 %    Triceps Skinfold 7 mm    % Body Fat 25.2 %    Grip Strength 20 kg    Flexibility --   Not performed due to back/hip issues   Single Leg Stand 8.18 seconds              Nutrition Therapy Plan and Nutrition Goals:   Nutrition Assessments:  MEDIFICTS Score Key: >=70 Need to make dietary changes  40-70 Heart Healthy  Diet <= 40 Therapeutic Level Cholesterol Diet    Picture Your Plate Scores: <51 Unhealthy dietary pattern with much room for improvement. 41-50 Dietary pattern unlikely to meet recommendations for good health and room for improvement. 51-60 More healthful dietary pattern, with some room for improvement.  >60 Healthy dietary pattern, although there may be some specific behaviors that could be improved.    Nutrition Goals Re-Evaluation:   Nutrition Goals Re-Evaluation:   Nutrition Goals Discharge (Final Nutrition Goals Re-Evaluation):  Psychosocial: Target Goals: Acknowledge presence or absence of significant depression and/or stress, maximize coping skills, provide positive support system. Participant is able to verbalize types and ability to use techniques and skills needed for reducing stress and depression.  Initial Review & Psychosocial Screening:  Initial Psych Review & Screening - 07/23/23 1249       Initial Review   Current issues with Current Sleep Concerns      Family Dynamics   Good Support System? Yes   Pt has sposue for support     Barriers   Psychosocial barriers to participate in program There are no identifiable barriers or psychosocial needs.      Screening Interventions   Interventions Encouraged to exercise             Quality of Life Scores:  Quality of Life - 07/23/23 1251       Quality of Life   Select Quality of Life      Quality of Life Scores   Health/Function Pre 28.1 %    Socioeconomic Pre 30 %    Psych/Spiritual Pre 30 %    Family Pre 30 %    GLOBAL Pre 29.14 %            Scores of 19 and below usually indicate a poorer quality of life in these areas.  A difference of  2-3 points is a clinically meaningful difference.  A difference of 2-3 points in the total score of the Quality of Life Index has been associated with significant improvement in overall quality of life, self-image, physical symptoms, and general health in  studies assessing change in quality of life.  PHQ-9: Review Flowsheet       07/23/2023 02/04/2020  Depression screen PHQ 2/9  Decreased Interest 0 0  Down, Depressed, Hopeless 0 0  PHQ - 2 Score 0 0  Altered sleeping 1 -  Tired, decreased energy 1 -  Change in appetite 0 -  Feeling bad or failure about yourself  0 -  Trouble concentrating 0 -  Moving slowly or fidgety/restless 0 -  Suicidal thoughts 0 -  PHQ-9 Score 2 -  Difficult doing work/chores Not difficult at all -    Details           Interpretation of Total Score  Total Score Depression Severity:  1-4 = Minimal depression, 5-9 = Mild depression, 10-14 = Moderate depression, 15-19 = Moderately severe depression, 20-27 = Severe depression   Psychosocial Evaluation and Intervention:   Psychosocial Re-Evaluation:   Psychosocial Discharge (Final Psychosocial Re-Evaluation):   Vocational Rehabilitation: Provide vocational rehab assistance to qualifying candidates.   Vocational Rehab Evaluation & Intervention:  Vocational Rehab - 07/23/23 1250       Initial Vocational Rehab Evaluation & Intervention   Assessment shows need for Vocational Rehabilitation No   Pt is retired            Education: Education Goals: Education classes will be provided on a weekly basis, covering required topics. Participant will state understanding/return demonstration of topics presented.     Core Videos: Exercise    Move It!  Clinical staff conducted group or individual video education with verbal and written material and guidebook.  Patient learns the recommended Pritikin exercise program. Exercise with the goal of living a long, healthy life. Some of the health benefits of exercise include controlled diabetes, healthier blood pressure levels, improved cholesterol levels, improved heart and lung capacity, improved sleep, and better body composition. Everyone should speak  with their doctor before starting or changing an  exercise routine.  Biomechanical Limitations Clinical staff conducted group or individual video education with verbal and written material and guidebook.  Patient learns how biomechanical limitations can impact exercise and how we can mitigate and possibly overcome limitations to have an impactful and balanced exercise routine.  Body Composition Clinical staff conducted group or individual video education with verbal and written material and guidebook.  Patient learns that body composition (ratio of muscle mass to fat mass) is a key component to assessing overall fitness, rather than body weight alone. Increased fat mass, especially visceral belly fat, can put [place] at increased risk for metabolic syndrome, type 2 diabetes, heart disease, and even death. It is recommended to combine diet and exercise (cardiovascular and resistance training) to improve your body composition. Seek guidance from your physician and exercise physiologist before implementing an exercise routine.  Exercise Action Plan Clinical staff conducted group or individual video education with verbal and written material and guidebook.  Patient learns the recommended strategies to achieve and enjoy long-term exercise adherence, including variety, self-motivation, self-efficacy, and positive decision making. Benefits of exercise include fitness, good health, weight management, more energy, better sleep, less stress, and overall well-being.  Medical   Heart Disease Risk Reduction Clinical staff conducted group or individual video education with verbal and written material and guidebook.  Patient learns our heart is our most vital organ as it circulates oxygen, nutrients, white blood cells, and hormones throughout the entire body, and carries waste away. Data supports a plant-based eating plan like the Pritikin Program for its effectiveness in slowing progression of and reversing heart disease. The video provides a number of  recommendations to address heart disease.   Metabolic Syndrome and Belly Fat  Clinical staff conducted group or individual video education with verbal and written material and guidebook.  Patient learns what metabolic syndrome is, how it leads to heart disease, and how one can reverse it and keep it from coming back. You have metabolic syndrome if you have 3 of the following 5 criteria: abdominal obesity, high blood pressure, high triglycerides, low HDL cholesterol, and high blood sugar.  Hypertension and Heart Disease Clinical staff conducted group or individual video education with verbal and written material and guidebook.  Patient learns that high blood pressure, or hypertension, is very common in the [place]. Hypertension is largely due to excessive salt intake, but other important risk factors include being overweight, physical inactivity, drinking too much alcohol, smoking, and not eating enough potassium from fruits and vegetables. High blood pressure is a leading risk factor for heart attack, stroke, congestive heart failure, dementia, kidney failure, and premature death. Long-term effects of excessive salt intake include stiffening of the arteries and thickening of heart muscle and organ damage. Recommendations include ways to reduce hypertension and the risk of heart disease.  Diseases of Our Time - Focusing on Diabetes Clinical staff conducted group or individual video education with verbal and written material and guidebook.  Patient learns why the best way to stop diseases of our time is prevention, through food and other lifestyle changes. Medicine (such as prescription pills and surgeries) is often only a Band-Aid on the problem, not a long-term solution. Most common diseases of our time include obesity, type 2 diabetes, hypertension, heart disease, and cancer. The Pritikin Program is recommended and has been proven to help reduce, reverse, and/or prevent the damaging effects of  metabolic syndrome.  Nutrition   Overview of the Pritikin  Eating Plan  Clinical staff conducted group or individual video education with verbal and written material and guidebook.  Patient learns about the Pritikin Eating Plan for disease risk reduction. The Pritikin Eating Plan emphasizes a wide variety of unrefined, minimally-processed carbohydrates, like fruits, vegetables, whole grains, and legumes. Go, Caution, and Stop food choices are explained. Plant-based and lean animal proteins are emphasized. Rationale provided for low sodium intake for blood pressure control, low added sugars for blood sugar stabilization, and low added fats and oils for coronary artery disease risk reduction and weight management.  Calorie Density  Clinical staff conducted group or individual video education with verbal and written material and guidebook.  Patient learns about calorie density and how it impacts the Pritikin Eating Plan. Knowing the characteristics of the food you choose will help you decide whether those foods will lead to weight gain or weight loss, and whether you want to consume more or less of them. Weight loss is usually a side effect of the Pritikin Eating Plan because of its focus on low calorie-dense foods.  Label Reading  Clinical staff conducted group or individual video education with verbal and written material and guidebook.  Patient learns about the Pritikin recommended label reading guidelines and corresponding recommendations regarding calorie density, added sugars, sodium content, and whole grains.  Dining Out - Part 1  Clinical staff conducted group or individual video education with verbal and written material and guidebook.  Patient learns that restaurant meals can be sabotaging because they can be so high in calories, fat, sodium, and/or sugar. Patient learns recommended strategies on how to positively address this and avoid unhealthy pitfalls.  Facts on Fats  Clinical staff  conducted group or individual video education with verbal and written material and guidebook.  Patient learns that lifestyle modifications can be just as effective, if not more so, as many medications for lowering your risk of heart disease. A Pritikin lifestyle can help to reduce your risk of inflammation and atherosclerosis (cholesterol build-up, or plaque, in the artery walls). Lifestyle interventions such as dietary choices and physical activity address the cause of atherosclerosis. A review of the types of fats and their impact on blood cholesterol levels, along with dietary recommendations to reduce fat intake is also included.  Nutrition Action Plan  Clinical staff conducted group or individual video education with verbal and written material and guidebook.  Patient learns how to incorporate Pritikin recommendations into their lifestyle. Recommendations include planning and keeping personal health goals in mind as an important part of their success.  Healthy Mind-Set    Healthy Minds, Bodies, Hearts  Clinical staff conducted group or individual video education with verbal and written material and guidebook.  Patient learns how to identify when they are stressed. Video will discuss the impact of that stress, as well as the many benefits of stress management. Patient will also be introduced to stress management techniques. The way we think, act, and feel has an impact on our hearts.  How Our Thoughts Can Heal Our Hearts  Clinical staff conducted group or individual video education with verbal and written material and guidebook.  Patient learns that negative thoughts can cause depression and anxiety. This can result in negative lifestyle behavior and serious health problems. Cognitive behavioral therapy is an effective method to help control our thoughts in order to change and improve our emotional outlook.  Additional Videos:  Exercise    Improving Performance  Clinical staff conducted group  or individual video education with verbal  and written material and guidebook.  Patient learns to use a non-linear approach by alternating intensity levels and lengths of time spent exercising to help burn more calories and lose more body fat. Cardiovascular exercise helps improve heart health, metabolism, hormonal balance, blood sugar control, and recovery from fatigue. Resistance training improves strength, endurance, balance, coordination, reaction time, metabolism, and muscle mass. Flexibility exercise improves circulation, posture, and balance. Seek guidance from your physician and exercise physiologist before implementing an exercise routine and learn your capabilities and proper form for all exercise.  Introduction to Yoga  Clinical staff conducted group or individual video education with verbal and written material and guidebook.  Patient learns about yoga, a discipline of the coming together of mind, breath, and body. The benefits of yoga include improved flexibility, improved range of motion, better posture and core strength, increased lung function, weight loss, and positive self-image. Yoga's heart health benefits include lowered blood pressure, healthier heart rate, decreased cholesterol and triglyceride levels, improved immune function, and reduced stress. Seek guidance from your physician and exercise physiologist before implementing an exercise routine and learn your capabilities and proper form for all exercise.  Medical   Aging: Enhancing Your Quality of Life  Clinical staff conducted group or individual video education with verbal and written material and guidebook.  Patient learns key strategies and recommendations to stay in good physical health and enhance quality of life, such as prevention strategies, having an advocate, securing a Health Care Proxy and Power of Attorney, and keeping a list of medications and system for tracking them. It also discusses how to avoid risk for bone  loss.  Biology of Weight Control  Clinical staff conducted group or individual video education with verbal and written material and guidebook.  Patient learns that weight gain occurs because we consume more calories than we burn (eating more, moving less). Even if your body weight is normal, you may have higher ratios of fat compared to muscle mass. Too much body fat puts you at increased risk for cardiovascular disease, heart attack, stroke, type 2 diabetes, and obesity-related cancers. In addition to exercise, following the Pritikin Eating Plan can help reduce your risk.  Decoding Lab Results  Clinical staff conducted group or individual video education with verbal and written material and guidebook.  Patient learns that lab test reflects one measurement whose values change over time and are influenced by many factors, including medication, stress, sleep, exercise, food, hydration, pre-existing medical conditions, and more. It is recommended to use the knowledge from this video to become more involved with your lab results and evaluate your numbers to speak with your doctor.   Diseases of Our Time - Overview  Clinical staff conducted group or individual video education with verbal and written material and guidebook.  Patient learns that according to the CDC, 50% to 70% of chronic diseases (such as obesity, type 2 diabetes, elevated lipids, hypertension, and heart disease) are avoidable through lifestyle improvements including healthier food choices, listening to satiety cues, and increased physical activity.  Sleep Disorders Clinical staff conducted group or individual video education with verbal and written material and guidebook.  Patient learns how good quality and duration of sleep are important to overall health and well-being. Patient also learns about sleep disorders and how they impact health along with recommendations to address them, including discussing with a physician.  Nutrition   Dining Out - Part 2 Clinical staff conducted group or individual video education with verbal and written material and guidebook.  Patient learns how to plan ahead and communicate in order to maximize their dining experience in a healthy and nutritious manner. Included are recommended food choices based on the type of restaurant the patient is visiting.   Fueling a Banker conducted group or individual video education with verbal and written material and guidebook.  There is a strong connection between our food choices and our health. Diseases like obesity and type 2 diabetes are very prevalent and are in large-part due to lifestyle choices. The Pritikin Eating Plan provides plenty of food and hunger-curbing satisfaction. It is easy to follow, affordable, and helps reduce health risks.  Menu Workshop  Clinical staff conducted group or individual video education with verbal and written material and guidebook.  Patient learns that restaurant meals can sabotage health goals because they are often packed with calories, fat, sodium, and sugar. Recommendations include strategies to plan ahead and to communicate with the manager, chef, or server to help order a healthier meal.  Planning Your Eating Strategy  Clinical staff conducted group or individual video education with verbal and written material and guidebook.  Patient learns about the Pritikin Eating Plan and its benefit of reducing the risk of disease. The Pritikin Eating Plan does not focus on calories. Instead, it emphasizes high-quality, nutrient-rich foods. By knowing the characteristics of the foods, we choose, we can determine their calorie density and make informed decisions.  Targeting Your Nutrition Priorities  Clinical staff conducted group or individual video education with verbal and written material and guidebook.  Patient learns that lifestyle habits have a tremendous impact on disease risk and progression. This  video provides eating and physical activity recommendations based on your personal health goals, such as reducing LDL cholesterol, losing weight, preventing or controlling type 2 diabetes, and reducing high blood pressure.  Vitamins and Minerals  Clinical staff conducted group or individual video education with verbal and written material and guidebook.  Patient learns different ways to obtain key vitamins and minerals, including through a recommended healthy diet. It is important to discuss all supplements you take with your doctor.   Healthy Mind-Set    Smoking Cessation  Clinical staff conducted group or individual video education with verbal and written material and guidebook.  Patient learns that cigarette smoking and tobacco addiction pose a serious health risk which affects millions of people. Stopping smoking will significantly reduce the risk of heart disease, lung disease, and many forms of cancer. Recommended strategies for quitting are covered, including working with your doctor to develop a successful plan.  Culinary   Becoming a Set designer conducted group or individual video education with verbal and written material and guidebook.  Patient learns that cooking at home can be healthy, cost-effective, quick, and puts them in control. Keys to cooking healthy recipes will include looking at your recipe, assessing your equipment needs, planning ahead, making it simple, choosing cost-effective seasonal ingredients, and limiting the use of added fats, salts, and sugars.  Cooking - Breakfast and Snacks  Clinical staff conducted group or individual video education with verbal and written material and guidebook.  Patient learns how important breakfast is to satiety and nutrition through the entire day. Recommendations include key foods to eat during breakfast to help stabilize blood sugar levels and to prevent overeating at meals later in the day. Planning ahead is also a  key component.  Cooking - Educational psychologist conducted group or individual video education with verbal  and written material and guidebook.  Patient learns eating strategies to improve overall health, including an approach to cook more at home. Recommendations include thinking of animal protein as a side on your plate rather than center stage and focusing instead on lower calorie dense options like vegetables, fruits, whole grains, and plant-based proteins, such as beans. Making sauces in large quantities to freeze for later and leaving the skin on your vegetables are also recommended to maximize your experience.  Cooking - Healthy Salads and Dressing Clinical staff conducted group or individual video education with verbal and written material and guidebook.  Patient learns that vegetables, fruits, whole grains, and legumes are the foundations of the Pritikin Eating Plan. Recommendations include how to incorporate each of these in flavorful and healthy salads, and how to create homemade salad dressings. Proper handling of ingredients is also covered. Cooking - Soups and [employer] - Soups and Desserts Clinical staff conducted group or individual video education with verbal and written material and guidebook.  Patient learns that Pritikin soups and desserts make for easy, nutritious, and delicious snacks and meal components that are low in sodium, fat, sugar, and calorie density, while high in vitamins, minerals, and filling fiber. Recommendations include simple and healthy ideas for soups and desserts.   Overview     The Pritikin Solution Program Overview Clinical staff conducted group or individual video education with verbal and written material and guidebook.  Patient learns that the results of the Pritikin Program have been documented in more than 100 articles published in peer-reviewed journals, and the benefits include reducing risk factors for (and, in some cases, even  reversing) high cholesterol, high blood pressure, type 2 diabetes, obesity, and more! An overview of the three key pillars of the Pritikin Program will be covered: eating well, doing regular exercise, and having a healthy mind-set.  WORKSHOPS  Exercise: Exercise Basics: Building Your Action Plan Clinical staff led group instruction and group discussion with PowerPoint presentation and patient guidebook. To enhance the learning environment the use of posters, models and videos may be added. At the conclusion of this workshop, patients will comprehend the difference between physical activity and exercise, as well as the benefits of incorporating both, into their routine. Patients will understand the FITT (Frequency, Intensity, Time, and Type) principle and how to use it to build an exercise action plan. In addition, safety concerns and other considerations for exercise and cardiac rehab will be addressed by the presenter. The purpose of this lesson is to promote a comprehensive and effective weekly exercise routine in order to improve patients' overall level of fitness.   Managing Heart Disease: Your Path to a Healthier Heart Clinical staff led group instruction and group discussion with PowerPoint presentation and patient guidebook. To enhance the learning environment the use of posters, models and videos may be added.At the conclusion of this workshop, patients will understand the anatomy and physiology of the heart. Additionally, they will understand how Pritikin's three pillars impact the risk factors, the progression, and the management of heart disease.  The purpose of this lesson is to provide a high-level overview of the heart, heart disease, and how the Pritikin lifestyle positively impacts risk factors.  Exercise Biomechanics Clinical staff led group instruction and group discussion with PowerPoint presentation and patient guidebook. To enhance the learning environment the use of  posters, models and videos may be added. Patients will learn how the structural parts of their bodies function and how these functions impact their daily  activities, movement, and exercise. Patients will learn how to promote a neutral spine, learn how to manage pain, and identify ways to improve their physical movement in order to promote healthy living. The purpose of this lesson is to expose patients to common physical limitations that impact physical activity. Participants will learn practical ways to adapt and manage aches and pains, and to minimize their effect on regular exercise. Patients will learn how to maintain good posture while sitting, walking, and lifting.  Balance Training and Fall Prevention  Clinical staff led group instruction and group discussion with PowerPoint presentation and patient guidebook. To enhance the learning environment the use of posters, models and videos may be added. At the conclusion of this workshop, patients will understand the importance of their sensorimotor skills (vision, proprioception, and the vestibular system) in maintaining their ability to balance as they age. Patients will apply a variety of balancing exercises that are appropriate for their current level of function. Patients will understand the common causes for poor balance, possible solutions to these problems, and ways to modify their physical environment in order to minimize their fall risk. The purpose of this lesson is to teach patients about the importance of maintaining balance as they age and ways to minimize their risk of falling.  WORKSHOPS   Nutrition:  Fueling a Ship broker led group instruction and group discussion with PowerPoint presentation and patient guidebook. To enhance the learning environment the use of posters, models and videos may be added. Patients will review the foundational principles of the Pritikin Eating Plan and understand what constitutes a  serving size in each of the food groups. Patients will also learn Pritikin-friendly foods that are better choices when away from home and review make-ahead meal and snack options. Calorie density will be reviewed and applied to three nutrition priorities: weight maintenance, weight loss, and weight gain. The purpose of this lesson is to reinforce (in a group setting) the key concepts around what patients are recommended to eat and how to apply these guidelines when away from home by planning and selecting Pritikin-friendly options. Patients will understand how calorie density may be adjusted for different weight management goals.  Mindful Eating  Clinical staff led group instruction and group discussion with PowerPoint presentation and patient guidebook. To enhance the learning environment the use of posters, models and videos may be added. Patients will briefly review the concepts of the Pritikin Eating Plan and the importance of low-calorie dense foods. The concept of mindful eating will be introduced as well as the importance of paying attention to internal hunger signals. Triggers for non-hunger eating and techniques for dealing with triggers will be explored. The purpose of this lesson is to provide patients with the opportunity to review the basic principles of the Pritikin Eating Plan, discuss the value of eating mindfully and how to measure internal cues of hunger and fullness using the Hunger Scale. Patients will also discuss reasons for non-hunger eating and learn strategies to use for controlling emotional eating.  Targeting Your Nutrition Priorities Clinical staff led group instruction and group discussion with PowerPoint presentation and patient guidebook. To enhance the learning environment the use of posters, models and videos may be added. Patients will learn how to determine their genetic susceptibility to disease by reviewing their family history. Patients will gain insight into the  importance of diet as part of an overall healthy lifestyle in mitigating the impact of genetics and other environmental insults. The purpose of this lesson is  to provide patients with the opportunity to assess their personal nutrition priorities by looking at their family history, their own health history and current risk factors. Patients will also be able to discuss ways of prioritizing and modifying the Pritikin Eating Plan for their highest risk areas  Menu  Clinical staff led group instruction and group discussion with PowerPoint presentation and patient guidebook. To enhance the learning environment the use of posters, models and videos may be added. Using menus brought in from [employer], or printed from [employer], patients will apply the Pritikin dining out guidelines that were presented in the [employer] video. Patients will also be able to practice these guidelines in a variety of provided scenarios. The purpose of this lesson is to provide patients with the opportunity to practice hands-on learning of the Pritikin Dining Out guidelines with actual menus and practice scenarios.  Label Reading Clinical staff led group instruction and group discussion with PowerPoint presentation and patient guidebook. To enhance the learning environment the use of posters, models and videos may be added. Patients will review and discuss the Pritikin label reading guidelines presented in Pritikin's Label Reading Educational series video. Using fool labels brought in from local grocery stores and markets, patients will apply the label reading guidelines and determine if the packaged food meet the Pritikin guidelines. The purpose of this lesson is to provide patients with the opportunity to review, discuss, and practice hands-on learning of the Pritikin Label Reading guidelines with actual packaged food labels. Cooking School  Pritikin's [employer] are designed to teach  patients ways to prepare quick, simple, and affordable recipes at home. The importance of nutrition's role in chronic disease risk reduction is reflected in its emphasis in the overall Pritikin program. By learning how to prepare essential core Pritikin Eating Plan recipes, patients will increase control over what they eat; be able to customize the flavor of foods without the use of added salt, sugar, or fat; and improve the quality of the food they consume. By learning a set of core recipes which are easily assembled, quickly prepared, and affordable, patients are more likely to prepare more healthy foods at home. These workshops focus on convenient breakfasts, simple entres, side dishes, and desserts which can be prepared with minimal effort and are consistent with nutrition recommendations for cardiovascular risk reduction. Cooking [employer] are taught by a Armed forces logistics/support/administrative officer (RD) who has been trained by the [employer]. The chef or RD has a clear understanding of the importance of minimizing - if not completely eliminating - added fat, sugar, and sodium in recipes. Throughout the series of Cooking School Workshop sessions, patients will learn about healthy ingredients and efficient methods of cooking to build confidence in their capability to prepare    Cooking School weekly topics:  Adding Flavor- Sodium-Free  Fast and Healthy Breakfasts  Powerhouse Plant-Based Proteins  Satisfying Salads and Dressings  Simple Sides and Sauces  International Cuisine-Spotlight on the [employer] Zones  Delicious Desserts  Savory Soups  [employer] - Meals in a Astronomer Appetizers and Snacks  Comforting Weekend Breakfasts  One-Pot Wonders   Fast Evening Meals  Landscape architect Your Pritikin Plate  WORKSHOPS   Healthy Mindset (Psychosocial):  Focused Goals, Sustainable Changes Clinical staff led group instruction and group discussion with PowerPoint  presentation and patient guidebook. To enhance the learning environment the use of posters, models and videos may be added. Patients will be able  to apply effective goal setting strategies to establish at least one personal goal, and then take consistent, meaningful action toward that goal. They will learn to identify common barriers to achieving personal goals and develop strategies to overcome them. Patients will also gain an understanding of how our mind-set can impact our ability to achieve goals and the importance of cultivating a positive and growth-oriented mind-set. The purpose of this lesson is to provide patients with a deeper understanding of how to set and achieve personal goals, as well as the tools and strategies needed to overcome common obstacles which may arise along the way.  From Head to Heart: The Power of a Healthy Outlook  Clinical staff led group instruction and group discussion with PowerPoint presentation and patient guidebook. To enhance the learning environment the use of posters, models and videos may be added. Patients will be able to recognize and describe the impact of emotions and mood on physical health. They will discover the importance of self-care and explore self-care practices which may work for them. Patients will also learn how to utilize the 4 C's to cultivate a healthier outlook and better manage stress and challenges. The purpose of this lesson is to demonstrate to patients how a healthy outlook is an essential part of maintaining good health, especially as they continue their cardiac rehab journey.  Healthy Sleep for a Healthy Heart Clinical staff led group instruction and group discussion with PowerPoint presentation and patient guidebook. To enhance the learning environment the use of posters, models and videos may be added. At the conclusion of this workshop, patients will be able to demonstrate knowledge of the importance of sleep to overall health, well-being,  and quality of life. They will understand the symptoms of, and treatments for, common sleep disorders. Patients will also be able to identify daytime and nighttime behaviors which impact sleep, and they will be able to apply these tools to help manage sleep-related challenges. The purpose of this lesson is to provide patients with a general overview of sleep and outline the importance of quality sleep. Patients will learn about a few of the most common sleep disorders. Patients will also be introduced to the concept of "sleep hygiene," and discover ways to self-manage certain sleeping problems through simple daily behavior changes. Finally, the workshop will motivate patients by clarifying the links between quality sleep and their goals of heart-healthy living.   Recognizing and Reducing Stress Clinical staff led group instruction and group discussion with PowerPoint presentation and patient guidebook. To enhance the learning environment the use of posters, models and videos may be added. At the conclusion of this workshop, patients will be able to understand the types of stress reactions, differentiate between acute and chronic stress, and recognize the impact that chronic stress has on their health. They will also be able to apply different coping mechanisms, such as reframing negative self-talk. Patients will have the opportunity to practice a variety of stress management techniques, such as deep abdominal breathing, progressive muscle relaxation, and/or guided imagery.  The purpose of this lesson is to educate patients on the role of stress in their lives and to provide healthy techniques for coping with it.  Learning Barriers/Preferences:  Learning Barriers/Preferences - 07/23/23 1250       Learning Barriers/Preferences   Learning Barriers Sight;Hearing   pt wears glasses and bilateral hearing aids   Learning Preferences Audio;Computer/Internet;Group Instruction;Individual  Instruction;Pictoral;Skilled Demonstration;Verbal Instruction;Video;Written Material             Education  Topics:  Knowledge Questionnaire Score:  Knowledge Questionnaire Score - 07/23/23 1252       Knowledge Questionnaire Score   Pre Score 20/24             Core Components/Risk Factors/Patient Goals at Admission:  Personal Goals and Risk Factors at Admission - 07/23/23 1250       Core Components/Risk Factors/Patient Goals on Admission   Hypertension Yes    Intervention Provide education on lifestyle modifcations including regular physical activity/exercise, weight management, moderate sodium restriction and increased consumption of fresh fruit, vegetables, and low fat dairy, alcohol moderation, and smoking cessation.;Monitor prescription use compliance.    Expected Outcomes Short Term: Continued assessment and intervention until BP is < 140/83mm HG in hypertensive participants. < 130/24mm HG in hypertensive participants with diabetes, heart failure or chronic kidney disease.;Long Term: Maintenance of blood pressure at goal levels.    Lipids Yes    Intervention Provide education and support for participant on nutrition & aerobic/resistive exercise along with prescribed medications to achieve LDL 70mg , HDL >40mg .    Expected Outcomes Short Term: Participant states understanding of desired cholesterol values and is compliant with medications prescribed. Participant is following exercise prescription and nutrition guidelines.;Long Term: Cholesterol controlled with medications as prescribed, with individualized exercise RX and with personalized nutrition plan. Value goals: LDL < 70mg , HDL > 40 mg.             Core Components/Risk Factors/Patient Goals Review:    Core Components/Risk Factors/Patient Goals at Discharge (Final Review):    ITP Comments:   Comments: Participant attended orientation for the cardiac rehabilitation program on  07/23/2023  to perform initial  intake and exercise walk test. Patient introduced to the [facility] education and orientation packet was reviewed. Completed 6-minute walk test, measurements, initial ITP, and exercise prescription. Vital signs stable. Telemetry-normal sinus rhythm, 1st degree AVB, asymptomatic.   Service time was from 10:15 to 12:25.

## 2023-07-23 NOTE — Progress Notes (Signed)
Cardiac Rehab Medication Review   Does the patient  feel that his/her medications are working for him/her?  yes  Has the patient been experiencing any side effects to the medications prescribed?  yes  Does the patient measure his/her own blood pressure or blood glucose at home?  yes   Does the patient have any problems obtaining medications due to transportation or finances?   no  Understanding of regimen: excellent Understanding of indications: excellent Potential of compliance: excellent    Comments: Pt checks his blood pressure daily. No questions about his medications.    Lorin Picket 07/23/2023 10:52 AM

## 2023-07-25 DIAGNOSIS — L820 Inflamed seborrheic keratosis: Secondary | ICD-10-CM | POA: Diagnosis not present

## 2023-07-25 DIAGNOSIS — L538 Other specified erythematous conditions: Secondary | ICD-10-CM | POA: Diagnosis not present

## 2023-07-25 DIAGNOSIS — L2989 Other pruritus: Secondary | ICD-10-CM | POA: Diagnosis not present

## 2023-07-25 DIAGNOSIS — L309 Dermatitis, unspecified: Secondary | ICD-10-CM | POA: Diagnosis not present

## 2023-07-25 DIAGNOSIS — Z23 Encounter for immunization: Secondary | ICD-10-CM | POA: Diagnosis not present

## 2023-07-29 ENCOUNTER — Encounter (HOSPITAL_COMMUNITY)
Admission: RE | Admit: 2023-07-29 | Discharge: 2023-07-29 | Disposition: A | Discharge status: Home / Self Care | Payer: Medicare Other | Source: Ambulatory Visit | Attending: Cardiology | Admitting: Cardiology

## 2023-07-29 DIAGNOSIS — Z955 Presence of coronary angioplasty implant and graft: Secondary | ICD-10-CM | POA: Diagnosis not present

## 2023-07-29 DIAGNOSIS — Z952 Presence of prosthetic heart valve: Secondary | ICD-10-CM | POA: Diagnosis not present

## 2023-07-29 NOTE — Progress Notes (Signed)
Daily Session Note  Patient Details  Name: Danny [name] MRN: [id number] Date of Birth: [birth year] Referring Provider:   Flowsheet Row INTENSIVE CARDIAC REHAB ORIENT from 07/23/2023 in [facility]  Referring Provider Marca Ancona, MD       Encounter Date: 07/29/2023  Check In:  Session Check In - 07/29/23 1528       Check-In   Supervising physician immediately available to respond to emergencies [facility] MD immediately available    Physician(s) Joni Reining NP    Location [facility]    Staff Present Lorin Picket, MS, ACSM-CEP, CCRP, Exercise Physiologist;Penda Venturi, RN, BSN;Johnny Hale Bogus, MS, Exercise Physiologist;Jetta Walker BS, ACSM-CEP, Exercise Physiologist    Virtual Visit No    Medication changes reported     No    Fall or balance concerns reported    No    Tobacco Cessation No Change    Warm-up and Cool-down Performed as group-led instruction   CRP2 orientation today   Resistance Training Performed Yes    VAD Patient? No    PAD/SET Patient? No      Pain Assessment   Currently in Pain? No/denies    Pain Score 0-No pain    Multiple Pain Sites No             Capillary Blood Glucose: No results found for this or any previous visit (from the past 24 hour(s)).   Exercise Prescription Changes - 07/29/23 1651       Response to Exercise   Blood Pressure (Admit) 138/74    Blood Pressure (Exercise) 138/78    Blood Pressure (Exit) 128/76    Heart Rate (Admit) 64 bpm    Heart Rate (Exercise) 92 bpm    Heart Rate (Exit) 72 bpm    Rating of Perceived Exertion (Exercise) 10    Perceived Dyspnea (Exercise) 0    Symptoms none    Comments Pt frist day in the [facility] progam    Duration Progress to 30 minutes of  aerobic without signs/symptoms of physical distress    Intensity THRR unchanged      Progression   Progression Continue to progress workloads to maintain intensity  without signs/symptoms of physical distress.    Average METs 2.05      Resistance Training   Training Prescription Yes    Weight 3 lbs    Reps 10-15    Time 10 Minutes      Recumbant Bike   Level 2    RPM 65    Watts 20    Minutes 15    METs 2.3      Arm Ergometer   Level 1.5    Watts 12    RPM 49    Minutes 15    METs 1.8             Social History   Tobacco Use  Smoking Status Never  Smokeless Tobacco Never    Goals Met:  Exercise tolerated well No report of concerns or symptoms today Strength training completed today  Goals Unmet:  Not Applicable  Comments: Pt started cardiac rehab today.  Pt tolerated light exercise without difficulty. VSS, telemetry-Sinus Rhythm, tall t wave, asymptomatic.  Medication list reconciled. Pt denies barriers to medicaiton compliance.  PSYCHOSOCIAL ASSESSMENT:  PHQ-2. Pt exhibits positive coping skills, hopeful outlook with supportive family. No psychosocial needs identified at this time, no psychosocial interventions necessary.    Pt enjoys  golf, reading food and travel.   Pt oriented to exercise equipment and routine.    Understanding verbalized.Thayer Headings RN BSN     Dr. Armanda Magic is Medical Director for Cardiac Rehab at [facility].

## 2023-07-31 ENCOUNTER — Encounter (HOSPITAL_COMMUNITY)
Admission: RE | Admit: 2023-07-31 | Discharge: 2023-07-31 | Disposition: A | Discharge status: Home / Self Care | Payer: Medicare Other | Source: Ambulatory Visit | Attending: Cardiology

## 2023-07-31 DIAGNOSIS — Z955 Presence of coronary angioplasty implant and graft: Secondary | ICD-10-CM | POA: Diagnosis not present

## 2023-07-31 DIAGNOSIS — Z952 Presence of prosthetic heart valve: Secondary | ICD-10-CM | POA: Diagnosis not present

## 2023-08-05 ENCOUNTER — Encounter (HOSPITAL_COMMUNITY)
Admission: RE | Admit: 2023-08-05 | Discharge: 2023-08-05 | Disposition: A | Discharge status: Home / Self Care | Payer: Medicare Other | Source: Ambulatory Visit | Attending: Cardiology | Admitting: Cardiology

## 2023-08-05 DIAGNOSIS — Z955 Presence of coronary angioplasty implant and graft: Secondary | ICD-10-CM | POA: Diagnosis not present

## 2023-08-05 DIAGNOSIS — Z952 Presence of prosthetic heart valve: Secondary | ICD-10-CM | POA: Diagnosis not present

## 2023-08-06 NOTE — Progress Notes (Signed)
Cardiac Individual Treatment Plan  Patient Details  Name: Danny [name] MRN: [id number] Date of Birth: April 29, 1936 Referring Provider:   Flowsheet Row INTENSIVE CARDIAC REHAB ORIENT from 07/23/2023 in [facility]  Referring Provider Marca Ancona, MD       Initial Encounter Date:  Flowsheet Row INTENSIVE CARDIAC REHAB ORIENT from 07/23/2023 in [facility]  Date 07/23/23       Visit Diagnosis: 06/11/23 S/P TAVR (transcatheter aortic valve replacement)  Patient's Home Medications on Admission:  Current Outpatient Medications:    amoxicillin (AMOXIL) 500 MG capsule, Take 4 capsules (2,000 mg total) by mouth once as needed for up to 1 dose. 1 HOUR PRIOR TO DENTAL CLEANINGS, Disp: 4 capsule, Rfl: 4   aspirin EC 81 MG tablet, Take 81 mg by mouth in the morning., Disp: , Rfl:    betamethasone dipropionate 0.05 % cream, Apply 0.5 application  topically 2 (two) times daily as needed (dry/irritated skin)., Disp: , Rfl:    Calcium-Magnesium (CAL-MAG PO), Take 1 tablet by mouth in the morning. With Potassium 500 mg /500 mg /99 mg, Disp: , Rfl:    cholecalciferol (VITAMIN D3) 25 MCG (1000 UNIT) tablet, Take 1,000 Units by mouth daily., Disp: , Rfl:    clopidogrel (PLAVIX) 75 MG tablet, Take 1 tablet (75 mg total) by mouth daily with breakfast., Disp: 90 tablet, Rfl: 3   Coenzyme Q10 (CO Q 10 PO), Take 400 mg by mouth in the morning., Disp: , Rfl:    finasteride (PROSCAR) 5 MG tablet, Take 5 mg by mouth in the morning., Disp: , Rfl:    furosemide (LASIX) 20 MG tablet, Take 1 tablet (20 mg total) by mouth as needed (For weight gain of 3 lbs in 24 hours or 5 lbs in a week). For weight gain of 3 lbs in 24 hours or 5 lbs in a week, Disp: 30 tablet, Rfl: 0   gabapentin (NEURONTIN) 100 MG capsule, Take 1 capsule (100 mg total) by mouth daily as needed., Disp: 30 capsule, Rfl: 5    gabapentin (NEURONTIN) 600 MG tablet, TAKE 1 TABLET AT 4PM AND TAKE 1 TABLET AT BEDTIME., Disp: 180 tablet, Rfl: 3   Magnesium Oxide 400 MG CAPS, Take 1 capsule (400 mg total) by mouth daily., Disp: 30 capsule, Rfl: 11   metoprolol succinate (TOPROL XL) 25 MG 24 hr tablet, Take 1 tablet (25 mg total) by mouth daily., Disp: 30 tablet, Rfl: 3   Multiple Vitamin (MULTIVITAMIN WITH MINERALS) TABS tablet, Take 1 tablet by mouth daily. Centrum Silver, Disp: , Rfl:    Multiple Vitamins-Minerals (PRESERVISION AREDS 2) CAPS, Take 1 capsule by mouth in the morning and at bedtime., Disp: , Rfl:    nitroGLYCERIN (NITROSTAT) 0.4 MG SL tablet, ONE TABLET UNDER TONGUE WHEN NEEDED FOR CHEST PAIN. MAY REPEAT IN 5 MINUTES., Disp: 25 tablet, Rfl: 6   pantoprazole (PROTONIX) 40 MG tablet, Take 1 tablet (40 mg total) by mouth daily., Disp: 90 tablet, Rfl: 3   Polyvinyl Alcohol-Povidone PF (REFRESH) 1.4-0.6 % SOLN, Place 1-2 drops into both eyes 3 (three) times daily as needed (dry/irritated eyes.)., Disp: , Rfl:    Probiotic Product (PROBIOTIC-10 PO), Take 1 capsule by mouth in the morning., Disp: , Rfl:    RESTASIS 0.05 % ophthalmic emulsion, Place 1 drop into both eyes 2 (two) times daily as needed (dry/irritated eyes.)., Disp: , Rfl:  rosuvastatin (CRESTOR) 5 MG tablet, TAKE 1 TABLET ONCE DAILY., Disp: 90 tablet, Rfl: 0   Saw Palmetto 160 MG CAPS, Take 160 mg by mouth 2 (two) times daily., Disp: , Rfl:    tamsulosin (FLOMAX) 0.4 MG CAPS capsule, Take 0.4 mg by mouth in the morning and at bedtime., Disp: , Rfl:    traMADol (ULTRAM) 50 MG tablet, Take 50 mg by mouth 3 (three) times daily as needed (back pain.)., Disp: , Rfl:    traZODone (DESYREL) 50 MG tablet, Take 25 mg by mouth at bedtime as needed for sleep., Disp: , Rfl:    Turmeric (QC TUMERIC COMPLEX) 500 MG CAPS, Take 500 mg by mouth in the morning., Disp: , Rfl:    zolpidem (AMBIEN) 10 MG tablet, Take 10 mg by mouth at bedtime., Disp: , Rfl:   Past Medical  History: Past Medical History:  Diagnosis Date   Allergic rhinitis    Arthritis    BPH (benign prostatic hypertrophy)    Dr. Laverle Patter   Cervical disc disease    Coronary artery disease    Patient developed exertional dyspnea. ETT-myoview (8/12) with 11 METS, normal LV EF, normal perfusion at rest and stress but elevated TID ratio. Coronary CT angiogram in 9/12 showed equivocal significant disease in the CFX. Cath in 10/12 showed EF 55-60%, 30% mid RCA, 40% distal RCA, 40% proximal LAD. ETT-Myoview (10/14): Diaphragmatic attenuation, no ischemia, EF 62%, normal study   Detached vitreous humor    Diverticulosis    GERD (gastroesophageal reflux disease)    Hemorrhoids    Impaired fasting glucose    Ingrown toenail    Lumbar radiculopathy    Numbness and tingling in left arm    Onychomycosis    Rotator cuff disorder    S/P TAVR (transcatheter aortic valve replacement) 06/11/2023   s/p TAVR with a 26 mm Edwards S3UR via the TF approach by Dr. 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    Tobacco Use: Social History   Tobacco Use  Smoking Status Never  Smokeless Tobacco Never    Labs: Review Flowsheet  More data exists      Latest Ref Rng & Units 08/07/2019 08/09/2020 08/15/2021 08/23/2022 05/02/2023  Labs for ITP Cardiac and Pulmonary Rehab  Cholestrol 0 - 200 mg/dL 875  643  329  518  -  LDL (calc) 0 - 99 mg/dL 62  68  45  56  -  HDL-C >40 mg/dL 55  68  60  59  -  Trlycerides <150 mg/dL 44  55  43  73  -  Bicarbonate 20.0 - 28.0 mmol/L - - - - 24.9  24.3   TCO2 22 - 32 mmol/L - - - - 26  26   Acid-base deficit 0.0 - 2.0 mmol/L - - - - 1.0  1.0   O2 Saturation % - - - - 72  69     Details       Multiple values from one day are sorted in reverse-chronological order         Capillary Blood Glucose: No results found for: "GLUCAP"   Exercise Target Goals: Exercise Program Goal: Individual exercise prescription set using results  from initial 6 min walk test and THRR while considering  patient's activity barriers and safety.   Exercise Prescription Goal: Initial exercise prescription builds to 30-45 minutes a day of aerobic activity, 2-3 days per week.  Home exercise guidelines will be given  to patient during program as part of exercise prescription that the participant will acknowledge.  Activity Barriers & Risk Stratification:  Activity Barriers & Cardiac Risk Stratification - 07/23/23 1255       Activity Barriers & Cardiac Risk Stratification   Activity Barriers Balance Concerns;Arthritis;Joint Problems;Back Problems;Deconditioning;Neck/Spine Problems;Other (comment)    Comments occasional lightheadedness upon standing    Cardiac Risk Stratification High             6 Minute Walk:  6 Minute Walk     Row Name 07/23/23 1253         6 Minute Walk   Phase Initial     Distance 1386 feet     Walk Time 6 minutes     # of Rest Breaks 0     MPH 2.63     METS 2.46     RPE 9     Perceived Dyspnea  0     VO2 Peak 8.32     Symptoms No     Resting HR 60 bpm     Resting BP 114/70     Resting Oxygen Saturation  96 %     Exercise Oxygen Saturation  during 6 min walk 98 %     Max Ex. HR 88 bpm     Max Ex. BP 134/72     2 Minute Post BP 120/70              Oxygen Initial Assessment:   Oxygen Re-Evaluation:   Oxygen Discharge (Final Oxygen Re-Evaluation):   Initial Exercise Prescription:  Initial Exercise Prescription - 07/23/23 1200       Date of Initial Exercise RX and Referring Provider   Date 07/23/23    Referring Provider Marca Ancona, MD    Expected Discharge Date 10/16/23      Recumbant Bike   Level 2    RPM 50    Watts 25    Minutes 15    METs 2.4      Arm Ergometer   Level 1.5    Watts 20    RPM 50    Minutes 15    METs 2.4      Prescription Details   Frequency (times per week) 3    Duration Progress to 30 minutes of continuous aerobic without signs/symptoms of  physical distress      Intensity   THRR 40-80% of Max Heartrate 54-108    Ratings of Perceived Exertion 11-13    Perceived Dyspnea 0-4      Progression   Progression Continue progressive overload as per policy without signs/symptoms or physical distress.      Resistance Training   Training Prescription Yes    Weight 3 lbs    Reps 10-15             Perform Capillary Blood Glucose checks as needed.  Exercise Prescription Changes:   Exercise Prescription Changes     Row Name 07/29/23 1651             Response to Exercise   Blood Pressure (Admit) 138/74       Blood Pressure (Exercise) 138/78       Blood Pressure (Exit) 128/76       Heart Rate (Admit) 64 bpm       Heart Rate (Exercise) 92 bpm       Heart Rate (Exit) 72 bpm       Rating of Perceived Exertion (Exercise) 10  Perceived Dyspnea (Exercise) 0       Symptoms none       Comments Pt frist day in the [facility] ICR progam       Duration Progress to 30 minutes of  aerobic without signs/symptoms of physical distress       Intensity THRR unchanged         Progression   Progression Continue to progress workloads to maintain intensity without signs/symptoms of physical distress.       Average METs 2.05         Resistance Training   Training Prescription Yes       Weight 3 lbs       Reps 10-15       Time 10 Minutes         Recumbant Bike   Level 2       RPM 65       Watts 20       Minutes 15       METs 2.3         Arm Ergometer   Level 1.5       Watts 12       RPM 49       Minutes 15       METs 1.8                Exercise Comments:   Exercise Comments     Row Name 07/29/23 1655           Exercise Comments Pt first day in the [facility] ICR program. Pt tolerated exercise well with an average MET level of 2.05. Pt is learning his THRR, RPE and ExRx                Exercise Goals and Review:   Exercise Goals     Row Name 07/23/23 1256             Exercise Goals    Increase Physical Activity Yes       Intervention Provide advice, education, support and counseling about physical activity/exercise needs.;Develop an individualized exercise prescription for aerobic and resistive training based on initial evaluation findings, risk stratification, comorbidities and participant's personal goals.       Expected Outcomes Short Term: Attend rehab on a regular basis to increase amount of physical activity.;Long Term: Exercising regularly at least 3-5 days a week.;Long Term: Add in home exercise to make exercise part of routine and to increase amount of physical activity.       Increase Strength and Stamina Yes       Intervention Provide advice, education, support and counseling about physical activity/exercise needs.;Develop an individualized exercise prescription for aerobic and resistive training based on initial evaluation findings, risk stratification, comorbidities and participant's personal goals.       Expected Outcomes Short Term: Increase workloads from initial exercise prescription for resistance, speed, and METs.;Short Term: Perform resistance training exercises routinely during rehab and add in resistance training at home;Long Term: Improve cardiorespiratory fitness, muscular endurance and strength as measured by increased METs and functional capacity ( )       Able to understand and use rate of perceived exertion (RPE) scale Yes       Intervention Provide education and explanation on how to use RPE scale       Expected Outcomes Short Term: Able to use RPE daily in rehab to express subjective intensity level;Long Term:  Able to use RPE to guide intensity level when exercising independently  Knowledge and understanding of Target Heart Rate Range (THRR) Yes       Intervention Provide education and explanation of THRR including how the numbers were predicted and where they are located for reference       Expected Outcomes Short Term: Able to state/look up  THRR;Long Term: Able to use THRR to govern intensity when exercising independently;Short Term: Able to use daily as guideline for intensity in rehab       Understanding of Exercise Prescription Yes       Intervention Provide education, explanation, and written materials on patient's individual exercise prescription       Expected Outcomes Short Term: Able to explain program exercise prescription;Long Term: Able to explain home exercise prescription to exercise independently                Exercise Goals Re-Evaluation :  Exercise Goals Re-Evaluation     Row Name 07/29/23 1654             Exercise Goal Re-Evaluation   Exercise Goals Review Increase Physical Activity;Understanding of Exercise Prescription;Increase Strength and Stamina;Knowledge and understanding of Target Heart Rate Range (THRR);Able to understand and use rate of perceived exertion (RPE) scale       Comments Pt first day in the [facility] ICR program. Pt tolerated exercise well with an average MET level of 2.05. Pt is learning his THRR, RPE and ExRx       Expected Outcomes Will continue to monitor pt and progress workloads as tolerated without sign or symptom                Discharge Exercise Prescription (Final Exercise Prescription Changes):  Exercise Prescription Changes - 07/29/23 1651       Response to Exercise   Blood Pressure (Admit) 138/74    Blood Pressure (Exercise) 138/78    Blood Pressure (Exit) 128/76    Heart Rate (Admit) 64 bpm    Heart Rate (Exercise) 92 bpm    Heart Rate (Exit) 72 bpm    Rating of Perceived Exertion (Exercise) 10    Perceived Dyspnea (Exercise) 0    Symptoms none    Comments Pt frist day in the [facility] ICR progam    Duration Progress to 30 minutes of  aerobic without signs/symptoms of physical distress    Intensity THRR unchanged      Progression   Progression Continue to progress workloads to maintain intensity without signs/symptoms of physical distress.    Average  METs 2.05      Resistance Training   Training Prescription Yes    Weight 3 lbs    Reps 10-15    Time 10 Minutes      Recumbant Bike   Level 2    RPM 65    Watts 20    Minutes 15    METs 2.3      Arm Ergometer   Level 1.5    Watts 12    RPM 49    Minutes 15    METs 1.8             Nutrition:  Target Goals: Understanding of nutrition guidelines, daily intake of sodium 1500mg , cholesterol 200mg , calories 30% from fat and 7% or less from saturated fats, daily to have 5 or more servings of fruits and vegetables.  Biometrics:  Pre Biometrics - 07/23/23 1100       Pre Biometrics   Waist Circumference 43 inches    Hip Circumference 38 inches  Waist to Hip Ratio 1.13 %    Triceps Skinfold 7 mm    % Body Fat 25.2 %    Grip Strength 20 kg    Flexibility --   Not performed due to back/hip issues   Single Leg Stand 8.18 seconds              Nutrition Therapy Plan and Nutrition Goals:  Nutrition Therapy & Goals - 07/29/23 1603       Nutrition Therapy   Diet Heart Healthy Diet    Drug/Food Interactions Statins/Certain Fruits      Personal Nutrition Goals   Nutrition Goal Patient to identify strategies for reducing cardiovascular risk by attending the [facility] education and nutrition series weekly.    Personal Goal #2 Patient to improve diet quality by using the plate method as a guide for meal planning to include lean protein/plant protein, fruits, vegetables, whole grains, nonfat dairy as part of a well-balanced diet.    Personal Goal #3 Patient to limit sodium intake to 2300mg  per day    Comments Patient with medical history of s/p TAVR, hyperlipidemia, CAD, aortic stenosis, s/p coronary artery stent placement. Ronny's LDL remains well controlled. Patient will benefit from participation in intensive cardiac rehab for nutrition, exercise, and lifestyle modification.      Intervention Plan   Intervention Prescribe, educate and counsel regarding individualized  specific dietary modifications aiming towards targeted core components such as weight, hypertension, lipid management, diabetes, heart failure and other comorbidities.;Nutrition handout(s) given to patient.    Expected Outcomes Short Term Goal: Understand basic principles of dietary content, such as calories, fat, sodium, cholesterol and nutrients.;Long Term Goal: Adherence to prescribed nutrition plan.             Nutrition Assessments:  MEDIFICTS Score Key: >=70 Need to make dietary changes  40-70 Heart Healthy Diet <= 40 Therapeutic Level Cholesterol Diet    Picture Your Plate Scores: <40 Unhealthy dietary pattern with much room for improvement. 41-50 Dietary pattern unlikely to meet recommendations for good health and room for improvement. 51-60 More healthful dietary pattern, with some room for improvement.  >60 Healthy dietary pattern, although there may be some specific behaviors that could be improved.    Nutrition Goals Re-Evaluation:  Nutrition Goals Re-Evaluation     Row Name 07/29/23 1603             Goals   Current Weight 170 lb 6.7 oz (77.3 kg)       Comment lipoproteinA WNL, lipids WNL, lDL 56       Expected Outcome Patient with medical history of s/p TAVR, hyperlipidemia, CAD, aortic stenosis, s/p coronary artery stent placement. Ronny's LDL remains well controlled. Patient will benefit from participation in intensive cardiac rehab for nutrition, exercise, and lifestyle modification.                Nutrition Goals Re-Evaluation:  Nutrition Goals Re-Evaluation     Row Name 07/29/23 1603             Goals   Current Weight 170 lb 6.7 oz (77.3 kg)       Comment lipoproteinA WNL, lipids WNL, lDL 56       Expected Outcome Patient with medical history of s/p TAVR, hyperlipidemia, CAD, aortic stenosis, s/p coronary artery stent placement. Ronny's LDL remains well controlled. Patient will benefit from participation in intensive cardiac rehab for  nutrition, exercise, and lifestyle modification.  Nutrition Goals Discharge (Final Nutrition Goals Re-Evaluation):  Nutrition Goals Re-Evaluation - 07/29/23 1603       Goals   Current Weight 170 lb 6.7 oz (77.3 kg)    Comment lipoproteinA WNL, lipids WNL, lDL 56    Expected Outcome Patient with medical history of s/p TAVR, hyperlipidemia, CAD, aortic stenosis, s/p coronary artery stent placement. Ronny's LDL remains well controlled. Patient will benefit from participation in intensive cardiac rehab for nutrition, exercise, and lifestyle modification.             Psychosocial: Target Goals: Acknowledge presence or absence of significant depression and/or stress, maximize coping skills, provide positive support system. Participant is able to verbalize types and ability to use techniques and skills needed for reducing stress and depression.  Initial Review & Psychosocial Screening:  Initial Psych Review & Screening - 07/23/23 1249       Initial Review   Current issues with Current Sleep Concerns      Family Dynamics   Good Support System? Yes   Pt has sposue for support     Barriers   Psychosocial barriers to participate in program There are no identifiable barriers or psychosocial needs.      Screening Interventions   Interventions Encouraged to exercise             Quality of Life Scores:  Quality of Life - 07/23/23 1251       Quality of Life   Select Quality of Life      Quality of Life Scores   Health/Function Pre 28.1 %    Socioeconomic Pre 30 %    Psych/Spiritual Pre 30 %    Family Pre 30 %    GLOBAL Pre 29.14 %            Scores of 19 and below usually indicate a poorer quality of life in these areas.  A difference of  2-3 points is a clinically meaningful difference.  A difference of 2-3 points in the total score of the Quality of Life Index has been associated with significant improvement in overall quality of life, self-image,  physical symptoms, and general health in studies assessing change in quality of life.  PHQ-9: Review Flowsheet       07/23/2023 02/04/2020  Depression screen PHQ 2/9  Decreased Interest 0 0  Down, Depressed, Hopeless 0 0  PHQ - 2 Score 0 0  Altered sleeping 1 -  Tired, decreased energy 1 -  Change in appetite 0 -  Feeling bad or failure about yourself  0 -  Trouble concentrating 0 -  Moving slowly or fidgety/restless 0 -  Suicidal thoughts 0 -  PHQ-9 Score 2 -  Difficult doing work/chores Not difficult at all -    Details           Interpretation of Total Score  Total Score Depression Severity:  1-4 = Minimal depression, 5-9 = Mild depression, 10-14 = Moderate depression, 15-19 = Moderately severe depression, 20-27 = Severe depression   Psychosocial Evaluation and Intervention:   Psychosocial Re-Evaluation:  Psychosocial Re-Evaluation     Row Name 07/30/23 0818 08/06/23 1637           Psychosocial Re-Evaluation   Current issues with Current Sleep Concerns None Identified      Comments Ronny did not voice any concerns or stressors on his first day of exercise Ronny did not voice any increased concerns or stressors during exercise at cardiac rehab  Interventions Encouraged to attend Cardiac Rehabilitation for the exercise;Relaxation education Stress management education;Encouraged to attend Cardiac Rehabilitation for the exercise;Relaxation education      Continue Psychosocial Services  Follow up required by staff No Follow up required               Psychosocial Discharge (Final Psychosocial Re-Evaluation):  Psychosocial Re-Evaluation - 08/06/23 1637       Psychosocial Re-Evaluation   Current issues with None Identified    Comments Ronny did not voice any increased concerns or stressors during exercise at cardiac rehab    Interventions Stress management education;Encouraged to attend Cardiac Rehabilitation for the exercise;Relaxation education    Continue  Psychosocial Services  No Follow up required             Vocational Rehabilitation: Provide vocational rehab assistance to qualifying candidates.   Vocational Rehab Evaluation & Intervention:  Vocational Rehab - 07/23/23 1250       Initial Vocational Rehab Evaluation & Intervention   Assessment shows need for Vocational Rehabilitation No   Pt is retired            Education: Education Goals: Education classes will be provided on a weekly basis, covering required topics. Participant will state understanding/return demonstration of topics presented.    Education     Row Name 07/29/23 1700     Education   Cardiac Education Topics [facility]   Geographical information systems officer Psychosocial   Psychosocial Workshop From Head to Heart: The Power of a Healthy Outlook   Instruction Review Code 1- Verbalizes Understanding   Class Start Time 1400   Class Stop Time 1450   Class Time Calculation (min) 50 min    Row Name 07/31/23 1500     Education   Cardiac Education Topics [facility]   Customer service manager   Weekly Topic Adding Flavor - Sodium-Free   Instruction Review Code 1- Verbalizes Understanding   Class Start Time 1400   Class Stop Time 1440   Class Time Calculation (min) 40 min    Row Name 08/05/23 1600     Education   Cardiac Education Topics [facility]   Select Workshops     Workshops   Educator Exercise Physiologist   Select Exercise   Exercise Workshop Location manager and Fall Prevention   Instruction Review Code 1- Verbalizes Understanding   Class Start Time 1406   Class Stop Time 1500   Class Time Calculation (min) 54 min            Core Videos: Exercise    Move It!  Clinical staff conducted group or individual video education with verbal and written material and guidebook.  Patient learns the recommended [facility]in exercise program. Exercise with the goal of  living a long, healthy life. Some of the health benefits of exercise include controlled diabetes, healthier blood pressure levels, improved cholesterol levels, improved heart and lung capacity, improved sleep, and better body composition. Everyone should speak with their doctor before starting or changing an exercise routine.  Biomechanical Limitations Clinical staff conducted group or individual video education with verbal and written material and guidebook.  Patient learns how biomechanical limitations can impact exercise and how we can mitigate and possibly overcome limitations to have an impactful and balanced exercise routine.  Body Composition Clinical staff conducted group or individual video education with verbal and written material and guidebook.  Patient learns that body composition (ratio of muscle mass to fat mass) is a key component to assessing overall fitness, rather than body weight alone. Increased fat mass, especially visceral belly fat, can put [place] at increased risk for metabolic syndrome, type 2 diabetes, heart disease, and even death. It is recommended to combine diet and exercise (cardiovascular and resistance training) to improve your body composition. Seek guidance from your physician and exercise physiologist before implementing an exercise routine.  Exercise Action Plan Clinical staff conducted group or individual video education with verbal and written material and guidebook.  Patient learns the recommended strategies to achieve and enjoy long-term exercise adherence, including variety, self-motivation, self-efficacy, and positive decision making. Benefits of exercise include fitness, good health, weight management, more energy, better sleep, less stress, and overall well-being.  Medical   Heart Disease Risk Reduction Clinical staff conducted group or individual video education with verbal and written material and guidebook.  Patient learns our heart is our most vital  organ as it circulates oxygen, nutrients, white blood cells, and hormones throughout the entire body, and carries waste away. Data supports a plant-based eating plan like the Pritikin Program for its effectiveness in slowing progression of and reversing heart disease. The video provides a number of recommendations to address heart disease.   Metabolic Syndrome and Belly Fat  Clinical staff conducted group or individual video education with verbal and written material and guidebook.  Patient learns what metabolic syndrome is, how it leads to heart disease, and how one can reverse it and keep it from coming back. You have metabolic syndrome if you have 3 of the following 5 criteria: abdominal obesity, high blood pressure, high triglycerides, low HDL cholesterol, and high blood sugar.  Hypertension and Heart Disease Clinical staff conducted group or individual video education with verbal and written material and guidebook.  Patient learns that high blood pressure, or hypertension, is very common in the [place]. Hypertension is largely due to excessive salt intake, but other important risk factors include being overweight, physical inactivity, drinking too much alcohol, smoking, and not eating enough potassium from fruits and vegetables. High blood pressure is a leading risk factor for heart attack, stroke, congestive heart failure, dementia, kidney failure, and premature death. Long-term effects of excessive salt intake include stiffening of the arteries and thickening of heart muscle and organ damage. Recommendations include ways to reduce hypertension and the risk of heart disease.  Diseases of Our Time - Focusing on Diabetes Clinical staff conducted group or individual video education with verbal and written material and guidebook.  Patient learns why the best way to stop diseases of our time is prevention, through food and other lifestyle changes. Medicine (such as prescription pills and  surgeries) is often only a Band-Aid on the problem, not a long-term solution. Most common diseases of our time include obesity, type 2 diabetes, hypertension, heart disease, and cancer. The Pritikin Program is recommended and has been proven to help reduce, reverse, and/or prevent the damaging effects of metabolic syndrome.  Nutrition   Overview of the Pritikin Eating Plan  Clinical staff conducted group or individual video education with verbal and written material and guidebook.  Patient learns about the Pritikin Eating Plan for disease risk reduction. The Pritikin Eating Plan emphasizes a wide variety of unrefined, minimally-processed carbohydrates, like fruits, vegetables, whole grains, and legumes. Go, Caution, and Stop food choices are explained. Plant-based and lean animal proteins are emphasized. Rationale provided for low sodium intake for blood pressure control,  low added sugars for blood sugar stabilization, and low added fats and oils for coronary artery disease risk reduction and weight management.  Calorie Density  Clinical staff conducted group or individual video education with verbal and written material and guidebook.  Patient learns about calorie density and how it impacts the Pritikin Eating Plan. Knowing the characteristics of the food you choose will help you decide whether those foods will lead to weight gain or weight loss, and whether you want to consume more or less of them. Weight loss is usually a side effect of the Pritikin Eating Plan because of its focus on low calorie-dense foods.  Label Reading  Clinical staff conducted group or individual video education with verbal and written material and guidebook.  Patient learns about the Pritikin recommended label reading guidelines and corresponding recommendations regarding calorie density, added sugars, sodium content, and whole grains.  Dining Out - Part 1  Clinical staff conducted group or individual video education with  verbal and written material and guidebook.  Patient learns that restaurant meals can be sabotaging because they can be so high in calories, fat, sodium, and/or sugar. Patient learns recommended strategies on how to positively address this and avoid unhealthy pitfalls.  Facts on Fats  Clinical staff conducted group or individual video education with verbal and written material and guidebook.  Patient learns that lifestyle modifications can be just as effective, if not more so, as many medications for lowering your risk of heart disease. A Pritikin lifestyle can help to reduce your risk of inflammation and atherosclerosis (cholesterol build-up, or plaque, in the artery walls). Lifestyle interventions such as dietary choices and physical activity address the cause of atherosclerosis. A review of the types of fats and their impact on blood cholesterol levels, along with dietary recommendations to reduce fat intake is also included.  Nutrition Action Plan  Clinical staff conducted group or individual video education with verbal and written material and guidebook.  Patient learns how to incorporate Pritikin recommendations into their lifestyle. Recommendations include planning and keeping personal health goals in mind as an important part of their success.  Healthy Mind-Set    Healthy Minds, Bodies, Hearts  Clinical staff conducted group or individual video education with verbal and written material and guidebook.  Patient learns how to identify when they are stressed. Video will discuss the impact of that stress, as well as the many benefits of stress management. Patient will also be introduced to stress management techniques. The way we think, act, and feel has an impact on our hearts.  How Our Thoughts Can Heal Our Hearts  Clinical staff conducted group or individual video education with verbal and written material and guidebook.  Patient learns that negative thoughts can cause depression and anxiety.  This can result in negative lifestyle behavior and serious health problems. Cognitive behavioral therapy is an effective method to help control our thoughts in order to change and improve our emotional outlook.  Additional Videos:  Exercise    Improving Performance  Clinical staff conducted group or individual video education with verbal and written material and guidebook.  Patient learns to use a non-linear approach by alternating intensity levels and lengths of time spent exercising to help burn more calories and lose more body fat. Cardiovascular exercise helps improve heart health, metabolism, hormonal balance, blood sugar control, and recovery from fatigue. Resistance training improves strength, endurance, balance, coordination, reaction time, metabolism, and muscle mass. Flexibility exercise improves circulation, posture, and balance. Seek guidance from your physician  and exercise physiologist before implementing an exercise routine and learn your capabilities and proper form for all exercise.  Introduction to Yoga  Clinical staff conducted group or individual video education with verbal and written material and guidebook.  Patient learns about yoga, a discipline of the coming together of mind, breath, and body. The benefits of yoga include improved flexibility, improved range of motion, better posture and core strength, increased lung function, weight loss, and positive self-image. Yoga's heart health benefits include lowered blood pressure, healthier heart rate, decreased cholesterol and triglyceride levels, improved immune function, and reduced stress. Seek guidance from your physician and exercise physiologist before implementing an exercise routine and learn your capabilities and proper form for all exercise.  Medical   Aging: Enhancing Your Quality of Life  Clinical staff conducted group or individual video education with verbal and written material and guidebook.  Patient learns key  strategies and recommendations to stay in good physical health and enhance quality of life, such as prevention strategies, having an advocate, securing a Health Care Proxy and Power of Attorney, and keeping a list of medications and system for tracking them. It also discusses how to avoid risk for bone loss.  Biology of Weight Control  Clinical staff conducted group or individual video education with verbal and written material and guidebook.  Patient learns that weight gain occurs because we consume more calories than we burn (eating more, moving less). Even if your body weight is normal, you may have higher ratios of fat compared to muscle mass. Too much body fat puts you at increased risk for cardiovascular disease, heart attack, stroke, type 2 diabetes, and obesity-related cancers. In addition to exercise, following the Pritikin Eating Plan can help reduce your risk.  Decoding Lab Results  Clinical staff conducted group or individual video education with verbal and written material and guidebook.  Patient learns that lab test reflects one measurement whose values change over time and are influenced by many factors, including medication, stress, sleep, exercise, food, hydration, pre-existing medical conditions, and more. It is recommended to use the knowledge from this video to become more involved with your lab results and evaluate your numbers to speak with your doctor.   Diseases of Our Time - Overview  Clinical staff conducted group or individual video education with verbal and written material and guidebook.  Patient learns that according to the CDC, 50% to 70% of chronic diseases (such as obesity, type 2 diabetes, elevated lipids, hypertension, and heart disease) are avoidable through lifestyle improvements including healthier food choices, listening to satiety cues, and increased physical activity.  Sleep Disorders Clinical staff conducted group or individual video education with verbal and  written material and guidebook.  Patient learns how good quality and duration of sleep are important to overall health and well-being. Patient also learns about sleep disorders and how they impact health along with recommendations to address them, including discussing with a physician.  Nutrition  Dining Out - Part 2 Clinical staff conducted group or individual video education with verbal and written material and guidebook.  Patient learns how to plan ahead and communicate in order to maximize their dining experience in a healthy and nutritious manner. Included are recommended food choices based on the type of restaurant the patient is visiting.   Fueling a Banker conducted group or individual video education with verbal and written material and guidebook.  There is a strong connection between our food choices and our health. Diseases like obesity  and type 2 diabetes are very prevalent and are in large-part due to lifestyle choices. The Pritikin Eating Plan provides plenty of food and hunger-curbing satisfaction. It is easy to follow, affordable, and helps reduce health risks.  Menu Workshop  Clinical staff conducted group or individual video education with verbal and written material and guidebook.  Patient learns that restaurant meals can sabotage health goals because they are often packed with calories, fat, sodium, and sugar. Recommendations include strategies to plan ahead and to communicate with the manager, chef, or server to help order a healthier meal.  Planning Your Eating Strategy  Clinical staff conducted group or individual video education with verbal and written material and guidebook.  Patient learns about the Pritikin Eating Plan and its benefit of reducing the risk of disease. The Pritikin Eating Plan does not focus on calories. Instead, it emphasizes high-quality, nutrient-rich foods. By knowing the characteristics of the foods, we choose, we can determine  their calorie density and make informed decisions.  Targeting Your Nutrition Priorities  Clinical staff conducted group or individual video education with verbal and written material and guidebook.  Patient learns that lifestyle habits have a tremendous impact on disease risk and progression. This video provides eating and physical activity recommendations based on your personal health goals, such as reducing LDL cholesterol, losing weight, preventing or controlling type 2 diabetes, and reducing high blood pressure.  Vitamins and Minerals  Clinical staff conducted group or individual video education with verbal and written material and guidebook.  Patient learns different ways to obtain key vitamins and minerals, including through a recommended healthy diet. It is important to discuss all supplements you take with your doctor.   Healthy Mind-Set    Smoking Cessation  Clinical staff conducted group or individual video education with verbal and written material and guidebook.  Patient learns that cigarette smoking and tobacco addiction pose a serious health risk which affects millions of people. Stopping smoking will significantly reduce the risk of heart disease, lung disease, and many forms of cancer. Recommended strategies for quitting are covered, including working with your doctor to develop a successful plan.  Culinary   Becoming a Set designer conducted group or individual video education with verbal and written material and guidebook.  Patient learns that cooking at home can be healthy, cost-effective, quick, and puts them in control. Keys to cooking healthy recipes will include looking at your recipe, assessing your equipment needs, planning ahead, making it simple, choosing cost-effective seasonal ingredients, and limiting the use of added fats, salts, and sugars.  Cooking - Breakfast and Snacks  Clinical staff conducted group or individual video education with verbal  and written material and guidebook.  Patient learns how important breakfast is to satiety and nutrition through the entire day. Recommendations include key foods to eat during breakfast to help stabilize blood sugar levels and to prevent overeating at meals later in the day. Planning ahead is also a key component.  Cooking - Educational psychologist conducted group or individual video education with verbal and written material and guidebook.  Patient learns eating strategies to improve overall health, including an approach to cook more at home. Recommendations include thinking of animal protein as a side on your plate rather than center stage and focusing instead on lower calorie dense options like vegetables, fruits, whole grains, and plant-based proteins, such as beans. Making sauces in large quantities to freeze for later and leaving the skin on your vegetables are  also recommended to maximize your experience.  Cooking - Healthy Salads and Dressing Clinical staff conducted group or individual video education with verbal and written material and guidebook.  Patient learns that vegetables, fruits, whole grains, and legumes are the foundations of the Pritikin Eating Plan. Recommendations include how to incorporate each of these in flavorful and healthy salads, and how to create homemade salad dressings. Proper handling of ingredients is also covered. Cooking - Soups and [employer] - Soups and Desserts Clinical staff conducted group or individual video education with verbal and written material and guidebook.  Patient learns that Pritikin soups and desserts make for easy, nutritious, and delicious snacks and meal components that are low in sodium, fat, sugar, and calorie density, while high in vitamins, minerals, and filling fiber. Recommendations include simple and healthy ideas for soups and desserts.   Overview     The Pritikin Solution Program Overview Clinical staff conducted  group or individual video education with verbal and written material and guidebook.  Patient learns that the results of the Pritikin Program have been documented in more than 100 articles published in peer-reviewed journals, and the benefits include reducing risk factors for (and, in some cases, even reversing) high cholesterol, high blood pressure, type 2 diabetes, obesity, and more! An overview of the three key pillars of the Pritikin Program will be covered: eating well, doing regular exercise, and having a healthy mind-set.  WORKSHOPS  Exercise: Exercise Basics: Building Your Action Plan Clinical staff led group instruction and group discussion with PowerPoint presentation and patient guidebook. To enhance the learning environment the use of posters, models and videos may be added. At the conclusion of this workshop, patients will comprehend the difference between physical activity and exercise, as well as the benefits of incorporating both, into their routine. Patients will understand the FITT (Frequency, Intensity, Time, and Type) principle and how to use it to build an exercise action plan. In addition, safety concerns and other considerations for exercise and cardiac rehab will be addressed by the presenter. The purpose of this lesson is to promote a comprehensive and effective weekly exercise routine in order to improve patients' overall level of fitness.   Managing Heart Disease: Your Path to a Healthier Heart Clinical staff led group instruction and group discussion with PowerPoint presentation and patient guidebook. To enhance the learning environment the use of posters, models and videos may be added.At the conclusion of this workshop, patients will understand the anatomy and physiology of the heart. Additionally, they will understand how Pritikin's three pillars impact the risk factors, the progression, and the management of heart disease.  The purpose of this lesson is to provide a  high-level overview of the heart, heart disease, and how the Pritikin lifestyle positively impacts risk factors.  Exercise Biomechanics Clinical staff led group instruction and group discussion with PowerPoint presentation and patient guidebook. To enhance the learning environment the use of posters, models and videos may be added. Patients will learn how the structural parts of their bodies function and how these functions impact their daily activities, movement, and exercise. Patients will learn how to promote a neutral spine, learn how to manage pain, and identify ways to improve their physical movement in order to promote healthy living. The purpose of this lesson is to expose patients to common physical limitations that impact physical activity. Participants will learn practical ways to adapt and manage aches and pains, and to minimize their effect on regular exercise. Patients will learn how to  maintain good posture while sitting, walking, and lifting.  Balance Training and Fall Prevention  Clinical staff led group instruction and group discussion with PowerPoint presentation and patient guidebook. To enhance the learning environment the use of posters, models and videos may be added. At the conclusion of this workshop, patients will understand the importance of their sensorimotor skills (vision, proprioception, and the vestibular system) in maintaining their ability to balance as they age. Patients will apply a variety of balancing exercises that are appropriate for their current level of function. Patients will understand the common causes for poor balance, possible solutions to these problems, and ways to modify their physical environment in order to minimize their fall risk. The purpose of this lesson is to teach patients about the importance of maintaining balance as they age and ways to minimize their risk of falling.  WORKSHOPS   Nutrition:  Fueling a Ship broker  led group instruction and group discussion with PowerPoint presentation and patient guidebook. To enhance the learning environment the use of posters, models and videos may be added. Patients will review the foundational principles of the Pritikin Eating Plan and understand what constitutes a serving size in each of the food groups. Patients will also learn Pritikin-friendly foods that are better choices when away from home and review make-ahead meal and snack options. Calorie density will be reviewed and applied to three nutrition priorities: weight maintenance, weight loss, and weight gain. The purpose of this lesson is to reinforce (in a group setting) the key concepts around what patients are recommended to eat and how to apply these guidelines when away from home by planning and selecting Pritikin-friendly options. Patients will understand how calorie density may be adjusted for different weight management goals.  Mindful Eating  Clinical staff led group instruction and group discussion with PowerPoint presentation and patient guidebook. To enhance the learning environment the use of posters, models and videos may be added. Patients will briefly review the concepts of the Pritikin Eating Plan and the importance of low-calorie dense foods. The concept of mindful eating will be introduced as well as the importance of paying attention to internal hunger signals. Triggers for non-hunger eating and techniques for dealing with triggers will be explored. The purpose of this lesson is to provide patients with the opportunity to review the basic principles of the Pritikin Eating Plan, discuss the value of eating mindfully and how to measure internal cues of hunger and fullness using the Hunger Scale. Patients will also discuss reasons for non-hunger eating and learn strategies to use for controlling emotional eating.  Targeting Your Nutrition Priorities Clinical staff led group instruction and group discussion  with PowerPoint presentation and patient guidebook. To enhance the learning environment the use of posters, models and videos may be added. Patients will learn how to determine their genetic susceptibility to disease by reviewing their family history. Patients will gain insight into the importance of diet as part of an overall healthy lifestyle in mitigating the impact of genetics and other environmental insults. The purpose of this lesson is to provide patients with the opportunity to assess their personal nutrition priorities by looking at their family history, their own health history and current risk factors. Patients will also be able to discuss ways of prioritizing and modifying the Pritikin Eating Plan for their highest risk areas  Menu  Clinical staff led group instruction and group discussion with PowerPoint presentation and patient guidebook. To enhance the learning environment the use of posters, models  and videos may be added. Using menus brought in from [employer], or printed from [employer], patients will apply the Pritikin dining out guidelines that were presented in the [employer] video. Patients will also be able to practice these guidelines in a variety of provided scenarios. The purpose of this lesson is to provide patients with the opportunity to practice hands-on learning of the Pritikin Dining Out guidelines with actual menus and practice scenarios.  Label Reading Clinical staff led group instruction and group discussion with PowerPoint presentation and patient guidebook. To enhance the learning environment the use of posters, models and videos may be added. Patients will review and discuss the Pritikin label reading guidelines presented in Pritikin's Label Reading Educational series video. Using fool labels brought in from local grocery stores and markets, patients will apply the label reading guidelines and determine if the packaged food meet the Pritikin  guidelines. The purpose of this lesson is to provide patients with the opportunity to review, discuss, and practice hands-on learning of the Pritikin Label Reading guidelines with actual packaged food labels. Cooking School  Pritikin's [employer] are designed to teach patients ways to prepare quick, simple, and affordable recipes at home. The importance of nutrition's role in chronic disease risk reduction is reflected in its emphasis in the overall Pritikin program. By learning how to prepare essential core Pritikin Eating Plan recipes, patients will increase control over what they eat; be able to customize the flavor of foods without the use of added salt, sugar, or fat; and improve the quality of the food they consume. By learning a set of core recipes which are easily assembled, quickly prepared, and affordable, patients are more likely to prepare more healthy foods at home. These workshops focus on convenient breakfasts, simple entres, side dishes, and desserts which can be prepared with minimal effort and are consistent with nutrition recommendations for cardiovascular risk reduction. Cooking [employer] are taught by a Armed forces logistics/support/administrative officer (RD) who has been trained by the [employer]. The chef or RD has a clear understanding of the importance of minimizing - if not completely eliminating - added fat, sugar, and sodium in recipes. Throughout the series of Cooking School Workshop sessions, patients will learn about healthy ingredients and efficient methods of cooking to build confidence in their capability to prepare    Cooking School weekly topics:  Adding Flavor- Sodium-Free  Fast and Healthy Breakfasts  Powerhouse Plant-Based Proteins  Satisfying Salads and Dressings  Simple Sides and Sauces  International Cuisine-Spotlight on the [employer] Zones  Delicious Desserts  Savory Soups  [employer] - Meals in a Astronomer Appetizers and  Snacks  Comforting Weekend Breakfasts  One-Pot Wonders   Fast Evening Meals  Landscape architect Your Pritikin Plate  WORKSHOPS   Healthy Mindset (Psychosocial):  Focused Goals, Sustainable Changes Clinical staff led group instruction and group discussion with PowerPoint presentation and patient guidebook. To enhance the learning environment the use of posters, models and videos may be added. Patients will be able to apply effective goal setting strategies to establish at least one personal goal, and then take consistent, meaningful action toward that goal. They will learn to identify common barriers to achieving personal goals and develop strategies to overcome them. Patients will also gain an understanding of how our mind-set can impact our ability to achieve goals and the importance of cultivating a positive and growth-oriented mind-set. The purpose of this lesson is to provide  patients with a deeper understanding of how to set and achieve personal goals, as well as the tools and strategies needed to overcome common obstacles which may arise along the way.  From Head to Heart: The Power of a Healthy Outlook  Clinical staff led group instruction and group discussion with PowerPoint presentation and patient guidebook. To enhance the learning environment the use of posters, models and videos may be added. Patients will be able to recognize and describe the impact of emotions and mood on physical health. They will discover the importance of self-care and explore self-care practices which may work for them. Patients will also learn how to utilize the 4 C's to cultivate a healthier outlook and better manage stress and challenges. The purpose of this lesson is to demonstrate to patients how a healthy outlook is an essential part of maintaining good health, especially as they continue their cardiac rehab journey.  Healthy Sleep for a Healthy Heart Clinical staff led group instruction and  group discussion with PowerPoint presentation and patient guidebook. To enhance the learning environment the use of posters, models and videos may be added. At the conclusion of this workshop, patients will be able to demonstrate knowledge of the importance of sleep to overall health, well-being, and quality of life. They will understand the symptoms of, and treatments for, common sleep disorders. Patients will also be able to identify daytime and nighttime behaviors which impact sleep, and they will be able to apply these tools to help manage sleep-related challenges. The purpose of this lesson is to provide patients with a general overview of sleep and outline the importance of quality sleep. Patients will learn about a few of the most common sleep disorders. Patients will also be introduced to the concept of "sleep hygiene," and discover ways to self-manage certain sleeping problems through simple daily behavior changes. Finally, the workshop will motivate patients by clarifying the links between quality sleep and their goals of heart-healthy living.   Recognizing and Reducing Stress Clinical staff led group instruction and group discussion with PowerPoint presentation and patient guidebook. To enhance the learning environment the use of posters, models and videos may be added. At the conclusion of this workshop, patients will be able to understand the types of stress reactions, differentiate between acute and chronic stress, and recognize the impact that chronic stress has on their health. They will also be able to apply different coping mechanisms, such as reframing negative self-talk. Patients will have the opportunity to practice a variety of stress management techniques, such as deep abdominal breathing, progressive muscle relaxation, and/or guided imagery.  The purpose of this lesson is to educate patients on the role of stress in their lives and to provide healthy techniques for coping with  it.  Learning Barriers/Preferences:  Learning Barriers/Preferences - 07/23/23 1250       Learning Barriers/Preferences   Learning Barriers Sight;Hearing   pt wears glasses and bilateral hearing aids   Learning Preferences Audio;Computer/Internet;Group Instruction;Individual Instruction;Pictoral;Skilled Demonstration;Verbal Instruction;Video;Written Material             Education Topics:  Knowledge Questionnaire Score:  Knowledge Questionnaire Score - 07/23/23 1252       Knowledge Questionnaire Score   Pre Score 20/24             Core Components/Risk Factors/Patient Goals at Admission:  Personal Goals and Risk Factors at Admission - 07/23/23 1250       Core Components/Risk Factors/Patient Goals on Admission   Hypertension Yes  Intervention Provide education on lifestyle modifcations including regular physical activity/exercise, weight management, moderate sodium restriction and increased consumption of fresh fruit, vegetables, and low fat dairy, alcohol moderation, and smoking cessation.;Monitor prescription use compliance.    Expected Outcomes Short Term: Continued assessment and intervention until BP is < 140/43mm HG in hypertensive participants. < 130/71mm HG in hypertensive participants with diabetes, heart failure or chronic kidney disease.;Long Term: Maintenance of blood pressure at goal levels.    Lipids Yes    Intervention Provide education and support for participant on nutrition & aerobic/resistive exercise along with prescribed medications to achieve LDL 70mg , HDL >40mg .    Expected Outcomes Short Term: Participant states understanding of desired cholesterol values and is compliant with medications prescribed. Participant is following exercise prescription and nutrition guidelines.;Long Term: Cholesterol controlled with medications as prescribed, with individualized exercise RX and with personalized nutrition plan. Value goals: LDL < 70mg , HDL > 40 mg.              Core Components/Risk Factors/Patient Goals Review:   Goals and Risk Factor Review     Row Name 07/30/23 0819 08/06/23 1638           Core Components/Risk Factors/Patient Goals Review   Personal Goals Review Weight Management/Obesity;Hypertension;Lipids Weight Management/Obesity;Hypertension;Lipids      Review Ronny started cardiac rehab on 07/29/23. Ronny did well with exercise. Vital signs were stable. Ronny started cardiac rehab on 07/29/23. Richarda Overlie is off to a good start to exercise. Vital signs have been  stable.      Expected Outcomes Richarda Overlie will continue to participate in cardiac rehab for exercise nutrition and lifestyle modifications. Richarda Overlie will continue to participate in cardiac rehab for exercise nutrition and lifestyle modifications.               Core Components/Risk Factors/Patient Goals at Discharge (Final Review):   Goals and Risk Factor Review - 08/06/23 1638       Core Components/Risk Factors/Patient Goals Review   Personal Goals Review Weight Management/Obesity;Hypertension;Lipids    Review Ronny started cardiac rehab on 07/29/23. Richarda Overlie is off to a good start to exercise. Vital signs have been  stable.    Expected Outcomes Richarda Overlie will continue to participate in cardiac rehab for exercise nutrition and lifestyle modifications.             ITP Comments:  ITP Comments     Row Name 07/30/23 0816 08/06/23 1636         ITP Comments 30 Day ITP Review. Ronny started cardiac rehab on 07/29/23. Ronny did well with exercise. 30 Day ITP Review. Ronny started cardiac rehab on 07/29/23. Richarda Overlie is off to a good start to exercise.               Comments: See ITP comments.Thayer Headings RN BSN

## 2023-08-07 ENCOUNTER — Encounter (HOSPITAL_COMMUNITY)
Admission: RE | Admit: 2023-08-07 | Discharge: 2023-08-07 | Disposition: A | Discharge status: Home / Self Care | Payer: Medicare Other | Source: Ambulatory Visit | Attending: Cardiology

## 2023-08-07 DIAGNOSIS — Z952 Presence of prosthetic heart valve: Secondary | ICD-10-CM | POA: Diagnosis not present

## 2023-08-07 DIAGNOSIS — Z955 Presence of coronary angioplasty implant and graft: Secondary | ICD-10-CM | POA: Diagnosis not present

## 2023-08-09 NOTE — Research (Signed)
Progress CAP  Subject has met all inclusion for Progress CAP trial at this time.

## 2023-08-12 ENCOUNTER — Other Ambulatory Visit (HOSPITAL_COMMUNITY): Payer: Self-pay | Admitting: Cardiology

## 2023-08-12 ENCOUNTER — Encounter (HOSPITAL_COMMUNITY)
Admission: RE | Admit: 2023-08-12 | Discharge: 2023-08-12 | Disposition: A | Discharge status: Home / Self Care | Payer: Medicare Other | Source: Ambulatory Visit | Attending: Cardiology

## 2023-08-12 DIAGNOSIS — Z952 Presence of prosthetic heart valve: Secondary | ICD-10-CM | POA: Diagnosis not present

## 2023-08-12 DIAGNOSIS — Z955 Presence of coronary angioplasty implant and graft: Secondary | ICD-10-CM | POA: Diagnosis not present

## 2023-08-14 ENCOUNTER — Encounter (HOSPITAL_COMMUNITY)
Admission: RE | Admit: 2023-08-14 | Discharge: 2023-08-14 | Disposition: A | Discharge status: Home / Self Care | Payer: Medicare Other | Source: Ambulatory Visit | Attending: Cardiology | Admitting: Cardiology

## 2023-08-14 DIAGNOSIS — Z952 Presence of prosthetic heart valve: Secondary | ICD-10-CM

## 2023-08-14 DIAGNOSIS — Z955 Presence of coronary angioplasty implant and graft: Secondary | ICD-10-CM | POA: Diagnosis not present

## 2023-08-19 ENCOUNTER — Encounter (HOSPITAL_COMMUNITY)
Admission: RE | Admit: 2023-08-19 | Discharge: 2023-08-19 | Disposition: A | Discharge status: Home / Self Care | Payer: Medicare Other | Source: Ambulatory Visit | Attending: Cardiology | Admitting: Cardiology

## 2023-08-19 DIAGNOSIS — Z952 Presence of prosthetic heart valve: Secondary | ICD-10-CM | POA: Diagnosis not present

## 2023-08-19 DIAGNOSIS — Z955 Presence of coronary angioplasty implant and graft: Secondary | ICD-10-CM | POA: Diagnosis not present

## 2023-08-21 ENCOUNTER — Encounter (HOSPITAL_COMMUNITY)
Admission: RE | Admit: 2023-08-21 | Discharge: 2023-08-21 | Disposition: A | Discharge status: Home / Self Care | Payer: Medicare Other | Source: Ambulatory Visit | Attending: Cardiology | Admitting: Cardiology

## 2023-08-21 DIAGNOSIS — Z952 Presence of prosthetic heart valve: Secondary | ICD-10-CM | POA: Diagnosis not present

## 2023-08-21 DIAGNOSIS — Z955 Presence of coronary angioplasty implant and graft: Secondary | ICD-10-CM | POA: Diagnosis not present

## 2023-08-23 NOTE — Progress Notes (Signed)
Cardiac Individual Treatment Plan  Patient Details  Name: Danny [name] MRN: [id number] Date of Birth: 1936/06/07 Referring Provider:   Flowsheet Row INTENSIVE CARDIAC REHAB ORIENT from 07/23/2023 in [facility]  Referring Provider Marca Ancona, MD       Initial Encounter Date:  Flowsheet Row INTENSIVE CARDIAC REHAB ORIENT from 07/23/2023 in [facility]  Date 07/23/23       Visit Diagnosis: 06/11/23 S/P TAVR (transcatheter aortic valve replacement)  Patient's Home Medications on Admission:  Current Outpatient Medications:    amoxicillin (AMOXIL) 500 MG capsule, Take 4 capsules (2,000 mg total) by mouth once as needed for up to 1 dose. 1 HOUR PRIOR TO DENTAL CLEANINGS, Disp: 4 capsule, Rfl: 4   aspirin EC 81 MG tablet, Take 81 mg by mouth in the morning., Disp: , Rfl:    betamethasone dipropionate 0.05 % cream, Apply 0.5 application  topically 2 (two) times daily as needed (dry/irritated skin)., Disp: , Rfl:    Calcium-Magnesium (CAL-MAG PO), Take 1 tablet by mouth in the morning. With Potassium 500 mg /500 mg /99 mg, Disp: , Rfl:    cholecalciferol (VITAMIN D3) 25 MCG (1000 UNIT) tablet, Take 1,000 Units by mouth daily., Disp: , Rfl:    clopidogrel (PLAVIX) 75 MG tablet, Take 1 tablet (75 mg total) by mouth daily with breakfast., Disp: 90 tablet, Rfl: 3   Coenzyme Q10 (CO Q 10 PO), Take 400 mg by mouth in the morning., Disp: , Rfl:    finasteride (PROSCAR) 5 MG tablet, Take 5 mg by mouth in the morning., Disp: , Rfl:    furosemide (LASIX) 20 MG tablet, Take 1 tablet (20 mg total) by mouth as needed (For weight gain of 3 lbs in 24 hours or 5 lbs in a week). For weight gain of 3 lbs in 24 hours or 5 lbs in a week, Disp: 30 tablet, Rfl: 0   gabapentin (NEURONTIN) 100 MG capsule, Take 1 capsule (100 mg total) by mouth daily as needed., Disp: 30 capsule, Rfl: 5    gabapentin (NEURONTIN) 600 MG tablet, TAKE 1 TABLET AT 4PM AND TAKE 1 TABLET AT BEDTIME., Disp: 180 tablet, Rfl: 3   Magnesium Oxide 400 MG CAPS, Take 1 capsule (400 mg total) by mouth daily., Disp: 30 capsule, Rfl: 11   metoprolol succinate (TOPROL-XL) 25 MG 24 hr tablet, TAKE ONE TABLET BY MOUTH DAILY, Disp: 30 tablet, Rfl: 3   Multiple Vitamin (MULTIVITAMIN WITH MINERALS) TABS tablet, Take 1 tablet by mouth daily. Centrum Silver, Disp: , Rfl:    Multiple Vitamins-Minerals (PRESERVISION AREDS 2) CAPS, Take 1 capsule by mouth in the morning and at bedtime., Disp: , Rfl:    nitroGLYCERIN (NITROSTAT) 0.4 MG SL tablet, ONE TABLET UNDER TONGUE WHEN NEEDED FOR CHEST PAIN. MAY REPEAT IN 5 MINUTES., Disp: 25 tablet, Rfl: 6   pantoprazole (PROTONIX) 40 MG tablet, Take 1 tablet (40 mg total) by mouth daily., Disp: 90 tablet, Rfl: 3   Polyvinyl Alcohol-Povidone PF (REFRESH) 1.4-0.6 % SOLN, Place 1-2 drops into both eyes 3 (three) times daily as needed (dry/irritated eyes.)., Disp: , Rfl:    Probiotic Product (PROBIOTIC-10 PO), Take 1 capsule by mouth in the morning., Disp: , Rfl:    RESTASIS 0.05 % ophthalmic emulsion, Place 1 drop into both eyes 2 (two) times daily as needed (dry/irritated eyes.)., Disp: , Rfl:    rosuvastatin (CRESTOR) 5 MG  tablet, TAKE 1 TABLET ONCE DAILY., Disp: 90 tablet, Rfl: 0   Saw Palmetto 160 MG CAPS, Take 160 mg by mouth 2 (two) times daily., Disp: , Rfl:    tamsulosin (FLOMAX) 0.4 MG CAPS capsule, Take 0.4 mg by mouth in the morning and at bedtime., Disp: , Rfl:    traMADol (ULTRAM) 50 MG tablet, Take 50 mg by mouth 3 (three) times daily as needed (back pain.)., Disp: , Rfl:    traZODone (DESYREL) 50 MG tablet, Take 25 mg by mouth at bedtime as needed for sleep., Disp: , Rfl:    Turmeric (QC TUMERIC COMPLEX) 500 MG CAPS, Take 500 mg by mouth in the morning., Disp: , Rfl:    zolpidem (AMBIEN) 10 MG tablet, Take 10 mg by mouth at bedtime., Disp: , Rfl:   Past Medical  History: Past Medical History:  Diagnosis Date   Allergic rhinitis    Arthritis    BPH (benign prostatic hypertrophy)    Dr. Laverle Patter   Cervical disc disease    Coronary artery disease    Patient developed exertional dyspnea. ETT-myoview (8/12) with 11 METS, normal LV EF, normal perfusion at rest and stress but elevated TID ratio. Coronary CT angiogram in 9/12 showed equivocal significant disease in the CFX. Cath in 10/12 showed EF 55-60%, 30% mid RCA, 40% distal RCA, 40% proximal LAD. ETT-Myoview (10/14): Diaphragmatic attenuation, no ischemia, EF 62%, normal study   Detached vitreous humor    Diverticulosis    GERD (gastroesophageal reflux disease)    Hemorrhoids    Impaired fasting glucose    Ingrown toenail    Lumbar radiculopathy    Numbness and tingling in left arm    Onychomycosis    Rotator cuff disorder    S/P TAVR (transcatheter aortic valve replacement) 06/11/2023   s/p TAVR with a 26 mm Edwards S3UR via the TF approach by Dr. 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    Tobacco Use: Social History   Tobacco Use  Smoking Status Never  Smokeless Tobacco Never    Labs: Review Flowsheet  More data exists      Latest Ref Rng & Units 08/07/2019 08/09/2020 08/15/2021 08/23/2022 05/02/2023  Labs for ITP Cardiac and Pulmonary Rehab  Cholestrol 0 - 200 mg/dL 951  884  166  063  -  LDL (calc) 0 - 99 mg/dL 62  68  45  56  -  HDL-C >40 mg/dL 55  68  60  59  -  Trlycerides <150 mg/dL 44  55  43  73  -  Bicarbonate 20.0 - 28.0 mmol/L - - - - 24.9  24.3   TCO2 22 - 32 mmol/L - - - - 26  26   Acid-base deficit 0.0 - 2.0 mmol/L - - - - 1.0  1.0   O2 Saturation % - - - - 72  69     Details       Multiple values from one day are sorted in reverse-chronological order         Capillary Blood Glucose: No results found for: "GLUCAP"   Exercise Target Goals: Exercise Program Goal: Individual exercise prescription set using results  from initial 6 min walk test and THRR while considering  patient's activity barriers and safety.   Exercise Prescription Goal: Initial exercise prescription builds to 30-45 minutes a day of aerobic activity, 2-3 days per week.  Home exercise guidelines will be given to patient during program  as part of exercise prescription that the participant will acknowledge.  Activity Barriers & Risk Stratification:  Activity Barriers & Cardiac Risk Stratification - 07/23/23 1255       Activity Barriers & Cardiac Risk Stratification   Activity Barriers Balance Concerns;Arthritis;Joint Problems;Back Problems;Deconditioning;Neck/Spine Problems;Other (comment)    Comments occasional lightheadedness upon standing    Cardiac Risk Stratification High             6 Minute Walk:  6 Minute Walk     Row Name 07/23/23 1253         6 Minute Walk   Phase Initial     Distance 1386 feet     Walk Time 6 minutes     # of Rest Breaks 0     MPH 2.63     METS 2.46     RPE 9     Perceived Dyspnea  0     VO2 Peak 8.32     Symptoms No     Resting HR 60 bpm     Resting BP 114/70     Resting Oxygen Saturation  96 %     Exercise Oxygen Saturation  during 6 min walk 98 %     Max Ex. HR 88 bpm     Max Ex. BP 134/72     2 Minute Post BP 120/70              Oxygen Initial Assessment:   Oxygen Re-Evaluation:   Oxygen Discharge (Final Oxygen Re-Evaluation):   Initial Exercise Prescription:  Initial Exercise Prescription - 07/23/23 1200       Date of Initial Exercise RX and Referring Provider   Date 07/23/23    Referring Provider Marca Ancona, MD    Expected Discharge Date 10/16/23      Recumbant Bike   Level 2    RPM 50    Watts 25    Minutes 15    METs 2.4      Arm Ergometer   Level 1.5    Watts 20    RPM 50    Minutes 15    METs 2.4      Prescription Details   Frequency (times per week) 3    Duration Progress to 30 minutes of continuous aerobic without signs/symptoms of  physical distress      Intensity   THRR 40-80% of Max Heartrate 54-108    Ratings of Perceived Exertion 11-13    Perceived Dyspnea 0-4      Progression   Progression Continue progressive overload as per policy without signs/symptoms or physical distress.      Resistance Training   Training Prescription Yes    Weight 3 lbs    Reps 10-15             Perform Capillary Blood Glucose checks as needed.  Exercise Prescription Changes:   Exercise Prescription Changes     Row Name 07/29/23 1651 08/14/23 1620           Response to Exercise   Blood Pressure (Admit) 138/74 120/82      Blood Pressure (Exercise) 138/78 128/72      Blood Pressure (Exit) 128/76 128/78      Heart Rate (Admit) 64 bpm 64 bpm      Heart Rate (Exercise) 92 bpm 81 bpm      Heart Rate (Exit) 72 bpm 66 bpm      Rating of Perceived Exertion (Exercise) 10 11  Perceived Dyspnea (Exercise) 0 0      Symptoms none none      Comments Pt frist day in the [facility] ICR progam Reviewed MET's and goals      Duration Progress to 30 minutes of  aerobic without signs/symptoms of physical distress Progress to 30 minutes of  aerobic without signs/symptoms of physical distress      Intensity THRR unchanged THRR unchanged        Progression   Progression Continue to progress workloads to maintain intensity without signs/symptoms of physical distress. Continue to progress workloads to maintain intensity without signs/symptoms of physical distress.      Average METs 2.05 2.1        Resistance Training   Training Prescription Yes No      Weight 3 lbs 3 lbs      Reps 10-15 10-15      Time 10 Minutes 10 Minutes        Recumbant Bike   Level 2 2      RPM 65 72      Watts 20 20      Minutes 15 15      METs 2.3 2.3        Arm Ergometer   Level 1.5 1.5      Watts 12 --      RPM 49 --      Minutes 15 15      METs 1.8 1.9               Exercise Comments:   Exercise Comments     Row Name 07/29/23 1655  08/14/23 1626         Exercise Comments Pt first day in the [facility] ICR program. Pt tolerated exercise well with an average MET level of 2.05. Pt is learning his THRR, RPE and ExRx Reviewed MET's and goals. Pt tolerated exercise well with an average MET level of 2.1. Pt is feeling gopod about his goals and is already increasing stamina. He is continuing to work on his balance and when he feels more confident will add in golfing and home ExRx. Until then encouraged balance training in the Prit[facility]in book. Pt is also having issues with sleep, talked over resources to help with this.               Exercise Goals and Review:   Exercise Goals     Row Name 07/23/23 1256             Exercise Goals   Increase Physical Activity Yes       Intervention Provide advice, education, support and counseling about physical activity/exercise needs.;Develop an individualized exercise prescription for aerobic and resistive training based on initial evaluation findings, risk stratification, comorbidities and participant's personal goals.       Expected Outcomes Short Term: Attend rehab on a regular basis to increase amount of physical activity.;Long Term: Exercising regularly at least 3-5 days a week.;Long Term: Add in home exercise to make exercise part of routine and to increase amount of physical activity.       Increase Strength and Stamina Yes       Intervention Provide advice, education, support and counseling about physical activity/exercise needs.;Develop an individualized exercise prescription for aerobic and resistive training based on initial evaluation findings, risk stratification, comorbidities and participant's personal goals.       Expected Outcomes Short Term: Increase workloads from initial exercise prescription for resistance, speed, and METs.;Short Term: Perform resistance training  exercises routinely during rehab and add in resistance training at home;Long Term: Improve cardiorespiratory  fitness, muscular endurance and strength as measured by increased METs and functional capacity ( )       Able to understand and use rate of perceived exertion (RPE) scale Yes       Intervention Provide education and explanation on how to use RPE scale       Expected Outcomes Short Term: Able to use RPE daily in rehab to express subjective intensity level;Long Term:  Able to use RPE to guide intensity level when exercising independently       Knowledge and understanding of Target Heart Rate Range (THRR) Yes       Intervention Provide education and explanation of THRR including how the numbers were predicted and where they are located for reference       Expected Outcomes Short Term: Able to state/look up THRR;Long Term: Able to use THRR to govern intensity when exercising independently;Short Term: Able to use daily as guideline for intensity in rehab       Understanding of Exercise Prescription Yes       Intervention Provide education, explanation, and written materials on patient's individual exercise prescription       Expected Outcomes Short Term: Able to explain program exercise prescription;Long Term: Able to explain home exercise prescription to exercise independently                Exercise Goals Re-Evaluation :  Exercise Goals Re-Evaluation     Row Name 07/29/23 1654 08/14/23 1623           Exercise Goal Re-Evaluation   Exercise Goals Review Increase Physical Activity;Understanding of Exercise Prescription;Increase Strength and Stamina;Knowledge and understanding of Target Heart Rate Range (THRR);Able to understand and use rate of perceived exertion (RPE) scale Increase Physical Activity;Understanding of Exercise Prescription;Increase Strength and Stamina;Knowledge and understanding of Target Heart Rate Range (THRR);Able to understand and use rate of perceived exertion (RPE) scale      Comments Pt first day in the [facility] ICR program. Pt tolerated exercise well with an average  MET level of 2.05. Pt is learning his THRR, RPE and ExRx Reviewed MET's and goals. Pt tolerated exercise well with an average MET level of 2.1. Pt is feeling gopod about his goals and is already increasing stamina. He is continuing to work on his balance and when he feels more confident will add in golfing and home ExRx. Until then encouraged balance training in the Pritikin book. Pt is also having issues with sleep, talked over resources to help with this.      Expected Outcomes Will continue to monitor pt and progress workloads as tolerated without sign or symptom Will continue to monitor pt and progress workloads as tolerated without sign or symptom               Discharge Exercise Prescription (Final Exercise Prescription Changes):  Exercise Prescription Changes - 08/14/23 1620       Response to Exercise   Blood Pressure (Admit) 120/82    Blood Pressure (Exercise) 128/72    Blood Pressure (Exit) 128/78    Heart Rate (Admit) 64 bpm    Heart Rate (Exercise) 81 bpm    Heart Rate (Exit) 66 bpm    Rating of Perceived Exertion (Exercise) 11    Perceived Dyspnea (Exercise) 0    Symptoms none    Comments Reviewed MET's and goals    Duration Progress to 30 minutes of  aerobic without  signs/symptoms of physical distress    Intensity THRR unchanged      Progression   Progression Continue to progress workloads to maintain intensity without signs/symptoms of physical distress.    Average METs 2.1      Resistance Training   Training Prescription No    Weight 3 lbs    Reps 10-15    Time 10 Minutes      Recumbant Bike   Level 2    RPM 72    Watts 20    Minutes 15    METs 2.3      Arm Ergometer   Level 1.5    Minutes 15    METs 1.9             Nutrition:  Target Goals: Understanding of nutrition guidelines, daily intake of sodium 1500mg , cholesterol 200mg , calories 30% from fat and 7% or less from saturated fats, daily to have 5 or more servings of fruits and  vegetables.  Biometrics:  Pre Biometrics - 07/23/23 1100       Pre Biometrics   Waist Circumference 43 inches    Hip Circumference 38 inches    Waist to Hip Ratio 1.13 %    Triceps Skinfold 7 mm    % Body Fat 25.2 %    Grip Strength 20 kg    Flexibility --   Not performed due to back/hip issues   Single Leg Stand 8.18 seconds              Nutrition Therapy Plan and Nutrition Goals:  Nutrition Therapy & Goals - 07/29/23 1603       Nutrition Therapy   Diet Heart Healthy Diet    Drug/Food Interactions Statins/Certain Fruits      Personal Nutrition Goals   Nutrition Goal Patient to identify strategies for reducing cardiovascular risk by attending the [facility] education and nutrition series weekly.    Personal Goal #2 Patient to improve diet quality by using the plate method as a guide for meal planning to include lean protein/plant protein, fruits, vegetables, whole grains, nonfat dairy as part of a well-balanced diet.    Personal Goal #3 Patient to limit sodium intake to 2300mg  per day    Comments Patient with medical history of s/p TAVR, hyperlipidemia, CAD, aortic stenosis, s/p coronary artery stent placement. Ronny's LDL remains well controlled. Patient will benefit from participation in intensive cardiac rehab for nutrition, exercise, and lifestyle modification.      Intervention Plan   Intervention Prescribe, educate and counsel regarding individualized specific dietary modifications aiming towards targeted core components such as weight, hypertension, lipid management, diabetes, heart failure and other comorbidities.;Nutrition handout(s) given to patient.    Expected Outcomes Short Term Goal: Understand basic principles of dietary content, such as calories, fat, sodium, cholesterol and nutrients.;Long Term Goal: Adherence to prescribed nutrition plan.             Nutrition Assessments:  Nutrition Assessments - 08/13/23 1512       Rate Your Plate Scores   Pre  Score 63            MEDIFICTS Score Key: >=70 Need to make dietary changes  40-70 Heart Healthy Diet <= 40 Therapeutic Level Cholesterol Diet   Flowsheet Row INTENSIVE CARDIAC REHAB from 08/12/2023 in [facility]  Picture Your Plate Total Score on Admission 63      Picture Your Plate Scores: <78 Unhealthy dietary pattern with much  room for improvement. 41-50 Dietary pattern unlikely to meet recommendations for good health and room for improvement. 51-60 More healthful dietary pattern, with some room for improvement.  >60 Healthy dietary pattern, although there may be some specific behaviors that could be improved.    Nutrition Goals Re-Evaluation:  Nutrition Goals Re-Evaluation     Row Name 07/29/23 1603             Goals   Current Weight 170 lb 6.7 oz (77.3 kg)       Comment lipoproteinA WNL, lipids WNL, lDL 56       Expected Outcome Patient with medical history of s/p TAVR, hyperlipidemia, CAD, aortic stenosis, s/p coronary artery stent placement. Ronny's LDL remains well controlled. Patient will benefit from participation in intensive cardiac rehab for nutrition, exercise, and lifestyle modification.                Nutrition Goals Re-Evaluation:  Nutrition Goals Re-Evaluation     Row Name 07/29/23 1603             Goals   Current Weight 170 lb 6.7 oz (77.3 kg)       Comment lipoproteinA WNL, lipids WNL, lDL 56       Expected Outcome Patient with medical history of s/p TAVR, hyperlipidemia, CAD, aortic stenosis, s/p coronary artery stent placement. Ronny's LDL remains well controlled. Patient will benefit from participation in intensive cardiac rehab for nutrition, exercise, and lifestyle modification.                Nutrition Goals Discharge (Final Nutrition Goals Re-Evaluation):  Nutrition Goals Re-Evaluation - 07/29/23 1603       Goals   Current Weight 170 lb 6.7 oz (77.3 kg)     Comment lipoproteinA WNL, lipids WNL, lDL 56    Expected Outcome Patient with medical history of s/p TAVR, hyperlipidemia, CAD, aortic stenosis, s/p coronary artery stent placement. Ronny's LDL remains well controlled. Patient will benefit from participation in intensive cardiac rehab for nutrition, exercise, and lifestyle modification.             Psychosocial: Target Goals: Acknowledge presence or absence of significant depression and/or stress, maximize coping skills, provide positive support system. Participant is able to verbalize types and ability to use techniques and skills needed for reducing stress and depression.  Initial Review & Psychosocial Screening:  Initial Psych Review & Screening - 07/23/23 1249       Initial Review   Current issues with Current Sleep Concerns      Family Dynamics   Good Support System? Yes   Pt has sposue for support     Barriers   Psychosocial barriers to participate in program There are no identifiable barriers or psychosocial needs.      Screening Interventions   Interventions Encouraged to exercise             Quality of Life Scores:  Quality of Life - 07/23/23 1251       Quality of Life   Select Quality of Life      Quality of Life Scores   Health/Function Pre 28.1 %    Socioeconomic Pre 30 %    Psych/Spiritual Pre 30 %    Family Pre 30 %    GLOBAL Pre 29.14 %            Scores of 19 and below usually indicate a poorer quality of life in these areas.  A difference of  2-3 points is a  clinically meaningful difference.  A difference of 2-3 points in the total score of the Quality of Life Index has been associated with significant improvement in overall quality of life, self-image, physical symptoms, and general health in studies assessing change in quality of life.  PHQ-9: Review Flowsheet       07/23/2023 02/04/2020  Depression screen PHQ 2/9  Decreased Interest 0 0  Down, Depressed, Hopeless 0 0  PHQ - 2 Score 0 0   Altered sleeping 1 -  Tired, decreased energy 1 -  Change in appetite 0 -  Feeling bad or failure about yourself  0 -  Trouble concentrating 0 -  Moving slowly or fidgety/restless 0 -  Suicidal thoughts 0 -  PHQ-9 Score 2 -  Difficult doing work/chores Not difficult at all -    Details           Interpretation of Total Score  Total Score Depression Severity:  1-4 = Minimal depression, 5-9 = Mild depression, 10-14 = Moderate depression, 15-19 = Moderately severe depression, 20-27 = Severe depression   Psychosocial Evaluation and Intervention:   Psychosocial Re-Evaluation:  Psychosocial Re-Evaluation     Row Name 07/30/23 0818 08/06/23 1637 08/23/23 1610         Psychosocial Re-Evaluation   Current issues with Current Sleep Concerns None Identified None Identified     Comments Ronny did not voice any concerns or stressors on his first day of exercise Ronny did not voice any increased concerns or stressors during exercise at cardiac rehab Richarda Overlie continues not voice any increased concerns or stressors during exercise at cardiac rehab     Interventions Encouraged to attend Cardiac Rehabilitation for the exercise;Relaxation education Stress management education;Encouraged to attend Cardiac Rehabilitation for the exercise;Relaxation education Stress management education;Encouraged to attend Cardiac Rehabilitation for the exercise;Relaxation education     Continue Psychosocial Services  Follow up required by staff No Follow up required No Follow up required              Psychosocial Discharge (Final Psychosocial Re-Evaluation):  Psychosocial Re-Evaluation - 08/23/23 0928       Psychosocial Re-Evaluation   Current issues with None Identified    Comments Ronny continues not voice any increased concerns or stressors during exercise at cardiac rehab    Interventions Stress management education;Encouraged to attend Cardiac Rehabilitation for the exercise;Relaxation education     Continue Psychosocial Services  No Follow up required             Vocational Rehabilitation: Provide vocational rehab assistance to qualifying candidates.   Vocational Rehab Evaluation & Intervention:  Vocational Rehab - 07/23/23 1250       Initial Vocational Rehab Evaluation & Intervention   Assessment shows need for Vocational Rehabilitation No   Pt is retired            Education: Education Goals: Education classes will be provided on a weekly basis, covering required topics. Participant will state understanding/return demonstration of topics presented.    Education     Row Name 07/29/23 1700     Education   Cardiac Education Topics [facility]in   Geographical information systems officer Psychosocial   Psychosocial Workshop From Head to Heart: The Power of a Healthy Outlook   Instruction Review Code 1- Verbalizes Understanding   Class Start Time 1400   Class Stop Time 1450   Class Time Calculation (min) 50 min    Row Name  07/31/23 1500     Education   Cardiac Education Topics [facility]in   Customer service manager   Weekly Topic Adding Flavor - Sodium-Free   Instruction Review Code 1- Verbalizes Understanding   Class Start Time 1400   Class Stop Time 1440   Class Time Calculation (min) 40 min    Row Name 08/05/23 1600     Education   Cardiac Education Topics Pritikin   Select Workshops     Workshops   Educator Exercise Physiologist   Select Exercise   Exercise Workshop Location manager and Fall Prevention   Instruction Review Code 1- Verbalizes Understanding   Class Start Time 1406   Class Stop Time 1500   Class Time Calculation (min) 54 min    Row Name 08/07/23 1500     Education   Cardiac Education Topics Pritikin   Customer service manager   Weekly Topic Fast and Healthy Breakfasts   Instruction Review Code 1- Verbalizes  Understanding   Class Start Time 1355   Class Stop Time 1435   Class Time Calculation (min) 40 min    Row Name 08/12/23 1400     Education   Cardiac Education Topics Pritikin   Nurse, children's Exercise Physiologist   Select Psychosocial   Psychosocial Healthy Minds, Bodies, Hearts   Instruction Review Code 1- Verbalizes Understanding   Class Start Time 1357   Class Stop Time 1429   Class Time Calculation (min) 32 min    Row Name 08/14/23 1400     Education   Cardiac Education Topics Pritikin   Nurse, children's Nurse   Select Nutrition   Nutrition Becoming a Pritikin Chef   Instruction Review Code 1- Verbalizes Understanding   Class Start Time 1356    Row Name 08/19/23 1400     Education   Cardiac Education Topics Pritikin   Select Core Videos     Core Videos   Educator Exercise Physiologist   Select Nutrition   Nutrition Other  Label Reading   Instruction Review Code 1- Verbalizes Understanding   Class Start Time 1356   Class Stop Time 1430   Class Time Calculation (min) 34 min    Row Name 08/21/23 1500     Education   Cardiac Education Topics Pritikin   Customer service manager   Weekly Topic Tasty Appetizers and Snacks   Instruction Review Code 1- Verbalizes Understanding   Class Start Time 1355   Class Stop Time 1440   Class Time Calculation (min) 45 min            Core Videos: Exercise    Move It!  Clinical staff conducted group or individual video education with verbal and written material and guidebook.  Patient learns the recommended Pritikin exercise program. Exercise with the goal of living a long, healthy life. Some of the health benefits of exercise include controlled diabetes, healthier blood pressure levels, improved cholesterol levels, improved heart and lung capacity, improved sleep, and better body composition. Everyone should speak  with their doctor before starting or changing an exercise routine.  Biomechanical Limitations Clinical staff conducted group or individual video education with verbal and written material and guidebook.  Patient learns how biomechanical limitations can  impact exercise and how we can mitigate and possibly overcome limitations to have an impactful and balanced exercise routine.  Body Composition Clinical staff conducted group or individual video education with verbal and written material and guidebook.  Patient learns that body composition (ratio of muscle mass to fat mass) is a key component to assessing overall fitness, rather than body weight alone. Increased fat mass, especially visceral belly fat, can put [place] at increased risk for metabolic syndrome, type 2 diabetes, heart disease, and even death. It is recommended to combine diet and exercise (cardiovascular and resistance training) to improve your body composition. Seek guidance from your physician and exercise physiologist before implementing an exercise routine.  Exercise Action Plan Clinical staff conducted group or individual video education with verbal and written material and guidebook.  Patient learns the recommended strategies to achieve and enjoy long-term exercise adherence, including variety, self-motivation, self-efficacy, and positive decision making. Benefits of exercise include fitness, good health, weight management, more energy, better sleep, less stress, and overall well-being.  Medical   Heart Disease Risk Reduction Clinical staff conducted group or individual video education with verbal and written material and guidebook.  Patient learns our heart is our most vital organ as it circulates oxygen, nutrients, white blood cells, and hormones throughout the entire body, and carries waste away. Data supports a plant-based eating plan like the Pritikin Program for its effectiveness in slowing progression of and reversing heart  disease. The video provides a number of recommendations to address heart disease.   Metabolic Syndrome and Belly Fat  Clinical staff conducted group or individual video education with verbal and written material and guidebook.  Patient learns what metabolic syndrome is, how it leads to heart disease, and how one can reverse it and keep it from coming back. You have metabolic syndrome if you have 3 of the following 5 criteria: abdominal obesity, high blood pressure, high triglycerides, low HDL cholesterol, and high blood sugar.  Hypertension and Heart Disease Clinical staff conducted group or individual video education with verbal and written material and guidebook.  Patient learns that high blood pressure, or hypertension, is very common in the [place]. Hypertension is largely due to excessive salt intake, but other important risk factors include being overweight, physical inactivity, drinking too much alcohol, smoking, and not eating enough potassium from fruits and vegetables. High blood pressure is a leading risk factor for heart attack, stroke, congestive heart failure, dementia, kidney failure, and premature death. Long-term effects of excessive salt intake include stiffening of the arteries and thickening of heart muscle and organ damage. Recommendations include ways to reduce hypertension and the risk of heart disease.  Diseases of Our Time - Focusing on Diabetes Clinical staff conducted group or individual video education with verbal and written material and guidebook.  Patient learns why the best way to stop diseases of our time is prevention, through food and other lifestyle changes. Medicine (such as prescription pills and surgeries) is often only a Band-Aid on the problem, not a long-term solution. Most common diseases of our time include obesity, type 2 diabetes, hypertension, heart disease, and cancer. The Pritikin Program is recommended and has been proven to help reduce, reverse,  and/or prevent the damaging effects of metabolic syndrome.  Nutrition   Overview of the Pritikin Eating Plan  Clinical staff conducted group or individual video education with verbal and written material and guidebook.  Patient learns about the Pritikin Eating Plan for disease risk reduction. The Pritikin Eating Plan emphasizes a  wide variety of unrefined, minimally-processed carbohydrates, like fruits, vegetables, whole grains, and legumes. Go, Caution, and Stop food choices are explained. Plant-based and lean animal proteins are emphasized. Rationale provided for low sodium intake for blood pressure control, low added sugars for blood sugar stabilization, and low added fats and oils for coronary artery disease risk reduction and weight management.  Calorie Density  Clinical staff conducted group or individual video education with verbal and written material and guidebook.  Patient learns about calorie density and how it impacts the Pritikin Eating Plan. Knowing the characteristics of the food you choose will help you decide whether those foods will lead to weight gain or weight loss, and whether you want to consume more or less of them. Weight loss is usually a side effect of the Pritikin Eating Plan because of its focus on low calorie-dense foods.  Label Reading  Clinical staff conducted group or individual video education with verbal and written material and guidebook.  Patient learns about the Pritikin recommended label reading guidelines and corresponding recommendations regarding calorie density, added sugars, sodium content, and whole grains.  Dining Out - Part 1  Clinical staff conducted group or individual video education with verbal and written material and guidebook.  Patient learns that restaurant meals can be sabotaging because they can be so high in calories, fat, sodium, and/or sugar. Patient learns recommended strategies on how to positively address this and avoid unhealthy  pitfalls.  Facts on Fats  Clinical staff conducted group or individual video education with verbal and written material and guidebook.  Patient learns that lifestyle modifications can be just as effective, if not more so, as many medications for lowering your risk of heart disease. A Pritikin lifestyle can help to reduce your risk of inflammation and atherosclerosis (cholesterol build-up, or plaque, in the artery walls). Lifestyle interventions such as dietary choices and physical activity address the cause of atherosclerosis. A review of the types of fats and their impact on blood cholesterol levels, along with dietary recommendations to reduce fat intake is also included.  Nutrition Action Plan  Clinical staff conducted group or individual video education with verbal and written material and guidebook.  Patient learns how to incorporate Pritikin recommendations into their lifestyle. Recommendations include planning and keeping personal health goals in mind as an important part of their success.  Healthy Mind-Set    Healthy Minds, Bodies, Hearts  Clinical staff conducted group or individual video education with verbal and written material and guidebook.  Patient learns how to identify when they are stressed. Video will discuss the impact of that stress, as well as the many benefits of stress management. Patient will also be introduced to stress management techniques. The way we think, act, and feel has an impact on our hearts.  How Our Thoughts Can Heal Our Hearts  Clinical staff conducted group or individual video education with verbal and written material and guidebook.  Patient learns that negative thoughts can cause depression and anxiety. This can result in negative lifestyle behavior and serious health problems. Cognitive behavioral therapy is an effective method to help control our thoughts in order to change and improve our emotional outlook.  Additional Videos:  Exercise    Improving  Performance  Clinical staff conducted group or individual video education with verbal and written material and guidebook.  Patient learns to use a non-linear approach by alternating intensity levels and lengths of time spent exercising to help burn more calories and lose more body fat. Cardiovascular exercise helps  improve heart health, metabolism, hormonal balance, blood sugar control, and recovery from fatigue. Resistance training improves strength, endurance, balance, coordination, reaction time, metabolism, and muscle mass. Flexibility exercise improves circulation, posture, and balance. Seek guidance from your physician and exercise physiologist before implementing an exercise routine and learn your capabilities and proper form for all exercise.  Introduction to Yoga  Clinical staff conducted group or individual video education with verbal and written material and guidebook.  Patient learns about yoga, a discipline of the coming together of mind, breath, and body. The benefits of yoga include improved flexibility, improved range of motion, better posture and core strength, increased lung function, weight loss, and positive self-image. Yoga's heart health benefits include lowered blood pressure, healthier heart rate, decreased cholesterol and triglyceride levels, improved immune function, and reduced stress. Seek guidance from your physician and exercise physiologist before implementing an exercise routine and learn your capabilities and proper form for all exercise.  Medical   Aging: Enhancing Your Quality of Life  Clinical staff conducted group or individual video education with verbal and written material and guidebook.  Patient learns key strategies and recommendations to stay in good physical health and enhance quality of life, such as prevention strategies, having an advocate, securing a Health Care Proxy and Power of Attorney, and keeping a list of medications and system for tracking them. It  also discusses how to avoid risk for bone loss.  Biology of Weight Control  Clinical staff conducted group or individual video education with verbal and written material and guidebook.  Patient learns that weight gain occurs because we consume more calories than we burn (eating more, moving less). Even if your body weight is normal, you may have higher ratios of fat compared to muscle mass. Too much body fat puts you at increased risk for cardiovascular disease, heart attack, stroke, type 2 diabetes, and obesity-related cancers. In addition to exercise, following the Pritikin Eating Plan can help reduce your risk.  Decoding Lab Results  Clinical staff conducted group or individual video education with verbal and written material and guidebook.  Patient learns that lab test reflects one measurement whose values change over time and are influenced by many factors, including medication, stress, sleep, exercise, food, hydration, pre-existing medical conditions, and more. It is recommended to use the knowledge from this video to become more involved with your lab results and evaluate your numbers to speak with your doctor.   Diseases of Our Time - Overview  Clinical staff conducted group or individual video education with verbal and written material and guidebook.  Patient learns that according to the CDC, 50% to 70% of chronic diseases (such as obesity, type 2 diabetes, elevated lipids, hypertension, and heart disease) are avoidable through lifestyle improvements including healthier food choices, listening to satiety cues, and increased physical activity.  Sleep Disorders Clinical staff conducted group or individual video education with verbal and written material and guidebook.  Patient learns how good quality and duration of sleep are important to overall health and well-being. Patient also learns about sleep disorders and how they impact health along with recommendations to address them, including  discussing with a physician.  Nutrition  Dining Out - Part 2 Clinical staff conducted group or individual video education with verbal and written material and guidebook.  Patient learns how to plan ahead and communicate in order to maximize their dining experience in a healthy and nutritious manner. Included are recommended food choices based on the type of restaurant the patient is visiting.  Fueling a Banker conducted group or individual video education with verbal and written material and guidebook.  There is a strong connection between our food choices and our health. Diseases like obesity and type 2 diabetes are very prevalent and are in large-part due to lifestyle choices. The Pritikin Eating Plan provides plenty of food and hunger-curbing satisfaction. It is easy to follow, affordable, and helps reduce health risks.  Menu Workshop  Clinical staff conducted group or individual video education with verbal and written material and guidebook.  Patient learns that restaurant meals can sabotage health goals because they are often packed with calories, fat, sodium, and sugar. Recommendations include strategies to plan ahead and to communicate with the manager, chef, or server to help order a healthier meal.  Planning Your Eating Strategy  Clinical staff conducted group or individual video education with verbal and written material and guidebook.  Patient learns about the Pritikin Eating Plan and its benefit of reducing the risk of disease. The Pritikin Eating Plan does not focus on calories. Instead, it emphasizes high-quality, nutrient-rich foods. By knowing the characteristics of the foods, we choose, we can determine their calorie density and make informed decisions.  Targeting Your Nutrition Priorities  Clinical staff conducted group or individual video education with verbal and written material and guidebook.  Patient learns that lifestyle habits have a tremendous  impact on disease risk and progression. This video provides eating and physical activity recommendations based on your personal health goals, such as reducing LDL cholesterol, losing weight, preventing or controlling type 2 diabetes, and reducing high blood pressure.  Vitamins and Minerals  Clinical staff conducted group or individual video education with verbal and written material and guidebook.  Patient learns different ways to obtain key vitamins and minerals, including through a recommended healthy diet. It is important to discuss all supplements you take with your doctor.   Healthy Mind-Set    Smoking Cessation  Clinical staff conducted group or individual video education with verbal and written material and guidebook.  Patient learns that cigarette smoking and tobacco addiction pose a serious health risk which affects millions of people. Stopping smoking will significantly reduce the risk of heart disease, lung disease, and many forms of cancer. Recommended strategies for quitting are covered, including working with your doctor to develop a successful plan.  Culinary   Becoming a Set designer conducted group or individual video education with verbal and written material and guidebook.  Patient learns that cooking at home can be healthy, cost-effective, quick, and puts them in control. Keys to cooking healthy recipes will include looking at your recipe, assessing your equipment needs, planning ahead, making it simple, choosing cost-effective seasonal ingredients, and limiting the use of added fats, salts, and sugars.  Cooking - Breakfast and Snacks  Clinical staff conducted group or individual video education with verbal and written material and guidebook.  Patient learns how important breakfast is to satiety and nutrition through the entire day. Recommendations include key foods to eat during breakfast to help stabilize blood sugar levels and to prevent overeating at meals  later in the day. Planning ahead is also a key component.  Cooking - Educational psychologist conducted group or individual video education with verbal and written material and guidebook.  Patient learns eating strategies to improve overall health, including an approach to cook more at home. Recommendations include thinking of animal protein as a side on your plate rather than center stage  and focusing instead on lower calorie dense options like vegetables, fruits, whole grains, and plant-based proteins, such as beans. Making sauces in large quantities to freeze for later and leaving the skin on your vegetables are also recommended to maximize your experience.  Cooking - Healthy Salads and Dressing Clinical staff conducted group or individual video education with verbal and written material and guidebook.  Patient learns that vegetables, fruits, whole grains, and legumes are the foundations of the Pritikin Eating Plan. Recommendations include how to incorporate each of these in flavorful and healthy salads, and how to create homemade salad dressings. Proper handling of ingredients is also covered. Cooking - Soups and [employer] - Soups and Desserts Clinical staff conducted group or individual video education with verbal and written material and guidebook.  Patient learns that Pritikin soups and desserts make for easy, nutritious, and delicious snacks and meal components that are low in sodium, fat, sugar, and calorie density, while high in vitamins, minerals, and filling fiber. Recommendations include simple and healthy ideas for soups and desserts.   Overview     The Pritikin Solution Program Overview Clinical staff conducted group or individual video education with verbal and written material and guidebook.  Patient learns that the results of the Pritikin Program have been documented in more than 100 articles published in peer-reviewed journals, and the benefits include reducing  risk factors for (and, in some cases, even reversing) high cholesterol, high blood pressure, type 2 diabetes, obesity, and more! An overview of the three key pillars of the Pritikin Program will be covered: eating well, doing regular exercise, and having a healthy mind-set.  WORKSHOPS  Exercise: Exercise Basics: Building Your Action Plan Clinical staff led group instruction and group discussion with PowerPoint presentation and patient guidebook. To enhance the learning environment the use of posters, models and videos may be added. At the conclusion of this workshop, patients will comprehend the difference between physical activity and exercise, as well as the benefits of incorporating both, into their routine. Patients will understand the FITT (Frequency, Intensity, Time, and Type) principle and how to use it to build an exercise action plan. In addition, safety concerns and other considerations for exercise and cardiac rehab will be addressed by the presenter. The purpose of this lesson is to promote a comprehensive and effective weekly exercise routine in order to improve patients' overall level of fitness.   Managing Heart Disease: Your Path to a Healthier Heart Clinical staff led group instruction and group discussion with PowerPoint presentation and patient guidebook. To enhance the learning environment the use of posters, models and videos may be added.At the conclusion of this workshop, patients will understand the anatomy and physiology of the heart. Additionally, they will understand how Pritikin's three pillars impact the risk factors, the progression, and the management of heart disease.  The purpose of this lesson is to provide a high-level overview of the heart, heart disease, and how the Pritikin lifestyle positively impacts risk factors.  Exercise Biomechanics Clinical staff led group instruction and group discussion with PowerPoint presentation and patient guidebook. To enhance  the learning environment the use of posters, models and videos may be added. Patients will learn how the structural parts of their bodies function and how these functions impact their daily activities, movement, and exercise. Patients will learn how to promote a neutral spine, learn how to manage pain, and identify ways to improve their physical movement in order to promote healthy living. The purpose of this lesson is  to expose patients to common physical limitations that impact physical activity. Participants will learn practical ways to adapt and manage aches and pains, and to minimize their effect on regular exercise. Patients will learn how to maintain good posture while sitting, walking, and lifting.  Balance Training and Fall Prevention  Clinical staff led group instruction and group discussion with PowerPoint presentation and patient guidebook. To enhance the learning environment the use of posters, models and videos may be added. At the conclusion of this workshop, patients will understand the importance of their sensorimotor skills (vision, proprioception, and the vestibular system) in maintaining their ability to balance as they age. Patients will apply a variety of balancing exercises that are appropriate for their current level of function. Patients will understand the common causes for poor balance, possible solutions to these problems, and ways to modify their physical environment in order to minimize their fall risk. The purpose of this lesson is to teach patients about the importance of maintaining balance as they age and ways to minimize their risk of falling.  WORKSHOPS   Nutrition:  Fueling a Ship broker led group instruction and group discussion with PowerPoint presentation and patient guidebook. To enhance the learning environment the use of posters, models and videos may be added. Patients will review the foundational principles of the Pritikin Eating Plan  and understand what constitutes a serving size in each of the food groups. Patients will also learn Pritikin-friendly foods that are better choices when away from home and review make-ahead meal and snack options. Calorie density will be reviewed and applied to three nutrition priorities: weight maintenance, weight loss, and weight gain. The purpose of this lesson is to reinforce (in a group setting) the key concepts around what patients are recommended to eat and how to apply these guidelines when away from home by planning and selecting Pritikin-friendly options. Patients will understand how calorie density may be adjusted for different weight management goals.  Mindful Eating  Clinical staff led group instruction and group discussion with PowerPoint presentation and patient guidebook. To enhance the learning environment the use of posters, models and videos may be added. Patients will briefly review the concepts of the Pritikin Eating Plan and the importance of low-calorie dense foods. The concept of mindful eating will be introduced as well as the importance of paying attention to internal hunger signals. Triggers for non-hunger eating and techniques for dealing with triggers will be explored. The purpose of this lesson is to provide patients with the opportunity to review the basic principles of the Pritikin Eating Plan, discuss the value of eating mindfully and how to measure internal cues of hunger and fullness using the Hunger Scale. Patients will also discuss reasons for non-hunger eating and learn strategies to use for controlling emotional eating.  Targeting Your Nutrition Priorities Clinical staff led group instruction and group discussion with PowerPoint presentation and patient guidebook. To enhance the learning environment the use of posters, models and videos may be added. Patients will learn how to determine their genetic susceptibility to disease by reviewing their family history. Patients  will gain insight into the importance of diet as part of an overall healthy lifestyle in mitigating the impact of genetics and other environmental insults. The purpose of this lesson is to provide patients with the opportunity to assess their personal nutrition priorities by looking at their family history, their own health history and current risk factors. Patients will also be able to discuss ways of prioritizing and modifying  the Pritikin Eating Plan for their highest risk areas  Menu  Clinical staff led group instruction and group discussion with PowerPoint presentation and patient guidebook. To enhance the learning environment the use of posters, models and videos may be added. Using menus brought in from [employer], or printed from [employer], patients will apply the Pritikin dining out guidelines that were presented in the [employer] video. Patients will also be able to practice these guidelines in a variety of provided scenarios. The purpose of this lesson is to provide patients with the opportunity to practice hands-on learning of the Pritikin Dining Out guidelines with actual menus and practice scenarios.  Label Reading Clinical staff led group instruction and group discussion with PowerPoint presentation and patient guidebook. To enhance the learning environment the use of posters, models and videos may be added. Patients will review and discuss the Pritikin label reading guidelines presented in Pritikin's Label Reading Educational series video. Using fool labels brought in from local grocery stores and markets, patients will apply the label reading guidelines and determine if the packaged food meet the Pritikin guidelines. The purpose of this lesson is to provide patients with the opportunity to review, discuss, and practice hands-on learning of the Pritikin Label Reading guidelines with actual packaged food labels. Cooking School  Pritikin's [employer]  are designed to teach patients ways to prepare quick, simple, and affordable recipes at home. The importance of nutrition's role in chronic disease risk reduction is reflected in its emphasis in the overall Pritikin program. By learning how to prepare essential core Pritikin Eating Plan recipes, patients will increase control over what they eat; be able to customize the flavor of foods without the use of added salt, sugar, or fat; and improve the quality of the food they consume. By learning a set of core recipes which are easily assembled, quickly prepared, and affordable, patients are more likely to prepare more healthy foods at home. These workshops focus on convenient breakfasts, simple entres, side dishes, and desserts which can be prepared with minimal effort and are consistent with nutrition recommendations for cardiovascular risk reduction. Cooking [employer] are taught by a Armed forces logistics/support/administrative officer (RD) who has been trained by the [employer]. The chef or RD has a clear understanding of the importance of minimizing - if not completely eliminating - added fat, sugar, and sodium in recipes. Throughout the series of Cooking School Workshop sessions, patients will learn about healthy ingredients and efficient methods of cooking to build confidence in their capability to prepare    Cooking School weekly topics:  Adding Flavor- Sodium-Free  Fast and Healthy Breakfasts  Powerhouse Plant-Based Proteins  Satisfying Salads and Dressings  Simple Sides and Sauces  International Cuisine-Spotlight on the [employer] Zones  Delicious Desserts  Savory Soups  [employer] - Meals in a Astronomer Appetizers and Snacks  Comforting Weekend Breakfasts  One-Pot Wonders   Fast Evening Meals  Landscape architect Your Pritikin Plate  WORKSHOPS   Healthy Mindset (Psychosocial):  Focused Goals, Sustainable Changes Clinical staff led group instruction and group discussion  with PowerPoint presentation and patient guidebook. To enhance the learning environment the use of posters, models and videos may be added. Patients will be able to apply effective goal setting strategies to establish at least one personal goal, and then take consistent, meaningful action toward that goal. They will learn to identify common barriers to achieving personal goals and develop strategies to overcome  them. Patients will also gain an understanding of how our mind-set can impact our ability to achieve goals and the importance of cultivating a positive and growth-oriented mind-set. The purpose of this lesson is to provide patients with a deeper understanding of how to set and achieve personal goals, as well as the tools and strategies needed to overcome common obstacles which may arise along the way.  From Head to Heart: The Power of a Healthy Outlook  Clinical staff led group instruction and group discussion with PowerPoint presentation and patient guidebook. To enhance the learning environment the use of posters, models and videos may be added. Patients will be able to recognize and describe the impact of emotions and mood on physical health. They will discover the importance of self-care and explore self-care practices which may work for them. Patients will also learn how to utilize the 4 C's to cultivate a healthier outlook and better manage stress and challenges. The purpose of this lesson is to demonstrate to patients how a healthy outlook is an essential part of maintaining good health, especially as they continue their cardiac rehab journey.  Healthy Sleep for a Healthy Heart Clinical staff led group instruction and group discussion with PowerPoint presentation and patient guidebook. To enhance the learning environment the use of posters, models and videos may be added. At the conclusion of this workshop, patients will be able to demonstrate knowledge of the importance of sleep to overall  health, well-being, and quality of life. They will understand the symptoms of, and treatments for, common sleep disorders. Patients will also be able to identify daytime and nighttime behaviors which impact sleep, and they will be able to apply these tools to help manage sleep-related challenges. The purpose of this lesson is to provide patients with a general overview of sleep and outline the importance of quality sleep. Patients will learn about a few of the most common sleep disorders. Patients will also be introduced to the concept of "sleep hygiene," and discover ways to self-manage certain sleeping problems through simple daily behavior changes. Finally, the workshop will motivate patients by clarifying the links between quality sleep and their goals of heart-healthy living.   Recognizing and Reducing Stress Clinical staff led group instruction and group discussion with PowerPoint presentation and patient guidebook. To enhance the learning environment the use of posters, models and videos may be added. At the conclusion of this workshop, patients will be able to understand the types of stress reactions, differentiate between acute and chronic stress, and recognize the impact that chronic stress has on their health. They will also be able to apply different coping mechanisms, such as reframing negative self-talk. Patients will have the opportunity to practice a variety of stress management techniques, such as deep abdominal breathing, progressive muscle relaxation, and/or guided imagery.  The purpose of this lesson is to educate patients on the role of stress in their lives and to provide healthy techniques for coping with it.  Learning Barriers/Preferences:  Learning Barriers/Preferences - 07/23/23 1250       Learning Barriers/Preferences   Learning Barriers Sight;Hearing   pt wears glasses and bilateral hearing aids   Learning Preferences Audio;Computer/Internet;Group Instruction;Individual  Instruction;Pictoral;Skilled Demonstration;Verbal Instruction;Video;Written Material             Education Topics:  Knowledge Questionnaire Score:  Knowledge Questionnaire Score - 07/23/23 1252       Knowledge Questionnaire Score   Pre Score 20/24  Core Components/Risk Factors/Patient Goals at Admission:  Personal Goals and Risk Factors at Admission - 07/23/23 1250       Core Components/Risk Factors/Patient Goals on Admission   Hypertension Yes    Intervention Provide education on lifestyle modifcations including regular physical activity/exercise, weight management, moderate sodium restriction and increased consumption of fresh fruit, vegetables, and low fat dairy, alcohol moderation, and smoking cessation.;Monitor prescription use compliance.    Expected Outcomes Short Term: Continued assessment and intervention until BP is < 140/23mm HG in hypertensive participants. < 130/26mm HG in hypertensive participants with diabetes, heart failure or chronic kidney disease.;Long Term: Maintenance of blood pressure at goal levels.    Lipids Yes    Intervention Provide education and support for participant on nutrition & aerobic/resistive exercise along with prescribed medications to achieve LDL 70mg , HDL >40mg .    Expected Outcomes Short Term: Participant states understanding of desired cholesterol values and is compliant with medications prescribed. Participant is following exercise prescription and nutrition guidelines.;Long Term: Cholesterol controlled with medications as prescribed, with individualized exercise RX and with personalized nutrition plan. Value goals: LDL < 70mg , HDL > 40 mg.             Core Components/Risk Factors/Patient Goals Review:   Goals and Risk Factor Review     Row Name 07/30/23 0819 08/06/23 1638 08/23/23 0930         Core Components/Risk Factors/Patient Goals Review   Personal Goals Review Weight Management/Obesity;Hypertension;Lipids  Weight Management/Obesity;Hypertension;Lipids Weight Management/Obesity;Hypertension;Lipids     Review Ronny started cardiac rehab on 07/29/23. Ronny did well with exercise. Vital signs were stable. Ronny started cardiac rehab on 07/29/23. Richarda Overlie is off to a good start to exercise. Vital signs have been  stable. Richarda Overlie is doing well with exercise at cardiac rehab. . Vital signs have been  stable. Richarda Overlie has lost 3.4 kg since starting cardiac rehab.     Expected Outcomes Richarda Overlie will continue to participate in cardiac rehab for exercise nutrition and lifestyle modifications. Richarda Overlie will continue to participate in cardiac rehab for exercise nutrition and lifestyle modifications. Richarda Overlie will continue to participate in cardiac rehab for exercise nutrition and lifestyle modifications.              Core Components/Risk Factors/Patient Goals at Discharge (Final Review):   Goals and Risk Factor Review - 08/23/23 0930       Core Components/Risk Factors/Patient Goals Review   Personal Goals Review Weight Management/Obesity;Hypertension;Lipids    Review Richarda Overlie is doing well with exercise at cardiac rehab. . Vital signs have been  stable. Richarda Overlie has lost 3.4 kg since starting cardiac rehab.    Expected Outcomes Richarda Overlie will continue to participate in cardiac rehab for exercise nutrition and lifestyle modifications.             ITP Comments:  ITP Comments     Row Name 07/30/23 0816 08/06/23 1636 08/23/23 0926       ITP Comments 30 Day ITP Review. Ronny started cardiac rehab on 07/29/23. Ronny did well with exercise. 30 Day ITP Review. Ronny started cardiac rehab on 07/29/23. Richarda Overlie is off to a good start to exercise. 30 Day ITP Review. Richarda Overlie has good attendance and participation with  exercise at cardiac rehab.              Comments: See ITP comments.Thayer Headings RN BSN

## 2023-08-26 ENCOUNTER — Encounter (HOSPITAL_COMMUNITY)
Admission: RE | Admit: 2023-08-26 | Discharge: 2023-08-26 | Disposition: A | Discharge status: Home / Self Care | Payer: Medicare Other | Source: Ambulatory Visit | Attending: Cardiology | Admitting: Cardiology

## 2023-08-26 DIAGNOSIS — Z955 Presence of coronary angioplasty implant and graft: Secondary | ICD-10-CM | POA: Diagnosis not present

## 2023-08-26 DIAGNOSIS — Z952 Presence of prosthetic heart valve: Secondary | ICD-10-CM

## 2023-08-28 ENCOUNTER — Encounter (HOSPITAL_COMMUNITY): Payer: Self-pay | Admitting: Cardiology

## 2023-08-28 ENCOUNTER — Encounter (HOSPITAL_COMMUNITY)
Admission: RE | Admit: 2023-08-28 | Discharge: 2023-08-28 | Disposition: A | Discharge status: Home / Self Care | Payer: Medicare Other | Source: Ambulatory Visit | Attending: Cardiology | Admitting: Cardiology

## 2023-08-28 ENCOUNTER — Ambulatory Visit (HOSPITAL_COMMUNITY)
Admission: RE | Admit: 2023-08-28 | Discharge: 2023-08-28 | Disposition: A | Discharge status: Home / Self Care | Payer: Medicare Other | Source: Ambulatory Visit | Attending: Cardiology | Admitting: Cardiology

## 2023-08-28 VITALS — BP 138/90 | HR 60 | Wt 169.4 lb

## 2023-08-28 DIAGNOSIS — Z79899 Other long term (current) drug therapy: Secondary | ICD-10-CM | POA: Diagnosis not present

## 2023-08-28 DIAGNOSIS — I2510 Atherosclerotic heart disease of native coronary artery without angina pectoris: Secondary | ICD-10-CM | POA: Diagnosis not present

## 2023-08-28 DIAGNOSIS — Z9861 Coronary angioplasty status: Secondary | ICD-10-CM

## 2023-08-28 DIAGNOSIS — E7841 Elevated Lipoprotein(a): Secondary | ICD-10-CM | POA: Insufficient documentation

## 2023-08-28 DIAGNOSIS — Z955 Presence of coronary angioplasty implant and graft: Secondary | ICD-10-CM | POA: Diagnosis not present

## 2023-08-28 DIAGNOSIS — I7121 Aneurysm of the ascending aorta, without rupture: Secondary | ICD-10-CM | POA: Insufficient documentation

## 2023-08-28 DIAGNOSIS — Z7902 Long term (current) use of antithrombotics/antiplatelets: Secondary | ICD-10-CM | POA: Diagnosis not present

## 2023-08-28 DIAGNOSIS — I350 Nonrheumatic aortic (valve) stenosis: Secondary | ICD-10-CM

## 2023-08-28 DIAGNOSIS — I5032 Chronic diastolic (congestive) heart failure: Secondary | ICD-10-CM | POA: Diagnosis not present

## 2023-08-28 DIAGNOSIS — E782 Mixed hyperlipidemia: Secondary | ICD-10-CM | POA: Diagnosis not present

## 2023-08-28 DIAGNOSIS — Z953 Presence of xenogenic heart valve: Secondary | ICD-10-CM | POA: Diagnosis not present

## 2023-08-28 DIAGNOSIS — Z952 Presence of prosthetic heart valve: Secondary | ICD-10-CM

## 2023-08-28 LAB — BASIC METABOLIC PANEL
Anion gap: 6 (ref 5–15)
BUN: 13 mg/dL (ref 8–23)
CO2: 25 mmol/L (ref 22–32)
Calcium: 9.7 mg/dL (ref 8.9–10.3)
Chloride: 105 mmol/L (ref 98–111)
Creatinine, Ser: 0.89 mg/dL (ref 0.61–1.24)
GFR, Estimated: >60 mL/min (ref >60)
Glucose, Bld: 106 mg/dL — ABNORMAL HIGH (ref 70–99)
Potassium: 4.1 mmol/L (ref 3.5–5.1)
Sodium: 136 mmol/L (ref 135–145)

## 2023-08-28 NOTE — Patient Instructions (Signed)
Great to see you today!!!  Labs done today, your results will be available in MyChart, we will contact you for abnormal readings.  Your physician recommends that you schedule a follow-up appointment in: 3 months  If you have any questions or concerns before your next appointment please send [place] a message through [place] or call our office at [phone].    TO LEAVE A MESSAGE FOR THE NURSE SELECT OPTION 2, PLEASE LEAVE A MESSAGE INCLUDING: YOUR NAME DATE OF BIRTH CALL BACK NUMBER REASON FOR CALL**this is important as we prioritize the call backs  YOU WILL RECEIVE A CALL BACK THE SAME DAY AS LONG AS YOU CALL BEFORE 4:00 PM  At the [facility], you and your health needs are our priority. As part of our continuing mission to provide you with exceptional heart care, we have created designated Provider Care Teams. These Care Teams include your primary Cardiologist (physician) and Advanced Practice Providers (APPs- Physician Assistants and Nurse Practitioners) who all work together to provide you with the care you need, when you need it.   You may see any of the following providers on your designated Care Team at your next follow up: Dr Arvilla Meres Dr Marca Ancona Dr. Dorthula Nettles Dr. Clearnce Hasten Amy Filbert Schilder, NP Robbie Lis, [place] [facility] [place], [place] Brynda Peon, NP [place] Lee, NP Karle Plumber, PharmD   Please be sure to bring in all your medications bottles to every appointment.    Thank you for choosing [facility]

## 2023-08-29 NOTE — Progress Notes (Signed)
Patient ID: Danny [name], male   DOB: 15-Oct-1935, 87 y.o.   MRN: [id number] PCP: Dr. Pete Glatter Cardiology: Dr. Shirlee Latch  87 y.o. yo with history of nonobstructive CAD presents for followup of CAD and aortic stenosis.  He last had an ETT-Cardiolite in 6/16 that was normal.  Echo in 9/17 showed EF 55-60%, mild aortic stenosis, 4.0 cm ascending aorta.  MRA chest in 12/18 showed tortuous thoracic aorta without aneurysm. Echo in 11/19 showed EF 60-65%, mild AS/mild AI.  Echo in 12/21 showed EF 60-65% with mild AI, mild AS.  PYP scan in 12/21 was grade 1 with H/CL 1.15, probably negative for TTR cardiac amyloidosis.   Cardiolite in 1/23 showed EF 68%, no ischemia/infarction.   Patient has been doing well from a cardiac standpoint.  He has had no exertional dyspnea or chest pain.  He has right knee pain from OA, bothers him when he golfs.  No palpitations. SBP 110s-120s at home.   Echo 12/23 showed EF 60-65%, RV normal, moderate aortic stenosis, AVA 0.81 cm^2, AoV mean gradient 26 mmHg, peak gradient 41 mmHg.  Patient developed exertional dyspnea in 2024.  Workup showed moderate to moderate-severe aortic stenosis by echo.  LHC in 8/24 showed 95% mid LAD stenosis, treated with DES.  Patient had TAVR as part of the PROGRESS-CAP trial for TAVR in moderate AS.  Post-TAVR echo in 10/24 showed EF 60-65%, normal RV, mild MR, stable bioprosthetic aortic valve.   He is doing well today.  Exertional dyspnea has resolved.  No chest pain.  He is doing cardiac rehab.  Main complaint is low back pain with sciatica on the right.  No orthopnea/PND.  No palpitations. BP is mildly elevated today but runs 110s-120s at home.   ECG (personally reviewed): NSR, 1st degree AVB, LAFB  Labs (8/14): K 3.9, creatinine 0.89 Labs (12/14): LDL 62, HDL 48   Labs (6/16): LDL 56 Labs (1/17): creatinine 0.88, HCT 43.9 Labs (8/17): K 4.3, creatinine 0.88, LFTs normal Labs (9/17): CK normal, Mg normal Labs (11/17): LDL 32, HDL  52 Labs (11/18): LDL 50 Labs (11/19): LDL 57, HDL 53, K 4, creatinine 0.82 Labs (3/21): LDL 53 Labs (4/21): K 3.8, creatinine 0.83, myeloma panel negative, urine immunofixation negative, LDL 68 Labs (11/22): LDL 45 Labs (12/23): K 4.3, creatinine 0.98, LDL 56 Labs (8/24): Lp(a) 96 Labs (10/24): K 4.6, creatinine 0.88  PMH: 1. Rotator cuff surgery 2. Diverticulosis 3. Chronic persistent hepatitis 4. Impaired fasting glucose 5. 6th nerve palsy in 2001 6. Allergic rhinitis 7. Low back pain s/p several operations.  Most recently had microdiscectomy in 1/17.  8. CAD: Patient developed exertional dyspnea.  ETT-myoview (8/12) with 11 METS, normal LV EF, normal perfusion at rest and stress but elevated TID ratio.  Coronary CT angiogram in 9/12 showed equivocal significant disease in the CFX.  Cath in 10/12 showed EF 55-60%, 30% mid RCA, 40% distal RCA, 40% proximal LAD.  ETT-Cardiolite in 10/14 showed EF 62%, probably normal study with diaphragmatic attenuation.   Echo (4/15) with EF 55-60%, mild MR.  - Cardiolite (6/16) with no ischemia/infarction.  - Echo (9/17) with EF 55-60%, mild aortic stenosis, 4.0 cm ascending aorta.  - Echo (11/19): EF 60-65%, mild AS mean gradient 9 mmHg with AVA 1.53 cm^2, mild AI.  - Echo (12/21): EF 60-65%, mild AS, mild AI.  - Cardiolite (1/23): EF 68%, no ischemia/infarction. - LHC in 8/24 with 95% mLAD stenosis treated with DES.  9. GERD 10. Hyperlipidemia: Myalgias with atorvastatin.  11. Aortic stenosis: Moderate on 12/23 echo. With worsening exertional dyspnea, he was enrolled in the PROGRESS CAP trial and had TAVR.   - Post-TAVR echo in 10/24: EF 60-65%, normal RV, mild MR, stable bioprosthetic aortic valve.  12. Ascending aorta dilation: MRA chest (12/18) showed a tortuous ascending aorta without aneurysmal dilation.  13. Peripheral neuropathy of uncertain etiology.  14. PYP scan (12/21): grade 1, H/CL 1.15 (probably negative)  FH: Father with CVA at  62  SH: Married, never smoked, rare ETOH. 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  ROS: All systems reviewed and negative except as per HPI.   Current Outpatient Medications  Medication Sig Dispense Refill   amoxicillin (AMOXIL) 500 MG capsule Take 4 capsules (2,000 mg total) by mouth once as needed for up to 1 dose. 1 HOUR PRIOR TO DENTAL CLEANINGS 4 capsule 4   aspirin EC 81 MG tablet Take 81 mg by mouth in the morning.     betamethasone dipropionate 0.05 % cream Apply 0.5 application  topically 2 (two) times daily as needed (dry/irritated skin).     Calcium-Magnesium (CAL-MAG PO) Take 1 tablet by mouth in the morning. With Potassium 500 mg /500 mg /99 mg     cholecalciferol (VITAMIN D3) 25 MCG (1000 UNIT) tablet Take 1,000 Units by mouth daily.     clopidogrel (PLAVIX) 75 MG tablet Take 1 tablet (75 mg total) by mouth daily with breakfast. 90 tablet 3   Coenzyme Q10 (CO Q 10 PO) Take 400 mg by mouth in the morning.     finasteride (PROSCAR) 5 MG tablet Take 5 mg by mouth in the morning.     furosemide (LASIX) 20 MG tablet Take 1 tablet (20 mg total) by mouth as needed (For weight gain of 3 lbs in 24 hours or 5 lbs in a week). For weight gain of 3 lbs in 24 hours or 5 lbs in a week 30 tablet 0   gabapentin (NEURONTIN) 100 MG capsule Take 1 capsule (100 mg total) by mouth daily as needed. 30 capsule 5   gabapentin (NEURONTIN) 600 MG tablet TAKE 1 TABLET AT 4PM AND TAKE 1 TABLET AT BEDTIME. 180 tablet 3   Magnesium Oxide 400 MG CAPS Take 1 capsule (400 mg total) by mouth daily. 30 capsule 11   metoprolol succinate (TOPROL-XL) 25 MG 24 hr tablet TAKE ONE TABLET BY MOUTH DAILY 30 tablet 3   Multiple Vitamin (MULTIVITAMIN WITH MINERALS) TABS tablet Take 1 tablet by mouth daily. Centrum Silver     Multiple Vitamins-Minerals (PRESERVISION AREDS 2) CAPS Take 1 capsule by mouth in the morning and at bedtime.     nitroGLYCERIN (NITROSTAT) 0.4 MG SL tablet ONE TABLET  UNDER TONGUE WHEN NEEDED FOR CHEST PAIN. MAY REPEAT IN 5 MINUTES. 25 tablet 6   pantoprazole (PROTONIX) 40 MG tablet Take 1 tablet (40 mg total) by mouth daily. 90 tablet 3   Polyvinyl Alcohol-Povidone PF (REFRESH) 1.4-0.6 % SOLN Place 1-2 drops into both eyes 3 (three) times daily as needed (dry/irritated eyes.).     Probiotic Product (PROBIOTIC-10 PO) Take 1 capsule by mouth in the morning.     RESTASIS 0.05 % ophthalmic emulsion Place 1 drop into both eyes 2 (two) times daily as needed (dry/irritated eyes.).     rosuvastatin (CRESTOR) 5 MG tablet TAKE 1 TABLET ONCE DAILY. 90 tablet 0   Saw Palmetto 160 MG CAPS Take 160 mg by mouth 2 (two) times daily.  tamsulosin (FLOMAX) 0.4 MG CAPS capsule Take 0.4 mg by mouth in the morning and at bedtime.     traMADol (ULTRAM) 50 MG tablet Take 50 mg by mouth 3 (three) times daily as needed (back pain.).     traZODone (DESYREL) 50 MG tablet Take 25 mg by mouth at bedtime as needed for sleep.     Turmeric (QC TUMERIC COMPLEX) 500 MG CAPS Take 500 mg by mouth in the morning.     zolpidem (AMBIEN) 10 MG tablet Take 10 mg by mouth at bedtime.     No current facility-administered medications for this encounter.   Wt Readings from Last 3 Encounters:  08/28/23 76.8 kg (169 lb 6.4 oz)  07/23/23 77.3 kg (170 lb 6.7 oz)  07/17/23 75.3 kg (166 lb)   BP (!) 138/90   Pulse 60   Wt 76.8 kg (169 lb 6.4 oz)   SpO2 95%   BMI 24.31 kg/m  General: NAD Neck: No JVD, no thyromegaly or thyroid nodule.  Lungs: Clear to auscultation bilaterally with normal respiratory effort. CV: Nondisplaced PMI.  Heart regular S1/S2, no S3/S4, 1/6 SEM RUSB.  Trivial edema left ankle.  No carotid bruit.  Normal pedal pulses.  Abdomen: Soft, nontender, no hepatosplenomegaly, no distention.  Skin: Intact without lesions or rashes.  Neurologic: Alert and oriented x 3.  Psych: Normal affect. Extremities: No clubbing or cyanosis.  HEENT: Normal.   Assessment/Plan:  1. CAD: LHC in  8/24 with 95% mLAD in setting of exertional dyspnea.  He had DES to mLAD.   - Continue ASA 81 + Plavix 75.  - Continue Crestor. Check lipids today.  With elevated Lp(a), low threshold to refer to lipid clinic and start Repatha.  2. Aortic stenosis: Moderate AS. He was enrolled in PROGRESS CAP trial and is now s/p TAVR.  Post-TAVR echo in 10/24 with EF 60-65%, normal RV, mild MR, stable bioprosthetic aortic valve. Exertional dyspnea has resolved.  - Antibiotics needed with dental work.  3. Hyperlipidemia: Goal LDL < 55 with known CAD.  Myalgias with atorvastatin, now tolerating Crestor.  Lp(a) 96 in 8/24.  - Continue Crestor - Check lipids, low threshold for lipid clinic referral for Repatha give elevated Lp(a).  4. Ascending aortic aneurysm: Mild, 4.0 cm on 9/17 echo. MRA chest in 12/18 showed tortuous aorta without frank aneurysmal dilation.     Follow up in 3 months with APP.   Marca Ancona  08/29/2023

## 2023-09-02 ENCOUNTER — Encounter (HOSPITAL_COMMUNITY)
Admission: RE | Admit: 2023-09-02 | Discharge: 2023-09-02 | Disposition: A | Discharge status: Home / Self Care | Payer: Medicare Other | Source: Ambulatory Visit | Attending: Cardiology

## 2023-09-02 DIAGNOSIS — Z952 Presence of prosthetic heart valve: Secondary | ICD-10-CM

## 2023-09-02 DIAGNOSIS — Z955 Presence of coronary angioplasty implant and graft: Secondary | ICD-10-CM | POA: Diagnosis not present

## 2023-09-04 ENCOUNTER — Encounter (HOSPITAL_COMMUNITY)
Admission: RE | Admit: 2023-09-04 | Discharge: 2023-09-04 | Disposition: A | Discharge status: Home / Self Care | Payer: Medicare Other | Source: Ambulatory Visit | Attending: Cardiology | Admitting: Cardiology

## 2023-09-04 DIAGNOSIS — Z955 Presence of coronary angioplasty implant and graft: Secondary | ICD-10-CM | POA: Diagnosis not present

## 2023-09-04 DIAGNOSIS — Z952 Presence of prosthetic heart valve: Secondary | ICD-10-CM

## 2023-09-09 ENCOUNTER — Encounter (HOSPITAL_COMMUNITY): Payer: Medicare Other

## 2023-09-13 ENCOUNTER — Ambulatory Visit (HOSPITAL_COMMUNITY)
Admission: RE | Admit: 2023-09-13 | Discharge: 2023-09-13 | Disposition: A | Discharge status: Home / Self Care | Payer: Medicare Other | Source: Ambulatory Visit | Attending: Cardiology | Admitting: Cardiology

## 2023-09-13 DIAGNOSIS — I2510 Atherosclerotic heart disease of native coronary artery without angina pectoris: Secondary | ICD-10-CM | POA: Diagnosis not present

## 2023-09-13 LAB — LIPID PANEL
Cholesterol: 135 mg/dL (ref 0–200)
HDL: 53 mg/dL (ref >40)
LDL Cholesterol: 62 mg/dL (ref 0–99)
Total CHOL/HDL Ratio: 2.5 {ratio}
Triglycerides: 102 mg/dL (ref <150)
VLDL: 20 mg/dL (ref 0–40)

## 2023-09-16 ENCOUNTER — Encounter (HOSPITAL_COMMUNITY)
Admission: RE | Admit: 2023-09-16 | Discharge: 2023-09-16 | Disposition: A | Discharge status: Home / Self Care | Payer: Medicare Other | Source: Ambulatory Visit | Attending: Cardiology | Admitting: Cardiology

## 2023-09-16 DIAGNOSIS — Z955 Presence of coronary angioplasty implant and graft: Secondary | ICD-10-CM | POA: Diagnosis not present

## 2023-09-16 DIAGNOSIS — Z952 Presence of prosthetic heart valve: Secondary | ICD-10-CM

## 2023-09-23 ENCOUNTER — Telehealth (HOSPITAL_COMMUNITY): Payer: Self-pay | Admitting: *Deleted

## 2023-09-23 ENCOUNTER — Encounter (HOSPITAL_COMMUNITY): Payer: Medicare Other

## 2023-09-23 NOTE — Telephone Encounter (Signed)
 Spoke with [name]. Patient will need to be absent from cardiac rehab 10 days from positive date from COVID 19. Lady can return to cardiac rehab tentatively 10/02/23 if he is symptom free for 48 hours prior to his return. Advised the patient to follow up with his primary care provider Dr Charlott regarding his positive COVID 19 results. Patient states understanding.Hadassah Elpidio Quan RN BSN

## 2023-09-25 ENCOUNTER — Encounter (HOSPITAL_COMMUNITY): Payer: Medicare Other

## 2023-09-30 ENCOUNTER — Encounter (HOSPITAL_COMMUNITY): Payer: Medicare Other

## 2023-10-02 ENCOUNTER — Encounter (HOSPITAL_COMMUNITY): Payer: Medicare Other

## 2023-10-07 ENCOUNTER — Encounter (HOSPITAL_COMMUNITY)
Admission: RE | Admit: 2023-10-07 | Discharge: 2023-10-07 | Disposition: A | Discharge status: Home / Self Care | Payer: Medicare Other | Source: Ambulatory Visit | Attending: Cardiology | Admitting: Cardiology

## 2023-10-07 DIAGNOSIS — Z952 Presence of prosthetic heart valve: Secondary | ICD-10-CM | POA: Diagnosis not present

## 2023-10-09 ENCOUNTER — Encounter (HOSPITAL_COMMUNITY)
Admission: RE | Admit: 2023-10-09 | Discharge: 2023-10-09 | Disposition: A | Discharge status: Home / Self Care | Payer: Medicare Other | Source: Ambulatory Visit | Attending: Cardiology | Admitting: Cardiology

## 2023-10-09 DIAGNOSIS — Z952 Presence of prosthetic heart valve: Secondary | ICD-10-CM | POA: Diagnosis not present

## 2023-10-10 DIAGNOSIS — M47817 Spondylosis without myelopathy or radiculopathy, lumbosacral region: Secondary | ICD-10-CM | POA: Diagnosis not present

## 2023-10-14 ENCOUNTER — Encounter (HOSPITAL_COMMUNITY)
Admission: RE | Admit: 2023-10-14 | Discharge: 2023-10-14 | Disposition: A | Discharge status: Home / Self Care | Payer: Medicare Other | Source: Ambulatory Visit | Attending: Cardiology

## 2023-10-14 DIAGNOSIS — Z952 Presence of prosthetic heart valve: Secondary | ICD-10-CM | POA: Diagnosis not present

## 2023-10-16 ENCOUNTER — Encounter (HOSPITAL_COMMUNITY)
Admission: RE | Admit: 2023-10-16 | Discharge: 2023-10-16 | Disposition: A | Discharge status: Home / Self Care | Payer: Medicare Other | Source: Ambulatory Visit | Attending: Cardiology

## 2023-10-16 DIAGNOSIS — Z952 Presence of prosthetic heart valve: Secondary | ICD-10-CM | POA: Diagnosis not present

## 2023-10-18 ENCOUNTER — Encounter (HOSPITAL_COMMUNITY)
Admission: RE | Admit: 2023-10-18 | Discharge: 2023-10-18 | Disposition: A | Discharge status: Home / Self Care | Payer: Medicare Other | Source: Ambulatory Visit | Attending: Cardiology

## 2023-10-18 DIAGNOSIS — Z952 Presence of prosthetic heart valve: Secondary | ICD-10-CM | POA: Diagnosis not present

## 2023-10-18 NOTE — Progress Notes (Signed)
Cardiac Individual Treatment Plan  Patient Details  Name: Danny [name] MRN: [id number] Date of Birth: 03-30-1936 Referring Provider:   Flowsheet Row INTENSIVE CARDIAC REHAB ORIENT from 07/23/2023 in [facility]  Referring Provider Marca Ancona, MD       Initial Encounter Date:  Flowsheet Row INTENSIVE CARDIAC REHAB ORIENT from 07/23/2023 in [facility]  Date 07/23/23       Visit Diagnosis: 06/11/23 S/P TAVR (transcatheter aortic valve replacement)  Patient's Home Medications on Admission:  Current Outpatient Medications:    amoxicillin (AMOXIL) 500 MG capsule, Take 4 capsules (2,000 mg total) by mouth once as needed for up to 1 dose. 1 HOUR PRIOR TO DENTAL CLEANINGS, Disp: 4 capsule, Rfl: 4   aspirin EC 81 MG tablet, Take 81 mg by mouth in the morning., Disp: , Rfl:    betamethasone dipropionate 0.05 % cream, Apply 0.5 application  topically 2 (two) times daily as needed (dry/irritated skin)., Disp: , Rfl:    Calcium-Magnesium (CAL-MAG PO), Take 1 tablet by mouth in the morning. With Potassium 500 mg /500 mg /99 mg, Disp: , Rfl:    cholecalciferol (VITAMIN D3) 25 MCG (1000 UNIT) tablet, Take 1,000 Units by mouth daily., Disp: , Rfl:    clopidogrel (PLAVIX) 75 MG tablet, Take 1 tablet (75 mg total) by mouth daily with breakfast., Disp: 90 tablet, Rfl: 3   Coenzyme Q10 (CO Q 10 PO), Take 400 mg by mouth in the morning., Disp: , Rfl:    finasteride (PROSCAR) 5 MG tablet, Take 5 mg by mouth in the morning., Disp: , Rfl:    furosemide (LASIX) 20 MG tablet, Take 1 tablet (20 mg total) by mouth as needed (For weight gain of 3 lbs in 24 hours or 5 lbs in a week). For weight gain of 3 lbs in 24 hours or 5 lbs in a week, Disp: 30 tablet, Rfl: 0   gabapentin (NEURONTIN) 100 MG capsule, Take 1 capsule (100 mg total) by mouth daily as needed., Disp: 30 capsule, Rfl: 5    gabapentin (NEURONTIN) 600 MG tablet, TAKE 1 TABLET AT 4PM AND TAKE 1 TABLET AT BEDTIME., Disp: 180 tablet, Rfl: 3   Magnesium Oxide 400 MG CAPS, Take 1 capsule (400 mg total) by mouth daily., Disp: 30 capsule, Rfl: 11   metoprolol succinate (TOPROL-XL) 25 MG 24 hr tablet, TAKE ONE TABLET BY MOUTH DAILY, Disp: 30 tablet, Rfl: 3   Multiple Vitamin (MULTIVITAMIN WITH MINERALS) TABS tablet, Take 1 tablet by mouth daily. Centrum Silver, Disp: , Rfl:    Multiple Vitamins-Minerals (PRESERVISION AREDS 2) CAPS, Take 1 capsule by mouth in the morning and at bedtime., Disp: , Rfl:    nitroGLYCERIN (NITROSTAT) 0.4 MG SL tablet, ONE TABLET UNDER TONGUE WHEN NEEDED FOR CHEST PAIN. MAY REPEAT IN 5 MINUTES., Disp: 25 tablet, Rfl: 6   pantoprazole (PROTONIX) 40 MG tablet, Take 1 tablet (40 mg total) by mouth daily., Disp: 90 tablet, Rfl: 3   Polyvinyl Alcohol-Povidone PF (REFRESH) 1.4-0.6 % SOLN, Place 1-2 drops into both eyes 3 (three) times daily as needed (dry/irritated eyes.)., Disp: , Rfl:    Probiotic Product (PROBIOTIC-10 PO), Take 1 capsule by mouth in the morning., Disp: , Rfl:    RESTASIS 0.05 % ophthalmic emulsion, Place 1 drop into both eyes 2 (two) times daily as needed (dry/irritated eyes.)., Disp: , Rfl:    rosuvastatin (CRESTOR) 5 MG  tablet, TAKE 1 TABLET ONCE DAILY., Disp: 90 tablet, Rfl: 0   Saw Palmetto 160 MG CAPS, Take 160 mg by mouth 2 (two) times daily., Disp: , Rfl:    tamsulosin (FLOMAX) 0.4 MG CAPS capsule, Take 0.4 mg by mouth in the morning and at bedtime., Disp: , Rfl:    traMADol (ULTRAM) 50 MG tablet, Take 50 mg by mouth 3 (three) times daily as needed (back pain.)., Disp: , Rfl:    traZODone (DESYREL) 50 MG tablet, Take 25 mg by mouth at bedtime as needed for sleep., Disp: , Rfl:    Turmeric (QC TUMERIC COMPLEX) 500 MG CAPS, Take 500 mg by mouth in the morning., Disp: , Rfl:    zolpidem (AMBIEN) 10 MG tablet, Take 10 mg by mouth at bedtime., Disp: , Rfl:   Past Medical  History: Past Medical History:  Diagnosis Date   Allergic rhinitis    Arthritis    BPH (benign prostatic hypertrophy)    Dr. Laverle Patter   Cervical disc disease    Coronary artery disease    Patient developed exertional dyspnea. ETT-myoview (8/12) with 11 METS, normal LV EF, normal perfusion at rest and stress but elevated TID ratio. Coronary CT angiogram in 9/12 showed equivocal significant disease in the CFX. Cath in 10/12 showed EF 55-60%, 30% mid RCA, 40% distal RCA, 40% proximal LAD. ETT-Myoview (10/14): Diaphragmatic attenuation, no ischemia, EF 62%, normal study   Detached vitreous humor    Diverticulosis    GERD (gastroesophageal reflux disease)    Hemorrhoids    Impaired fasting glucose    Ingrown toenail    Lumbar radiculopathy    Numbness and tingling in left arm    Onychomycosis    Rotator cuff disorder    S/P TAVR (transcatheter aortic valve replacement) 06/11/2023   s/p TAVR with a 26 mm Edwards S3UR via the TF approach by Dr. 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    Tobacco Use: Social History   Tobacco Use  Smoking Status Never  Smokeless Tobacco Never    Labs: Review Flowsheet  More data exists      Latest Ref Rng & Units 08/09/2020 08/15/2021 08/23/2022 05/02/2023 09/13/2023  Labs for ITP Cardiac and Pulmonary Rehab  Cholestrol 0 - 200 mg/dL 604  540  981  - 191   LDL (calc) 0 - 99 mg/dL 68  45  56  - 62   HDL-C >40 mg/dL 68  60  59  - 53   Trlycerides <150 mg/dL 55  43  73  - 478   Bicarbonate 20.0 - 28.0 mmol/L - - - 24.9  24.3  -  TCO2 22 - 32 mmol/L - - - 26  26  -  Acid-base deficit 0.0 - 2.0 mmol/L - - - 1.0  1.0  -  O2 Saturation % - - - 72  69  -    Details       Multiple values from one day are sorted in reverse-chronological order         Capillary Blood Glucose: No results found for: "GLUCAP"   Exercise Target Goals: Exercise Program Goal: Individual exercise prescription set using results  from initial 6 min walk test and THRR while considering  patient's activity barriers and safety.   Exercise Prescription Goal: Initial exercise prescription builds to 30-45 minutes a day of aerobic activity, 2-3 days per week.  Home exercise guidelines will be given to patient during program  as part of exercise prescription that the participant will acknowledge.  Activity Barriers & Risk Stratification:  Activity Barriers & Cardiac Risk Stratification - 07/23/23 1255       Activity Barriers & Cardiac Risk Stratification   Activity Barriers Balance Concerns;Arthritis;Joint Problems;Back Problems;Deconditioning;Neck/Spine Problems;Other (comment)    Comments occasional lightheadedness upon standing    Cardiac Risk Stratification High             6 Minute Walk:  6 Minute Walk     Row Name 07/23/23 1253         6 Minute Walk   Phase Initial     Distance 1386 feet     Walk Time 6 minutes     # of Rest Breaks 0     MPH 2.63     METS 2.46     RPE 9     Perceived Dyspnea  0     VO2 Peak 8.32     Symptoms No     Resting HR 60 bpm     Resting BP 114/70     Resting Oxygen Saturation  96 %     Exercise Oxygen Saturation  during 6 min walk 98 %     Max Ex. HR 88 bpm     Max Ex. BP 134/72     2 Minute Post BP 120/70              Oxygen Initial Assessment:   Oxygen Re-Evaluation:   Oxygen Discharge (Final Oxygen Re-Evaluation):   Initial Exercise Prescription:  Initial Exercise Prescription - 07/23/23 1200       Date of Initial Exercise RX and Referring Provider   Date 07/23/23    Referring Provider Marca Ancona, MD    Expected Discharge Date 10/16/23      Recumbant Bike   Level 2    RPM 50    Watts 25    Minutes 15    METs 2.4      Arm Ergometer   Level 1.5    Watts 20    RPM 50    Minutes 15    METs 2.4      Prescription Details   Frequency (times per week) 3    Duration Progress to 30 minutes of continuous aerobic without signs/symptoms of  physical distress      Intensity   THRR 40-80% of Max Heartrate 54-108    Ratings of Perceived Exertion 11-13    Perceived Dyspnea 0-4      Progression   Progression Continue progressive overload as per policy without signs/symptoms or physical distress.      Resistance Training   Training Prescription Yes    Weight 3 lbs    Reps 10-15             Perform Capillary Blood Glucose checks as needed.  Exercise Prescription Changes:   Exercise Prescription Changes     Row Name 07/29/23 1651 08/14/23 1620 09/06/23 0832 10/09/23 1620       Response to Exercise   Blood Pressure (Admit) 138/74 120/82 134/80 120/64    Blood Pressure (Exercise) 138/78 128/72 -- 128/72    Blood Pressure (Exit) 128/76 128/78 124/80 104/72    Heart Rate (Admit) 64 bpm 64 bpm 69 bpm 54 bpm    Heart Rate (Exercise) 92 bpm 81 bpm 111 bpm 92 bpm    Heart Rate (Exit) 72 bpm 66 bpm 75 bpm 63 bpm    Rating of Perceived Exertion (Exercise) 10  11 12 12     Perceived Dyspnea (Exercise) 0 0 0 0    Symptoms none none none none    Comments Pt frist day in the [facility] ICR progam Reviewed MET's and goals Reviewed MET's and goals Reviewed MET's and goals    Duration Progress to 30 minutes of  aerobic without signs/symptoms of physical distress Progress to 30 minutes of  aerobic without signs/symptoms of physical distress Progress to 30 minutes of  aerobic without signs/symptoms of physical distress Progress to 30 minutes of  aerobic without signs/symptoms of physical distress    Intensity THRR unchanged THRR unchanged THRR unchanged THRR unchanged      Progression   Progression Continue to progress workloads to maintain intensity without signs/symptoms of physical distress. Continue to progress workloads to maintain intensity without signs/symptoms of physical distress. Continue to progress workloads to maintain intensity without signs/symptoms of physical distress. Continue to progress workloads to maintain intensity  without signs/symptoms of physical distress.    Average METs 2.05 2.1 2.7 2.65      Resistance Training   Training Prescription Yes No No No    Weight 3 lbs 3 lbs 3 lbs 3 lbs    Reps 10-15 10-15 10-15 10-15    Time 10 Minutes 10 Minutes 10 Minutes 10 Minutes      Recumbant Bike   Level 2 2 2 2     RPM 65 72 82 81    Watts 20 20 32 28    Minutes 15 15 15 15     METs 2.3 2.3 2.8 3      NuStep   Level -- -- 1 1    SPM -- -- 120 134    Minutes -- -- 15 15    METs -- -- 2.6 2.3      Arm Ergometer   Level 1.5 1.5 -- --    Watts 12 -- -- --    RPM 49 -- -- --    Minutes 15 15 -- --    METs 1.8 1.9 -- --             Exercise Comments:   Exercise Comments     Row Name 07/29/23 1655 08/14/23 1626 09/04/23 0836 09/04/23 1630 09/23/23 1048   Exercise Comments Pt first day in the [facility] ICR program. Pt tolerated exercise well with an average MET level of 2.05. Pt is learning his THRR, RPE and ExRx Reviewed MET's and goals. Pt tolerated exercise well with an average MET level of 2.1. Pt is feeling gopod about his goals and is already increasing stamina. He is continuing to work on his balance and when he feels more confident will add in golfing and home ExRx. Until then encouraged balance training in the [facility] book. Pt is also having issues with sleep, talked over resources to help with this. -- Reviewed MET's and goals. Pt tolerated exercise well with an average MET level of 2.7. Pt is progressing MET's and increasing strength and stamina. He says he is feeling more fit and active. He is not yet ready to retunr to golf, but is trying to build strength to feel confident when he is ready. Patient was absent for holiday, but now has COVID, will review education with patient when he is well and can return to cardiac rehab.    Row Name 10/07/23 1619 10/09/23 1625         Exercise Comments Patient returned for his first day of exercise since illness, DEC WL for  today to reacclimate.  Will review education soon. Patient tolerated exercise well and felt good. MET AVG today 2.6. Reviewed MET's and goals. Pt tolerated exercise well with an average MET level of 2.65. Pt has been out due to illness, but it doing very well since returning. He still feels increased fatigue and some mild brain fog. He states he is keeping up with his PCP about ongoing symptoms. But as far as exercise he is back at his previous WL and is doing nicely. Re-evaluated goals are to increase strength and stamina gradually.               Exercise Goals and Review:   Exercise Goals     Row Name 07/23/23 1256             Exercise Goals   Increase Physical Activity Yes       Intervention Provide advice, education, support and counseling about physical activity/exercise needs.;Develop an individualized exercise prescription for aerobic and resistive training based on initial evaluation findings, risk stratification, comorbidities and participant's personal goals.       Expected Outcomes Short Term: Attend rehab on a regular basis to increase amount of physical activity.;Long Term: Exercising regularly at least 3-5 days a week.;Long Term: Add in home exercise to make exercise part of routine and to increase amount of physical activity.       Increase Strength and Stamina Yes       Intervention Provide advice, education, support and counseling about physical activity/exercise needs.;Develop an individualized exercise prescription for aerobic and resistive training based on initial evaluation findings, risk stratification, comorbidities and participant's personal goals.       Expected Outcomes Short Term: Increase workloads from initial exercise prescription for resistance, speed, and METs.;Short Term: Perform resistance training exercises routinely during rehab and add in resistance training at home;Long Term: Improve cardiorespiratory fitness, muscular endurance and strength as measured by increased METs and  functional capacity ( )       Able to understand and use rate of perceived exertion (RPE) scale Yes       Intervention Provide education and explanation on how to use RPE scale       Expected Outcomes Short Term: Able to use RPE daily in rehab to express subjective intensity level;Long Term:  Able to use RPE to guide intensity level when exercising independently       Knowledge and understanding of Target Heart Rate Range (THRR) Yes       Intervention Provide education and explanation of THRR including how the numbers were predicted and where they are located for reference       Expected Outcomes Short Term: Able to state/look up THRR;Long Term: Able to use THRR to govern intensity when exercising independently;Short Term: Able to use daily as guideline for intensity in rehab       Understanding of Exercise Prescription Yes       Intervention Provide education, explanation, and written materials on patient's individual exercise prescription       Expected Outcomes Short Term: Able to explain program exercise prescription;Long Term: Able to explain home exercise prescription to exercise independently                Exercise Goals Re-Evaluation :  Exercise Goals Re-Evaluation     Row Name 07/29/23 1654 08/14/23 1623 09/04/23 1630 09/06/23 0834 10/09/23 1622     Exercise Goal Re-Evaluation   Exercise Goals Review Increase Physical Activity;Understanding of Exercise Prescription;Increase Strength and Stamina;Knowledge and  understanding of Target Heart Rate Range (THRR);Able to understand and use rate of perceived exertion (RPE) scale Increase Physical Activity;Understanding of Exercise Prescription;Increase Strength and Stamina;Knowledge and understanding of Target Heart Rate Range (THRR);Able to understand and use rate of perceived exertion (RPE) scale Increase Physical Activity;Understanding of Exercise Prescription;Increase Strength and Stamina;Knowledge and understanding of Target Heart  Rate Range (THRR);Able to understand and use rate of perceived exertion (RPE) scale -- Increase Physical Activity;Understanding of Exercise Prescription;Increase Strength and Stamina;Knowledge and understanding of Target Heart Rate Range (THRR);Able to understand and use rate of perceived exertion (RPE) scale   Comments Pt first day in the [facility] ICR program. Pt tolerated exercise well with an average MET level of 2.05. Pt is learning his THRR, RPE and ExRx Reviewed MET's and goals. Pt tolerated exercise well with an average MET level of 2.1. Pt is feeling gopod about his goals and is already increasing stamina. He is continuing to work on his balance and when he feels more confident will add in golfing and home ExRx. Until then encouraged balance training in the Pritikin book. Pt is also having issues with sleep, talked over resources to help with this. Reviewed MET's and goals. Pt tolerated exercise well with an average MET level of 2.7. Pt is progressing MET's and increasing strength and stamina. He says he is feeling more fit and active. He is not yet ready to retunr to golf, but is trying to build strength to feel confident when he is ready. -- Reviewed MET's and goals. Pt tolerated exercise well with an average MET level of 2.65. Pt has been out due to illness, but it doing very well since returning. He still feels increased fatigue and some mild brain fog. He states he is keeping up with his PCP about ongoing symptoms. But as far as exercise he is back at his previous WL and is doing nicely. Re-evaluated goals are to increase strength and stamina gradually.   Expected Outcomes Will continue to monitor pt and progress workloads as tolerated without sign or symptom Will continue to monitor pt and progress workloads as tolerated without sign or symptom Will continue to monitor pt and progress workloads as tolerated without sign or symptom -- Will continue to monitor pt and progress workloads as tolerated  without sign or symptom            Discharge Exercise Prescription (Final Exercise Prescription Changes):  Exercise Prescription Changes - 10/09/23 1620       Response to Exercise   Blood Pressure (Admit) 120/64    Blood Pressure (Exercise) 128/72    Blood Pressure (Exit) 104/72    Heart Rate (Admit) 54 bpm    Heart Rate (Exercise) 92 bpm    Heart Rate (Exit) 63 bpm    Rating of Perceived Exertion (Exercise) 12    Perceived Dyspnea (Exercise) 0    Symptoms none    Comments Reviewed MET's and goals    Duration Progress to 30 minutes of  aerobic without signs/symptoms of physical distress    Intensity THRR unchanged      Progression   Progression Continue to progress workloads to maintain intensity without signs/symptoms of physical distress.    Average METs 2.65      Resistance Training   Training Prescription No    Weight 3 lbs    Reps 10-15    Time 10 Minutes      Recumbant Bike   Level 2    RPM 81    Watts  28    Minutes 15    METs 3      NuStep   Level 1    SPM 134    Minutes 15    METs 2.3             Nutrition:  Target Goals: Understanding of nutrition guidelines, daily intake of sodium 1500mg , cholesterol 200mg , calories 30% from fat and 7% or less from saturated fats, daily to have 5 or more servings of fruits and vegetables.  Biometrics:  Pre Biometrics - 07/23/23 1100       Pre Biometrics   Waist Circumference 43 inches    Hip Circumference 38 inches    Waist to Hip Ratio 1.13 %    Triceps Skinfold 7 mm    % Body Fat 25.2 %    Grip Strength 20 kg    Flexibility --   Not performed due to back/hip issues   Single Leg Stand 8.18 seconds              Nutrition Therapy Plan and Nutrition Goals:  Nutrition Therapy & Goals - 07/29/23 1603       Nutrition Therapy   Diet Heart Healthy Diet    Drug/Food Interactions Statins/Certain Fruits      Personal Nutrition Goals   Nutrition Goal Patient to identify strategies for reducing  cardiovascular risk by attending the [facility] education and nutrition series weekly.    Personal Goal #2 Patient to improve diet quality by using the plate method as a guide for meal planning to include lean protein/plant protein, fruits, vegetables, whole grains, nonfat dairy as part of a well-balanced diet.    Personal Goal #3 Patient to limit sodium intake to 2300mg  per day    Comments Patient with medical history of s/p TAVR, hyperlipidemia, CAD, aortic stenosis, s/p coronary artery stent placement. Ronny's LDL remains well controlled. Patient will benefit from participation in intensive cardiac rehab for nutrition, exercise, and lifestyle modification.      Intervention Plan   Intervention Prescribe, educate and counsel regarding individualized specific dietary modifications aiming towards targeted core components such as weight, hypertension, lipid management, diabetes, heart failure and other comorbidities.;Nutrition handout(s) given to patient.    Expected Outcomes Short Term Goal: Understand basic principles of dietary content, such as calories, fat, sodium, cholesterol and nutrients.;Long Term Goal: Adherence to prescribed nutrition plan.             Nutrition Assessments:  Nutrition Assessments - 08/13/23 1512       Rate Your Plate Scores   Pre Score 63            MEDIFICTS Score Key: >=70 Need to make dietary changes  40-70 Heart Healthy Diet <= 40 Therapeutic Level Cholesterol Diet   Flowsheet Row INTENSIVE CARDIAC REHAB from 08/12/2023 in [facility]  Picture Your Plate Total Score on Admission 63      Picture Your Plate Scores: <16 Unhealthy dietary pattern with much room for improvement. 41-50 Dietary pattern unlikely to meet recommendations for good health and room for improvement. 51-60 More healthful dietary pattern, with some room for improvement.  >60 Healthy dietary pattern, although there may  be some specific behaviors that could be improved.    Nutrition Goals Re-Evaluation:  Nutrition Goals Re-Evaluation     Row Name 07/29/23 1603             Goals   Current Weight 170 lb 6.7 oz (  77.3 kg)       Comment lipoproteinA WNL, lipids WNL, lDL 56       Expected Outcome Patient with medical history of s/p TAVR, hyperlipidemia, CAD, aortic stenosis, s/p coronary artery stent placement. Ronny's LDL remains well controlled. Patient will benefit from participation in intensive cardiac rehab for nutrition, exercise, and lifestyle modification.                Nutrition Goals Re-Evaluation:  Nutrition Goals Re-Evaluation     Row Name 07/29/23 1603             Goals   Current Weight 170 lb 6.7 oz (77.3 kg)       Comment lipoproteinA WNL, lipids WNL, lDL 56       Expected Outcome Patient with medical history of s/p TAVR, hyperlipidemia, CAD, aortic stenosis, s/p coronary artery stent placement. Ronny's LDL remains well controlled. Patient will benefit from participation in intensive cardiac rehab for nutrition, exercise, and lifestyle modification.                Nutrition Goals Discharge (Final Nutrition Goals Re-Evaluation):  Nutrition Goals Re-Evaluation - 07/29/23 1603       Goals   Current Weight 170 lb 6.7 oz (77.3 kg)    Comment lipoproteinA WNL, lipids WNL, lDL 56    Expected Outcome Patient with medical history of s/p TAVR, hyperlipidemia, CAD, aortic stenosis, s/p coronary artery stent placement. Ronny's LDL remains well controlled. Patient will benefit from participation in intensive cardiac rehab for nutrition, exercise, and lifestyle modification.             Psychosocial: Target Goals: Acknowledge presence or absence of significant depression and/or stress, maximize coping skills, provide positive support system. Participant is able to verbalize types and ability to use techniques and skills needed for reducing stress and depression.  Initial  Review & Psychosocial Screening:  Initial Psych Review & Screening - 07/23/23 1249       Initial Review   Current issues with Current Sleep Concerns      Family Dynamics   Good Support System? Yes   Pt has sposue for support     Barriers   Psychosocial barriers to participate in program There are no identifiable barriers or psychosocial needs.      Screening Interventions   Interventions Encouraged to exercise             Quality of Life Scores:  Quality of Life - 07/23/23 1251       Quality of Life   Select Quality of Life      Quality of Life Scores   Health/Function Pre 28.1 %    Socioeconomic Pre 30 %    Psych/Spiritual Pre 30 %    Family Pre 30 %    GLOBAL Pre 29.14 %            Scores of 19 and below usually indicate a poorer quality of life in these areas.  A difference of  2-3 points is a clinically meaningful difference.  A difference of 2-3 points in the total score of the Quality of Life Index has been associated with significant improvement in overall quality of life, self-image, physical symptoms, and general health in studies assessing change in quality of life.  PHQ-9: Review Flowsheet       07/23/2023 02/04/2020  Depression screen PHQ 2/9  Decreased Interest 0 0  Down, Depressed, Hopeless 0 0  PHQ - 2 Score 0 0  Altered sleeping  1 -  Tired, decreased energy 1 -  Change in appetite 0 -  Feeling bad or failure about yourself  0 -  Trouble concentrating 0 -  Moving slowly or fidgety/restless 0 -  Suicidal thoughts 0 -  PHQ-9 Score 2 -  Difficult doing work/chores Not difficult at all -   Interpretation of Total Score  Total Score Depression Severity:  1-4 = Minimal depression, 5-9 = Mild depression, 10-14 = Moderate depression, 15-19 = Moderately severe depression, 20-27 = Severe depression   Psychosocial Evaluation and Intervention:   Psychosocial Re-Evaluation:  Psychosocial Re-Evaluation     Row Name 07/30/23 0818 08/06/23 1637  08/23/23 0928 09/24/23 1204 10/18/23 1553     Psychosocial Re-Evaluation   Current issues with Current Sleep Concerns None Identified None Identified None Identified None Identified   Comments Ronny did not voice any concerns or stressors on his first day of exercise Ronny did not voice any increased concerns or stressors during exercise at cardiac rehab Richarda Overlie continues not voice any increased concerns or stressors during exercise at cardiac rehab Richarda Overlie is currently absent as he has COVID 19. Richarda Overlie has returned to cardiac rehab and will complete the program on 11/15/23.   Interventions Encouraged to attend Cardiac Rehabilitation for the exercise;Relaxation education Stress management education;Encouraged to attend Cardiac Rehabilitation for the exercise;Relaxation education Stress management education;Encouraged to attend Cardiac Rehabilitation for the exercise;Relaxation education Stress management education;Encouraged to attend Cardiac Rehabilitation for the exercise;Relaxation education Stress management education;Encouraged to attend Cardiac Rehabilitation for the exercise;Relaxation education   Continue Psychosocial Services  Follow up required by staff No Follow up required No Follow up required No Follow up required No Follow up required            Psychosocial Discharge (Final Psychosocial Re-Evaluation):  Psychosocial Re-Evaluation - 10/18/23 1553       Psychosocial Re-Evaluation   Current issues with None Identified    Comments Richarda Overlie has returned to cardiac rehab and will complete the program on 11/15/23.    Interventions Stress management education;Encouraged to attend Cardiac Rehabilitation for the exercise;Relaxation education    Continue Psychosocial Services  No Follow up required             Vocational Rehabilitation: Provide vocational rehab assistance to qualifying candidates.   Vocational Rehab Evaluation & Intervention:  Vocational Rehab - 07/23/23 1250        Initial Vocational Rehab Evaluation & Intervention   Assessment shows need for Vocational Rehabilitation No   Pt is retired            Education: Education Goals: Education classes will be provided on a weekly basis, covering required topics. Participant will state understanding/return demonstration of topics presented.    Education     Row Name 07/29/23 1700     Education   Cardiac Education Topics Pritikin   Geographical information systems officer Psychosocial   Psychosocial Workshop From Head to Heart: The Power of a Healthy Outlook   Instruction Review Code 1- Verbalizes Understanding   Class Start Time 1400   Class Stop Time 1450   Class Time Calculation (min) 50 min    Row Name 07/31/23 1500     Education   Cardiac Education Topics Pritikin   Customer service manager   Weekly Topic Adding Flavor - Sodium-Free   Instruction Review Code 1- Verbalizes Understanding   Class  Start Time 1400   Class Stop Time 1440   Class Time Calculation (min) 40 min    Row Name 08/05/23 1600     Education   Cardiac Education Topics Pritikin   Geographical information systems officer Exercise   Exercise Workshop Location manager and Fall Prevention   Instruction Review Code 1- Verbalizes Understanding   Class Start Time 1406   Class Stop Time 1500   Class Time Calculation (min) 54 min    Row Name 08/07/23 1500     Education   Cardiac Education Topics Pritikin   Customer service manager   Weekly Topic Fast and Healthy Breakfasts   Instruction Review Code 1- Verbalizes Understanding   Class Start Time 1355   Class Stop Time 1435   Class Time Calculation (min) 40 min    Row Name 08/12/23 1400     Education   Cardiac Education Topics Pritikin   Nurse, children's Exercise Physiologist   Select  Psychosocial   Psychosocial Healthy Minds, Bodies, Hearts   Instruction Review Code 1- Verbalizes Understanding   Class Start Time 1357   Class Stop Time 1429   Class Time Calculation (min) 32 min    Row Name 08/14/23 1400     Education   Cardiac Education Topics Pritikin   Nurse, children's Nurse   Select Nutrition   Nutrition Becoming a Pritikin Chef   Instruction Review Code 1- Verbalizes Understanding   Class Start Time 1356    Row Name 08/19/23 1400     Education   Cardiac Education Topics Pritikin   Select Core Videos     Core Videos   Educator Exercise Physiologist   Select Nutrition   Nutrition Other  Label Reading   Instruction Review Code 1- Verbalizes Understanding   Class Start Time 1356   Class Stop Time 1430   Class Time Calculation (min) 34 min    Row Name 08/21/23 1500     Education   Cardiac Education Topics Pritikin   Customer service manager   Weekly Topic Tasty Appetizers and Snacks   Instruction Review Code 1- Verbalizes Understanding   Class Start Time 1355   Class Stop Time 1440   Class Time Calculation (min) 45 min    Row Name 08/26/23 1500     Education   Cardiac Education Topics Pritikin   Select Workshops     Workshops   Educator Exercise Physiologist   Select Psychosocial   Psychosocial Workshop Recognizing and Reducing Stress   Instruction Review Code 1- Verbalizes Understanding   Class Start Time 1359   Class Stop Time 1445   Class Time Calculation (min) 46 min    Row Name 08/28/23 1500     Education   Cardiac Education Topics Pritikin   Customer service manager   Weekly Topic Efficiency Cooking - Meals in a Snap   Instruction Review Code 1- Verbalizes Understanding   Class Start Time 1358   Class Stop Time 1438   Class Time Calculation (min) 40 min    Row Name 09/02/23 1500     Workshops   Glass blower/designer Exercise  Exercise Workshop Exercise Basics: Diplomatic Services operational officer   Instruction Review Code 1- Verbalizes Understanding   Class Start Time 1400   Class Stop Time 1450   Class Time Calculation (min) 50 min    Row Name 09/04/23 1500     Education   Cardiac Education Topics Pritikin   Orthoptist   Educator Dietitian   Weekly Topic One-Pot Wonders   Instruction Review Code 1- Verbalizes Understanding   Class Start Time 1359   Class Stop Time 1436   Class Time Calculation (min) 37 min    Row Name 09/16/23 1500     Education   Cardiac Education Topics Pritikin   Geographical information systems officer Psychosocial   Psychosocial Workshop Focused Goals, Sustainable Changes   Instruction Review Code 1- Verbalizes Understanding   Class Start Time 1400   Class Stop Time 1440   Class Time Calculation (min) 40 min    Row Name 10/07/23 1400     Education   Cardiac Education Topics Pritikin   Geographical information systems officer Psychosocial   Psychosocial Workshop Healthy Sleep for a Healthy Heart   Instruction Review Code 1- Verbalizes Understanding   Class Start Time 1400   Class Stop Time 1445   Class Time Calculation (min) 45 min    Row Name 10/09/23 1400     Education   Cardiac Education Topics Pritikin   Customer service manager   Weekly Topic International Cuisine- Spotlight on the [employer] Zones   Instruction Review Code 1- Verbalizes Understanding   Class Start Time 1400   Class Stop Time 1441   Class Time Calculation (min) 41 min    Row Name 10/14/23 1500     Education   Cardiac Education Topics Pritikin   Select Workshops     Workshops   Educator Exercise Physiologist   Select Exercise   Exercise Workshop Managing Heart Disease: Your Path to a Healthier Heart   Instruction  Review Code 1- Verbalizes Understanding   Class Start Time 1355   Class Stop Time 1445   Class Time Calculation (min) 50 min    Row Name 10/16/23 1500     Education   Cardiac Education Topics Pritikin   Customer service manager   Weekly Topic Simple Sides and Sauces   Instruction Review Code 1- Verbalizes Understanding   Class Start Time 1400   Class Stop Time 1436   Class Time Calculation (min) 36 min    Row Name 10/21/23 1500     Education   Cardiac Education Topics Pritikin   Geographical information systems officer Psychosocial   Psychosocial Workshop From Head to Heart: The Power of a Healthy Outlook   Instruction Review Code 1- Verbalizes Understanding   Class Start Time 1400   Class Stop Time 1446   Class Time Calculation (min) 46 min            Core Videos: Exercise    Move It!  Clinical staff conducted group or individual video education with verbal and written material and guidebook.  Patient learns the recommended Pritikin exercise program. Exercise with the goal of living a long, healthy life. Some  of the health benefits of exercise include controlled diabetes, healthier blood pressure levels, improved cholesterol levels, improved heart and lung capacity, improved sleep, and better body composition. Everyone should speak with their doctor before starting or changing an exercise routine.  Biomechanical Limitations Clinical staff conducted group or individual video education with verbal and written material and guidebook.  Patient learns how biomechanical limitations can impact exercise and how we can mitigate and possibly overcome limitations to have an impactful and balanced exercise routine.  Body Composition Clinical staff conducted group or individual video education with verbal and written material and guidebook.  Patient learns that body composition (ratio of muscle mass to fat  mass) is a key component to assessing overall fitness, rather than body weight alone. Increased fat mass, especially visceral belly fat, can put [place] at increased risk for metabolic syndrome, type 2 diabetes, heart disease, and even death. It is recommended to combine diet and exercise (cardiovascular and resistance training) to improve your body composition. Seek guidance from your physician and exercise physiologist before implementing an exercise routine.  Exercise Action Plan Clinical staff conducted group or individual video education with verbal and written material and guidebook.  Patient learns the recommended strategies to achieve and enjoy long-term exercise adherence, including variety, self-motivation, self-efficacy, and positive decision making. Benefits of exercise include fitness, good health, weight management, more energy, better sleep, less stress, and overall well-being.  Medical   Heart Disease Risk Reduction Clinical staff conducted group or individual video education with verbal and written material and guidebook.  Patient learns our heart is our most vital organ as it circulates oxygen, nutrients, white blood cells, and hormones throughout the entire body, and carries waste away. Data supports a plant-based eating plan like the Pritikin Program for its effectiveness in slowing progression of and reversing heart disease. The video provides a number of recommendations to address heart disease.   Metabolic Syndrome and Belly Fat  Clinical staff conducted group or individual video education with verbal and written material and guidebook.  Patient learns what metabolic syndrome is, how it leads to heart disease, and how one can reverse it and keep it from coming back. You have metabolic syndrome if you have 3 of the following 5 criteria: abdominal obesity, high blood pressure, high triglycerides, low HDL cholesterol, and high blood sugar.  Hypertension and Heart Disease Clinical staff  conducted group or individual video education with verbal and written material and guidebook.  Patient learns that high blood pressure, or hypertension, is very common in the [place]. Hypertension is largely due to excessive salt intake, but other important risk factors include being overweight, physical inactivity, drinking too much alcohol, smoking, and not eating enough potassium from fruits and vegetables. High blood pressure is a leading risk factor for heart attack, stroke, congestive heart failure, dementia, kidney failure, and premature death. Long-term effects of excessive salt intake include stiffening of the arteries and thickening of heart muscle and organ damage. Recommendations include ways to reduce hypertension and the risk of heart disease.  Diseases of Our Time - Focusing on Diabetes Clinical staff conducted group or individual video education with verbal and written material and guidebook.  Patient learns why the best way to stop diseases of our time is prevention, through food and other lifestyle changes. Medicine (such as prescription pills and surgeries) is often only a Band-Aid on the problem, not a long-term solution. Most common diseases of our time include obesity, type 2 diabetes, hypertension, heart disease, and cancer.  The Pritikin Program is recommended and has been proven to help reduce, reverse, and/or prevent the damaging effects of metabolic syndrome.  Nutrition   Overview of the Pritikin Eating Plan  Clinical staff conducted group or individual video education with verbal and written material and guidebook.  Patient learns about the Pritikin Eating Plan for disease risk reduction. The Pritikin Eating Plan emphasizes a wide variety of unrefined, minimally-processed carbohydrates, like fruits, vegetables, whole grains, and legumes. Go, Caution, and Stop food choices are explained. Plant-based and lean animal proteins are emphasized. Rationale provided for low sodium  intake for blood pressure control, low added sugars for blood sugar stabilization, and low added fats and oils for coronary artery disease risk reduction and weight management.  Calorie Density  Clinical staff conducted group or individual video education with verbal and written material and guidebook.  Patient learns about calorie density and how it impacts the Pritikin Eating Plan. Knowing the characteristics of the food you choose will help you decide whether those foods will lead to weight gain or weight loss, and whether you want to consume more or less of them. Weight loss is usually a side effect of the Pritikin Eating Plan because of its focus on low calorie-dense foods.  Label Reading  Clinical staff conducted group or individual video education with verbal and written material and guidebook.  Patient learns about the Pritikin recommended label reading guidelines and corresponding recommendations regarding calorie density, added sugars, sodium content, and whole grains.  Dining Out - Part 1  Clinical staff conducted group or individual video education with verbal and written material and guidebook.  Patient learns that restaurant meals can be sabotaging because they can be so high in calories, fat, sodium, and/or sugar. Patient learns recommended strategies on how to positively address this and avoid unhealthy pitfalls.  Facts on Fats  Clinical staff conducted group or individual video education with verbal and written material and guidebook.  Patient learns that lifestyle modifications can be just as effective, if not more so, as many medications for lowering your risk of heart disease. A Pritikin lifestyle can help to reduce your risk of inflammation and atherosclerosis (cholesterol build-up, or plaque, in the artery walls). Lifestyle interventions such as dietary choices and physical activity address the cause of atherosclerosis. A review of the types of fats and their impact on blood  cholesterol levels, along with dietary recommendations to reduce fat intake is also included.  Nutrition Action Plan  Clinical staff conducted group or individual video education with verbal and written material and guidebook.  Patient learns how to incorporate Pritikin recommendations into their lifestyle. Recommendations include planning and keeping personal health goals in mind as an important part of their success.  Healthy Mind-Set    Healthy Minds, Bodies, Hearts  Clinical staff conducted group or individual video education with verbal and written material and guidebook.  Patient learns how to identify when they are stressed. Video will discuss the impact of that stress, as well as the many benefits of stress management. Patient will also be introduced to stress management techniques. The way we think, act, and feel has an impact on our hearts.  How Our Thoughts Can Heal Our Hearts  Clinical staff conducted group or individual video education with verbal and written material and guidebook.  Patient learns that negative thoughts can cause depression and anxiety. This can result in negative lifestyle behavior and serious health problems. Cognitive behavioral therapy is an effective method to help control our thoughts in  order to change and improve our emotional outlook.  Additional Videos:  Exercise    Improving Performance  Clinical staff conducted group or individual video education with verbal and written material and guidebook.  Patient learns to use a non-linear approach by alternating intensity levels and lengths of time spent exercising to help burn more calories and lose more body fat. Cardiovascular exercise helps improve heart health, metabolism, hormonal balance, blood sugar control, and recovery from fatigue. Resistance training improves strength, endurance, balance, coordination, reaction time, metabolism, and muscle mass. Flexibility exercise improves circulation, posture, and  balance. Seek guidance from your physician and exercise physiologist before implementing an exercise routine and learn your capabilities and proper form for all exercise.  Introduction to Yoga  Clinical staff conducted group or individual video education with verbal and written material and guidebook.  Patient learns about yoga, a discipline of the coming together of mind, breath, and body. The benefits of yoga include improved flexibility, improved range of motion, better posture and core strength, increased lung function, weight loss, and positive self-image. Yoga's heart health benefits include lowered blood pressure, healthier heart rate, decreased cholesterol and triglyceride levels, improved immune function, and reduced stress. Seek guidance from your physician and exercise physiologist before implementing an exercise routine and learn your capabilities and proper form for all exercise.  Medical   Aging: Enhancing Your Quality of Life  Clinical staff conducted group or individual video education with verbal and written material and guidebook.  Patient learns key strategies and recommendations to stay in good physical health and enhance quality of life, such as prevention strategies, having an advocate, securing a Health Care Proxy and Power of Attorney, and keeping a list of medications and system for tracking them. It also discusses how to avoid risk for bone loss.  Biology of Weight Control  Clinical staff conducted group or individual video education with verbal and written material and guidebook.  Patient learns that weight gain occurs because we consume more calories than we burn (eating more, moving less). Even if your body weight is normal, you may have higher ratios of fat compared to muscle mass. Too much body fat puts you at increased risk for cardiovascular disease, heart attack, stroke, type 2 diabetes, and obesity-related cancers. In addition to exercise, following the Pritikin Eating  Plan can help reduce your risk.  Decoding Lab Results  Clinical staff conducted group or individual video education with verbal and written material and guidebook.  Patient learns that lab test reflects one measurement whose values change over time and are influenced by many factors, including medication, stress, sleep, exercise, food, hydration, pre-existing medical conditions, and more. It is recommended to use the knowledge from this video to become more involved with your lab results and evaluate your numbers to speak with your doctor.   Diseases of Our Time - Overview  Clinical staff conducted group or individual video education with verbal and written material and guidebook.  Patient learns that according to the CDC, 50% to 70% of chronic diseases (such as obesity, type 2 diabetes, elevated lipids, hypertension, and heart disease) are avoidable through lifestyle improvements including healthier food choices, listening to satiety cues, and increased physical activity.  Sleep Disorders Clinical staff conducted group or individual video education with verbal and written material and guidebook.  Patient learns how good quality and duration of sleep are important to overall health and well-being. Patient also learns about sleep disorders and how they impact health along with recommendations to address them,  including discussing with a physician.  Nutrition  Dining Out - Part 2 Clinical staff conducted group or individual video education with verbal and written material and guidebook.  Patient learns how to plan ahead and communicate in order to maximize their dining experience in a healthy and nutritious manner. Included are recommended food choices based on the type of restaurant the patient is visiting.   Fueling a Banker conducted group or individual video education with verbal and written material and guidebook.  There is a strong connection between our food choices  and our health. Diseases like obesity and type 2 diabetes are very prevalent and are in large-part due to lifestyle choices. The Pritikin Eating Plan provides plenty of food and hunger-curbing satisfaction. It is easy to follow, affordable, and helps reduce health risks.  Menu Workshop  Clinical staff conducted group or individual video education with verbal and written material and guidebook.  Patient learns that restaurant meals can sabotage health goals because they are often packed with calories, fat, sodium, and sugar. Recommendations include strategies to plan ahead and to communicate with the manager, chef, or server to help order a healthier meal.  Planning Your Eating Strategy  Clinical staff conducted group or individual video education with verbal and written material and guidebook.  Patient learns about the Pritikin Eating Plan and its benefit of reducing the risk of disease. The Pritikin Eating Plan does not focus on calories. Instead, it emphasizes high-quality, nutrient-rich foods. By knowing the characteristics of the foods, we choose, we can determine their calorie density and make informed decisions.  Targeting Your Nutrition Priorities  Clinical staff conducted group or individual video education with verbal and written material and guidebook.  Patient learns that lifestyle habits have a tremendous impact on disease risk and progression. This video provides eating and physical activity recommendations based on your personal health goals, such as reducing LDL cholesterol, losing weight, preventing or controlling type 2 diabetes, and reducing high blood pressure.  Vitamins and Minerals  Clinical staff conducted group or individual video education with verbal and written material and guidebook.  Patient learns different ways to obtain key vitamins and minerals, including through a recommended healthy diet. It is important to discuss all supplements you take with your  doctor.   Healthy Mind-Set    Smoking Cessation  Clinical staff conducted group or individual video education with verbal and written material and guidebook.  Patient learns that cigarette smoking and tobacco addiction pose a serious health risk which affects millions of people. Stopping smoking will significantly reduce the risk of heart disease, lung disease, and many forms of cancer. Recommended strategies for quitting are covered, including working with your doctor to develop a successful plan.  Culinary   Becoming a Set designer conducted group or individual video education with verbal and written material and guidebook.  Patient learns that cooking at home can be healthy, cost-effective, quick, and puts them in control. Keys to cooking healthy recipes will include looking at your recipe, assessing your equipment needs, planning ahead, making it simple, choosing cost-effective seasonal ingredients, and limiting the use of added fats, salts, and sugars.  Cooking - Breakfast and Snacks  Clinical staff conducted group or individual video education with verbal and written material and guidebook.  Patient learns how important breakfast is to satiety and nutrition through the entire day. Recommendations include key foods to eat during breakfast to help stabilize blood sugar levels and to prevent overeating at  meals later in the day. Planning ahead is also a key component.  Cooking - Educational psychologist conducted group or individual video education with verbal and written material and guidebook.  Patient learns eating strategies to improve overall health, including an approach to cook more at home. Recommendations include thinking of animal protein as a side on your plate rather than center stage and focusing instead on lower calorie dense options like vegetables, fruits, whole grains, and plant-based proteins, such as beans. Making sauces in large quantities to freeze  for later and leaving the skin on your vegetables are also recommended to maximize your experience.  Cooking - Healthy Salads and Dressing Clinical staff conducted group or individual video education with verbal and written material and guidebook.  Patient learns that vegetables, fruits, whole grains, and legumes are the foundations of the Pritikin Eating Plan. Recommendations include how to incorporate each of these in flavorful and healthy salads, and how to create homemade salad dressings. Proper handling of ingredients is also covered. Cooking - Soups and [employer] - Soups and Desserts Clinical staff conducted group or individual video education with verbal and written material and guidebook.  Patient learns that Pritikin soups and desserts make for easy, nutritious, and delicious snacks and meal components that are low in sodium, fat, sugar, and calorie density, while high in vitamins, minerals, and filling fiber. Recommendations include simple and healthy ideas for soups and desserts.   Overview     The Pritikin Solution Program Overview Clinical staff conducted group or individual video education with verbal and written material and guidebook.  Patient learns that the results of the Pritikin Program have been documented in more than 100 articles published in peer-reviewed journals, and the benefits include reducing risk factors for (and, in some cases, even reversing) high cholesterol, high blood pressure, type 2 diabetes, obesity, and more! An overview of the three key pillars of the Pritikin Program will be covered: eating well, doing regular exercise, and having a healthy mind-set.  WORKSHOPS  Exercise: Exercise Basics: Building Your Action Plan Clinical staff led group instruction and group discussion with PowerPoint presentation and patient guidebook. To enhance the learning environment the use of posters, models and videos may be added. At the conclusion of this workshop,  patients will comprehend the difference between physical activity and exercise, as well as the benefits of incorporating both, into their routine. Patients will understand the FITT (Frequency, Intensity, Time, and Type) principle and how to use it to build an exercise action plan. In addition, safety concerns and other considerations for exercise and cardiac rehab will be addressed by the presenter. The purpose of this lesson is to promote a comprehensive and effective weekly exercise routine in order to improve patients' overall level of fitness.   Managing Heart Disease: Your Path to a Healthier Heart Clinical staff led group instruction and group discussion with PowerPoint presentation and patient guidebook. To enhance the learning environment the use of posters, models and videos may be added.At the conclusion of this workshop, patients will understand the anatomy and physiology of the heart. Additionally, they will understand how Pritikin's three pillars impact the risk factors, the progression, and the management of heart disease.  The purpose of this lesson is to provide a high-level overview of the heart, heart disease, and how the Pritikin lifestyle positively impacts risk factors.  Exercise Biomechanics Clinical staff led group instruction and group discussion with PowerPoint presentation and patient guidebook. To enhance the learning  environment the use of posters, models and videos may be added. Patients will learn how the structural parts of their bodies function and how these functions impact their daily activities, movement, and exercise. Patients will learn how to promote a neutral spine, learn how to manage pain, and identify ways to improve their physical movement in order to promote healthy living. The purpose of this lesson is to expose patients to common physical limitations that impact physical activity. Participants will learn practical ways to adapt and manage aches and  pains, and to minimize their effect on regular exercise. Patients will learn how to maintain good posture while sitting, walking, and lifting.  Balance Training and Fall Prevention  Clinical staff led group instruction and group discussion with PowerPoint presentation and patient guidebook. To enhance the learning environment the use of posters, models and videos may be added. At the conclusion of this workshop, patients will understand the importance of their sensorimotor skills (vision, proprioception, and the vestibular system) in maintaining their ability to balance as they age. Patients will apply a variety of balancing exercises that are appropriate for their current level of function. Patients will understand the common causes for poor balance, possible solutions to these problems, and ways to modify their physical environment in order to minimize their fall risk. The purpose of this lesson is to teach patients about the importance of maintaining balance as they age and ways to minimize their risk of falling.  WORKSHOPS   Nutrition:  Fueling a Ship broker led group instruction and group discussion with PowerPoint presentation and patient guidebook. To enhance the learning environment the use of posters, models and videos may be added. Patients will review the foundational principles of the Pritikin Eating Plan and understand what constitutes a serving size in each of the food groups. Patients will also learn Pritikin-friendly foods that are better choices when away from home and review make-ahead meal and snack options. Calorie density will be reviewed and applied to three nutrition priorities: weight maintenance, weight loss, and weight gain. The purpose of this lesson is to reinforce (in a group setting) the key concepts around what patients are recommended to eat and how to apply these guidelines when away from home by planning and selecting Pritikin-friendly options.  Patients will understand how calorie density may be adjusted for different weight management goals.  Mindful Eating  Clinical staff led group instruction and group discussion with PowerPoint presentation and patient guidebook. To enhance the learning environment the use of posters, models and videos may be added. Patients will briefly review the concepts of the Pritikin Eating Plan and the importance of low-calorie dense foods. The concept of mindful eating will be introduced as well as the importance of paying attention to internal hunger signals. Triggers for non-hunger eating and techniques for dealing with triggers will be explored. The purpose of this lesson is to provide patients with the opportunity to review the basic principles of the Pritikin Eating Plan, discuss the value of eating mindfully and how to measure internal cues of hunger and fullness using the Hunger Scale. Patients will also discuss reasons for non-hunger eating and learn strategies to use for controlling emotional eating.  Targeting Your Nutrition Priorities Clinical staff led group instruction and group discussion with PowerPoint presentation and patient guidebook. To enhance the learning environment the use of posters, models and videos may be added. Patients will learn how to determine their genetic susceptibility to disease by reviewing their family history. Patients will gain  insight into the importance of diet as part of an overall healthy lifestyle in mitigating the impact of genetics and other environmental insults. The purpose of this lesson is to provide patients with the opportunity to assess their personal nutrition priorities by looking at their family history, their own health history and current risk factors. Patients will also be able to discuss ways of prioritizing and modifying the Pritikin Eating Plan for their highest risk areas  Menu  Clinical staff led group instruction and group discussion with PowerPoint  presentation and patient guidebook. To enhance the learning environment the use of posters, models and videos may be added. Using menus brought in from [employer], or printed from [employer], patients will apply the Pritikin dining out guidelines that were presented in the [employer] video. Patients will also be able to practice these guidelines in a variety of provided scenarios. The purpose of this lesson is to provide patients with the opportunity to practice hands-on learning of the Pritikin Dining Out guidelines with actual menus and practice scenarios.  Label Reading Clinical staff led group instruction and group discussion with PowerPoint presentation and patient guidebook. To enhance the learning environment the use of posters, models and videos may be added. Patients will review and discuss the Pritikin label reading guidelines presented in Pritikin's Label Reading Educational series video. Using fool labels brought in from local grocery stores and markets, patients will apply the label reading guidelines and determine if the packaged food meet the Pritikin guidelines. The purpose of this lesson is to provide patients with the opportunity to review, discuss, and practice hands-on learning of the Pritikin Label Reading guidelines with actual packaged food labels. Cooking School  Pritikin's [employer] are designed to teach patients ways to prepare quick, simple, and affordable recipes at home. The importance of nutrition's role in chronic disease risk reduction is reflected in its emphasis in the overall Pritikin program. By learning how to prepare essential core Pritikin Eating Plan recipes, patients will increase control over what they eat; be able to customize the flavor of foods without the use of added salt, sugar, or fat; and improve the quality of the food they consume. By learning a set of core recipes which are easily assembled, quickly prepared, and  affordable, patients are more likely to prepare more healthy foods at home. These workshops focus on convenient breakfasts, simple entres, side dishes, and desserts which can be prepared with minimal effort and are consistent with nutrition recommendations for cardiovascular risk reduction. Cooking [employer] are taught by a Armed forces logistics/support/administrative officer (RD) who has been trained by the [employer]. The chef or RD has a clear understanding of the importance of minimizing - if not completely eliminating - added fat, sugar, and sodium in recipes. Throughout the series of Cooking School Workshop sessions, patients will learn about healthy ingredients and efficient methods of cooking to build confidence in their capability to prepare    Cooking School weekly topics:  Adding Flavor- Sodium-Free  Fast and Healthy Breakfasts  Powerhouse Plant-Based Proteins  Satisfying Salads and Dressings  Simple Sides and Sauces  International Cuisine-Spotlight on the [employer] Zones  Delicious Desserts  Savory Soups  [employer] - Meals in a Astronomer Appetizers and Snacks  Comforting Weekend Breakfasts  One-Pot Wonders   Fast Evening Meals  Landscape architect Your Pritikin Plate  WORKSHOPS   Healthy Mindset (Psychosocial):  Focused Goals, Sustainable Changes Clinical staff led group  instruction and group discussion with PowerPoint presentation and patient guidebook. To enhance the learning environment the use of posters, models and videos may be added. Patients will be able to apply effective goal setting strategies to establish at least one personal goal, and then take consistent, meaningful action toward that goal. They will learn to identify common barriers to achieving personal goals and develop strategies to overcome them. Patients will also gain an understanding of how our mind-set can impact our ability to achieve goals and the importance of cultivating a positive  and growth-oriented mind-set. The purpose of this lesson is to provide patients with a deeper understanding of how to set and achieve personal goals, as well as the tools and strategies needed to overcome common obstacles which may arise along the way.  From Head to Heart: The Power of a Healthy Outlook  Clinical staff led group instruction and group discussion with PowerPoint presentation and patient guidebook. To enhance the learning environment the use of posters, models and videos may be added. Patients will be able to recognize and describe the impact of emotions and mood on physical health. They will discover the importance of self-care and explore self-care practices which may work for them. Patients will also learn how to utilize the 4 C's to cultivate a healthier outlook and better manage stress and challenges. The purpose of this lesson is to demonstrate to patients how a healthy outlook is an essential part of maintaining good health, especially as they continue their cardiac rehab journey.  Healthy Sleep for a Healthy Heart Clinical staff led group instruction and group discussion with PowerPoint presentation and patient guidebook. To enhance the learning environment the use of posters, models and videos may be added. At the conclusion of this workshop, patients will be able to demonstrate knowledge of the importance of sleep to overall health, well-being, and quality of life. They will understand the symptoms of, and treatments for, common sleep disorders. Patients will also be able to identify daytime and nighttime behaviors which impact sleep, and they will be able to apply these tools to help manage sleep-related challenges. The purpose of this lesson is to provide patients with a general overview of sleep and outline the importance of quality sleep. Patients will learn about a few of the most common sleep disorders. Patients will also be introduced to the concept of "sleep hygiene," and  discover ways to self-manage certain sleeping problems through simple daily behavior changes. Finally, the workshop will motivate patients by clarifying the links between quality sleep and their goals of heart-healthy living.   Recognizing and Reducing Stress Clinical staff led group instruction and group discussion with PowerPoint presentation and patient guidebook. To enhance the learning environment the use of posters, models and videos may be added. At the conclusion of this workshop, patients will be able to understand the types of stress reactions, differentiate between acute and chronic stress, and recognize the impact that chronic stress has on their health. They will also be able to apply different coping mechanisms, such as reframing negative self-talk. Patients will have the opportunity to practice a variety of stress management techniques, such as deep abdominal breathing, progressive muscle relaxation, and/or guided imagery.  The purpose of this lesson is to educate patients on the role of stress in their lives and to provide healthy techniques for coping with it.  Learning Barriers/Preferences:  Learning Barriers/Preferences - 07/23/23 1250       Learning Barriers/Preferences   Learning Barriers Sight;Hearing   pt  wears glasses and bilateral hearing aids   Learning Preferences Audio;Computer/Internet;Group Instruction;Individual Instruction;Pictoral;Skilled Demonstration;Verbal Instruction;Video;Written Material             Education Topics:  Knowledge Questionnaire Score:  Knowledge Questionnaire Score - 07/23/23 1252       Knowledge Questionnaire Score   Pre Score 20/24             Core Components/Risk Factors/Patient Goals at Admission:  Personal Goals and Risk Factors at Admission - 07/23/23 1250       Core Components/Risk Factors/Patient Goals on Admission   Hypertension Yes    Intervention Provide education on lifestyle modifcations including regular  physical activity/exercise, weight management, moderate sodium restriction and increased consumption of fresh fruit, vegetables, and low fat dairy, alcohol moderation, and smoking cessation.;Monitor prescription use compliance.    Expected Outcomes Short Term: Continued assessment and intervention until BP is < 140/62mm HG in hypertensive participants. < 130/39mm HG in hypertensive participants with diabetes, heart failure or chronic kidney disease.;Long Term: Maintenance of blood pressure at goal levels.    Lipids Yes    Intervention Provide education and support for participant on nutrition & aerobic/resistive exercise along with prescribed medications to achieve LDL 70mg , HDL >40mg .    Expected Outcomes Short Term: Participant states understanding of desired cholesterol values and is compliant with medications prescribed. Participant is following exercise prescription and nutrition guidelines.;Long Term: Cholesterol controlled with medications as prescribed, with individualized exercise RX and with personalized nutrition plan. Value goals: LDL < 70mg , HDL > 40 mg.             Core Components/Risk Factors/Patient Goals Review:   Goals and Risk Factor Review     Row Name 07/30/23 0819 08/06/23 1638 08/23/23 0930 09/24/23 1206 10/18/23 1555     Core Components/Risk Factors/Patient Goals Review   Personal Goals Review Weight Management/Obesity;Hypertension;Lipids Weight Management/Obesity;Hypertension;Lipids Weight Management/Obesity;Hypertension;Lipids Weight Management/Obesity;Hypertension;Lipids Weight Management/Obesity;Hypertension;Lipids   Review Ronny started cardiac rehab on 07/29/23. Ronny did well with exercise. Vital signs were stable. Ronny started cardiac rehab on 07/29/23. Richarda Overlie is off to a good start to exercise. Vital signs have been  stable. Richarda Overlie is doing well with exercise at cardiac rehab. . Vital signs have been  stable. Richarda Overlie has lost 3.4 kg since starting cardiac rehab.  Richarda Overlie is doing well with exercise at cardiac rehab. . Vital signs have been  stable. Richarda Overlie is currently absent as he has COVID 19. Ronny plans to return to exercise on 10/02/23. Richarda Overlie is doing well with exercise at cardiac rehab. . Vital signs have been  stable. Richarda Overlie will complete cardiac rehab on 11/15/23.   Expected Outcomes Richarda Overlie will continue to participate in cardiac rehab for exercise nutrition and lifestyle modifications. Richarda Overlie will continue to participate in cardiac rehab for exercise nutrition and lifestyle modifications. Richarda Overlie will continue to participate in cardiac rehab for exercise nutrition and lifestyle modifications. Richarda Overlie will continue to participate in cardiac rehab for exercise nutrition and lifestyle modifications. Richarda Overlie will continue to participate in cardiac rehab for exercise nutrition and lifestyle modifications.            Core Components/Risk Factors/Patient Goals at Discharge (Final Review):   Goals and Risk Factor Review - 10/18/23 1555       Core Components/Risk Factors/Patient Goals Review   Personal Goals Review Weight Management/Obesity;Hypertension;Lipids    Review Richarda Overlie is doing well with exercise at cardiac rehab. . Vital signs have been  stable. Richarda Overlie will complete cardiac rehab on 11/15/23.    Expected  Outcomes Richarda Overlie will continue to participate in cardiac rehab for exercise nutrition and lifestyle modifications.             ITP Comments:  ITP Comments     Row Name 07/30/23 0816 08/06/23 1636 08/23/23 0926 09/24/23 1201 10/18/23 1553   ITP Comments 30 Day ITP Review. Ronny started cardiac rehab on 07/29/23. Ronny did well with exercise. 30 Day ITP Review. Ronny started cardiac rehab on 07/29/23. Richarda Overlie is off to a good start to exercise. 30 Day ITP Review. Richarda Overlie has good attendance and participation with  exercise at cardiac rehab. 30 Day ITP Review. Richarda Overlie has good attendance and participation with  exercise at cardiac rehab. Richarda Overlie is currently absent  as he has COVID 19 30 Day ITP Review. Richarda Overlie has good attendance and participation with exercise at cardiac rehab  since his return post COVID 19 infection.            Comments: See ITP comments.Thayer Headings RN BSN

## 2023-10-21 ENCOUNTER — Encounter (HOSPITAL_COMMUNITY)
Admission: RE | Admit: 2023-10-21 | Discharge: 2023-10-21 | Disposition: A | Discharge status: Home / Self Care | Payer: Medicare Other | Source: Ambulatory Visit | Attending: Cardiology | Admitting: Cardiology

## 2023-10-21 DIAGNOSIS — Z952 Presence of prosthetic heart valve: Secondary | ICD-10-CM | POA: Diagnosis not present

## 2023-10-21 DIAGNOSIS — Z48812 Encounter for surgical aftercare following surgery on the circulatory system: Secondary | ICD-10-CM | POA: Diagnosis not present

## 2023-10-21 DIAGNOSIS — Z955 Presence of coronary angioplasty implant and graft: Secondary | ICD-10-CM | POA: Diagnosis not present

## 2023-10-22 DIAGNOSIS — I2510 Atherosclerotic heart disease of native coronary artery without angina pectoris: Secondary | ICD-10-CM | POA: Diagnosis not present

## 2023-10-22 DIAGNOSIS — D72829 Elevated white blood cell count, unspecified: Secondary | ICD-10-CM | POA: Diagnosis not present

## 2023-10-22 DIAGNOSIS — K219 Gastro-esophageal reflux disease without esophagitis: Secondary | ICD-10-CM | POA: Diagnosis not present

## 2023-10-22 DIAGNOSIS — H903 Sensorineural hearing loss, bilateral: Secondary | ICD-10-CM | POA: Diagnosis not present

## 2023-10-22 DIAGNOSIS — I350 Nonrheumatic aortic (valve) stenosis: Secondary | ICD-10-CM | POA: Diagnosis not present

## 2023-10-22 DIAGNOSIS — G629 Polyneuropathy, unspecified: Secondary | ICD-10-CM | POA: Diagnosis not present

## 2023-10-22 DIAGNOSIS — E7800 Pure hypercholesterolemia, unspecified: Secondary | ICD-10-CM | POA: Diagnosis not present

## 2023-10-22 DIAGNOSIS — U099 Post covid-19 condition, unspecified: Secondary | ICD-10-CM | POA: Diagnosis not present

## 2023-10-22 DIAGNOSIS — I7121 Aneurysm of the ascending aorta, without rupture: Secondary | ICD-10-CM | POA: Diagnosis not present

## 2023-10-22 DIAGNOSIS — D692 Other nonthrombocytopenic purpura: Secondary | ICD-10-CM | POA: Diagnosis not present

## 2023-10-22 DIAGNOSIS — R350 Frequency of micturition: Secondary | ICD-10-CM | POA: Diagnosis not present

## 2023-10-22 DIAGNOSIS — Z952 Presence of prosthetic heart valve: Secondary | ICD-10-CM | POA: Diagnosis not present

## 2023-10-23 ENCOUNTER — Encounter (HOSPITAL_COMMUNITY)
Admission: RE | Admit: 2023-10-23 | Discharge: 2023-10-23 | Discharge status: Home / Self Care | Payer: Medicare Other | Source: Ambulatory Visit | Attending: Cardiology

## 2023-10-23 DIAGNOSIS — Z955 Presence of coronary angioplasty implant and graft: Secondary | ICD-10-CM | POA: Diagnosis not present

## 2023-10-23 DIAGNOSIS — Z48812 Encounter for surgical aftercare following surgery on the circulatory system: Secondary | ICD-10-CM | POA: Diagnosis not present

## 2023-10-23 DIAGNOSIS — Z952 Presence of prosthetic heart valve: Secondary | ICD-10-CM

## 2023-10-23 NOTE — Progress Notes (Signed)
 CARDIAC REHAB PHASE 2  Reviewed home exercise with pt today. Pt is tolerating exercise well. He will continue to exercise on his own by walking and going to the community gym for 30-45 minutes per session 1-2 days a week in addition to the 3 days in [facility]. Advised pt on THRR, RPE scale, hydration and temperature/humidity precautions. Reinforced NTG use, S/S to stop exercise and when to call MD vs 911. Encouraged warm up cool down and stretches with exercise sessions. Pt verbalized understanding, all questions were answered and pt was given a copy to take home.    Danny [name] S Jaszmine Navejas ACSM-CEP 10/23/2023 4:20 PM

## 2023-10-25 ENCOUNTER — Encounter (HOSPITAL_COMMUNITY)
Admission: RE | Admit: 2023-10-25 | Discharge: 2023-10-25 | Disposition: A | Discharge status: Home / Self Care | Payer: Medicare Other | Source: Ambulatory Visit | Attending: Cardiology

## 2023-10-25 DIAGNOSIS — Z952 Presence of prosthetic heart valve: Secondary | ICD-10-CM | POA: Diagnosis not present

## 2023-10-25 DIAGNOSIS — Z48812 Encounter for surgical aftercare following surgery on the circulatory system: Secondary | ICD-10-CM | POA: Diagnosis not present

## 2023-10-25 DIAGNOSIS — Z955 Presence of coronary angioplasty implant and graft: Secondary | ICD-10-CM | POA: Diagnosis not present

## 2023-10-28 ENCOUNTER — Encounter (HOSPITAL_COMMUNITY)
Admission: RE | Admit: 2023-10-28 | Discharge: 2023-10-28 | Disposition: A | Discharge status: Home / Self Care | Payer: Medicare Other | Source: Ambulatory Visit | Attending: Cardiology | Admitting: Cardiology

## 2023-10-28 DIAGNOSIS — Z955 Presence of coronary angioplasty implant and graft: Secondary | ICD-10-CM | POA: Diagnosis not present

## 2023-10-28 DIAGNOSIS — Z48812 Encounter for surgical aftercare following surgery on the circulatory system: Secondary | ICD-10-CM | POA: Diagnosis not present

## 2023-10-28 DIAGNOSIS — Z952 Presence of prosthetic heart valve: Secondary | ICD-10-CM

## 2023-10-30 ENCOUNTER — Other Ambulatory Visit (HOSPITAL_COMMUNITY): Payer: Self-pay | Admitting: Cardiology

## 2023-10-30 ENCOUNTER — Encounter (HOSPITAL_COMMUNITY)
Admission: RE | Admit: 2023-10-30 | Discharge: 2023-10-30 | Disposition: A | Discharge status: Home / Self Care | Payer: Medicare Other | Source: Ambulatory Visit | Attending: Cardiology | Admitting: Cardiology

## 2023-10-30 DIAGNOSIS — Z952 Presence of prosthetic heart valve: Secondary | ICD-10-CM

## 2023-10-30 DIAGNOSIS — Z955 Presence of coronary angioplasty implant and graft: Secondary | ICD-10-CM | POA: Diagnosis not present

## 2023-10-30 DIAGNOSIS — Z48812 Encounter for surgical aftercare following surgery on the circulatory system: Secondary | ICD-10-CM | POA: Diagnosis not present

## 2023-11-01 ENCOUNTER — Encounter (HOSPITAL_COMMUNITY)
Admission: RE | Admit: 2023-11-01 | Discharge: 2023-11-01 | Disposition: A | Discharge status: Home / Self Care | Payer: Medicare Other | Source: Ambulatory Visit | Attending: Cardiology | Admitting: Cardiology

## 2023-11-01 DIAGNOSIS — Z952 Presence of prosthetic heart valve: Secondary | ICD-10-CM

## 2023-11-01 DIAGNOSIS — Z48812 Encounter for surgical aftercare following surgery on the circulatory system: Secondary | ICD-10-CM | POA: Diagnosis not present

## 2023-11-01 DIAGNOSIS — Z955 Presence of coronary angioplasty implant and graft: Secondary | ICD-10-CM | POA: Diagnosis not present

## 2023-11-04 ENCOUNTER — Encounter (HOSPITAL_COMMUNITY)
Admission: RE | Admit: 2023-11-04 | Discharge: 2023-11-04 | Disposition: A | Discharge status: Home / Self Care | Payer: Medicare Other | Source: Ambulatory Visit | Attending: Cardiology

## 2023-11-04 DIAGNOSIS — Z952 Presence of prosthetic heart valve: Secondary | ICD-10-CM

## 2023-11-04 DIAGNOSIS — Z48812 Encounter for surgical aftercare following surgery on the circulatory system: Secondary | ICD-10-CM | POA: Diagnosis not present

## 2023-11-04 DIAGNOSIS — Z955 Presence of coronary angioplasty implant and graft: Secondary | ICD-10-CM | POA: Diagnosis not present

## 2023-11-06 ENCOUNTER — Encounter (HOSPITAL_COMMUNITY): Payer: Medicare Other

## 2023-11-08 ENCOUNTER — Encounter (HOSPITAL_COMMUNITY)
Admission: RE | Admit: 2023-11-08 | Discharge: 2023-11-08 | Disposition: A | Discharge status: Home / Self Care | Payer: Medicare Other | Source: Ambulatory Visit | Attending: Cardiology | Admitting: Cardiology

## 2023-11-08 VITALS — Ht 70.0 in | Wt 175.0 lb

## 2023-11-08 DIAGNOSIS — Z952 Presence of prosthetic heart valve: Secondary | ICD-10-CM | POA: Diagnosis not present

## 2023-11-08 DIAGNOSIS — Z955 Presence of coronary angioplasty implant and graft: Secondary | ICD-10-CM | POA: Diagnosis not present

## 2023-11-08 DIAGNOSIS — Z48812 Encounter for surgical aftercare following surgery on the circulatory system: Secondary | ICD-10-CM | POA: Diagnosis not present

## 2023-11-11 ENCOUNTER — Encounter (HOSPITAL_COMMUNITY): Payer: Medicare Other

## 2023-11-13 ENCOUNTER — Encounter (HOSPITAL_COMMUNITY)
Admission: RE | Admit: 2023-11-13 | Discharge: 2023-11-13 | Disposition: A | Discharge status: Home / Self Care | Payer: Medicare Other | Source: Ambulatory Visit | Attending: Cardiology | Admitting: Cardiology

## 2023-11-13 DIAGNOSIS — Z952 Presence of prosthetic heart valve: Secondary | ICD-10-CM | POA: Diagnosis not present

## 2023-11-13 DIAGNOSIS — Z955 Presence of coronary angioplasty implant and graft: Secondary | ICD-10-CM | POA: Diagnosis not present

## 2023-11-13 DIAGNOSIS — Z48812 Encounter for surgical aftercare following surgery on the circulatory system: Secondary | ICD-10-CM | POA: Diagnosis not present

## 2023-11-15 ENCOUNTER — Encounter (HOSPITAL_COMMUNITY)
Admission: RE | Admit: 2023-11-15 | Discharge: 2023-11-15 | Disposition: A | Discharge status: Home / Self Care | Payer: Medicare Other | Source: Ambulatory Visit | Attending: Cardiology | Admitting: Cardiology

## 2023-11-15 DIAGNOSIS — Z48812 Encounter for surgical aftercare following surgery on the circulatory system: Secondary | ICD-10-CM | POA: Diagnosis not present

## 2023-11-15 DIAGNOSIS — Z952 Presence of prosthetic heart valve: Secondary | ICD-10-CM | POA: Diagnosis not present

## 2023-11-15 DIAGNOSIS — Z955 Presence of coronary angioplasty implant and graft: Secondary | ICD-10-CM | POA: Diagnosis not present

## 2023-11-15 NOTE — Progress Notes (Signed)
 Discharge Progress Report  Patient Details  Name: Danny [name] MRN: [id number] Date of Birth: 01/11/1936 Referring Provider:   Flowsheet Row INTENSIVE CARDIAC REHAB ORIENT from 07/23/2023 in [facility]  Referring Provider Marca Ancona, MD        Number of Visits: 59  Reason for Discharge:  Patient reached a stable level of exercise. Patient independent in their exercise. Patient has met program and personal goals.  Smoking History:  Social History   Tobacco Use  Smoking Status Never  Smokeless Tobacco Never    Diagnosis:  06/11/23 S/P TAVR (transcatheter aortic valve replacement)  ADL [facility]:   Initial Exercise Prescription:  Initial Exercise Prescription - 07/23/23 1200       Date of Initial Exercise RX and Referring Provider   Date 07/23/23    Referring Provider Marca Ancona, MD    Expected Discharge Date 10/16/23      Recumbant Bike   Level 2    RPM 50    Watts 25    Minutes 15    METs 2.4      Arm Ergometer   Level 1.5    Watts 20    RPM 50    Minutes 15    METs 2.4      Prescription Details   Frequency (times per week) 3    Duration Progress to 30 minutes of continuous aerobic without signs/symptoms of physical distress      Intensity   THRR 40-80% of Max Heartrate 54-108    Ratings of Perceived Exertion 11-13    Perceived Dyspnea 0-4      Progression   Progression Continue progressive overload as per policy without signs/symptoms or physical distress.      Resistance Training   Training Prescription Yes    Weight 3 lbs    Reps 10-15             Discharge Exercise Prescription (Final Exercise Prescription Changes):  Exercise Prescription Changes - 11/15/23 1622       Response to Exercise   Blood Pressure (Admit) 118/70    Blood Pressure (Exit) 136/80    Heart Rate (Admit) 63 bpm    Heart Rate (Exercise) 106 bpm    Heart Rate (Exit) 83 bpm    Rating of Perceived  Exertion (Exercise) 12    Perceived Dyspnea (Exercise) 0    Symptoms none    Comments Pt graduated the pritikin ICR    Duration Progress to 30 minutes of  aerobic without signs/symptoms of physical distress    Intensity THRR unchanged      Progression   Progression Continue to progress workloads to maintain intensity without signs/symptoms of physical distress.    Average METs 3.25      Resistance Training   Training Prescription Yes    Weight 3 lbs    Reps 10-15    Time 10 Minutes      Recumbant Bike   Level 3    RPM 74    Watts 43    Minutes 15    METs 3.7      NuStep   Level 2    SPM 130    Minutes 15    METs 3      Home Exercise Plan   Plans to continue exercise at [employer] (comment)    Frequency Add 2 additional days to program exercise sessions.    Initial Home Exercises Provided 10/23/23  Functional Capacity:  6 Minute Walk     Row Name 07/23/23 1253 11/08/23 1651       6 Minute Walk   Phase Initial Discharge    Distance 1386 feet 1540 feet    Distance % Change -- 11.11 %    Distance Feet Change -- 154 ft    Walk Time 6 minutes 6 minutes    # of Rest Breaks 0 0    MPH 2.63 2.92    METS 2.46 2.61    RPE 9 11    Perceived Dyspnea  0 0    VO2 Peak 8.32 9.12    Symptoms No No    Resting HR 60 bpm 65 bpm    Resting BP 114/70 120/72    Resting Oxygen Saturation  96 % --    Exercise Oxygen Saturation  during 6 min walk 98 % --    Max Ex. HR 88 bpm 91 bpm    Max Ex. BP 134/72 130/80    2 Minute Post BP 120/70 120/70             Psychological, QOL, Others - Outcomes: PHQ 2/9:    11/15/2023    1:57 PM 07/23/2023   12:47 PM 02/04/2020    7:53 AM  Depression screen PHQ 2/9  Decreased Interest 0 0 0  Down, Depressed, Hopeless 0 0 0  PHQ - 2 Score 0 0 0  Altered sleeping 0 1   Tired, decreased energy 0 1   Change in appetite 1 0   Feeling bad or failure about yourself  0 0   Trouble concentrating 1 0   Moving slowly  or fidgety/restless 0 0   Suicidal thoughts 0 0   PHQ-9 Score 2 2   Difficult doing work/chores Not difficult at all Not difficult at all     Quality of Life:  Quality of Life - 11/13/23 1425       Quality of Life Scores   Health/Function Post 27.2 %    Socioeconomic Post 28.21 %    Psych/Spiritual Post 30 %    Family Post 28.5 %    GLOBAL Post 28.17 %             Personal Goals: Goals established at orientation with interventions provided to work toward goal.  Personal Goals and Risk Factors at Admission - 07/23/23 1250       Core Components/Risk Factors/Patient Goals on Admission   Hypertension Yes    Intervention Provide education on lifestyle modifcations including regular physical activity/exercise, weight management, moderate sodium restriction and increased consumption of fresh fruit, vegetables, and low fat dairy, alcohol moderation, and smoking cessation.;Monitor prescription use compliance.    Expected Outcomes Short Term: Continued assessment and intervention until BP is < 140/3mm HG in hypertensive participants. < 130/40mm HG in hypertensive participants with diabetes, heart failure or chronic kidney disease.;Long Term: Maintenance of blood pressure at goal levels.    Lipids Yes    Intervention Provide education and support for participant on nutrition & aerobic/resistive exercise along with prescribed medications to achieve LDL 70mg , HDL >40mg .    Expected Outcomes Short Term: Participant states understanding of desired cholesterol values and is compliant with medications prescribed. Participant is following exercise prescription and nutrition guidelines.;Long Term: Cholesterol controlled with medications as prescribed, with individualized exercise RX and with personalized nutrition plan. Value goals: LDL < 70mg , HDL > 40 mg.  Personal Goals Discharge:  Goals and Risk Factor Review     Row Name 07/30/23 1610 08/06/23 1638 08/23/23 0930 09/24/23  1206 10/18/23 1555     Core Components/Risk Factors/Patient Goals Review   Personal Goals Review Weight Management/Obesity;Hypertension;Lipids Weight Management/Obesity;Hypertension;Lipids Weight Management/Obesity;Hypertension;Lipids Weight Management/Obesity;Hypertension;Lipids Weight Management/Obesity;Hypertension;Lipids   Review Danny [name] started cardiac rehab on 07/29/23. Danny [name] did well with exercise. Vital signs were stable. Danny [name] started cardiac rehab on 07/29/23. Danny [name] is off to a good start to exercise. Vital signs have been  stable. Danny [name] is doing well with exercise at cardiac rehab. . Vital signs have been  stable. Danny [name] has lost 3.4 kg since starting cardiac rehab. Danny [name] is doing well with exercise at cardiac rehab. . Vital signs have been  stable. Danny [name] is currently absent as he has COVID 19. Danny [name] plans to return to exercise on 10/02/23. Danny [name] is doing well with exercise at cardiac rehab. . Vital signs have been  stable. Danny [name] will complete cardiac rehab on 11/15/23.   Expected Outcomes Danny [name] will continue to participate in cardiac rehab for exercise nutrition and lifestyle modifications. Danny [name] will continue to participate in cardiac rehab for exercise nutrition and lifestyle modifications. Danny [name] will continue to participate in cardiac rehab for exercise nutrition and lifestyle modifications. Danny [name] will continue to participate in cardiac rehab for exercise nutrition and lifestyle modifications. Danny [name] will continue to participate in cardiac rehab for exercise nutrition and lifestyle modifications.            Exercise Goals and Review:  Exercise Goals     Row Name 07/23/23 1256             Exercise Goals   Increase Physical Activity Yes       Intervention Provide advice, education, support and counseling about physical activity/exercise needs.;Develop an individualized exercise prescription for aerobic and resistive training based on initial evaluation findings, risk stratification,  comorbidities and participant's personal goals.       Expected Outcomes Short Term: Attend rehab on a regular basis to increase amount of physical activity.;Long Term: Exercising regularly at least 3-5 days a week.;Long Term: Add in home exercise to make exercise part of routine and to increase amount of physical activity.       Increase Strength and Stamina Yes       Intervention Provide advice, education, support and counseling about physical activity/exercise needs.;Develop an individualized exercise prescription for aerobic and resistive training based on initial evaluation findings, risk stratification, comorbidities and participant's personal goals.       Expected Outcomes Short Term: Increase workloads from initial exercise prescription for resistance, speed, and METs.;Short Term: Perform resistance training exercises routinely during rehab and add in resistance training at home;Long Term: Improve cardiorespiratory fitness, muscular endurance and strength as measured by increased METs and functional capacity ( )       Able to understand and use rate of perceived exertion (RPE) scale Yes       Intervention Provide education and explanation on how to use RPE scale       Expected Outcomes Short Term: Able to use RPE daily in rehab to express subjective intensity level;Long Term:  Able to use RPE to guide intensity level when exercising independently       Knowledge and understanding of Target Heart Rate Range (THRR) Yes       Intervention Provide education and explanation of THRR including how the numbers were predicted and where they are located for reference       Expected Outcomes  Short Term: Able to state/look up THRR;Long Term: Able to use THRR to govern intensity when exercising independently;Short Term: Able to use daily as guideline for intensity in rehab       Understanding of Exercise Prescription Yes       Intervention Provide education, explanation, and written materials on patient's  individual exercise prescription       Expected Outcomes Short Term: Able to explain program exercise prescription;Long Term: Able to explain home exercise prescription to exercise independently                Exercise Goals Re-Evaluation:  Exercise Goals Re-Evaluation     Row Name 07/29/23 1654 08/14/23 1623 09/04/23 1630 09/06/23 0834 10/09/23 1622     Exercise Goal Re-Evaluation   Exercise Goals Review Increase Physical Activity;Understanding of Exercise Prescription;Increase Strength and Stamina;Knowledge and understanding of Target Heart Rate Range (THRR);Able to understand and use rate of perceived exertion (RPE) scale Increase Physical Activity;Understanding of Exercise Prescription;Increase Strength and Stamina;Knowledge and understanding of Target Heart Rate Range (THRR);Able to understand and use rate of perceived exertion (RPE) scale Increase Physical Activity;Understanding of Exercise Prescription;Increase Strength and Stamina;Knowledge and understanding of Target Heart Rate Range (THRR);Able to understand and use rate of perceived exertion (RPE) scale -- Increase Physical Activity;Understanding of Exercise Prescription;Increase Strength and Stamina;Knowledge and understanding of Target Heart Rate Range (THRR);Able to understand and use rate of perceived exertion (RPE) scale   Comments Pt first day in the [facility] ICR program. Pt tolerated exercise well with an average MET level of 2.05. Pt is learning his THRR, RPE and ExRx Reviewed MET's and goals. Pt tolerated exercise well with an average MET level of 2.1. Pt is feeling gopod about his goals and is already increasing stamina. He is continuing to work on his balance and when he feels more confident will add in golfing and home ExRx. Until then encouraged balance training in the [facility] book. Pt is also having issues with sleep, talked over resources to help with this. Reviewed MET's and goals. Pt tolerated exercise well with an  average MET level of 2.7. Pt is progressing MET's and increasing strength and stamina. He says he is feeling more fit and active. He is not yet ready to retunr to golf, but is trying to build strength to feel confident when he is ready. -- Reviewed MET's and goals. Pt tolerated exercise well with an average MET level of 2.65. Pt has been out due to illness, but it doing very well since returning. He still feels increased fatigue and some mild brain fog. He states he is keeping up with his PCP about ongoing symptoms. But as far as exercise he is back at his previous [facility] and is doing nicely. Re-evaluated goals are to increase strength and stamina gradually.   Expected Outcomes Will continue to monitor pt and progress workloads as tolerated without sign or symptom Will continue to monitor pt and progress workloads as tolerated without sign or symptom Will continue to monitor pt and progress workloads as tolerated without sign or symptom -- Will continue to monitor pt and progress workloads as tolerated without sign or symptom    Row Name 11/15/23 1626             Exercise Goal Re-Evaluation   Exercise Goals Review Increase Physical Activity;Understanding of Exercise Prescription;Increase Strength and Stamina;Knowledge and understanding of Target Heart Rate Range (THRR);Able to understand and use rate of perceived exertion (RPE) scale       Comments  Pt graduated the [employer]. Pt tolerated exercise well with an average MET level of 3.25. He did very well in the program and increased his by 154 ft for a total of 1574ft. He is also gaining more enduance and balance. He plans to exercise on his own by walking, golf and going to his community fitness center for 30-60 mins 4-5 days. Also gave some info for [employer] in case he was interested       Expected Outcomes Will continue to monitor pt and progress workloads as tolerated without sign or symptom                Nutrition & Weight -  Outcomes:  Pre Biometrics - 07/23/23 1100       Pre Biometrics   Waist Circumference 43 inches    Hip Circumference 38 inches    Waist to Hip Ratio 1.13 %    Triceps Skinfold 7 mm    % Body Fat 25.2 %    Grip Strength 20 kg    Flexibility --   Not performed due to back/hip issues   Single Leg Stand 8.18 seconds             Post Biometrics - 11/08/23 1652        Post  Biometrics   Height 5\' 10"  (1.778 m)    Weight 79.4 kg    Waist Circumference 43 inches    Hip Circumference 37 inches    Waist to Hip Ratio 1.16 %    BMI (Calculated) 25.12    Triceps Skinfold 8 mm    % Body Fat 25.9 %    Grip Strength 25 kg    Single Leg Stand 10 seconds             Nutrition:  Nutrition Therapy & Goals - 07/29/23 1603       Nutrition Therapy   Diet Heart Healthy Diet    Drug/Food Interactions Statins/Certain Fruits      Personal Nutrition Goals   Nutrition Goal Patient to identify strategies for reducing cardiovascular risk by attending the [facility] education and nutrition series weekly.    Personal Goal #2 Patient to improve diet quality by using the plate method as a guide for meal planning to include lean protein/plant protein, fruits, vegetables, whole grains, nonfat dairy as part of a well-balanced diet.    Personal Goal #3 Patient to limit sodium intake to 2300mg  per day    Comments Patient with medical history of s/p TAVR, hyperlipidemia, CAD, aortic stenosis, s/p coronary artery stent placement. Danny [name]'s LDL remains well controlled. Patient will benefit from participation in intensive cardiac rehab for nutrition, exercise, and lifestyle modification.      Intervention Plan   Intervention Prescribe, educate and counsel regarding individualized specific dietary modifications aiming towards targeted core components such as weight, hypertension, lipid management, diabetes, heart failure and other comorbidities.;Nutrition handout(s) given to patient.    Expected Outcomes  Short Term Goal: Understand basic principles of dietary content, such as calories, fat, sodium, cholesterol and nutrients.;Long Term Goal: Adherence to prescribed nutrition plan.             Nutrition Discharge:  Nutrition Assessments - 11/13/23 1423       Rate Your Plate Scores   Post Score 79             Education Questionnaire Score:  Knowledge Questionnaire Score - 11/13/23 1425       Knowledge Questionnaire Score  Post Score 24.24             Goals reviewed with patient; copy given to patient. Danny [name]  graduates from  Intensive/Traditional cardiac rehab program today on 11/15/23 completion of  58 exercise and education sessions. Pt maintained good attendance and progressed nicely during their participation in rehab as evidenced by increased MET level. Danny [name] increased his distance on his post exercise walk test by 154 feet.    Medication list reconciled. Repeat  PHQ score- 2 . Danny [name]  has made significant lifestyle changes and should be commended for his success.  Danny [name] achieved his goals during cardiac rehab.   Pt plans to continue exercise at home walking with his wife and may attend a gym/ fitness center  near his home. We are proud of Danny [name]'s progress!Thayer Headings RN BSN

## 2023-11-22 DIAGNOSIS — R1032 Left lower quadrant pain: Secondary | ICD-10-CM | POA: Diagnosis not present

## 2023-11-25 ENCOUNTER — Other Ambulatory Visit (HOSPITAL_COMMUNITY): Payer: Self-pay | Admitting: Family Medicine

## 2023-12-10 ENCOUNTER — Encounter (HOSPITAL_COMMUNITY): Payer: Self-pay | Admitting: Cardiology

## 2023-12-10 ENCOUNTER — Ambulatory Visit (HOSPITAL_COMMUNITY)
Admission: RE | Admit: 2023-12-10 | Discharge: 2023-12-10 | Disposition: A | Discharge status: Home / Self Care | Payer: Medicare Other | Source: Ambulatory Visit | Attending: Cardiology | Admitting: Cardiology

## 2023-12-10 VITALS — BP 130/80 | HR 56 | Wt 169.0 lb

## 2023-12-10 DIAGNOSIS — I2510 Atherosclerotic heart disease of native coronary artery without angina pectoris: Secondary | ICD-10-CM | POA: Insufficient documentation

## 2023-12-10 DIAGNOSIS — Z7982 Long term (current) use of aspirin: Secondary | ICD-10-CM | POA: Diagnosis not present

## 2023-12-10 DIAGNOSIS — Z7902 Long term (current) use of antithrombotics/antiplatelets: Secondary | ICD-10-CM | POA: Diagnosis not present

## 2023-12-10 DIAGNOSIS — M1711 Unilateral primary osteoarthritis, right knee: Secondary | ICD-10-CM | POA: Insufficient documentation

## 2023-12-10 DIAGNOSIS — I340 Nonrheumatic mitral (valve) insufficiency: Secondary | ICD-10-CM | POA: Insufficient documentation

## 2023-12-10 DIAGNOSIS — E785 Hyperlipidemia, unspecified: Secondary | ICD-10-CM | POA: Diagnosis not present

## 2023-12-10 DIAGNOSIS — I7121 Aneurysm of the ascending aorta, without rupture: Secondary | ICD-10-CM | POA: Insufficient documentation

## 2023-12-10 DIAGNOSIS — R5383 Other fatigue: Secondary | ICD-10-CM | POA: Insufficient documentation

## 2023-12-10 DIAGNOSIS — I5032 Chronic diastolic (congestive) heart failure: Secondary | ICD-10-CM

## 2023-12-10 DIAGNOSIS — Z953 Presence of xenogenic heart valve: Secondary | ICD-10-CM | POA: Diagnosis not present

## 2023-12-10 DIAGNOSIS — M79661 Pain in right lower leg: Secondary | ICD-10-CM

## 2023-12-10 LAB — BRAIN NATRIURETIC PEPTIDE: B Natriuretic Peptide: 102.9 pg/mL — ABNORMAL HIGH (ref 0.0–100.0)

## 2023-12-10 MED ORDER — ROSUVASTATIN CALCIUM 20 MG PO TABS: 20.0000 mg | ORAL_TABLET | Freq: Every day | ORAL | 3 refills | Status: DC

## 2023-12-10 NOTE — Progress Notes (Signed)
 Patient ID: Danny [name], male   DOB: 1936/03/16, 88 y.o.   MRN: [id number] PCP: Dr. Pete Glatter Cardiology: Dr. Shirlee Latch  Chief complaint: Fatigue  88 y.o. yo with history of nonobstructive CAD presents for followup of CAD and aortic stenosis.  He last had an ETT-Cardiolite in 6/16 that was normal.  Echo in 9/17 showed EF 55-60%, mild aortic stenosis, 4.0 cm ascending aorta.  MRA chest in 12/18 showed tortuous thoracic aorta without aneurysm. Echo in 11/19 showed EF 60-65%, mild AS/mild AI.  Echo in 12/21 showed EF 60-65% with mild AI, mild AS.  PYP scan in 12/21 was grade 1 with H/CL 1.15, probably negative for TTR cardiac amyloidosis.   Cardiolite in 1/23 showed EF 68%, no ischemia/infarction.   Patient has been doing well from a cardiac standpoint.  He has had no exertional dyspnea or chest pain.  He has right knee pain from OA, bothers him when he golfs.  No palpitations. SBP 110s-120s at home.   Echo 12/23 showed EF 60-65%, RV normal, moderate aortic stenosis, AVA 0.81 cm^2, AoV mean gradient 26 mmHg, peak gradient 41 mmHg.  Patient developed exertional dyspnea in 2024.  Workup showed moderate to moderate-severe aortic stenosis by echo.  LHC in 8/24 showed 95% mid LAD stenosis, treated with DES.  Patient had TAVR as part of the PROGRESS-CAP trial for TAVR in moderate AS.  Post-TAVR echo in 10/24 showed EF 60-65%, normal RV, mild MR, stable bioprosthetic aortic valve.   Patient reports that he fatigues easily.  He is not getting much exercise since he completed cardiac rehab.  He has golfed a couple of times this year so far.  No dyspnea with usual walking on flat ground.  Mild dyspnea walking up stairs. He has been getting swelling on and off in his left lower leg.  A few days ago, it was very puffy. He had a car drive recently to [facility]. Weight is stable.  No chest pain.   ECG (personally reviewed): NSR, 1st degree AVB.  Labs (8/14): K 3.9, creatinine 0.89 Labs (12/14): LDL 62, HDL  48   Labs (6/16): LDL 56 Labs (1/17): creatinine 0.88, HCT 43.9 Labs (8/17): K 4.3, creatinine 0.88, LFTs normal Labs (9/17): CK normal, Mg normal Labs (11/17): LDL 32, HDL 52 Labs (11/18): LDL 50 Labs (11/19): LDL 57, HDL 53, K 4, creatinine 0.82 Labs (3/21): LDL 53 Labs (4/21): K 3.8, creatinine 0.83, myeloma panel negative, urine immunofixation negative, LDL 68 Labs (11/22): LDL 45 Labs (12/23): K 4.3, creatinine 0.98, LDL 56 Labs (8/24): Lp(a) 96 Labs (10/24): K 4.6, creatinine 0.88 Labs (12/24): creatinine 0.89, LDL 62 Labs (2/25): Hgb 14.6  PMH: 1. Rotator cuff surgery 2. Diverticulosis 3. Chronic persistent hepatitis 4. Impaired fasting glucose 5. 6th nerve palsy in 2001 6. Allergic rhinitis 7. Low back pain s/p several operations.  Most recently had microdiscectomy in 1/17.  8. CAD: Patient developed exertional dyspnea.  ETT-myoview (8/12) with 11 METS, normal LV EF, normal perfusion at rest and stress but elevated TID ratio.  Coronary CT angiogram in 9/12 showed equivocal significant disease in the CFX.  Cath in 10/12 showed EF 55-60%, 30% mid RCA, 40% distal RCA, 40% proximal LAD.  ETT-Cardiolite in 10/14 showed EF 62%, probably normal study with diaphragmatic attenuation.   Echo (4/15) with EF 55-60%, mild MR.  - Cardiolite (6/16) with no ischemia/infarction.  - Echo (9/17) with EF 55-60%, mild aortic stenosis, 4.0 cm ascending aorta.  - Echo (11/19): EF 60-65%, mild  AS mean gradient 9 mmHg with AVA 1.53 cm^2, mild AI.  - Echo (12/21): EF 60-65%, mild AS, mild AI.  - Cardiolite (1/23): EF 68%, no ischemia/infarction. - LHC in 8/24 with 95% mLAD stenosis treated with DES.  9. GERD 10. Hyperlipidemia: Myalgias with atorvastatin.  11. Aortic stenosis: Moderate on 12/23 echo. With worsening exertional dyspnea, he was enrolled in the PROGRESS CAP trial and had TAVR.   - Post-TAVR echo in 10/24: EF 60-65%, normal RV, mild MR, stable bioprosthetic aortic valve.  12. Ascending  aorta dilation: MRA chest (12/18) showed a tortuous ascending aorta without aneurysmal dilation.  13. Peripheral neuropathy of uncertain etiology.  14. PYP scan (12/21): grade 1, H/CL 1.15 (probably negative)  FH: Father with CVA at 22  SH: Married, never smoked, rare ETOH. 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  ROS: All systems reviewed and negative except as per HPI.   Current Outpatient Medications  Medication Sig Dispense Refill   amoxicillin (AMOXIL) 500 MG capsule Take 4 capsules (2,000 mg total) by mouth once as needed for up to 1 dose. 1 HOUR PRIOR TO DENTAL CLEANINGS 4 capsule 4   aspirin EC 81 MG tablet Take 81 mg by mouth in the morning.     betamethasone dipropionate 0.05 % cream Apply 0.5 application  topically 2 (two) times daily as needed (dry/irritated skin).     Calcium-Magnesium (CAL-MAG PO) Take 1 tablet by mouth in the morning. With Potassium 500 mg /500 mg /99 mg     Cholecalciferol (VITAMIN D3) 250 MCG (10000 UT) capsule Take 10,000 Units by mouth in the morning, at noon, and at bedtime.     clopidogrel (PLAVIX) 75 MG tablet Take 1 tablet (75 mg total) by mouth daily with breakfast. 90 tablet 3   Coenzyme Q10 (CO Q 10 PO) Take 400 mg by mouth in the morning.     finasteride (PROSCAR) 5 MG tablet Take 5 mg by mouth in the morning.     furosemide (LASIX) 20 MG tablet Take 1 tablet (20 mg total) by mouth as needed (For weight gain of 3 lbs in 24 hours or 5 lbs in a week). 30 tablet 0   gabapentin (NEURONTIN) 100 MG capsule Take 1 capsule (100 mg total) by mouth daily as needed. 30 capsule 5   gabapentin (NEURONTIN) 600 MG tablet TAKE 1 TABLET AT 4PM AND TAKE 1 TABLET AT BEDTIME. 180 tablet 3   Magnesium Oxide 400 MG CAPS Take 1 capsule (400 mg total) by mouth daily. 30 capsule 11   metoprolol succinate (TOPROL-XL) 25 MG 24 hr tablet TAKE ONE TABLET BY MOUTH DAILY 30 tablet 3   Multiple Vitamin (MULTIVITAMIN WITH MINERALS) TABS tablet  Take 1 tablet by mouth daily. Centrum Silver     Multiple Vitamins-Minerals (PRESERVISION AREDS 2) CAPS Take 1 capsule by mouth in the morning and at bedtime.     nitroGLYCERIN (NITROSTAT) 0.4 MG SL tablet ONE TABLET UNDER TONGUE WHEN NEEDED FOR CHEST PAIN. MAY REPEAT IN 5 MINUTES. 25 tablet 6   pantoprazole (PROTONIX) 40 MG tablet Take 1 tablet (40 mg total) by mouth daily. 90 tablet 3   Probiotic Product (PROBIOTIC-10 PO) Take 1 capsule by mouth in the morning.     Propylene Glycol (SYSTANE BALANCE OP) Apply 1 drop to eye as needed.     rosuvastatin (CRESTOR) 20 MG tablet Take 1 tablet (20 mg total) by mouth daily. 90 tablet 3   Saw  Palmetto 160 MG CAPS Take 160 mg by mouth 2 (two) times daily.     traMADol (ULTRAM) 50 MG tablet Take 50 mg by mouth 3 (three) times daily as needed (back pain.).     traZODone (DESYREL) 50 MG tablet Take 25 mg by mouth at bedtime as needed for sleep.     Turmeric (QC TUMERIC COMPLEX) 500 MG CAPS Take 500 mg by mouth in the morning.     zolpidem (AMBIEN) 10 MG tablet Take 10 mg by mouth at bedtime.     No current facility-administered medications for this encounter.   Wt Readings from Last 3 Encounters:  12/10/23 76.7 kg (169 lb)  11/08/23 79.4 kg (175 lb 0.7 oz)  08/28/23 76.8 kg (169 lb 6.4 oz)   BP 130/80   Pulse (!) 56   Wt 76.7 kg (169 lb)   SpO2 98%   BMI 24.25 kg/m  General: NAD Neck: No JVD, no thyromegaly or thyroid nodule.  Lungs: Clear to auscultation bilaterally with normal respiratory effort. CV: Nondisplaced PMI.  Heart regular S1/S2, no S3/S4, 1/6 SEM RUSB.  No peripheral edema.  No carotid bruit.  Normal pedal pulses.  Abdomen: Soft, nontender, no hepatosplenomegaly, no distention.  Skin: Intact without lesions or rashes.  Neurologic: Alert and oriented x 3.  Psych: Normal affect. Extremities: No clubbing or cyanosis.  HEENT: Normal.   Assessment/Plan:  1. CAD: LHC in 8/24 with 95% mLAD in setting of exertional dyspnea.  He had DES  to mLAD.   - Continue ASA 81 + Plavix 75.  - He is off Crestor, we are not sure why. Restart Crestor at 20 mg daily with goal LDL < 55.   2. Aortic stenosis: Moderate AS. He was enrolled in PROGRESS CAP trial and is now s/p TAVR.  Post-TAVR echo in 10/24 with EF 60-65%, normal RV, mild MR, stable bioprosthetic aortic valve. Mild exertional dyspnea, more prominent fatigue.   - Antibiotics needed with dental work.  3. Hyperlipidemia: Goal LDL < 55 with known CAD.  Myalgias with atorvastatin, tolerated Crestor but off for uncertain reasons.  Lp(a) 96 in 8/24.  - Restart Crestor at 20 mg daily.  Lipids/LFTs in 2 months.  -Low threshold for lipid clinic referral for Repatha give elevated Lp(a).  4. Ascending aortic aneurysm: Mild, 4.0 cm on 9/17 echo. MRA chest in 12/18 showed tortuous aorta without frank aneurysmal dilation.    5. Left lower leg swelling: Periodic, but noted especially after a recent long car ride. Has now resolved.  - I will get venous dopplers of the left leg to rule out DVT.  6. Fatigue: He is not volume overloaded on exam.  Possibly due to deconditioning.  - I would like him to go to maintenance cardiac rehab.  - Check BNP.   Follow up in 6 months with APP.   I spent 31 minutes reviewing records, interviewing/examining patient, and managing orders.    Marca Ancona  12/10/2023

## 2023-12-10 NOTE — Patient Instructions (Signed)
 START Crestor 20 mg daily.  Labs done today, your results will be available in MyChart, we will contact you for abnormal readings.  Repeat blood work in 2 months.  You have been referred to cardiac rehab. They will call you to arrange your appointment.  Your provider has ordered a doppler of your lower leg. You will be called to have this test arranged.  Your physician recommends that you schedule a follow-up appointment in: 6 months (September) ** PLEASE CALL THE OFFICE IN JULY TO ARRANGE YOUR FOLLOW UP APPOINTMENT.**  If you have any questions or concerns before your next appointment please send [place] a message through [place] or call our office at [phone].    TO LEAVE A MESSAGE FOR THE NURSE SELECT OPTION 2, PLEASE LEAVE A MESSAGE INCLUDING: YOUR NAME DATE OF BIRTH CALL BACK NUMBER REASON FOR CALL**this is important as we prioritize the call backs  YOU WILL RECEIVE A CALL BACK THE SAME DAY AS LONG AS YOU CALL BEFORE 4:00 PM  At the [facility], you and your health needs are our priority. As part of our continuing mission to provide you with exceptional heart care, we have created designated Provider Care Teams. These Care Teams include your primary Cardiologist (physician) and Advanced Practice Providers (APPs- Physician Assistants and Nurse Practitioners) who all work together to provide you with the care you need, when you need it.   You may see any of the following providers on your designated Care Team at your next follow up: Dr Arvilla Meres Dr Marca Ancona Dr. Dorthula Nettles Dr. Clearnce Hasten Amy Filbert Schilder, NP Robbie Lis, [facility] [place], [place] Brynda Peon, NP [place] Lee, NP Clarisa Kindred, NP Karle Plumber, PharmD Enos Fling, PharmD   Please be sure to bring in all your medications bottles to every appointment.    Thank you for choosing [facility]

## 2023-12-16 ENCOUNTER — Other Ambulatory Visit: Payer: Self-pay | Admitting: Neurology

## 2023-12-16 ENCOUNTER — Other Ambulatory Visit (HOSPITAL_COMMUNITY): Payer: Self-pay | Admitting: *Deleted

## 2023-12-16 ENCOUNTER — Other Ambulatory Visit (HOSPITAL_COMMUNITY): Payer: Self-pay | Admitting: Cardiology

## 2023-12-16 ENCOUNTER — Encounter (HOSPITAL_COMMUNITY): Payer: Self-pay

## 2023-12-16 ENCOUNTER — Telehealth (HOSPITAL_COMMUNITY): Payer: Self-pay

## 2023-12-16 ENCOUNTER — Telehealth (HOSPITAL_COMMUNITY): Payer: Self-pay | Admitting: *Deleted

## 2023-12-16 DIAGNOSIS — I5032 Chronic diastolic (congestive) heart failure: Secondary | ICD-10-CM

## 2023-12-16 NOTE — Telephone Encounter (Signed)
 Called to speak to [facility] regarding the discontinuation of the cardiac rehab maintenance program.  Danny [name] was not available however his wife, Danny [name] who is listed on his DPR answered the phone.  Advise Danny [name] that we no longer have the maintenance program and upon review of his medical records he could participate in Pulmonary rehab with the diagnosis of diastolic heart failure with the agreement of Dr. Shirlee Latch.  General information on pulmonary rehab given to wife to relay to [name].  Contact information provided. To call back if interested in scheduling. Thanked me for calling. Alanson Aly, BSN Cardiac and Emergency planning/management officer

## 2023-12-16 NOTE — Progress Notes (Signed)
 Called patient to see if he was interested in participating in the Pulmonary Rehab Program. Patient will come in for orientation on 12/20/23 @1 :00 and will attend the 1:15 exercise class.  Pensions consultant.

## 2023-12-16 NOTE — Telephone Encounter (Signed)
 PT called back.  Would like to do pulmonary rehab.  Will forward request to Dr. Shirlee Latch. Alanson Aly, BSN Cardiac and Emergency planning/management officer

## 2023-12-16 NOTE — Progress Notes (Signed)
 Received referral from Dr. Shirlee Latch for this pt to participate in Pulmonary Rehab with the diagnosis of diastolic heart failure. Clinical review of pt follow up appt on 12/10/23 Cardiology office note. Pt appropriate for scheduling for Pulmonary rehab. Will forward to support staff for scheduling and verification of insurance eligibility/benefits with pt consent.   Guss Bunde Cardiac and Pulmonary Rehab

## 2023-12-16 NOTE — Telephone Encounter (Signed)
 Pt insurance is active and benefits verified through Medicare A/B. Co-pay $0.00, DED $257.00/$257.00 met, out of pocket $0.00/$0.00 met, co-insurance 20%. No pre-authorization required. 12/06/23 @ 4:21PM   2ndary insurance is active and benefits verified through [employer]. Co-pay $0.00, DED $0.00/$0.00 met, out of pocket $0.00/$0.00 met, co-insurance 0%. No pre-authorization required.

## 2023-12-17 ENCOUNTER — Telehealth (HOSPITAL_COMMUNITY): Payer: Self-pay

## 2023-12-17 DIAGNOSIS — Z85820 Personal history of malignant melanoma of skin: Secondary | ICD-10-CM | POA: Diagnosis not present

## 2023-12-17 DIAGNOSIS — L570 Actinic keratosis: Secondary | ICD-10-CM | POA: Diagnosis not present

## 2023-12-17 DIAGNOSIS — Z08 Encounter for follow-up examination after completed treatment for malignant neoplasm: Secondary | ICD-10-CM | POA: Diagnosis not present

## 2023-12-17 DIAGNOSIS — D225 Melanocytic nevi of trunk: Secondary | ICD-10-CM | POA: Diagnosis not present

## 2023-12-17 DIAGNOSIS — I788 Other diseases of capillaries: Secondary | ICD-10-CM | POA: Diagnosis not present

## 2023-12-17 DIAGNOSIS — L821 Other seborrheic keratosis: Secondary | ICD-10-CM | POA: Diagnosis not present

## 2023-12-17 DIAGNOSIS — L814 Other melanin hyperpigmentation: Secondary | ICD-10-CM | POA: Diagnosis not present

## 2023-12-17 NOTE — Telephone Encounter (Signed)
 Pt wife Danny [name] called and ask for you to give a call back on phone number on file. Stated she has questions about April 10.  Thanks, Shanda Bumps

## 2023-12-18 ENCOUNTER — Telehealth (HOSPITAL_COMMUNITY): Payer: Self-pay

## 2023-12-18 NOTE — Telephone Encounter (Signed)
 Pts wife called and asked if Danny [name] could start the PR program on 12/31/23 due to having another appt that day. Start date changed to 12/31/23.

## 2023-12-19 DIAGNOSIS — J00 Acute nasopharyngitis [common cold]: Secondary | ICD-10-CM | POA: Diagnosis not present

## 2023-12-20 ENCOUNTER — Encounter (HOSPITAL_COMMUNITY)
Admission: RE | Admit: 2023-12-20 | Discharge: 2023-12-20 | Disposition: A | Discharge status: Home / Self Care | Source: Ambulatory Visit | Attending: Cardiology | Admitting: Cardiology

## 2023-12-20 VITALS — BP 110/66 | HR 69 | Ht 70.0 in | Wt 173.7 lb

## 2023-12-20 DIAGNOSIS — Z952 Presence of prosthetic heart valve: Secondary | ICD-10-CM | POA: Diagnosis not present

## 2023-12-20 DIAGNOSIS — I5032 Chronic diastolic (congestive) heart failure: Secondary | ICD-10-CM | POA: Insufficient documentation

## 2023-12-20 NOTE — Progress Notes (Signed)
 Pulmonary Rehab Orientation Physical Assessment Note  Physical assessment reveals patient is alert and oriented x 4. Pt is HOH, despite wearing bil hearing aids. Heart rate is normal, breath sounds diminished to auscultation, no wheezes, rales, or rhonchi. Pt denies productive cough. Bowel sounds present x4 quads. Pt denies abdominal discomfort, nausea, vomiting, diarrhea or constipation. Grip strength equal, strong. Distal pulses +2; +1 BLE swelling, pt wearing compression stockings.   Essie Hart, RN, BSN

## 2023-12-20 NOTE — Progress Notes (Signed)
 Pulmonary Individual Treatment Plan  Patient Details  Name: Danny [name] MRN: [id number] Date of Birth: December 29, 1935 Referring Provider:   Doristine Devoid Pulmonary Rehab Walk Test from 12/20/2023 in [facility]  Referring Provider [facility]       Initial Encounter Date:  Flowsheet Row Pulmonary Rehab Walk Test from 12/20/2023 in [facility], [facility]  Date 12/20/23       Visit Diagnosis: Heart failure, diastolic, chronic (HCC)  Patient's Home Medications on Admission:   Current Outpatient Medications:    amoxicillin (AMOXIL) 500 MG capsule, Take 4 capsules (2,000 mg total) by mouth once as needed for up to 1 dose. 1 HOUR PRIOR TO DENTAL CLEANINGS, Disp: 4 capsule, Rfl: 4   aspirin EC 81 MG tablet, Take 81 mg by mouth in the morning., Disp: , Rfl:    betamethasone dipropionate 0.05 % cream, Apply 0.5 application  topically 2 (two) times daily as needed (dry/irritated skin)., Disp: , Rfl:    Calcium-Magnesium (CAL-MAG PO), Take 1 tablet by mouth in the morning. With Potassium 500 mg /500 mg /99 mg, Disp: , Rfl:    Cholecalciferol (VITAMIN D3) 250 MCG (10000 UT) capsule, Take 10,000 Units by mouth in the morning, at noon, and at bedtime., Disp: , Rfl:    clopidogrel (PLAVIX) 75 MG tablet, Take 1 tablet (75 mg total) by mouth daily with breakfast., Disp: 90 tablet, Rfl: 3   Coenzyme Q10 (CO Q 10 PO), Take 400 mg by mouth in the morning., Disp: , Rfl:    finasteride (PROSCAR) 5 MG tablet, Take 5 mg by mouth in the morning., Disp: , Rfl:    furosemide (LASIX) 20 MG tablet, Take 1 tablet (20 mg total) by mouth as needed (For weight gain of 3 lbs in 24 hours or 5 lbs in a week)., Disp: 30 tablet, Rfl: 0   gabapentin (NEURONTIN) 100 MG capsule, Take 1 capsule (100 mg total) by mouth daily as needed., Disp: 30 capsule, Rfl: 5   gabapentin (NEURONTIN) 600 MG tablet, TAKE 1 TABLET AT 4PM AND  TAKE 1 TABLET AT BEDTIME., Disp: 180 tablet, Rfl: 3   Magnesium Oxide 400 MG CAPS, Take 1 capsule (400 mg total) by mouth daily., Disp: 30 capsule, Rfl: 11   metoprolol succinate (TOPROL-XL) 25 MG 24 hr tablet, TAKE ONE TABLET BY MOUTH DAILY, Disp: 90 tablet, Rfl: 3   Multiple Vitamin (MULTIVITAMIN WITH MINERALS) TABS tablet, Take 1 tablet by mouth daily. Centrum Silver, Disp: , Rfl:    Multiple Vitamins-Minerals (PRESERVISION AREDS 2) CAPS, Take 1 capsule by mouth in the morning and at bedtime., Disp: , Rfl:    nitroGLYCERIN (NITROSTAT) 0.4 MG SL tablet, ONE TABLET UNDER TONGUE WHEN NEEDED FOR CHEST PAIN. MAY REPEAT IN 5 MINUTES., Disp: 25 tablet, Rfl: 6   pantoprazole (PROTONIX) 40 MG tablet, Take 1 tablet (40 mg total) by mouth daily., Disp: 90 tablet, Rfl: 3   Probiotic Product (PROBIOTIC-10 PO), Take 1 capsule by mouth in the morning., Disp: , Rfl:    Propylene Glycol (SYSTANE BALANCE OP), Apply 1 drop to eye as needed., Disp: , Rfl:    rosuvastatin (CRESTOR) 20 MG tablet, Take 1 tablet (20 mg total) by mouth daily., Disp: 90 tablet, Rfl: 3   Saw Palmetto 160 MG CAPS, Take 160 mg by mouth 2 (two) times daily., Disp: , Rfl:    traMADol (ULTRAM) 50 MG tablet, Take 50 mg  by mouth 3 (three) times daily as needed (back pain.)., Disp: , Rfl:    traZODone (DESYREL) 50 MG tablet, Take 25 mg by mouth at bedtime as needed for sleep., Disp: , Rfl:    Turmeric (QC TUMERIC COMPLEX) 500 MG CAPS, Take 500 mg by mouth in the morning., Disp: , Rfl:    zolpidem (AMBIEN) 10 MG tablet, Take 10 mg by mouth at bedtime., Disp: , Rfl:   Past Medical History: Past Medical History:  Diagnosis Date   Allergic rhinitis    Arthritis    BPH (benign prostatic hypertrophy)    Dr. Laverle Patter   Cervical disc disease    Coronary artery disease    Patient developed exertional dyspnea. ETT-myoview (8/12) with 11 METS, normal LV EF, normal perfusion at rest and stress but elevated TID ratio. Coronary CT angiogram in 9/12 showed  equivocal significant disease in the CFX. Cath in 10/12 showed EF 55-60%, 30% mid RCA, 40% distal RCA, 40% proximal LAD. ETT-Myoview (10/14): Diaphragmatic attenuation, no ischemia, EF 62%, normal study   Detached vitreous humor    Diverticulosis    GERD (gastroesophageal reflux disease)    Hemorrhoids    Impaired fasting glucose    Ingrown toenail    Lumbar radiculopathy    Numbness and tingling in left arm    Onychomycosis    Rotator cuff disorder    S/P TAVR (transcatheter aortic valve replacement) 06/11/2023   s/p TAVR with a 26 mm Edwards S3UR via the TF approach by Dr. 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    Tobacco Use: Social History   Tobacco Use  Smoking Status Never  Smokeless Tobacco Never    Labs: Review Flowsheet  More data exists      Latest Ref Rng & Units 08/09/2020 08/15/2021 08/23/2022 05/02/2023 09/13/2023  Labs for ITP Cardiac and Pulmonary Rehab  Cholestrol 0 - 200 mg/dL 409  811  914  - 782   LDL (calc) 0 - 99 mg/dL 68  45  56  - 62   HDL-C >40 mg/dL 68  60  59  - 53   Trlycerides <150 mg/dL 55  43  73  - 956   Bicarbonate 20.0 - 28.0 mmol/L - - - 24.9  24.3  -  TCO2 22 - 32 mmol/L - - - 26  26  -  Acid-base deficit 0.0 - 2.0 mmol/L - - - 1.0  1.0  -  O2 Saturation % - - - 72  69  -    Details       Multiple values from one day are sorted in reverse-chronological order         Capillary Blood Glucose: No results found for: "GLUCAP"   Pulmonary Assessment Scores:  Pulmonary Assessment Scores     Row Name 12/20/23 1424         ADL UCSD   ADL Phase Entry     SOB Score total 32       CAT Score   CAT Score 12       mMRC Score   mMRC Score 2             UCSD: Self-administered rating of dyspnea associated with activities of daily living (ADLs) 6-point scale (0 = "not at all" to 5 = "maximal or unable to do because of breathlessness")  Scoring Scores range from 0 to 120.  Minimally  important difference is 5 units  CAT: CAT  can identify the health impairment of COPD patients and is better correlated with disease progression.  CAT has a scoring range of zero to 40. The CAT score is classified into four groups of low (less than 10), medium (10 - 20), high (21-30) and very high (31-40) based on the impact level of disease on health status. A CAT score over 10 suggests significant symptoms.  A worsening CAT score could be explained by an exacerbation, poor medication adherence, poor inhaler technique, or progression of COPD or comorbid conditions.  CAT MCID is 2 points  mMRC: mMRC (Modified Medical Research Council) Dyspnea Scale is used to assess the degree of baseline functional disability in patients of respiratory disease due to dyspnea. No minimal important difference is established. A decrease in score of 1 point or greater is considered a positive change.   Pulmonary Function Assessment:  Pulmonary Function Assessment - 12/20/23 1320       Breath   Shortness of Breath Yes;Limiting activity             Exercise Target Goals: Exercise Program Goal: Individual exercise prescription set using results from initial 6 min walk test and THRR while considering  patient's activity barriers and safety.   Exercise Prescription Goal: Initial exercise prescription builds to 30-45 minutes a day of aerobic activity, 2-3 days per week.  Home exercise guidelines will be given to patient during program as part of exercise prescription that the participant will acknowledge.  Activity Barriers & Risk Stratification:  Activity Barriers & Cardiac Risk Stratification - 12/20/23 1315       Activity Barriers & Cardiac Risk Stratification   Activity Barriers Balance Concerns;Arthritis;Joint Problems;Back Problems;Deconditioning;Neck/Spine Problems             6 Minute Walk:  6 Minute Walk     Row Name 07/23/23 1253 11/08/23 1651 12/20/23 1409     6 Minute Walk   Phase  Initial Discharge Initial   Distance 1386 feet 1540 feet 1502 feet   Distance % Change -- 11.11 % --   Distance Feet Change -- 154 ft --   Walk Time 6 minutes 6 minutes 6 minutes   # of Rest Breaks 0 0 0   MPH 2.63 2.92 2.84   METS 2.46 2.61 2.45   RPE 9 11 11    Perceived Dyspnea  0 0 1   VO2 Peak 8.32 9.12 8.56   Symptoms No No No   Resting HR 60 bpm 65 bpm 69 bpm   Resting BP 114/70 120/72 110/66   Resting Oxygen Saturation  96 % -- 98 %   Exercise Oxygen Saturation  during 6 min walk 98 % -- 90 %   Max Ex. HR 88 bpm 91 bpm 87 bpm   Max Ex. BP 134/72 130/80 128/66   2 Minute Post BP 120/70 120/70 122/70     Interval HR   1 Minute HR -- -- 69   2 Minute HR -- -- 79   3 Minute HR -- -- 75   4 Minute HR -- -- 80   5 Minute HR -- -- 77   6 Minute HR -- -- 87   2 Minute Post HR -- -- 71   Interval Heart Rate? -- -- Yes     Interval Oxygen   Interval Oxygen? -- -- Yes   Baseline Oxygen Saturation % -- -- 98 %   1 Minute Oxygen Saturation % -- -- 93 %   1 Minute Liters of  Oxygen -- -- 0 L   2 Minute Oxygen Saturation % -- -- 91 %   2 Minute Liters of Oxygen -- -- 0 L   3 Minute Oxygen Saturation % -- -- 95 %   3 Minute Liters of Oxygen -- -- 0 L   4 Minute Oxygen Saturation % -- -- 90 %   4 Minute Liters of Oxygen -- -- 0 L   5 Minute Oxygen Saturation % -- -- 97 %   5 Minute Liters of Oxygen -- -- 0 L   6 Minute Oxygen Saturation % -- -- 90 %   6 Minute Liters of Oxygen -- -- 0 L   2 Minute Post Oxygen Saturation % -- -- 93 %   2 Minute Post Liters of Oxygen -- -- 0 L            Oxygen Initial Assessment:  Oxygen Initial Assessment - 12/20/23 1316       Home Oxygen   Home Oxygen Device None    Sleep Oxygen Prescription None    Home Exercise Oxygen Prescription None    Home Resting Oxygen Prescription None      Initial 6 min Walk   Oxygen Used None      Program Oxygen Prescription   Program Oxygen Prescription None      Intervention   Short Term Goals  To learn and understand importance of maintaining oxygen saturations>88%;To learn and demonstrate proper use of respiratory medications;To learn and understand importance of monitoring SPO2 with pulse oximeter and demonstrate accurate use of the pulse oximeter.;To learn and demonstrate proper pursed lip breathing techniques or other breathing techniques.     Long  Term Goals Verbalizes importance of monitoring SPO2 with pulse oximeter and return demonstration;Maintenance of O2 saturations>88%;Exhibits proper breathing techniques, such as pursed lip breathing or other method taught during program session;Compliance with respiratory medication;Demonstrates proper use of MDI's             Oxygen Re-Evaluation:   Oxygen Discharge (Final Oxygen Re-Evaluation):   Initial Exercise Prescription:  Initial Exercise Prescription - 12/20/23 1400       Date of Initial Exercise RX and Referring Provider   Date 12/20/23    Referring Provider [facility]    Expected Discharge Date 03/17/24      Recumbant Bike   Level 2    RPM 70    Watts 40    Minutes 15    METs 3.5      NuStep   Level 1    SPM 100    Minutes 15    METs 2.5      Intensity   THRR 40-80% of Max Heartrate 54-108    Ratings of Perceived Exertion 11-13    Perceived Dyspnea 0-4      Progression   Progression Continue progressive overload as per policy without signs/symptoms or physical distress.      Resistance Training   Training Prescription Yes    Weight blue bands    Reps 10-15             Perform Capillary Blood Glucose checks as needed.  Exercise Prescription Changes:   Exercise Prescription Changes     Row Name 07/29/23 1651 08/14/23 1620 09/06/23 0832 10/09/23 1620 10/23/23 1613     Response to Exercise   Blood Pressure (Admit) 138/74 120/82 134/80 120/64 120/70   Blood Pressure (Exercise) 138/78 128/72 -- 128/72 --   Blood Pressure (Exit) 128/76 128/78 124/80 104/72 108/72  Heart Rate (Admit) 64 bpm  64 bpm 69 bpm 54 bpm 55 bpm   Heart Rate (Exercise) 92 bpm 81 bpm 111 bpm 92 bpm 105 bpm   Heart Rate (Exit) 72 bpm 66 bpm 75 bpm 63 bpm 64 bpm   Rating of Perceived Exertion (Exercise) 10 11 12 12 12    Perceived Dyspnea (Exercise) 0 0 0 0 0   Symptoms none none none none none   Comments Pt frist day in the [facility] ICR progam Reviewed MET's and goals Reviewed MET's and goals Reviewed MET's and goals Reviewed MET's   Duration Progress to 30 minutes of  aerobic without signs/symptoms of physical distress Progress to 30 minutes of  aerobic without signs/symptoms of physical distress Progress to 30 minutes of  aerobic without signs/symptoms of physical distress Progress to 30 minutes of  aerobic without signs/symptoms of physical distress Progress to 30 minutes of  aerobic without signs/symptoms of physical distress   Intensity THRR unchanged THRR unchanged THRR unchanged THRR unchanged THRR unchanged     Progression   Progression Continue to progress workloads to maintain intensity without signs/symptoms of physical distress. Continue to progress workloads to maintain intensity without signs/symptoms of physical distress. Continue to progress workloads to maintain intensity without signs/symptoms of physical distress. Continue to progress workloads to maintain intensity without signs/symptoms of physical distress. Continue to progress workloads to maintain intensity without signs/symptoms of physical distress.   Average METs 2.05 2.1 2.7 2.65 2.9     Resistance Training   Training Prescription Yes No No No No   Weight 3 lbs 3 lbs 3 lbs 3 lbs 3 lbs   Reps 10-15 10-15 10-15 10-15 10-15   Time 10 Minutes 10 Minutes 10 Minutes 10 Minutes 10 Minutes     Recumbant Bike   Level 2 2 2 2 3    RPM 65 72 82 81 90   Watts 20 20 32 28 31   Minutes 15 15 15 15 15    METs 2.3 2.3 2.8 3 2.8     NuStep   Level -- -- 1 1 2    SPM -- -- 120 134 145   Minutes -- -- 15 15 15    METs -- -- 2.6 2.3 3     Arm  Ergometer   Level 1.5 1.5 -- -- --   Watts 12 -- -- -- --   RPM 49 -- -- -- --   Minutes 15 15 -- -- --   METs 1.8 1.9 -- -- --     Home Exercise Plan   Plans to continue exercise at -- -- -- -- [employer] (comment)   Frequency -- -- -- -- Add 2 additional days to program exercise sessions.   Initial Home Exercises Provided -- -- -- -- 10/23/23    Row Name 11/15/23 1622             Response to Exercise   Blood Pressure (Admit) 118/70       Blood Pressure (Exit) 136/80       Heart Rate (Admit) 63 bpm       Heart Rate (Exercise) 106 bpm       Heart Rate (Exit) 83 bpm       Rating of Perceived Exertion (Exercise) 12       Perceived Dyspnea (Exercise) 0       Symptoms none       Comments Pt graduated the [facility] ICR       Duration Progress to  30 minutes of  aerobic without signs/symptoms of physical distress       Intensity THRR unchanged         Progression   Progression Continue to progress workloads to maintain intensity without signs/symptoms of physical distress.       Average METs 3.25         Resistance Training   Training Prescription Yes       Weight 3 lbs       Reps 10-15       Time 10 Minutes         Recumbant Bike   Level 3       RPM 74       Watts 43       Minutes 15       METs 3.7         NuStep   Level 2       SPM 130       Minutes 15       METs 3         Home Exercise Plan   Plans to continue exercise at [employer] (comment)       Frequency Add 2 additional days to program exercise sessions.       Initial Home Exercises Provided 10/23/23                Exercise Comments:   Exercise Comments     Row Name 07/29/23 1655 08/14/23 1626 09/04/23 0836 09/04/23 1630 09/23/23 1048   Exercise Comments Pt first day in the [facility] ICR program. Pt tolerated exercise well with an average MET level of 2.05. Pt is learning his THRR, RPE and ExRx Reviewed MET's and goals. Pt tolerated exercise well with an average MET level of 2.1. Pt  is feeling gopod about his goals and is already increasing stamina. He is continuing to work on his balance and when he feels more confident will add in golfing and home ExRx. Until then encouraged balance training in the [employer] book. Pt is also having issues with sleep, talked over resources to help with this. -- Reviewed MET's and goals. Pt tolerated exercise well with an average MET level of 2.7. Pt is progressing MET's and increasing strength and stamina. He says he is feeling more fit and active. He is not yet ready to retunr to golf, but is trying to build strength to feel confident when he is ready. Patient was absent for holiday, but now has COVID, will review education with patient when he is well and can return to cardiac rehab.    Row Name 10/07/23 1619 10/09/23 1625 10/23/23 1616 11/15/23 1630     Exercise Comments Patient returned for his first day of exercise since illness, DEC WL for today to reacclimate. Will review education soon. Patient tolerated exercise well and felt good. MET AVG today 2.6. Reviewed MET's and goals. Pt tolerated exercise well with an average MET level of 2.65. Pt has been out due to illness, but it doing very well since returning. He still feels increased fatigue and some mild brain fog. He states he is keeping up with his PCP about ongoing symptoms. But as far as exercise he is back at his previous WL and is doing nicely. Re-evaluated goals are to increase strength and stamina gradually. Reviewed MET's and home exercise. Pt tolerated exercise well with an average MET level of 2.9. Pt has been doing well. Talked about gradual increase today and moved up WL. He  is feeling good and is walking some on his own. He belongs to a community gym so encouraged 1-2 days for 30-45 mins of walking or going to the community gym. Pt graduated the [employer]. Pt tolerated exercise well with an average MET level of 3.25. He did very well in the program and increased his by  154 ft for a total of 1514ft. He is also gaining more enduance and balance. He plans to exercise on his own by walking, golf and going to his community fitness center for 30-60 mins 4-5 days. Also gave some info for [employer] in case he was interested             Exercise Goals and Review:   Exercise Goals     Row Name 07/23/23 1256 12/20/23 1316           Exercise Goals   Increase Physical Activity Yes Yes      Intervention Provide advice, education, support and counseling about physical activity/exercise needs.;Develop an individualized exercise prescription for aerobic and resistive training based on initial evaluation findings, risk stratification, comorbidities and participant's personal goals. Provide advice, education, support and counseling about physical activity/exercise needs.;Develop an individualized exercise prescription for aerobic and resistive training based on initial evaluation findings, risk stratification, comorbidities and participant's personal goals.      Expected Outcomes Short Term: Attend rehab on a regular basis to increase amount of physical activity.;Long Term: Exercising regularly at least 3-5 days a week.;Long Term: Add in home exercise to make exercise part of routine and to increase amount of physical activity. Short Term: Attend rehab on a regular basis to increase amount of physical activity.;Long Term: Exercising regularly at least 3-5 days a week.;Long Term: Add in home exercise to make exercise part of routine and to increase amount of physical activity.      Increase Strength and Stamina Yes Yes      Intervention Provide advice, education, support and counseling about physical activity/exercise needs.;Develop an individualized exercise prescription for aerobic and resistive training based on initial evaluation findings, risk stratification, comorbidities and participant's personal goals. Provide advice, education, support and counseling about physical  activity/exercise needs.;Develop an individualized exercise prescription for aerobic and resistive training based on initial evaluation findings, risk stratification, comorbidities and participant's personal goals.      Expected Outcomes Short Term: Increase workloads from initial exercise prescription for resistance, speed, and METs.;Short Term: Perform resistance training exercises routinely during rehab and add in resistance training at home;Long Term: Improve cardiorespiratory fitness, muscular endurance and strength as measured by increased METs and functional capacity ( ) Short Term: Increase workloads from initial exercise prescription for resistance, speed, and METs.;Short Term: Perform resistance training exercises routinely during rehab and add in resistance training at home;Long Term: Improve cardiorespiratory fitness, muscular endurance and strength as measured by increased METs and functional capacity ( )      Able to understand and use rate of perceived exertion (RPE) scale Yes Yes      Intervention Provide education and explanation on how to use RPE scale Provide education and explanation on how to use RPE scale      Expected Outcomes Short Term: Able to use RPE daily in rehab to express subjective intensity level;Long Term:  Able to use RPE to guide intensity level when exercising independently Short Term: Able to use RPE daily in rehab to express subjective intensity level;Long Term:  Able to use RPE to guide intensity level when exercising independently  Able to understand and use Dyspnea scale -- Yes      Intervention -- Provide education and explanation on how to use Dyspnea scale      Expected Outcomes -- Short Term: Able to use Dyspnea scale daily in rehab to express subjective sense of shortness of breath during exertion;Long Term: Able to use Dyspnea scale to guide intensity level when exercising independently      Knowledge and understanding of Target Heart Rate Range  (THRR) Yes Yes      Intervention Provide education and explanation of THRR including how the numbers were predicted and where they are located for reference Provide education and explanation of THRR including how the numbers were predicted and where they are located for reference      Expected Outcomes Short Term: Able to state/look up THRR;Long Term: Able to use THRR to govern intensity when exercising independently;Short Term: Able to use daily as guideline for intensity in rehab Short Term: Able to state/look up THRR;Long Term: Able to use THRR to govern intensity when exercising independently;Short Term: Able to use daily as guideline for intensity in rehab      Understanding of Exercise Prescription Yes Yes      Intervention Provide education, explanation, and written materials on patient's individual exercise prescription Provide education, explanation, and written materials on patient's individual exercise prescription      Expected Outcomes Short Term: Able to explain program exercise prescription;Long Term: Able to explain home exercise prescription to exercise independently Short Term: Able to explain program exercise prescription;Long Term: Able to explain home exercise prescription to exercise independently               Exercise Goals Re-Evaluation :  Exercise Goals Re-Evaluation     Row Name 07/29/23 1654 08/14/23 1623 09/04/23 1630 09/06/23 0834 10/09/23 1622     Exercise Goal Re-Evaluation   Exercise Goals Review Increase Physical Activity;Understanding of Exercise Prescription;Increase Strength and Stamina;Knowledge and understanding of Target Heart Rate Range (THRR);Able to understand and use rate of perceived exertion (RPE) scale Increase Physical Activity;Understanding of Exercise Prescription;Increase Strength and Stamina;Knowledge and understanding of Target Heart Rate Range (THRR);Able to understand and use rate of perceived exertion (RPE) scale Increase Physical  Activity;Understanding of Exercise Prescription;Increase Strength and Stamina;Knowledge and understanding of Target Heart Rate Range (THRR);Able to understand and use rate of perceived exertion (RPE) scale -- Increase Physical Activity;Understanding of Exercise Prescription;Increase Strength and Stamina;Knowledge and understanding of Target Heart Rate Range (THRR);Able to understand and use rate of perceived exertion (RPE) scale   Comments Pt first day in the [facility] ICR program. Pt tolerated exercise well with an average MET level of 2.05. Pt is learning his THRR, RPE and ExRx Reviewed MET's and goals. Pt tolerated exercise well with an average MET level of 2.1. Pt is feeling gopod about his goals and is already increasing stamina. He is continuing to work on his balance and when he feels more confident will add in golfing and home ExRx. Until then encouraged balance training in the [employer] book. Pt is also having issues with sleep, talked over resources to help with this. Reviewed MET's and goals. Pt tolerated exercise well with an average MET level of 2.7. Pt is progressing MET's and increasing strength and stamina. He says he is feeling more fit and active. He is not yet ready to retunr to golf, but is trying to build strength to feel confident when he is ready. -- Reviewed MET's and goals. Pt tolerated exercise well  with an average MET level of 2.65. Pt has been out due to illness, but it doing very well since returning. He still feels increased fatigue and some mild brain fog. He states he is keeping up with his PCP about ongoing symptoms. But as far as exercise he is back at his previous [facility] and is doing nicely. Re-evaluated goals are to increase strength and stamina gradually.   Expected Outcomes Will continue to monitor pt and progress workloads as tolerated without sign or symptom Will continue to monitor pt and progress workloads as tolerated without sign or symptom Will continue to monitor pt and  progress workloads as tolerated without sign or symptom -- Will continue to monitor pt and progress workloads as tolerated without sign or symptom    Row Name 11/15/23 1626             Exercise Goal Re-Evaluation   Exercise Goals Review Increase Physical Activity;Understanding of Exercise Prescription;Increase Strength and Stamina;Knowledge and understanding of Target Heart Rate Range (THRR);Able to understand and use rate of perceived exertion (RPE) scale       Comments Pt graduated the [employer] program. Pt tolerated exercise well with an average MET level of 3.25. He did very well in the program and increased his by 154 ft for a total of 1531ft. He is also gaining more enduance and balance. He plans to exercise on his own by walking, golf and going to his community fitness center for 30-60 mins 4-5 days. Also gave some info for [employer] in case he was interested       Expected Outcomes Will continue to monitor pt and progress workloads as tolerated without sign or symptom                Discharge Exercise Prescription (Final Exercise Prescription Changes):  Exercise Prescription Changes - 11/15/23 1622       Response to Exercise   Blood Pressure (Admit) 118/70    Blood Pressure (Exit) 136/80    Heart Rate (Admit) 63 bpm    Heart Rate (Exercise) 106 bpm    Heart Rate (Exit) 83 bpm    Rating of Perceived Exertion (Exercise) 12    Perceived Dyspnea (Exercise) 0    Symptoms none    Comments Pt graduated the pritikin ICR    Duration Progress to 30 minutes of  aerobic without signs/symptoms of physical distress    Intensity THRR unchanged      Progression   Progression Continue to progress workloads to maintain intensity without signs/symptoms of physical distress.    Average METs 3.25      Resistance Training   Training Prescription Yes    Weight 3 lbs    Reps 10-15    Time 10 Minutes      Recumbant Bike   Level 3    RPM 74    Watts 43    Minutes 15    METs  3.7      NuStep   Level 2    SPM 130    Minutes 15    METs 3      Home Exercise Plan   Plans to continue exercise at [employer] (comment)    Frequency Add 2 additional days to program exercise sessions.    Initial Home Exercises Provided 10/23/23             Nutrition:  Target Goals: Understanding of nutrition guidelines, daily intake of sodium 1500mg , cholesterol 200mg , calories 30% from fat and  7% or less from saturated fats, daily to have 5 or more servings of fruits and vegetables.  Biometrics:  Pre Biometrics - 12/20/23 1304       Pre Biometrics   Grip Strength 30 kg             Post Biometrics - 11/08/23 1652        Post  Biometrics   Height 5\' 10"  (1.778 m)    Weight 79.4 kg    Waist Circumference 43 inches    Hip Circumference 37 inches    Waist to Hip Ratio 1.16 %    BMI (Calculated) 25.12    Triceps Skinfold 8 mm    % Body Fat 25.9 %    Grip Strength 25 kg    Single Leg Stand 10 seconds             Nutrition Therapy Plan and Nutrition Goals:  Nutrition Therapy & Goals - 07/29/23 1603       Nutrition Therapy   Diet Heart Healthy Diet    Drug/Food Interactions Statins/Certain Fruits      Personal Nutrition Goals   Nutrition Goal Patient to identify strategies for reducing cardiovascular risk by attending the [facility] education and nutrition series weekly.    Personal Goal #2 Patient to improve diet quality by using the plate method as a guide for meal planning to include lean protein/plant protein, fruits, vegetables, whole grains, nonfat dairy as part of a well-balanced diet.    Personal Goal #3 Patient to limit sodium intake to 2300mg  per day    Comments Patient with medical history of s/p TAVR, hyperlipidemia, CAD, aortic stenosis, s/p coronary artery stent placement. Danny [name]'s LDL remains well controlled. Patient will benefit from participation in intensive cardiac rehab for nutrition, exercise, and lifestyle modification.       Intervention Plan   Intervention Prescribe, educate and counsel regarding individualized specific dietary modifications aiming towards targeted core components such as weight, hypertension, lipid management, diabetes, heart failure and other comorbidities.;Nutrition handout(s) given to patient.    Expected Outcomes Short Term Goal: Understand basic principles of dietary content, such as calories, fat, sodium, cholesterol and nutrients.;Long Term Goal: Adherence to prescribed nutrition plan.             Nutrition Assessments:  Nutrition Assessments - 11/13/23 1423       Rate Your Plate Scores   Post Score 79            MEDIFICTS Score Key: >=70 Need to make dietary changes  40-70 Heart Healthy Diet <= 40 Therapeutic Level Cholesterol Diet  Flowsheet Row INTENSIVE CARDIAC REHAB from 11/13/2023 in [facility]  Picture Your Plate Total Score on Discharge 79      Picture Your Plate Scores: <98 Unhealthy dietary pattern with much room for improvement. 41-50 Dietary pattern unlikely to meet recommendations for good health and room for improvement. 51-60 More healthful dietary pattern, with some room for improvement.  >60 Healthy dietary pattern, although there may be some specific behaviors that could be improved.    Nutrition Goals Re-Evaluation:  Nutrition Goals Re-Evaluation     Row Name 07/29/23 1603             Goals   Current Weight 170 lb 6.7 oz (77.3 kg)       Comment lipoproteinA WNL, lipids WNL, lDL 56       Expected Outcome Patient with medical history of s/p TAVR,  hyperlipidemia, CAD, aortic stenosis, s/p coronary artery stent placement. Danny [name]'s LDL remains well controlled. Patient will benefit from participation in intensive cardiac rehab for nutrition, exercise, and lifestyle modification.                Nutrition Goals Discharge (Final Nutrition Goals Re-Evaluation):  Nutrition Goals  Re-Evaluation - 07/29/23 1603       Goals   Current Weight 170 lb 6.7 oz (77.3 kg)    Comment lipoproteinA WNL, lipids WNL, lDL 56    Expected Outcome Patient with medical history of s/p TAVR, hyperlipidemia, CAD, aortic stenosis, s/p coronary artery stent placement. Danny [name]'s LDL remains well controlled. Patient will benefit from participation in intensive cardiac rehab for nutrition, exercise, and lifestyle modification.             Psychosocial: Target Goals: Acknowledge presence or absence of significant depression and/or stress, maximize coping skills, provide positive support system. Participant is able to verbalize types and ability to use techniques and skills needed for reducing stress and depression.  Initial Review & Psychosocial Screening:  Initial Psych Review & Screening - 12/20/23 1312       Initial Review   Current issues with None Identified      Family Dynamics   Good Support System? Yes    Comments Spouse      Barriers   Psychosocial barriers to participate in program There are no identifiable barriers or psychosocial needs.      Screening Interventions   Interventions Encouraged to exercise             Quality of Life Scores:  Quality of Life - 11/13/23 1425       Quality of Life Scores   Health/Function Post 27.2 %    Socioeconomic Post 28.21 %    Psych/Spiritual Post 30 %    Family Post 28.5 %    GLOBAL Post 28.17 %            Scores of 19 and below usually indicate a poorer quality of life in these areas.  A difference of  2-3 points is a clinically meaningful difference.  A difference of 2-3 points in the total score of the Quality of Life Index has been associated with significant improvement in overall quality of life, self-image, physical symptoms, and general health in studies assessing change in quality of life.  PHQ-9: Review Flowsheet       12/20/2023 11/15/2023 07/23/2023 02/04/2020  Depression screen PHQ 2/9  Decreased Interest  0 0 0 0  Down, Depressed, Hopeless 0 0 0 0  PHQ - 2 Score 0 0 0 0  Altered sleeping 0 0 1 -  Tired, decreased energy 0 0 1 -  Change in appetite 0 1 0 -  Feeling bad or failure about yourself  0 0 0 -  Trouble concentrating 0 1 0 -  Moving slowly or fidgety/restless 0 0 0 -  Suicidal thoughts 0 0 0 -  PHQ-9 Score 0 2 2 -  Difficult doing work/chores Not difficult at all Not difficult at all Not difficult at all -   Interpretation of Total Score  Total Score Depression Severity:  1-4 = Minimal depression, 5-9 = Mild depression, 10-14 = Moderate depression, 15-19 = Moderately severe depression, 20-27 = Severe depression   Psychosocial Evaluation and Intervention:  Psychosocial Evaluation - 12/20/23 1313       Psychosocial Evaluation & Interventions   Interventions Encouraged to exercise with the program and follow exercise prescription  Comments Danny [name] denies any psychosocial barriers at this time.    Expected Outcomes For Danny [name] to participate in PR free of psychosocial concerns.    Continue Psychosocial Services  No Follow up required             Psychosocial Re-Evaluation:  Psychosocial Re-Evaluation     Row Name 07/30/23 0818 08/06/23 1637 08/23/23 0928 09/24/23 1204 10/18/23 1553     Psychosocial Re-Evaluation   Current issues with Current Sleep Concerns None Identified None Identified None Identified None Identified   Comments Danny [name] did not voice any concerns or stressors on his first day of exercise Danny [name] did not voice any increased concerns or stressors during exercise at cardiac rehab Danny [name] continues not voice any increased concerns or stressors during exercise at cardiac rehab Danny [name] is currently absent as he has COVID 19. Danny [name] has returned to cardiac rehab and will complete the program on 11/15/23.   Interventions Encouraged to attend Cardiac Rehabilitation for the exercise;Relaxation education Stress management education;Encouraged to attend Cardiac Rehabilitation  for the exercise;Relaxation education Stress management education;Encouraged to attend Cardiac Rehabilitation for the exercise;Relaxation education Stress management education;Encouraged to attend Cardiac Rehabilitation for the exercise;Relaxation education Stress management education;Encouraged to attend Cardiac Rehabilitation for the exercise;Relaxation education   Continue Psychosocial Services  Follow up required by staff No Follow up required No Follow up required No Follow up required No Follow up required            Psychosocial Discharge (Final Psychosocial Re-Evaluation):  Psychosocial Re-Evaluation - 10/18/23 1553       Psychosocial Re-Evaluation   Current issues with None Identified    Comments Danny [name] has returned to cardiac rehab and will complete the program on 11/15/23.    Interventions Stress management education;Encouraged to attend Cardiac Rehabilitation for the exercise;Relaxation education    Continue Psychosocial Services  No Follow up required             Education: Education Goals: Education classes will be provided on a weekly basis, covering required topics. Participant will state understanding/return demonstration of topics presented.  Learning Barriers/Preferences:  Learning Barriers/Preferences - 07/23/23 1250       Learning Barriers/Preferences   Learning Barriers Sight;Hearing   pt wears glasses and bilateral hearing aids   Learning Preferences Audio;Computer/Internet;Group Instruction;Individual Instruction;Pictoral;Skilled Demonstration;Verbal Instruction;Video;Written Material             Education Topics: Know Your Numbers Group instruction that is supported by a PowerPoint presentation. Instructor discusses importance of knowing and understanding resting, exercise, and post-exercise oxygen saturation, heart rate, and blood pressure. Oxygen saturation, heart rate, blood pressure, rating of perceived exertion, and dyspnea are reviewed along  with a normal range for these values.    Exercise for the Pulmonary Patient Group instruction that is supported by a PowerPoint presentation. Instructor discusses benefits of exercise, core components of exercise, frequency, duration, and intensity of an exercise routine, importance of utilizing pulse oximetry during exercise, safety while exercising, and options of places to exercise outside of rehab.    MET Level  Group instruction provided by PowerPoint, verbal discussion, and written material to support subject matter. Instructor reviews what METs are and how to increase METs.    Pulmonary Medications Verbally interactive group education provided by instructor with focus on inhaled medications and proper administration.   Anatomy and Physiology of the Respiratory System Group instruction provided by PowerPoint, verbal discussion, and written material to support subject matter. Instructor reviews respiratory cycle and anatomical components of the respiratory  system and their functions. Instructor also reviews differences in obstructive and restrictive respiratory diseases with examples of each.    Oxygen Safety Group instruction provided by PowerPoint, verbal discussion, and written material to support subject matter. There is an overview of "What is Oxygen" and "Why do we need it".  Instructor also reviews how to create a safe environment for oxygen use, the importance of using oxygen as prescribed, and the risks of noncompliance. There is a brief discussion on traveling with oxygen and resources the patient may utilize.   Oxygen Use Group instruction provided by PowerPoint, verbal discussion, and written material to discuss how supplemental oxygen is prescribed and different types of oxygen supply systems. Resources for more information are provided.    Breathing Techniques Group instruction that is supported by demonstration and informational handouts. Instructor discusses the  benefits of pursed lip and diaphragmatic breathing and detailed demonstration on how to perform both.     Risk Factor Reduction Group instruction that is supported by a PowerPoint presentation. Instructor discusses the definition of a risk factor, different risk factors for pulmonary disease, and how the heart and lungs work together.   Pulmonary Diseases Group instruction provided by PowerPoint, verbal discussion, and written material to support subject matter. Instructor gives an overview of the different type of pulmonary diseases. There is also a discussion on risk factors and symptoms as well as ways to manage the diseases.   Stress and Energy Conservation Group instruction provided by PowerPoint, verbal discussion, and written material to support subject matter. Instructor gives an overview of stress and the impact it can have on the body. Instructor also reviews ways to reduce stress. There is also a discussion on energy conservation and ways to conserve energy throughout the day.   Warning Signs and Symptoms Group instruction provided by PowerPoint, verbal discussion, and written material to support subject matter. Instructor reviews warning signs and symptoms of stroke, heart attack, cold and flu. Instructor also reviews ways to prevent the spread of infection.   Other Education Group or individual verbal, written, or video instructions that support the educational goals of the pulmonary rehab program.    Knowledge Questionnaire Score:  Knowledge Questionnaire Score - 12/20/23 1317       Knowledge Questionnaire Score   Pre Score 15/18             Core Components/Risk Factors/Patient Goals at Admission:  Personal Goals and Risk Factors at Admission - 12/20/23 1314       Core Components/Risk Factors/Patient Goals on Admission   Improve shortness of breath with ADL's Yes    Intervention Provide education, individualized exercise plan and daily activity instruction to  help decrease symptoms of SOB with activities of daily living.    Expected Outcomes Short Term: Improve cardiorespiratory fitness to achieve a reduction of symptoms when performing ADLs;Long Term: Be able to perform more ADLs without symptoms or delay the onset of symptoms             Core Components/Risk Factors/Patient Goals Review:   Goals and Risk Factor Review     Row Name 07/30/23 0819 08/06/23 1638 08/23/23 0930 09/24/23 1206 10/18/23 1555     Core Components/Risk Factors/Patient Goals Review   Personal Goals Review Weight Management/Obesity;Hypertension;Lipids Weight Management/Obesity;Hypertension;Lipids Weight Management/Obesity;Hypertension;Lipids Weight Management/Obesity;Hypertension;Lipids Weight Management/Obesity;Hypertension;Lipids   Review Danny [name] started cardiac rehab on 07/29/23. Danny [name] did well with exercise. Vital signs were stable. Danny [name] started cardiac rehab on 07/29/23. Danny [name] is off to a good start to exercise.  Vital signs have been  stable. Danny [name] is doing well with exercise at cardiac rehab. . Vital signs have been  stable. Danny [name] has lost 3.4 kg since starting cardiac rehab. Danny [name] is doing well with exercise at cardiac rehab. . Vital signs have been  stable. Danny [name] is currently absent as he has COVID 19. Danny [name] plans to return to exercise on 10/02/23. Danny [name] is doing well with exercise at cardiac rehab. . Vital signs have been  stable. Danny [name] will complete cardiac rehab on 11/15/23.   Expected Outcomes Danny [name] will continue to participate in cardiac rehab for exercise nutrition and lifestyle modifications. Danny [name] will continue to participate in cardiac rehab for exercise nutrition and lifestyle modifications. Danny [name] will continue to participate in cardiac rehab for exercise nutrition and lifestyle modifications. Danny [name] will continue to participate in cardiac rehab for exercise nutrition and lifestyle modifications. Danny [name] will continue to participate in cardiac rehab for exercise  nutrition and lifestyle modifications.            Core Components/Risk Factors/Patient Goals at Discharge (Final Review):   Goals and Risk Factor Review - 10/18/23 1555       Core Components/Risk Factors/Patient Goals Review   Personal Goals Review Weight Management/Obesity;Hypertension;Lipids    Review Danny [name] is doing well with exercise at cardiac rehab. . Vital signs have been  stable. Danny [name] will complete cardiac rehab on 11/15/23.    Expected Outcomes Danny [name] will continue to participate in cardiac rehab for exercise nutrition and lifestyle modifications.             ITP Comments:  ITP Comments     Row Name 07/30/23 0816 08/06/23 1636 08/23/23 0926 09/24/23 1201 10/18/23 1553   ITP Comments 30 Day ITP Review. Danny [name] started cardiac rehab on 07/29/23. Danny [name] did well with exercise. 30 Day ITP Review. Danny [name] started cardiac rehab on 07/29/23. Danny [name] is off to a good start to exercise. 30 Day ITP Review. Danny [name] has good attendance and participation with  exercise at cardiac rehab. 30 Day ITP Review. Danny [name] has good attendance and participation with  exercise at cardiac rehab. Danny [name] is currently absent as he has COVID 19 30 Day ITP Review. Danny [name] has good attendance and participation with exercise at cardiac rehab  since his return post COVID 19 infection.           Dr. Mechele Collin is Medical Director for Pulmonary Rehab at [facility].

## 2023-12-20 NOTE — Progress Notes (Signed)
 Danny [name] 87 y.o. male Pulmonary Rehab Orientation Note This patient who was referred to Pulmonary Rehab by Dr. Shirlee Latch with the diagnosis of Diastolic heart failure arrived today in Cardiac and Pulmonary Rehab. He arrived ambulatory with normal gait. He does not carry portable oxygen. Per patient, Danny [name] uses oxygen never. Color good, skin warm and dry. Patient is oriented to time and place. Patient's medical history, psychosocial health, and medications reviewed. Psychosocial assessment reveals patient lives with spouse. Danny [name] is currently retired. Patient hobbies include reading and golfing. Patient reports his stress level is moderate. Areas of stress/anxiety include health. Patient does not exhibit signs of depression. PHQ2/9 score 0/0. Danny [name] shows good  coping skills with positive outlook on life. Offered emotional support and reassurance. Will continue to monitor. Physical assessment performed by Danny Hart RN. Please see their orientation physical assessment note. Danny [name] reports he  does take medications as prescribed. Patient states he  follows a regular  diet. The patient reports no specific efforts to gain or lose weight.. Patient's weight will be monitored closely. Demonstration and practice of PLB using pulse oximeter. Caster able to return demonstration satisfactorily. Safety and hand hygiene in the exercise area reviewed with patient. Danny [name] voices understanding of the information reviewed. Department expectations discussed with patient and achievable goals were set. The patient shows enthusiasm about attending the program and we look forward to working with Danny [name]. Danny [name] completed a 6 min walk test today and is scheduled to begin exercise on 12/31/23 at 1:15 pm.  [id number] Danny San, MS, ACSM-CEP

## 2023-12-24 ENCOUNTER — Other Ambulatory Visit (HOSPITAL_COMMUNITY): Payer: Self-pay | Admitting: Cardiology

## 2023-12-26 ENCOUNTER — Encounter (HOSPITAL_COMMUNITY)

## 2023-12-26 ENCOUNTER — Ambulatory Visit (HOSPITAL_COMMUNITY)
Admission: RE | Admit: 2023-12-26 | Discharge: 2023-12-26 | Disposition: A | Discharge status: Home / Self Care | Source: Ambulatory Visit | Attending: Cardiovascular Disease | Admitting: Cardiovascular Disease

## 2023-12-26 DIAGNOSIS — M79661 Pain in right lower leg: Secondary | ICD-10-CM | POA: Diagnosis not present

## 2023-12-26 DIAGNOSIS — M79662 Pain in left lower leg: Secondary | ICD-10-CM

## 2023-12-27 ENCOUNTER — Telehealth (HOSPITAL_COMMUNITY): Payer: Self-pay

## 2023-12-27 NOTE — Telephone Encounter (Signed)
 Called and spoke with patients wife [name] (okay per [facility]) advised her of the results. She verbalized understanding. Advised her to call back with concerns.

## 2023-12-27 NOTE — Telephone Encounter (Signed)
-----   Message from Marca Ancona sent at 12/26/2023  9:23 PM EDT ----- No DVT.

## 2023-12-31 ENCOUNTER — Encounter (HOSPITAL_COMMUNITY)
Admission: RE | Admit: 2023-12-31 | Discharge: 2023-12-31 | Disposition: A | Discharge status: Home / Self Care | Source: Ambulatory Visit | Attending: Cardiology | Admitting: Cardiology

## 2023-12-31 VITALS — Wt 174.8 lb

## 2023-12-31 DIAGNOSIS — I5032 Chronic diastolic (congestive) heart failure: Secondary | ICD-10-CM | POA: Diagnosis not present

## 2023-12-31 DIAGNOSIS — Z952 Presence of prosthetic heart valve: Secondary | ICD-10-CM | POA: Diagnosis not present

## 2023-12-31 NOTE — Progress Notes (Signed)
 Daily Session Note  Patient Details  Name: Danny [name] MRN: [id number] Date of Birth: [birth year] Referring Provider:   [name] Pulmonary Rehab Walk Test from 12/20/2023 in [facility]  Referring Provider [name]       Encounter Date: 12/31/2023  Check In:  Session Check In - 12/31/23 1323       Check-In   Physician(s) [name], NP    Location [facility]    Staff Present Willard Harman, RN, Shasta Deist, MS, ACSM-CEP, Exercise Physiologist;Randi Rochelle Chu, ACSM-CEP, Exercise Physiologist;Casey Felipe Horton, RT    Virtual Visit No    Medication changes reported     No    Fall or balance concerns reported    No    Tobacco Cessation No Change    Warm-up and Cool-down Performed as group-led instruction    Resistance Training Performed Yes    VAD Patient? No    PAD/SET Patient? No      Pain Assessment   Currently in Pain? No/denies    Multiple Pain Sites No             Capillary Blood Glucose: No results found for this or any previous visit (from the past 24 hours).   Exercise Prescription Changes - 12/31/23 1500       Response to Exercise   Blood Pressure (Admit) 142/84    Blood Pressure (Exercise) 130/70    Blood Pressure (Exit) 118/68    Heart Rate (Admit) 61 bpm    Heart Rate (Exercise) 84 bpm    Heart Rate (Exit) 64 bpm    Oxygen Saturation (Admit) 97 %    Oxygen Saturation (Exercise) 98 %    Oxygen Saturation (Exit) 97 %    Rating of Perceived Exertion (Exercise) 12    Perceived Dyspnea (Exercise) 1    Duration Progress to 30 minutes of  aerobic without signs/symptoms of physical distress    Intensity THRR unchanged      Progression   Progression Continue to progress workloads to maintain intensity without signs/symptoms of physical distress.      Resistance Training   Training Prescription Yes    Weight blue bands    Reps 10-15    Time 10 Minutes      Recumbant  Bike   Level 2    RPM 85    Watts 28    Minutes 15    METs 2.6      NuStep   Level 2    SPM 116    Minutes 15    METs 2.5             Social History   Tobacco Use  Smoking Status Never  Smokeless Tobacco Never    Goals Met:  Independence with exercise equipment Exercise tolerated well No report of concerns or symptoms today Strength training completed today  Goals Unmet:  Not Applicable  Comments: Service time is from 1306 to 1441    Dr. [name] is Medical Director for Pulmonary Rehab at [facility].

## 2024-01-01 NOTE — Progress Notes (Signed)
 Pulmonary Individual Treatment Plan  Patient Details  Name: Danny [name] MRN: [id number] Date of Birth: 13-Jul-1936 Referring Provider:   [name] Pulmonary Rehab Walk Test from 12/20/2023 in [facility]  Referring Provider [facility]       Initial Encounter Date:  Flowsheet Row Pulmonary Rehab Walk Test from 12/20/2023 in [facility], [facility], & [facility]  Date 12/20/23       Visit Diagnosis: Heart failure, diastolic, chronic (HCC)  Patient's Home Medications on Admission:   Current Outpatient Medications:    amoxicillin (AMOXIL) 500 MG capsule, Take 4 capsules (2,000 mg total) by mouth once as needed for up to 1 dose. 1 HOUR PRIOR TO DENTAL CLEANINGS, Disp: 4 capsule, Rfl: 4   aspirin EC 81 MG tablet, Take 81 mg by mouth in the morning., Disp: , Rfl:    betamethasone dipropionate 0.05 % cream, Apply 0.5 application  topically 2 (two) times daily as needed (dry/irritated skin)., Disp: , Rfl:    Calcium-Magnesium (CAL-MAG PO), Take 1 tablet by mouth in the morning. With Potassium 500 mg /500 mg /99 mg, Disp: , Rfl:    Cholecalciferol (VITAMIN D3) 250 MCG (10000 UT) capsule, Take 10,000 Units by mouth in the morning, at noon, and at bedtime., Disp: , Rfl:    clopidogrel (PLAVIX) 75 MG tablet, Take 1 tablet (75 mg total) by mouth daily with breakfast., Disp: 90 tablet, Rfl: 3   Coenzyme Q10 (CO Q 10 PO), Take 400 mg by mouth in the morning., Disp: , Rfl:    finasteride (PROSCAR) 5 MG tablet, Take 5 mg by mouth in the morning., Disp: , Rfl:    furosemide (LASIX) 20 MG tablet, Take 1 tablet (20 mg total) by mouth as needed (For weight gain of 3 lbs in 24 hours or 5 lbs in a week)., Disp: 30 tablet, Rfl: 3   gabapentin (NEURONTIN) 100 MG capsule, Take 1 capsule (100 mg total) by mouth daily as needed., Disp: 30 capsule, Rfl: 5   gabapentin (NEURONTIN) 600 MG tablet, TAKE 1 TABLET AT 4PM AND  TAKE 1 TABLET AT BEDTIME., Disp: 180 tablet, Rfl: 3   Magnesium Oxide 400 MG CAPS, Take 1 capsule (400 mg total) by mouth daily., Disp: 30 capsule, Rfl: 11   metoprolol succinate (TOPROL-XL) 25 MG 24 hr tablet, TAKE ONE TABLET BY MOUTH DAILY, Disp: 90 tablet, Rfl: 3   Multiple Vitamin (MULTIVITAMIN WITH MINERALS) TABS tablet, Take 1 tablet by mouth daily. Centrum Silver, Disp: , Rfl:    Multiple Vitamins-Minerals (PRESERVISION AREDS 2) CAPS, Take 1 capsule by mouth in the morning and at bedtime., Disp: , Rfl:    nitroGLYCERIN (NITROSTAT) 0.4 MG SL tablet, ONE TABLET UNDER TONGUE WHEN NEEDED FOR CHEST PAIN. MAY REPEAT IN 5 MINUTES., Disp: 25 tablet, Rfl: 6   pantoprazole (PROTONIX) 40 MG tablet, Take 1 tablet (40 mg total) by mouth daily., Disp: 90 tablet, Rfl: 3   Probiotic Product (PROBIOTIC-10 PO), Take 1 capsule by mouth in the morning., Disp: , Rfl:    Propylene Glycol (SYSTANE BALANCE OP), Apply 1 drop to eye as needed., Disp: , Rfl:    rosuvastatin (CRESTOR) 20 MG tablet, Take 1 tablet (20 mg total) by mouth daily., Disp: 90 tablet, Rfl: 3   Saw Palmetto 160 MG CAPS, Take 160 mg by mouth 2 (two) times daily., Disp: , Rfl:    traMADol (ULTRAM) 50 MG tablet, Take 50 mg  by mouth 3 (three) times daily as needed (back pain.)., Disp: , Rfl:    traZODone (DESYREL) 50 MG tablet, Take 25 mg by mouth at bedtime as needed for sleep., Disp: , Rfl:    Turmeric (QC TUMERIC COMPLEX) 500 MG CAPS, Take 500 mg by mouth in the morning., Disp: , Rfl:    zolpidem (AMBIEN) 10 MG tablet, Take 10 mg by mouth at bedtime., Disp: , Rfl:   Past Medical History: Past Medical History:  Diagnosis Date   Allergic rhinitis    Arthritis    BPH (benign prostatic hypertrophy)    Dr. Laverle Patter   Cervical disc disease    Coronary artery disease    Patient developed exertional dyspnea. ETT-myoview (8/12) with 11 METS, normal LV EF, normal perfusion at rest and stress but elevated TID ratio. Coronary CT angiogram in 9/12 showed  equivocal significant disease in the CFX. Cath in 10/12 showed EF 55-60%, 30% mid RCA, 40% distal RCA, 40% proximal LAD. ETT-Myoview (10/14): Diaphragmatic attenuation, no ischemia, EF 62%, normal study   Detached vitreous humor    Diverticulosis    GERD (gastroesophageal reflux disease)    Hemorrhoids    Impaired fasting glucose    Ingrown toenail    Lumbar radiculopathy    Numbness and tingling in left arm    Onychomycosis    Rotator cuff disorder    S/P TAVR (transcatheter aortic valve replacement) 06/11/2023   s/p TAVR with a 26 mm Edwards S3UR via the TF approach by Dr. 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    Tobacco Use: Social History   Tobacco Use  Smoking Status Never  Smokeless Tobacco Never    Labs: Review Flowsheet  More data exists      Latest Ref Rng & Units 08/09/2020 08/15/2021 08/23/2022 05/02/2023 09/13/2023  Labs for ITP Cardiac and Pulmonary Rehab  Cholestrol 0 - 200 mg/dL 528  413  244  - 010   LDL (calc) 0 - 99 mg/dL 68  45  56  - 62   HDL-C >40 mg/dL 68  60  59  - 53   Trlycerides <150 mg/dL 55  43  73  - 272   Bicarbonate 20.0 - 28.0 mmol/L - - - 24.9  24.3  -  TCO2 22 - 32 mmol/L - - - 26  26  -  Acid-base deficit 0.0 - 2.0 mmol/L - - - 1.0  1.0  -  O2 Saturation % - - - 72  69  -    Details       Multiple values from one day are sorted in reverse-chronological order         Capillary Blood Glucose: No results found for: "GLUCAP"   Pulmonary Assessment Scores:  Pulmonary Assessment Scores     Row Name 12/20/23 1424         ADL UCSD   ADL Phase Entry     SOB Score total 32       CAT Score   CAT Score 12       mMRC Score   mMRC Score 2             UCSD: Self-administered rating of dyspnea associated with activities of daily living (ADLs) 6-point scale (0 = "not at all" to 5 = "maximal or unable to do because of breathlessness")  Scoring Scores range from 0 to 120.  Minimally  important difference is 5 units  CAT: CAT  can identify the health impairment of COPD patients and is better correlated with disease progression.  CAT has a scoring range of zero to 40. The CAT score is classified into four groups of low (less than 10), medium (10 - 20), high (21-30) and very high (31-40) based on the impact level of disease on health status. A CAT score over 10 suggests significant symptoms.  A worsening CAT score could be explained by an exacerbation, poor medication adherence, poor inhaler technique, or progression of COPD or comorbid conditions.  CAT MCID is 2 points  mMRC: mMRC (Modified Medical Research Council) Dyspnea Scale is used to assess the degree of baseline functional disability in patients of respiratory disease due to dyspnea. No minimal important difference is established. A decrease in score of 1 point or greater is considered a positive change.   Pulmonary Function Assessment:  Pulmonary Function Assessment - 12/20/23 1320       Breath   Shortness of Breath Yes;Limiting activity             Exercise Target Goals: Exercise Program Goal: Individual exercise prescription set using results from initial 6 min walk test and THRR while considering  patient's activity barriers and safety.   Exercise Prescription Goal: Initial exercise prescription builds to 30-45 minutes a day of aerobic activity, 2-3 days per week.  Home exercise guidelines will be given to patient during program as part of exercise prescription that the participant will acknowledge.  Activity Barriers & Risk Stratification:  Activity Barriers & Cardiac Risk Stratification - 12/20/23 1315       Activity Barriers & Cardiac Risk Stratification   Activity Barriers Balance Concerns;Arthritis;Joint Problems;Back Problems;Deconditioning;Neck/Spine Problems             6 Minute Walk:  6 Minute Walk     Row Name 07/23/23 1253 11/08/23 1651 12/20/23 1409     6 Minute Walk   Phase  Initial Discharge Initial   Distance 1386 feet 1540 feet 1502 feet   Distance % Change -- 11.11 % --   Distance Feet Change -- 154 ft --   Walk Time 6 minutes 6 minutes 6 minutes   # of Rest Breaks 0 0 0   MPH 2.63 2.92 2.84   METS 2.46 2.61 2.45   RPE 9 11 11    Perceived Dyspnea  0 0 1   VO2 Peak 8.32 9.12 8.56   Symptoms No No No   Resting HR 60 bpm 65 bpm 69 bpm   Resting BP 114/70 120/72 110/66   Resting Oxygen Saturation  96 % -- 98 %   Exercise Oxygen Saturation  during 6 min walk 98 % -- 90 %   Max Ex. HR 88 bpm 91 bpm 87 bpm   Max Ex. BP 134/72 130/80 128/66   2 Minute Post BP 120/70 120/70 122/70     Interval HR   1 Minute HR -- -- 69   2 Minute HR -- -- 79   3 Minute HR -- -- 75   4 Minute HR -- -- 80   5 Minute HR -- -- 77   6 Minute HR -- -- 87   2 Minute Post HR -- -- 71   Interval Heart Rate? -- -- Yes     Interval Oxygen   Interval Oxygen? -- -- Yes   Baseline Oxygen Saturation % -- -- 98 %   1 Minute Oxygen Saturation % -- -- 93 %   1 Minute Liters of  Oxygen -- -- 0 L   2 Minute Oxygen Saturation % -- -- 91 %   2 Minute Liters of Oxygen -- -- 0 L   3 Minute Oxygen Saturation % -- -- 95 %   3 Minute Liters of Oxygen -- -- 0 L   4 Minute Oxygen Saturation % -- -- 90 %   4 Minute Liters of Oxygen -- -- 0 L   5 Minute Oxygen Saturation % -- -- 97 %   5 Minute Liters of Oxygen -- -- 0 L   6 Minute Oxygen Saturation % -- -- 90 %   6 Minute Liters of Oxygen -- -- 0 L   2 Minute Post Oxygen Saturation % -- -- 93 %   2 Minute Post Liters of Oxygen -- -- 0 L            Oxygen Initial Assessment:  Oxygen Initial Assessment - 12/20/23 1316       Home Oxygen   Home Oxygen Device None    Sleep Oxygen Prescription None    Home Exercise Oxygen Prescription None    Home Resting Oxygen Prescription None      Initial 6 min Walk   Oxygen Used None      Program Oxygen Prescription   Program Oxygen Prescription None      Intervention   Short Term Goals  To learn and understand importance of maintaining oxygen saturations>88%;To learn and demonstrate proper use of respiratory medications;To learn and understand importance of monitoring SPO2 with pulse oximeter and demonstrate accurate use of the pulse oximeter.;To learn and demonstrate proper pursed lip breathing techniques or other breathing techniques.     Long  Term Goals Verbalizes importance of monitoring SPO2 with pulse oximeter and return demonstration;Maintenance of O2 saturations>88%;Exhibits proper breathing techniques, such as pursed lip breathing or other method taught during program session;Compliance with respiratory medication;Demonstrates proper use of MDI's             Oxygen Re-Evaluation:  Oxygen Re-Evaluation     Row Name 12/27/23 0732             Program Oxygen Prescription   Program Oxygen Prescription None         Home Oxygen   Home Oxygen Device None       Sleep Oxygen Prescription None       Home Exercise Oxygen Prescription None       Home Resting Oxygen Prescription None         Goals/Expected Outcomes   Short Term Goals To learn and understand importance of maintaining oxygen saturations>88%;To learn and demonstrate proper use of respiratory medications;To learn and understand importance of monitoring SPO2 with pulse oximeter and demonstrate accurate use of the pulse oximeter.;To learn and demonstrate proper pursed lip breathing techniques or other breathing techniques.        Long  Term Goals Verbalizes importance of monitoring SPO2 with pulse oximeter and return demonstration;Maintenance of O2 saturations>88%;Exhibits proper breathing techniques, such as pursed lip breathing or other method taught during program session;Compliance with respiratory medication;Demonstrates proper use of MDI's       Goals/Expected Outcomes Compliance and understanding of oxygen saturation monitoring and breath techniques to decrease shortness of breath.                 Oxygen Discharge (Final Oxygen Re-Evaluation):  Oxygen Re-Evaluation - 12/27/23 0732       Program Oxygen Prescription   Program Oxygen Prescription None  Home Oxygen   Home Oxygen Device None    Sleep Oxygen Prescription None    Home Exercise Oxygen Prescription None    Home Resting Oxygen Prescription None      Goals/Expected Outcomes   Short Term Goals To learn and understand importance of maintaining oxygen saturations>88%;To learn and demonstrate proper use of respiratory medications;To learn and understand importance of monitoring SPO2 with pulse oximeter and demonstrate accurate use of the pulse oximeter.;To learn and demonstrate proper pursed lip breathing techniques or other breathing techniques.     Long  Term Goals Verbalizes importance of monitoring SPO2 with pulse oximeter and return demonstration;Maintenance of O2 saturations>88%;Exhibits proper breathing techniques, such as pursed lip breathing or other method taught during program session;Compliance with respiratory medication;Demonstrates proper use of MDI's    Goals/Expected Outcomes Compliance and understanding of oxygen saturation monitoring and breath techniques to decrease shortness of breath.             Initial Exercise Prescription:  Initial Exercise Prescription - 12/20/23 1400       Date of Initial Exercise RX and Referring Provider   Date 12/20/23    Referring Provider [facility]    Expected Discharge Date 03/17/24      Recumbant Bike   Level 2    RPM 70    Watts 40    Minutes 15    METs 3.5      NuStep   Level 1    SPM 100    Minutes 15    METs 2.5      Intensity   THRR 40-80% of Max Heartrate 54-108    Ratings of Perceived Exertion 11-13    Perceived Dyspnea 0-4      Progression   Progression Continue progressive overload as per policy without signs/symptoms or physical distress.      Resistance Training   Training Prescription Yes    Weight blue bands    Reps 10-15              Perform Capillary Blood Glucose checks as needed.  Exercise Prescription Changes:   Exercise Prescription Changes     Row Name 07/29/23 1651 08/14/23 1620 09/06/23 0832 10/09/23 1620 10/23/23 1613     Response to Exercise   Blood Pressure (Admit) 138/74 120/82 134/80 120/64 120/70   Blood Pressure (Exercise) 138/78 128/72 -- 128/72 --   Blood Pressure (Exit) 128/76 128/78 124/80 104/72 108/72   Heart Rate (Admit) 64 bpm 64 bpm 69 bpm 54 bpm 55 bpm   Heart Rate (Exercise) 92 bpm 81 bpm 111 bpm 92 bpm 105 bpm   Heart Rate (Exit) 72 bpm 66 bpm 75 bpm 63 bpm 64 bpm   Rating of Perceived Exertion (Exercise) 10 11 12 12 12    Perceived Dyspnea (Exercise) 0 0 0 0 0   Symptoms none none none none none   Comments Pt frist day in the Pritikin ICR progam Reviewed MET's and goals Reviewed MET's and goals Reviewed MET's and goals Reviewed MET's   Duration Progress to 30 minutes of  aerobic without signs/symptoms of physical distress Progress to 30 minutes of  aerobic without signs/symptoms of physical distress Progress to 30 minutes of  aerobic without signs/symptoms of physical distress Progress to 30 minutes of  aerobic without signs/symptoms of physical distress Progress to 30 minutes of  aerobic without signs/symptoms of physical distress   Intensity THRR unchanged THRR unchanged THRR unchanged THRR unchanged THRR unchanged     Progression   Progression  Continue to progress workloads to maintain intensity without signs/symptoms of physical distress. Continue to progress workloads to maintain intensity without signs/symptoms of physical distress. Continue to progress workloads to maintain intensity without signs/symptoms of physical distress. Continue to progress workloads to maintain intensity without signs/symptoms of physical distress. Continue to progress workloads to maintain intensity without signs/symptoms of physical distress.   Average METs 2.05 2.1 2.7 2.65 2.9     Resistance  Training   Training Prescription Yes No No No No   Weight 3 lbs 3 lbs 3 lbs 3 lbs 3 lbs   Reps 10-15 10-15 10-15 10-15 10-15   Time 10 Minutes 10 Minutes 10 Minutes 10 Minutes 10 Minutes     Recumbant Bike   Level 2 2 2 2 3    RPM 65 72 82 81 90   Watts 20 20 32 28 31   Minutes 15 15 15 15 15    METs 2.3 2.3 2.8 3 2.8     NuStep   Level -- -- 1 1 2    SPM -- -- 120 134 145   Minutes -- -- 15 15 15    METs -- -- 2.6 2.3 3     Arm Ergometer   Level 1.5 1.5 -- -- --   Watts 12 -- -- -- --   RPM 49 -- -- -- --   Minutes 15 15 -- -- --   METs 1.8 1.9 -- -- --     Home Exercise Plan   Plans to continue exercise at -- -- -- -- [employer] (comment)   Frequency -- -- -- -- Add 2 additional days to program exercise sessions.   Initial Home Exercises Provided -- -- -- -- 10/23/23    Row Name 11/15/23 1622 12/31/23 1500           Response to Exercise   Blood Pressure (Admit) 118/70 142/84      Blood Pressure (Exercise) -- 130/70      Blood Pressure (Exit) 136/80 118/68      Heart Rate (Admit) 63 bpm 61 bpm      Heart Rate (Exercise) 106 bpm 84 bpm      Heart Rate (Exit) 83 bpm 64 bpm      Oxygen Saturation (Admit) -- 97 %      Oxygen Saturation (Exercise) -- 98 %      Oxygen Saturation (Exit) -- 97 %      Rating of Perceived Exertion (Exercise) 12 12      Perceived Dyspnea (Exercise) 0 1      Symptoms none --      Comments Pt graduated the pritikin ICR --      Duration Progress to 30 minutes of  aerobic without signs/symptoms of physical distress Progress to 30 minutes of  aerobic without signs/symptoms of physical distress      Intensity THRR unchanged THRR unchanged        Progression   Progression Continue to progress workloads to maintain intensity without signs/symptoms of physical distress. Continue to progress workloads to maintain intensity without signs/symptoms of physical distress.      Average METs 3.25 --        Resistance Training   Training  Prescription Yes Yes      Weight 3 lbs blue bands      Reps 10-15 10-15      Time 10 Minutes 10 Minutes        Recumbant Bike   Level 3 2  RPM 74 85      Watts 43 28      Minutes 15 15      METs 3.7 2.6        NuStep   Level 2 2      SPM 130 116      Minutes 15 15      METs 3 2.5        Home Exercise Plan   Plans to continue exercise at [facility] (comment) --      Frequency Add 2 additional days to program exercise sessions. --      Initial Home Exercises Provided 10/23/23 --               Exercise Comments:   Exercise Comments     Row Name 07/29/23 1655 08/14/23 1626 09/04/23 0836 09/04/23 1630 09/23/23 1048   Exercise Comments Pt first day in the Pritikin ICR program. Pt tolerated exercise well with an average MET level of 2.05. Pt is learning his THRR, RPE and ExRx Reviewed MET's and goals. Pt tolerated exercise well with an average MET level of 2.1. Pt is feeling gopod about his goals and is already increasing stamina. He is continuing to work on his balance and when he feels more confident will add in golfing and home ExRx. Until then encouraged balance training in the Pritikin book. Pt is also having issues with sleep, talked over resources to help with this. -- Reviewed MET's and goals. Pt tolerated exercise well with an average MET level of 2.7. Pt is progressing MET's and increasing strength and stamina. He says he is feeling more fit and active. He is not yet ready to retunr to golf, but is trying to build strength to feel confident when he is ready. Patient was absent for holiday, but now has COVID, will review education with patient when he is well and can return to cardiac rehab.    Row Name 10/07/23 1619 10/09/23 1625 10/23/23 1616 11/15/23 1630 12/31/23 1518   Exercise Comments Patient returned for his first day of exercise since illness, DEC WL for today to reacclimate. Will review education soon. Patient tolerated exercise well and felt good. MET AVG  today 2.6. Reviewed MET's and goals. Pt tolerated exercise well with an average MET level of 2.65. Pt has been out due to illness, but it doing very well since returning. He still feels increased fatigue and some mild brain fog. He states he is keeping up with his PCP about ongoing symptoms. But as far as exercise he is back at his previous WL and is doing nicely. Re-evaluated goals are to increase strength and stamina gradually. Reviewed MET's and home exercise. Pt tolerated exercise well with an average MET level of 2.9. Pt has been doing well. Talked about gradual increase today and moved up WL. He is feeling good and is walking some on his own. He belongs to a community gym so encouraged 1-2 days for 30-45 mins of walking or going to the community gym. Pt graduated the [employer]. Pt tolerated exercise well with an average MET level of 3.25. He did very well in the program and increased his by 154 ft for a total of 1585ft. He is also gaining more enduance and balance. He plans to exercise on his own by walking, golf and going to his community fitness center for 30-60 mins 4-5 days. Also gave some info for [employer] in case he was interested Pt completed his first day  of group exercise in Pulmonary Rehab. He exercised on the recumbent stepper for 15 min, level 2, METs 2.5. He then exercised on the recumbent bike for 15 min, level 2, METs 2.6. Tolerated well, performing warm up and cool down without significant limitations. Discussed METs, performing squats.            Exercise Goals and Review:   Exercise Goals     Row Name 07/23/23 1256 12/20/23 1316           Exercise Goals   Increase Physical Activity Yes Yes      Intervention Provide advice, education, support and counseling about physical activity/exercise needs.;Develop an individualized exercise prescription for aerobic and resistive training based on initial evaluation findings, risk stratification, comorbidities and  participant's personal goals. Provide advice, education, support and counseling about physical activity/exercise needs.;Develop an individualized exercise prescription for aerobic and resistive training based on initial evaluation findings, risk stratification, comorbidities and participant's personal goals.      Expected Outcomes Short Term: Attend rehab on a regular basis to increase amount of physical activity.;Long Term: Exercising regularly at least 3-5 days a week.;Long Term: Add in home exercise to make exercise part of routine and to increase amount of physical activity. Short Term: Attend rehab on a regular basis to increase amount of physical activity.;Long Term: Exercising regularly at least 3-5 days a week.;Long Term: Add in home exercise to make exercise part of routine and to increase amount of physical activity.      Increase Strength and Stamina Yes Yes      Intervention Provide advice, education, support and counseling about physical activity/exercise needs.;Develop an individualized exercise prescription for aerobic and resistive training based on initial evaluation findings, risk stratification, comorbidities and participant's personal goals. Provide advice, education, support and counseling about physical activity/exercise needs.;Develop an individualized exercise prescription for aerobic and resistive training based on initial evaluation findings, risk stratification, comorbidities and participant's personal goals.      Expected Outcomes Short Term: Increase workloads from initial exercise prescription for resistance, speed, and METs.;Short Term: Perform resistance training exercises routinely during rehab and add in resistance training at home;Long Term: Improve cardiorespiratory fitness, muscular endurance and strength as measured by increased METs and functional capacity ( ) Short Term: Increase workloads from initial exercise prescription for resistance, speed, and METs.;Short Term:  Perform resistance training exercises routinely during rehab and add in resistance training at home;Long Term: Improve cardiorespiratory fitness, muscular endurance and strength as measured by increased METs and functional capacity ( )      Able to understand and use rate of perceived exertion (RPE) scale Yes Yes      Intervention Provide education and explanation on how to use RPE scale Provide education and explanation on how to use RPE scale      Expected Outcomes Short Term: Able to use RPE daily in rehab to express subjective intensity level;Long Term:  Able to use RPE to guide intensity level when exercising independently Short Term: Able to use RPE daily in rehab to express subjective intensity level;Long Term:  Able to use RPE to guide intensity level when exercising independently      Able to understand and use Dyspnea scale -- Yes      Intervention -- Provide education and explanation on how to use Dyspnea scale      Expected Outcomes -- Short Term: Able to use Dyspnea scale daily in rehab to express subjective sense of shortness of breath during exertion;Long Term: Able to use Dyspnea  scale to guide intensity level when exercising independently      Knowledge and understanding of Target Heart Rate Range (THRR) Yes Yes      Intervention Provide education and explanation of THRR including how the numbers were predicted and where they are located for reference Provide education and explanation of THRR including how the numbers were predicted and where they are located for reference      Expected Outcomes Short Term: Able to state/look up THRR;Long Term: Able to use THRR to govern intensity when exercising independently;Short Term: Able to use daily as guideline for intensity in rehab Short Term: Able to state/look up THRR;Long Term: Able to use THRR to govern intensity when exercising independently;Short Term: Able to use daily as guideline for intensity in rehab      Understanding of Exercise  Prescription Yes Yes      Intervention Provide education, explanation, and written materials on patient's individual exercise prescription Provide education, explanation, and written materials on patient's individual exercise prescription      Expected Outcomes Short Term: Able to explain program exercise prescription;Long Term: Able to explain home exercise prescription to exercise independently Short Term: Able to explain program exercise prescription;Long Term: Able to explain home exercise prescription to exercise independently               Exercise Goals Re-Evaluation :  Exercise Goals Re-Evaluation     Row Name 07/29/23 1654 08/14/23 1623 09/04/23 1630 09/06/23 0834 10/09/23 1622     Exercise Goal Re-Evaluation   Exercise Goals Review Increase Physical Activity;Understanding of Exercise Prescription;Increase Strength and Stamina;Knowledge and understanding of Target Heart Rate Range (THRR);Able to understand and use rate of perceived exertion (RPE) scale Increase Physical Activity;Understanding of Exercise Prescription;Increase Strength and Stamina;Knowledge and understanding of Target Heart Rate Range (THRR);Able to understand and use rate of perceived exertion (RPE) scale Increase Physical Activity;Understanding of Exercise Prescription;Increase Strength and Stamina;Knowledge and understanding of Target Heart Rate Range (THRR);Able to understand and use rate of perceived exertion (RPE) scale -- Increase Physical Activity;Understanding of Exercise Prescription;Increase Strength and Stamina;Knowledge and understanding of Target Heart Rate Range (THRR);Able to understand and use rate of perceived exertion (RPE) scale   Comments Pt first day in the [facility] program. Pt tolerated exercise well with an average MET level of 2.05. Pt is learning his THRR, RPE and ExRx Reviewed MET's and goals. Pt tolerated exercise well with an average MET level of 2.1. Pt is feeling gopod about his goals and  is already increasing stamina. He is continuing to work on his balance and when he feels more confident will add in golfing and home ExRx. Until then encouraged balance training in the [facility] book. Pt is also having issues with sleep, talked over resources to help with this. Reviewed MET's and goals. Pt tolerated exercise well with an average MET level of 2.7. Pt is progressing MET's and increasing strength and stamina. He says he is feeling more fit and active. He is not yet ready to retunr to golf, but is trying to build strength to feel confident when he is ready. -- Reviewed MET's and goals. Pt tolerated exercise well with an average MET level of 2.65. Pt has been out due to illness, but it doing very well since returning. He still feels increased fatigue and some mild brain fog. He states he is keeping up with his PCP about ongoing symptoms. But as far as exercise he is back at his previous [facility] and is doing nicely.  Re-evaluated goals are to increase strength and stamina gradually.   Expected Outcomes Will continue to monitor pt and progress workloads as tolerated without sign or symptom Will continue to monitor pt and progress workloads as tolerated without sign or symptom Will continue to monitor pt and progress workloads as tolerated without sign or symptom -- Will continue to monitor pt and progress workloads as tolerated without sign or symptom    Row Name 11/15/23 1626 12/27/23 0733           Exercise Goal Re-Evaluation   Exercise Goals Review Increase Physical Activity;Understanding of Exercise Prescription;Increase Strength and Stamina;Knowledge and understanding of Target Heart Rate Range (THRR);Able to understand and use rate of perceived exertion (RPE) scale Increase Physical Activity;Able to understand and use Dyspnea scale;Understanding of Exercise Prescription;Increase Strength and Stamina;Knowledge and understanding of Target Heart Rate Range (THRR);Able to understand and use rate of  perceived exertion (RPE) scale      Comments Pt graduated the [employer] program. Pt tolerated exercise well with an average MET level of 3.25. He did very well in the program and increased his by 154 ft for a total of 1541ft. He is also gaining more enduance and balance. He plans to exercise on his own by walking, golf and going to his community fitness center for 30-60 mins 4-5 days. Also gave some info for [facility] in case he was interested Pt has not began PR yet. Will progress as tolerated.      Expected Outcomes Will continue to monitor pt and progress workloads as tolerated without sign or symptom Through exercise in rehab and at home, the patient will decrease shortness of breath with daily activities and feel confident in doing an exercise regimen at home.               Discharge Exercise Prescription (Final Exercise Prescription Changes):  Exercise Prescription Changes - 12/31/23 1500       Response to Exercise   Blood Pressure (Admit) 142/84    Blood Pressure (Exercise) 130/70    Blood Pressure (Exit) 118/68    Heart Rate (Admit) 61 bpm    Heart Rate (Exercise) 84 bpm    Heart Rate (Exit) 64 bpm    Oxygen Saturation (Admit) 97 %    Oxygen Saturation (Exercise) 98 %    Oxygen Saturation (Exit) 97 %    Rating of Perceived Exertion (Exercise) 12    Perceived Dyspnea (Exercise) 1    Duration Progress to 30 minutes of  aerobic without signs/symptoms of physical distress    Intensity THRR unchanged      Progression   Progression Continue to progress workloads to maintain intensity without signs/symptoms of physical distress.      Resistance Training   Training Prescription Yes    Weight blue bands    Reps 10-15    Time 10 Minutes      Recumbant Bike   Level 2    RPM 85    Watts 28    Minutes 15    METs 2.6      NuStep   Level 2    SPM 116    Minutes 15    METs 2.5             Nutrition:  Target Goals: Understanding of nutrition guidelines, daily  intake of sodium 1500mg , cholesterol 200mg , calories 30% from fat and 7% or less from saturated fats, daily to have 5 or more servings of fruits and vegetables.  Biometrics:  Pre Biometrics - 12/20/23 1304       Pre Biometrics   Grip Strength 30 kg             Post Biometrics - 11/08/23 1652        Post  Biometrics   Height 5\' 10"  (1.778 m)    Weight 79.4 kg    Waist Circumference 43 inches    Hip Circumference 37 inches    Waist to Hip Ratio 1.16 %    BMI (Calculated) 25.12    Triceps Skinfold 8 mm    % Body Fat 25.9 %    Grip Strength 25 kg    Single Leg Stand 10 seconds             Nutrition Therapy Plan and Nutrition Goals:  Nutrition Therapy & Goals - 01/01/24 1002       Nutrition Therapy   Diet Heart Healthy Diet    Drug/Food Interactions Statins/Certain Fruits      Personal Nutrition Goals   Nutrition Goal Patient to identify strategies for reducing cardiovascular risk by attending the [facility] education and nutrition series weekly.    Personal Goal #2 Patient to improve diet quality by using the plate method as a guide for meal planning to include lean protein/plant protein, fruits, vegetables, whole grains, nonfat dairy as part of a well-balanced diet.    Personal Goal #3 Patient to limit sodium intake to 2300mg  per day    Comments Goals in progress. Danny [name] recently graduated from cardiac rehab in February 2025 s/p TAVR. Patient with medical history of s/p TAVR, hyperlipidemia, CAD, aortic stenosis, s/p coronary artery stent placement, and CHF. Danny [name]'s LDL remains well controlled. He has excellent nutrition knowledge following completion of intensive cardiac rehab and the [facility] education series. Patient will benefit from participation in pulmonary rehab for for nutrition, exercise, and lifestyle modification.      Intervention Plan   Intervention Prescribe, educate and counsel regarding individualized specific dietary modifications aiming towards  targeted core components such as weight, hypertension, lipid management, diabetes, heart failure and other comorbidities.;Nutrition handout(s) given to patient.    Expected Outcomes Short Term Goal: Understand basic principles of dietary content, such as calories, fat, sodium, cholesterol and nutrients.;Long Term Goal: Adherence to prescribed nutrition plan.             Nutrition Assessments:  Nutrition Assessments - 12/31/23 1535       Rate Your Plate Scores   Pre Score 70            MEDIFICTS Score Key: >=70 Need to make dietary changes  40-70 Heart Healthy Diet <= 40 Therapeutic Level Cholesterol Diet  Flowsheet Row PULMONARY REHAB OTHER RESPIRATORY from 12/31/2023 in [facility]  Picture Your Plate Total Score on Admission 70      Picture Your Plate Scores: <16 Unhealthy dietary pattern with much room for improvement. 41-50 Dietary pattern unlikely to meet recommendations for good health and room for improvement. 51-60 More healthful dietary pattern, with some room for improvement.  >60 Healthy dietary pattern, although there may be some specific behaviors that could be improved.    Nutrition Goals Re-Evaluation:  Nutrition Goals Re-Evaluation     Row Name 07/29/23 1603 01/01/24 1002           Goals   Current Weight 170 lb 6.7 oz (77.3 kg) 174 lb 13.2 oz (79.3 kg)      Comment lipoproteinA WNL, lipids  WNL, lDL 56 lipoproteinA WNL, lipids WNL, LDL 62      Expected Outcome Patient with medical history of s/p TAVR, hyperlipidemia, CAD, aortic stenosis, s/p coronary artery stent placement. Danny [name]'s LDL remains well controlled. Patient will benefit from participation in intensive cardiac rehab for nutrition, exercise, and lifestyle modification. Goals in progress. Danny [name] recently graduated from cardiac rehab in February 2025 s/p TAVR. Patient with medical history of s/p TAVR, hyperlipidemia, CAD, aortic stenosis,  s/p coronary artery stent placement, and CHF. Danny [name]'s LDL remains well controlled. He has excellent nutrition knowledge following completion of intensive cardiac rehab and the [facility] education series. Patient will benefit from participation in pulmonary rehab for for nutrition, exercise, and lifestyle modification.               Nutrition Goals Discharge (Final Nutrition Goals Re-Evaluation):  Nutrition Goals Re-Evaluation - 01/01/24 1002       Goals   Current Weight 174 lb 13.2 oz (79.3 kg)    Comment lipoproteinA WNL, lipids WNL, LDL 62    Expected Outcome Goals in progress. Danny [name] recently graduated from cardiac rehab in February 2025 s/p TAVR. Patient with medical history of s/p TAVR, hyperlipidemia, CAD, aortic stenosis, s/p coronary artery stent placement, and CHF. Danny [name]'s LDL remains well controlled. He has excellent nutrition knowledge following completion of intensive cardiac rehab and the [facility] education series. Patient will benefit from participation in pulmonary rehab for for nutrition, exercise, and lifestyle modification.             Psychosocial: Target Goals: Acknowledge presence or absence of significant depression and/or stress, maximize coping skills, provide positive support system. Participant is able to verbalize types and ability to use techniques and skills needed for reducing stress and depression.  Initial Review & Psychosocial Screening:  Initial Psych Review & Screening - 12/20/23 1312       Initial Review   Current issues with None Identified      Family Dynamics   Good Support System? Yes    Comments Spouse      Barriers   Psychosocial barriers to participate in program There are no identifiable barriers or psychosocial needs.      Screening Interventions   Interventions Encouraged to exercise             Quality of Life Scores:  Quality of Life - 11/13/23 1425       Quality of Life Scores   Health/Function Post 27.2 %     Socioeconomic Post 28.21 %    Psych/Spiritual Post 30 %    Family Post 28.5 %    GLOBAL Post 28.17 %            Scores of 19 and below usually indicate a poorer quality of life in these areas.  A difference of  2-3 points is a clinically meaningful difference.  A difference of 2-3 points in the total score of the Quality of Life Index has been associated with significant improvement in overall quality of life, self-image, physical symptoms, and general health in studies assessing change in quality of life.  PHQ-9: Review Flowsheet       12/20/2023 11/15/2023 07/23/2023 02/04/2020  Depression screen PHQ 2/9  Decreased Interest 0 0 0 0  Down, Depressed, Hopeless 0 0 0 0  PHQ - 2 Score 0 0 0 0  Altered sleeping 0 0 1 -  Tired, decreased energy 0 0 1 -  Change in appetite 0 1 0 -  Feeling bad or failure  about yourself  0 0 0 -  Trouble concentrating 0 1 0 -  Moving slowly or fidgety/restless 0 0 0 -  Suicidal thoughts 0 0 0 -  PHQ-9 Score 0 2 2 -  Difficult doing work/chores Not difficult at all Not difficult at all Not difficult at all -   Interpretation of Total Score  Total Score Depression Severity:  1-4 = Minimal depression, 5-9 = Mild depression, 10-14 = Moderate depression, 15-19 = Moderately severe depression, 20-27 = Severe depression   Psychosocial Evaluation and Intervention:  Psychosocial Evaluation - 12/20/23 1313       Psychosocial Evaluation & Interventions   Interventions Encouraged to exercise with the program and follow exercise prescription    Comments Danny [name] denies any psychosocial barriers at this time.    Expected Outcomes For Danny [name] to participate in PR free of psychosocial concerns.    Continue Psychosocial Services  No Follow up required             Psychosocial Re-Evaluation:  Psychosocial Re-Evaluation     Row Name 07/30/23 0818 08/06/23 1637 08/23/23 0928 09/24/23 1204 10/18/23 1553     Psychosocial Re-Evaluation   Current issues with Current  Sleep Concerns None Identified None Identified None Identified None Identified   Comments Danny [name] did not voice any concerns or stressors on his first day of exercise Danny [name] did not voice any increased concerns or stressors during exercise at cardiac rehab Danny [name] continues not voice any increased concerns or stressors during exercise at cardiac rehab Danny [name] is currently absent as he has COVID 19. Danny [name] has returned to cardiac rehab and will complete the program on 11/15/23.   Interventions Encouraged to attend Cardiac Rehabilitation for the exercise;Relaxation education Stress management education;Encouraged to attend Cardiac Rehabilitation for the exercise;Relaxation education Stress management education;Encouraged to attend Cardiac Rehabilitation for the exercise;Relaxation education Stress management education;Encouraged to attend Cardiac Rehabilitation for the exercise;Relaxation education Stress management education;Encouraged to attend Cardiac Rehabilitation for the exercise;Relaxation education   Continue Psychosocial Services  Follow up required by staff No Follow up required No Follow up required No Follow up required No Follow up required    Row Name 12/25/23 1514             Psychosocial Re-Evaluation   Current issues with None Identified       Comments No changes since orientation. Danny [name] is scheduled to start the program on 4/15.       Expected Outcomes For Danny [name] to participate in PR free of psychosocial concerns.       Interventions Encouraged to attend Pulmonary Rehabilitation for the exercise       Continue Psychosocial Services  No Follow up required                Psychosocial Discharge (Final Psychosocial Re-Evaluation):  Psychosocial Re-Evaluation - 12/25/23 1514       Psychosocial Re-Evaluation   Current issues with None Identified    Comments No changes since orientation. Danny [name] is scheduled to start the program on 4/15.    Expected Outcomes For Danny [name] to participate in  PR free of psychosocial concerns.    Interventions Encouraged to attend Pulmonary Rehabilitation for the exercise    Continue Psychosocial Services  No Follow up required             Education: Education Goals: Education classes will be provided on a weekly basis, covering required topics. Participant will state understanding/return demonstration of topics presented.  Learning Barriers/Preferences:  Learning Barriers/Preferences -  07/23/23 1250       Learning Barriers/Preferences   Learning Barriers Sight;Hearing   pt wears glasses and bilateral hearing aids   Learning Preferences Audio;Computer/Internet;Group Instruction;Individual Instruction;Pictoral;Skilled Demonstration;Verbal Instruction;Video;Written Material             Education Topics: Know Your Numbers Group instruction that is supported by a PowerPoint presentation. Instructor discusses importance of knowing and understanding resting, exercise, and post-exercise oxygen saturation, heart rate, and blood pressure. Oxygen saturation, heart rate, blood pressure, rating of perceived exertion, and dyspnea are reviewed along with a normal range for these values.    Exercise for the Pulmonary Patient Group instruction that is supported by a PowerPoint presentation. Instructor discusses benefits of exercise, core components of exercise, frequency, duration, and intensity of an exercise routine, importance of utilizing pulse oximetry during exercise, safety while exercising, and options of places to exercise outside of rehab.    MET Level  Group instruction provided by PowerPoint, verbal discussion, and written material to support subject matter. Instructor reviews what METs are and how to increase METs.    Pulmonary Medications Verbally interactive group education provided by instructor with focus on inhaled medications and proper administration.   Anatomy and Physiology of the Respiratory System Group instruction  provided by PowerPoint, verbal discussion, and written material to support subject matter. Instructor reviews respiratory cycle and anatomical components of the respiratory system and their functions. Instructor also reviews differences in obstructive and restrictive respiratory diseases with examples of each.    Oxygen Safety Group instruction provided by PowerPoint, verbal discussion, and written material to support subject matter. There is an overview of "What is Oxygen" and "Why do we need it".  Instructor also reviews how to create a safe environment for oxygen use, the importance of using oxygen as prescribed, and the risks of noncompliance. There is a brief discussion on traveling with oxygen and resources the patient may utilize.   Oxygen Use Group instruction provided by PowerPoint, verbal discussion, and written material to discuss how supplemental oxygen is prescribed and different types of oxygen supply systems. Resources for more information are provided.    Breathing Techniques Group instruction that is supported by demonstration and informational handouts. Instructor discusses the benefits of pursed lip and diaphragmatic breathing and detailed demonstration on how to perform both.     Risk Factor Reduction Group instruction that is supported by a PowerPoint presentation. Instructor discusses the definition of a risk factor, different risk factors for pulmonary disease, and how the heart and lungs work together.   Pulmonary Diseases Group instruction provided by PowerPoint, verbal discussion, and written material to support subject matter. Instructor gives an overview of the different type of pulmonary diseases. There is also a discussion on risk factors and symptoms as well as ways to manage the diseases.   Stress and Energy Conservation Group instruction provided by PowerPoint, verbal discussion, and written material to support subject matter. Instructor gives an overview of  stress and the impact it can have on the body. Instructor also reviews ways to reduce stress. There is also a discussion on energy conservation and ways to conserve energy throughout the day.   Warning Signs and Symptoms Group instruction provided by PowerPoint, verbal discussion, and written material to support subject matter. Instructor reviews warning signs and symptoms of stroke, heart attack, cold and flu. Instructor also reviews ways to prevent the spread of infection.   Other Education Group or individual verbal, written, or video instructions that support the educational goals of  the pulmonary rehab program.    Knowledge Questionnaire Score:  Knowledge Questionnaire Score - 12/20/23 1317       Knowledge Questionnaire Score   Pre Score 15/18             Core Components/Risk Factors/Patient Goals at Admission:  Personal Goals and Risk Factors at Admission - 12/20/23 1314       Core Components/Risk Factors/Patient Goals on Admission   Improve shortness of breath with ADL's Yes    Intervention Provide education, individualized exercise plan and daily activity instruction to help decrease symptoms of SOB with activities of daily living.    Expected Outcomes Short Term: Improve cardiorespiratory fitness to achieve a reduction of symptoms when performing ADLs;Long Term: Be able to perform more ADLs without symptoms or delay the onset of symptoms             Core Components/Risk Factors/Patient Goals Review:   Goals and Risk Factor Review     Row Name 07/30/23 0819 08/06/23 1638 08/23/23 0930 09/24/23 1206 10/18/23 1555     Core Components/Risk Factors/Patient Goals Review   Personal Goals Review Weight Management/Obesity;Hypertension;Lipids Weight Management/Obesity;Hypertension;Lipids Weight Management/Obesity;Hypertension;Lipids Weight Management/Obesity;Hypertension;Lipids Weight Management/Obesity;Hypertension;Lipids   Review Danny [name] started cardiac rehab on 07/29/23.  Danny [name] did well with exercise. Vital signs were stable. Danny [name] started cardiac rehab on 07/29/23. Danny [name] is off to a good start to exercise. Vital signs have been  stable. Danny [name] is doing well with exercise at cardiac rehab. . Vital signs have been  stable. Danny [name] has lost 3.4 kg since starting cardiac rehab. Danny [name] is doing well with exercise at cardiac rehab. . Vital signs have been  stable. Danny [name] is currently absent as he has COVID 19. Danny [name] plans to return to exercise on 10/02/23. Danny [name] is doing well with exercise at cardiac rehab. . Vital signs have been  stable. Danny [name] will complete cardiac rehab on 11/15/23.   Expected Outcomes Danny [name] will continue to participate in cardiac rehab for exercise nutrition and lifestyle modifications. Danny [name] will continue to participate in cardiac rehab for exercise nutrition and lifestyle modifications. Danny [name] will continue to participate in cardiac rehab for exercise nutrition and lifestyle modifications. Danny [name] will continue to participate in cardiac rehab for exercise nutrition and lifestyle modifications. Danny [name] will continue to participate in cardiac rehab for exercise nutrition and lifestyle modifications.    Row Name 12/25/23 1515             Core Components/Risk Factors/Patient Goals Review   Personal Goals Review Develop more efficient breathing techniques such as purse lipped breathing and diaphragmatic breathing and practicing self-pacing with activity.;Improve shortness of breath with ADL's       Review Unable to assess goals yet. Danny [name] is scheduled to begin the program on 12/31/23       Expected Outcomes For Danny [name] to improve his shortness of breath with ADLs, developing more efficient breathing techniques such as purse lipped breathing and diaphragmatic breathing; and practicing self-pacing with activity                Core Components/Risk Factors/Patient Goals at Discharge (Final Review):   Goals and Risk Factor Review - 12/25/23 1515       Core  Components/Risk Factors/Patient Goals Review   Personal Goals Review Develop more efficient breathing techniques such as purse lipped breathing and diaphragmatic breathing and practicing self-pacing with activity.;Improve shortness of breath with ADL's    Review Unable to assess goals yet. Danny [name] is scheduled to begin the program on 12/31/23  Expected Outcomes For Danny [name] to improve his shortness of breath with ADLs, developing more efficient breathing techniques such as purse lipped breathing and diaphragmatic breathing; and practicing self-pacing with activity             ITP Comments:  ITP Comments     Row Name 07/30/23 0816 08/06/23 1636 08/23/23 0926 09/24/23 1201 10/18/23 1553   ITP Comments 30 Day ITP Review. Danny [name] started cardiac rehab on 07/29/23. Danny [name] did well with exercise. 30 Day ITP Review. Danny [name] started cardiac rehab on 07/29/23. Danny [name] is off to a good start to exercise. 30 Day ITP Review. Danny [name] has good attendance and participation with  exercise at cardiac rehab. 30 Day ITP Review. Danny [name] has good attendance and participation with  exercise at cardiac rehab. Danny [name] is currently absent as he has COVID 19 30 Day ITP Review. Danny [name] has good attendance and participation with exercise at cardiac rehab  since his return post COVID 19 infection.            Pt is making expected progress toward Pulmonary Rehab goals after completing 1 session(s). Recommend continued exercise, life style modification, education, and utilization of breathing techniques to increase stamina and strength, while also decreasing shortness of breath with exertion.    Comments: Dr. [name] is Medical Director for Pulmonary Rehab at [facility].

## 2024-01-02 ENCOUNTER — Encounter (HOSPITAL_COMMUNITY)
Admission: RE | Admit: 2024-01-02 | Discharge: 2024-01-02 | Disposition: A | Discharge status: Home / Self Care | Source: Ambulatory Visit | Attending: Cardiology

## 2024-01-02 DIAGNOSIS — Z952 Presence of prosthetic heart valve: Secondary | ICD-10-CM | POA: Diagnosis not present

## 2024-01-02 DIAGNOSIS — I5032 Chronic diastolic (congestive) heart failure: Secondary | ICD-10-CM | POA: Diagnosis not present

## 2024-01-02 NOTE — Progress Notes (Signed)
 Daily Session Note  Patient Details  Name: Danny [name] MRN: [id number] Date of Birth: 04-24-1936 Referring Provider:   [name] Pulmonary Rehab Walk Test from 12/20/2023 in [facility] for Heart, Vascular, & Lung Health  Referring Provider [name]       Encounter Date: 01/02/2024  Check In:  Session Check In - 01/02/24 1318       Check-In   Physician(s) Charles Connor, NP    Location [facility]    Staff Present Willard Harman, RN, Shasta Deist, MS, ACSM-CEP, Exercise Physiologist;Randi Rochelle Chu, ACSM-CEP, Exercise Physiologist;Kaheem Halleck Ena Harries, MS, Exercise Physiologist    Virtual Visit No    Medication changes reported     No    Fall or balance concerns reported    No    Tobacco Cessation No Change    Warm-up and Cool-down Performed as group-led instruction    Resistance Training Performed Yes    VAD Patient? No    PAD/SET Patient? No      Pain Assessment   Currently in Pain? No/denies    Pain Score 0-No pain    Multiple Pain Sites No             Capillary Blood Glucose: No results found for this or any previous visit (from the past 24 hours).    Social History   Tobacco Use  Smoking Status Never  Smokeless Tobacco Never    Goals Met:  Proper associated with RPD/PD & O2 Sat Independence with exercise equipment Exercise tolerated well No report of concerns or symptoms today Strength training completed today  Goals Unmet:  Not Applicable  Comments: Service time is from 1312 to 1440.    Dr. [name] is Medical Director for Pulmonary Rehab at [facility].

## 2024-01-07 ENCOUNTER — Encounter (HOSPITAL_COMMUNITY)
Admission: RE | Admit: 2024-01-07 | Discharge: 2024-01-07 | Disposition: A | Discharge status: Home / Self Care | Source: Ambulatory Visit | Attending: Cardiology | Admitting: Cardiology

## 2024-01-07 DIAGNOSIS — I5032 Chronic diastolic (congestive) heart failure: Secondary | ICD-10-CM | POA: Diagnosis not present

## 2024-01-07 DIAGNOSIS — Z952 Presence of prosthetic heart valve: Secondary | ICD-10-CM | POA: Diagnosis not present

## 2024-01-07 NOTE — Progress Notes (Signed)
 Daily Session Note  Patient Details  Name: Danny [name] MRN: [id number] Date of Birth: 1935-10-04 Referring Provider:   [name] Pulmonary Rehab Walk Test from 12/20/2023 in [facility] for Heart, Vascular, & Lung Health  Referring Provider [name]       Encounter Date: 01/07/2024  Check In:  Session Check In - 01/07/24 1436       Check-In   Supervising physician immediately available to respond to emergencies [facility] MD immediately available    Physician(s) Marlana Silvan, NP    [facility]    Staff Present Atlas Lea, MS, ACSM-CEP, Exercise Physiologist;Randi Rochelle Chu, ACSM-CEP, Exercise Physiologist;Casey Ena Harries, MS, Exercise Physiologist    Virtual Visit No    Medication changes reported     No    Fall or balance concerns reported    No    Tobacco Cessation No Change    Warm-up and Cool-down Performed as group-led instruction    Resistance Training Performed Yes    VAD Patient? No    PAD/SET Patient? No      Pain Assessment   Currently in Pain? No/denies    Pain Score 0-No pain    Multiple Pain Sites No             Capillary Blood Glucose: No results found for this or any previous visit (from the past 24 hours).    Social History   Tobacco Use  Smoking Status Never  Smokeless Tobacco Never    Goals Met:  Proper associated with RPD/PD & O2 Sat Exercise tolerated well No report of concerns or symptoms today Strength training completed today  Goals Unmet:  Not Applicable  Comments: Service time is from 1309 to 1445.    Dr. [name] is Medical Director for Pulmonary Rehab at [facility].

## 2024-01-09 ENCOUNTER — Encounter (HOSPITAL_COMMUNITY)
Admission: RE | Admit: 2024-01-09 | Discharge: 2024-01-09 | Discharge status: Home / Self Care | Source: Ambulatory Visit | Attending: Cardiology

## 2024-01-09 DIAGNOSIS — I5032 Chronic diastolic (congestive) heart failure: Secondary | ICD-10-CM | POA: Diagnosis not present

## 2024-01-09 DIAGNOSIS — Z952 Presence of prosthetic heart valve: Secondary | ICD-10-CM | POA: Diagnosis not present

## 2024-01-09 NOTE — Progress Notes (Signed)
 Daily Session Note  Patient Details  Name: Danny [name] MRN: [id number] Date of Birth: [birth year] Referring Provider:   [name] Pulmonary Rehab Walk Test from 12/20/2023 in [facility] for Heart, Vascular, & Lung Health  Referring Provider [name]       Encounter Date: 01/09/2024  Check In:  Session Check In - 01/09/24 1342       Check-In   Supervising physician immediately available to respond to emergencies [facility] MD immediately available    Physician(s) Lawana Pray, NP    [facility]    Staff Present Atlas Lea, MS, ACSM-CEP, Exercise Physiologist;Randi Rochelle Chu, ACSM-CEP, Exercise Physiologist;Kenry Daubert Carmen Chol, RN, BSN    Virtual Visit No    Medication changes reported     No    Fall or balance concerns reported    No    Tobacco Cessation No Change    Warm-up and Cool-down Performed as group-led instruction    Resistance Training Performed Yes    VAD Patient? No    PAD/SET Patient? No      Pain Assessment   Currently in Pain? No/denies    Multiple Pain Sites No             Capillary Blood Glucose: No results found for this or any previous visit (from the past 24 hours).    Social History   Tobacco Use  Smoking Status Never  Smokeless Tobacco Never    Goals Met:  Proper associated with RPD/PD & O2 Sat Independence with exercise equipment Exercise tolerated well No report of concerns or symptoms today Strength training completed today  Goals Unmet:  Not Applicable  Comments: Service time is from 1310 to 1457.    Dr. [name] is Medical Director for Pulmonary Rehab at [facility].

## 2024-01-14 ENCOUNTER — Encounter (HOSPITAL_COMMUNITY)
Admission: RE | Admit: 2024-01-14 | Discharge: 2024-01-14 | Disposition: A | Discharge status: Home / Self Care | Source: Ambulatory Visit | Attending: Cardiology | Admitting: Cardiology

## 2024-01-14 VITALS — Wt 174.6 lb

## 2024-01-14 DIAGNOSIS — I5032 Chronic diastolic (congestive) heart failure: Secondary | ICD-10-CM | POA: Diagnosis not present

## 2024-01-14 DIAGNOSIS — Z952 Presence of prosthetic heart valve: Secondary | ICD-10-CM | POA: Diagnosis not present

## 2024-01-14 NOTE — Progress Notes (Signed)
 Daily Session Note  Patient Details  Name: Danny [name] MRN: [id number] Date of Birth: [birth year] Referring Provider:   [name] Pulmonary Rehab Walk Test from 12/20/2023 in [facility] for Heart, Vascular, & Lung Health  Referring Provider [name]       Encounter Date: 01/14/2024  Check In:  Session Check In - 01/14/24 1426       Check-In   Supervising physician immediately available to respond to emergencies [facility] MD immediately available    Physician(s) Lawana Pray, NP    [facility]    Staff Present Atlas Lea, MS, ACSM-CEP, Exercise Physiologist;Randi Rochelle Chu, ACSM-CEP, Exercise Physiologist;Casey Carmen Chol, RN, BSN    Virtual Visit No    Medication changes reported     No    Fall or balance concerns reported    No    Tobacco Cessation No Change    Warm-up and Cool-down Performed as group-led instruction    Resistance Training Performed Yes    VAD Patient? No    PAD/SET Patient? No      Pain Assessment   Currently in Pain? No/denies    Multiple Pain Sites No             Capillary Blood Glucose: No results found for this or any previous visit (from the past 24 hours).   Exercise Prescription Changes - 01/14/24 1500       Response to Exercise   Blood Pressure (Admit) 112/66    Blood Pressure (Exercise) 116/66    Blood Pressure (Exit) 120/74    Heart Rate (Admit) 70 bpm    Heart Rate (Exercise) 87 bpm    Heart Rate (Exit) 67 bpm    Oxygen Saturation (Admit) 97 %    Oxygen Saturation (Exercise) 95 %    Oxygen Saturation (Exit) 94 %    Rating of Perceived Exertion (Exercise) 12    Perceived Dyspnea (Exercise) 1    Duration Continue with 30 min of aerobic exercise without signs/symptoms of physical distress.    Intensity THRR unchanged      Progression   Progression Continue to progress workloads to maintain intensity without signs/symptoms of physical distress.      Resistance  Training   Training Prescription Yes    Weight blue bands    Reps 10-15    Time 10 Minutes      Recumbant Bike   Level 2    Minutes 15    METs 2.7      NuStep   Level 2    SPM 123    Minutes 15    METs 2.8             Social History   Tobacco Use  Smoking Status Never  Smokeless Tobacco Never    Goals Met:  Independence with exercise equipment Exercise tolerated well No report of concerns or symptoms today Strength training completed today  Goals Unmet:  Not Applicable  Comments: Service time is from 1319 to 1449    Dr. [name] is Medical Director for Pulmonary Rehab at [facility].

## 2024-01-16 ENCOUNTER — Encounter (HOSPITAL_COMMUNITY)
Admission: RE | Admit: 2024-01-16 | Discharge: 2024-01-16 | Disposition: A | Discharge status: Home / Self Care | Source: Ambulatory Visit | Attending: Cardiology | Admitting: Cardiology

## 2024-01-16 ENCOUNTER — Telehealth (HOSPITAL_COMMUNITY): Payer: Self-pay | Admitting: Cardiology

## 2024-01-16 DIAGNOSIS — I5032 Chronic diastolic (congestive) heart failure: Secondary | ICD-10-CM

## 2024-01-16 DIAGNOSIS — Z952 Presence of prosthetic heart valve: Secondary | ICD-10-CM | POA: Insufficient documentation

## 2024-01-16 NOTE — Telephone Encounter (Signed)
 Patient/pts wife called with concerns of increased LE edema L>R  Reports he will take prn lasix  when weight increases however leg swelling is still present  Reports compliance with compression stockings.   Weights 4/28 172 weight 4/30 172 Normal weight 165 Denies SOB  Pts is most concern legs have not returned to normal size DVT neg 12/26/23   Please advise

## 2024-01-16 NOTE — Progress Notes (Signed)
 Daily Session Note  Patient Details  Name: Danny [name] MRN: [id number] Date of Birth: [birth year] Referring Provider:   [name] Pulmonary Rehab Walk Test from 12/20/2023 in [facility] for Heart, Vascular, & Lung Health  Referring Provider [name]       Encounter Date: 01/16/2024  Check In:  Session Check In - 01/16/24 1359       Check-In   Supervising physician immediately available to respond to emergencies [facility] MD immediately available    Physician(s) Koren Persons, NP    [facility]    Staff Present Atlas Lea, MS, ACSM-CEP, Exercise Physiologist;Randi Rochelle Chu, ACSM-CEP, Exercise Physiologist;Kolbee Stallman Felipe Horton, Graig Lawyer, RN, Mercedes Stake, RN, BSN    Virtual Visit No    Medication changes reported     No    Fall or balance concerns reported    No    Tobacco Cessation No Change    Warm-up and Cool-down Performed as group-led Writer Performed Yes    VAD Patient? No    PAD/SET Patient? No      Pain Assessment   Currently in Pain? No/denies    Multiple Pain Sites No             Capillary Blood Glucose: No results found for this or any previous visit (from the past 24 hours).    Social History   Tobacco Use  Smoking Status Never  Smokeless Tobacco Never    Goals Met:  Proper associated with RPD/PD & O2 Sat Independence with exercise equipment Exercise tolerated well No report of concerns or symptoms today Strength training completed today  Goals Unmet:  Not Applicable  Comments: Service time is from 1317 to 1445.    Dr. [name] is Medical Director for Pulmonary Rehab at [facility].

## 2024-01-16 NOTE — Telephone Encounter (Signed)
 Pt aware via wife and understanding

## 2024-01-21 ENCOUNTER — Encounter (HOSPITAL_COMMUNITY)
Admission: RE | Admit: 2024-01-21 | Discharge: 2024-01-21 | Disposition: A | Discharge status: Home / Self Care | Source: Ambulatory Visit | Attending: Cardiology

## 2024-01-21 DIAGNOSIS — I5032 Chronic diastolic (congestive) heart failure: Secondary | ICD-10-CM | POA: Diagnosis not present

## 2024-01-21 DIAGNOSIS — Z952 Presence of prosthetic heart valve: Secondary | ICD-10-CM | POA: Diagnosis not present

## 2024-01-21 NOTE — Progress Notes (Signed)
 Daily Session Note  Patient Details  Name: Danny [name] MRN: [id number] Date of Birth: 29-Jun-1936 Referring Provider:   [name] Pulmonary Rehab Walk Test from 12/20/2023 in [facility] for Heart, Vascular, & Lung Health  Referring Provider [name]       Encounter Date: 01/21/2024  Check In:  Session Check In - 01/21/24 1325       Check-In   Supervising physician immediately available to respond to emergencies [facility] MD immediately available    Physician(s) Theresia Flasher, NP    [facility]    Staff Present Atlas Lea, MS, ACSM-CEP, Exercise Physiologist;Casey Carmen Chol, RN, BSN;Johnny Alexia Angelucci, MS, Exercise Physiologist;Randi Regis Captain BS, ACSM-CEP, Exercise Physiologist;Samantha Belarus, RD, LDN    Virtual Visit No    Medication changes reported     No    Fall or balance concerns reported    No    Tobacco Cessation No Change    Warm-up and Cool-down Performed as group-led instruction    Resistance Training Performed Yes    VAD Patient? No    PAD/SET Patient? No      Pain Assessment   Currently in Pain? No/denies    Multiple Pain Sites No             Capillary Blood Glucose: No results found for this or any previous visit (from the past 24 hours).    Social History   Tobacco Use  Smoking Status Never  Smokeless Tobacco Never    Goals Met:  Proper associated with RPD/PD & O2 Sat Independence with exercise equipment Exercise tolerated well No report of concerns or symptoms today Strength training completed today  Goals Unmet:  Not Applicable  Comments: Service time is from 1312 to 1443.    Dr. [name] is Medical Director for Pulmonary Rehab at [facility].

## 2024-01-23 ENCOUNTER — Encounter (HOSPITAL_COMMUNITY)
Admission: RE | Admit: 2024-01-23 | Discharge: 2024-01-23 | Disposition: A | Discharge status: Home / Self Care | Source: Ambulatory Visit | Attending: Cardiology | Admitting: Cardiology

## 2024-01-23 DIAGNOSIS — Z952 Presence of prosthetic heart valve: Secondary | ICD-10-CM | POA: Diagnosis not present

## 2024-01-23 DIAGNOSIS — I5032 Chronic diastolic (congestive) heart failure: Secondary | ICD-10-CM

## 2024-01-23 NOTE — Progress Notes (Deleted)
 Daily Session Note  Patient Details  Name: Danny [name] MRN: [id number] Date of Birth: Apr 28, 1936 Referring Provider:   [name] Pulmonary Rehab Walk Test from 12/20/2023 in [facility] for Heart, Vascular, & Lung Health  Referring Provider [name]       Encounter Date: 01/23/2024  Check In:  Session Check In - 01/23/24 1335       Check-In   Supervising physician immediately available to respond to emergencies [facility] MD immediately available    Physician(s) Levin Reamer, NP    [facility]    Staff Present Atlas Lea, MS, ACSM-CEP, Exercise Physiologist;Casey Carmen Chol, RN, BSN;Randi Reeve BS, ACSM-CEP, Exercise Physiologist    Virtual Visit No    Medication changes reported     No    Fall or balance concerns reported    No    Tobacco Cessation No Change    Warm-up and Cool-down Performed as group-led instruction    Resistance Training Performed Yes    VAD Patient? No    PAD/SET Patient? No      Pain Assessment   Currently in Pain? No/denies    Multiple Pain Sites No             Capillary Blood Glucose: No results found for this or any previous visit (from the past 24 hours).    Social History   Tobacco Use  Smoking Status Never  Smokeless Tobacco Never    Goals Met:  Independence with exercise equipment Exercise tolerated well No report of concerns or symptoms today Strength training completed today  Goals Unmet:  Not Applicable  Comments: Service time is from 1318 to 1451    Dr. [name] is Medical Director for Pulmonary Rehab at [facility].

## 2024-01-23 NOTE — Progress Notes (Signed)
 Daily Session Note  Patient Details  Name: Danny [name] MRN: [id number] Date of Birth: 27-Sep-1935 Referring Provider:   [name] Pulmonary Rehab Walk Test from 12/20/2023 in [facility] for Heart, Vascular, & Lung Health  Referring Provider [name]       Encounter Date: 01/23/2024  Check In:  Session Check In - 01/23/24 1335       Check-In   Supervising physician immediately available to respond to emergencies [facility] MD immediately available    Physician(s) Levin Reamer, NP    Location [facility]    Staff Present Atlas Lea, MS, ACSM-CEP, Exercise Physiologist;Casey Carmen Chol, RN, BSN;Randi Reeve BS, ACSM-CEP, Exercise Physiologist    Virtual Visit No    Medication changes reported     No    Fall or balance concerns reported    No    Tobacco Cessation No Change    Warm-up and Cool-down Performed as group-led instruction    Resistance Training Performed Yes    VAD Patient? No    PAD/SET Patient? No      Pain Assessment   Currently in Pain? No/denies    Multiple Pain Sites No             Capillary Blood Glucose: No results found for this or any previous visit (from the past 24 hours).    Social History   Tobacco Use  Smoking Status Never  Smokeless Tobacco Never    Goals Met:  Proper associated with RPD/PD & O2 Sat Independence with exercise equipment Exercise tolerated well No report of concerns or symptoms today Strength training completed today  Goals Unmet:  Not Applicable  Comments: Service time is from 1318 to 1451.    Dr. [name] is Medical Director for Pulmonary Rehab at [facility].

## 2024-01-24 ENCOUNTER — Ambulatory Visit (HOSPITAL_COMMUNITY)
Admission: RE | Admit: 2024-01-24 | Discharge: 2024-01-24 | Disposition: A | Discharge status: Home / Self Care | Source: Ambulatory Visit | Attending: Internal Medicine | Admitting: Internal Medicine

## 2024-01-24 DIAGNOSIS — I5032 Chronic diastolic (congestive) heart failure: Secondary | ICD-10-CM | POA: Insufficient documentation

## 2024-01-24 LAB — COMPREHENSIVE METABOLIC PANEL WITH GFR
ALT: 20 U/L (ref 0–44)
AST: 24 U/L (ref 15–41)
Albumin: 3.7 g/dL (ref 3.5–5.0)
Alkaline Phosphatase: 49 U/L (ref 38–126)
Anion gap: 7 (ref 5–15)
BUN: 14 mg/dL (ref 8–23)
CO2: 28 mmol/L (ref 22–32)
Calcium: 9.7 mg/dL (ref 8.9–10.3)
Chloride: 105 mmol/L (ref 98–111)
Creatinine, Ser: 0.98 mg/dL (ref 0.61–1.24)
GFR, Estimated: >60 mL/min (ref >60)
Glucose, Bld: 109 mg/dL — ABNORMAL HIGH (ref 70–99)
Potassium: 4.6 mmol/L (ref 3.5–5.1)
Sodium: 140 mmol/L (ref 135–145)
Total Bilirubin: 1.1 mg/dL (ref 0.0–1.2)
Total Protein: 6 g/dL — ABNORMAL LOW (ref 6.5–8.1)

## 2024-01-28 ENCOUNTER — Encounter (HOSPITAL_COMMUNITY)
Admission: RE | Admit: 2024-01-28 | Discharge: 2024-01-28 | Disposition: A | Discharge status: Home / Self Care | Source: Ambulatory Visit | Attending: Cardiology | Admitting: Cardiology

## 2024-01-28 VITALS — Wt 175.0 lb

## 2024-01-28 DIAGNOSIS — I5032 Chronic diastolic (congestive) heart failure: Secondary | ICD-10-CM

## 2024-01-28 DIAGNOSIS — Z952 Presence of prosthetic heart valve: Secondary | ICD-10-CM | POA: Diagnosis not present

## 2024-01-28 NOTE — Progress Notes (Signed)
 Daily Session Note  Patient Details  Name: Danny [name] MRN: [id number] Date of Birth: 21-Jan-1936 Referring Provider:   [name] Pulmonary Rehab Walk Test from 12/20/2023 in [facility] for Heart, Vascular, & Lung Health  Referring Provider [name]       Encounter Date: 01/28/2024  Check In:  Session Check In - 01/28/24 1405       Check-In   Supervising physician immediately available to respond to emergencies [facility] MD immediately available    Physician(s) Levin Reamer, NP    [facility]    Staff Present Atlas Lea, MS, ACSM-CEP, Exercise Physiologist;Casey Carmen Chol, RN, BSN;Randi Reeve BS, ACSM-CEP, Exercise Physiologist    Virtual Visit No    Medication changes reported     No    Fall or balance concerns reported    No    Tobacco Cessation No Change    Warm-up and Cool-down Performed as group-led instruction    Resistance Training Performed Yes    VAD Patient? No    PAD/SET Patient? No      Pain Assessment   Currently in Pain? No/denies    Pain Score 0-No pain    Multiple Pain Sites No             Capillary Blood Glucose: No results found for this or any previous visit (from the past 24 hours).   Exercise Prescription Changes - 01/28/24 1400       Response to Exercise   Blood Pressure (Admit) 117/66    Blood Pressure (Exercise) 116/74    Blood Pressure (Exit) 120/66    Heart Rate (Admit) 56 bpm    Heart Rate (Exercise) 85 bpm    Heart Rate (Exit) 57 bpm    Oxygen Saturation (Admit) 99 %    Oxygen Saturation (Exercise) 96 %    Oxygen Saturation (Exit) 97 %    Rating of Perceived Exertion (Exercise) 12    Perceived Dyspnea (Exercise) 1    Duration Continue with 30 min of aerobic exercise without signs/symptoms of physical distress.    Intensity THRR unchanged      Progression   Progression Continue to progress workloads to maintain intensity without signs/symptoms of physical  distress.      Resistance Training   Training Prescription Yes    Weight blue bands    Reps 10-15    Time 10 Minutes      Recumbant Bike   Level 3    Minutes 15    METs 3.3      NuStep   Level 3    Minutes 15    METs 2.4             Social History   Tobacco Use  Smoking Status Never  Smokeless Tobacco Never    Goals Met:  Independence with exercise equipment Exercise tolerated well No report of concerns or symptoms today Strength training completed today  Goals Unmet:  Not Applicable  Comments: Service time is from 1314 to 1431    Dr. [name] is Medical Director for Pulmonary Rehab at [facility].

## 2024-01-29 NOTE — Progress Notes (Signed)
 Pulmonary Individual Treatment Plan  Patient Details  Name: Danny [name] MRN: [id number] Date of Birth: 05-16-1936 Referring Provider:   [name] Pulmonary Rehab Walk Test from 12/20/2023 in [facility]  Referring Provider [facility]       Initial Encounter Date:  Flowsheet Row Pulmonary Rehab Walk Test from 12/20/2023 in [facility]  Date 12/20/23       Visit Diagnosis: Heart failure, diastolic, chronic (HCC)  Patient's Home Medications on Admission:   Current Outpatient Medications:    amoxicillin  (AMOXIL ) 500 MG capsule, Take 4 capsules (2,000 mg total) by mouth once as needed for up to 1 dose. 1 HOUR PRIOR TO DENTAL CLEANINGS, Disp: 4 capsule, Rfl: 4   aspirin  EC 81 MG tablet, Take 81 mg by mouth in the morning., Disp: , Rfl:    betamethasone  dipropionate 0.05 % cream, Apply 0.5 application  topically 2 (two) times daily as needed (dry/irritated skin)., Disp: , Rfl:    Calcium -Magnesium  (CAL-MAG PO), Take 1 tablet by mouth in the morning. With Potassium 500 mg /500 mg /99 mg, Disp: , Rfl:    Cholecalciferol (VITAMIN D3) 250 MCG (10000 UT) capsule, Take 10,000 Units by mouth in the morning, at noon, and at bedtime., Disp: , Rfl:    clopidogrel  (PLAVIX ) 75 MG tablet, Take 1 tablet (75 mg total) by mouth daily with breakfast., Disp: 90 tablet, Rfl: 3   Coenzyme Q10 (CO Q 10 PO), Take 400 mg by mouth in the morning., Disp: , Rfl:    finasteride  (PROSCAR ) 5 MG tablet, Take 5 mg by mouth in the morning., Disp: , Rfl:    furosemide  (LASIX ) 20 MG tablet, Take 1 tablet (20 mg total) by mouth as needed (For weight gain of 3 lbs in 24 hours or 5 lbs in a week)., Disp: 30 tablet, Rfl: 3   gabapentin  (NEURONTIN ) 100 MG capsule, Take 1 capsule (100 mg total) by mouth daily as needed., Disp: 30 capsule, Rfl: 5   gabapentin  (NEURONTIN ) 600 MG tablet, TAKE 1 TABLET AT 4PM AND  TAKE 1 TABLET AT BEDTIME., Disp: 180 tablet, Rfl: 3   Magnesium  Oxide 400 MG CAPS, Take 1 capsule (400 mg total) by mouth daily., Disp: 30 capsule, Rfl: 11   metoprolol  succinate (TOPROL -XL) 25 MG 24 hr tablet, TAKE ONE TABLET BY MOUTH DAILY, Disp: 90 tablet, Rfl: 3   Multiple Vitamin (MULTIVITAMIN WITH MINERALS) TABS tablet, Take 1 tablet by mouth daily. Centrum Silver, Disp: , Rfl:    Multiple Vitamins-Minerals (PRESERVISION AREDS 2) CAPS, Take 1 capsule by mouth in the morning and at bedtime., Disp: , Rfl:    nitroGLYCERIN  (NITROSTAT ) 0.4 MG SL tablet, ONE TABLET UNDER TONGUE WHEN NEEDED FOR CHEST PAIN. MAY REPEAT IN 5 MINUTES., Disp: 25 tablet, Rfl: 6   pantoprazole  (PROTONIX ) 40 MG tablet, Take 1 tablet (40 mg total) by mouth daily., Disp: 90 tablet, Rfl: 3   Probiotic Product (PROBIOTIC-10 PO), Take 1 capsule by mouth in the morning., Disp: , Rfl:    Propylene Glycol (SYSTANE BALANCE OP), Apply 1 drop to eye as needed., Disp: , Rfl:    rosuvastatin  (CRESTOR ) 20 MG tablet, Take 1 tablet (20 mg total) by mouth daily., Disp: 90 tablet, Rfl: 3   Saw Palmetto 160 MG CAPS, Take 160 mg by mouth 2 (two) times daily., Disp: , Rfl:    traMADol  (ULTRAM ) 50 MG tablet, Take 50 mg  by mouth 3 (three) times daily as needed (back pain.)., Disp: , Rfl:    traZODone  (DESYREL ) 50 MG tablet, Take 25 mg by mouth at bedtime as needed for sleep., Disp: , Rfl:    Turmeric (QC TUMERIC COMPLEX) 500 MG CAPS, Take 500 mg by mouth in the morning., Disp: , Rfl:    zolpidem  (AMBIEN ) 10 MG tablet, Take 10 mg by mouth at bedtime., Disp: , Rfl:   Past Medical History: Past Medical History:  Diagnosis Date   Allergic rhinitis    Arthritis    BPH (benign prostatic hypertrophy)    Dr. Rozanne Corners   Cervical disc disease    Coronary artery disease    Patient developed exertional dyspnea. ETT-myoview  (8/12) with 11 METS, normal LV EF, normal perfusion at rest and stress but elevated TID ratio. Coronary CT angiogram in 9/12 showed  equivocal significant disease in the CFX. Cath in 10/12 showed EF 55-60%, 30% mid RCA, 40% distal RCA, 40% proximal LAD. ETT-Myoview  (10/14): Diaphragmatic attenuation, no ischemia, EF 62%, normal study   Detached vitreous humor    Diverticulosis    GERD (gastroesophageal reflux disease)    Hemorrhoids    Impaired fasting glucose    Ingrown toenail    Lumbar radiculopathy    Numbness and tingling in left arm    Onychomycosis    Rotator cuff disorder    S/P TAVR (transcatheter aortic valve replacement) 06/11/2023   s/p TAVR with a 26 mm Edwards S3UR via the TF approach by Dr. 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    Tobacco Use: Social History   Tobacco Use  Smoking Status Never  Smokeless Tobacco Never    Labs: Review Flowsheet  More data exists      Latest Ref Rng & Units 08/09/2020 08/15/2021 08/23/2022 05/02/2023 09/13/2023  Labs for ITP Cardiac and Pulmonary Rehab  Cholestrol 0 - 200 mg/dL 161  096  045  - 409   LDL (calc) 0 - 99 mg/dL 68  45  56  - 62   HDL-C >40 mg/dL 68  60  59  - 53   Trlycerides <150 mg/dL 55  43  73  - 811   Bicarbonate 20.0 - 28.0 mmol/L - - - 24.9  24.3  -  TCO2 22 - 32 mmol/L - - - 26  26  -  Acid-base deficit 0.0 - 2.0 mmol/L - - - 1.0  1.0  -  O2 Saturation % - - - 72  69  -    Details       Multiple values from one day are sorted in reverse-chronological order         Capillary Blood Glucose: No results found for: "GLUCAP"   Pulmonary Assessment Scores:  Pulmonary Assessment Scores     Row Name 12/20/23 1424         ADL UCSD   ADL Phase Entry     SOB Score total 32       CAT Score   CAT Score 12       mMRC Score   mMRC Score 2             UCSD: Self-administered rating of dyspnea associated with activities of daily living (ADLs) 6-point scale (0 = "not at all" to 5 = "maximal or unable to do because of breathlessness")  Scoring Scores range from 0 to 120.  Minimally  important difference is 5 units  CAT: CAT  can identify the health impairment of COPD patients and is better correlated with disease progression.  CAT has a scoring range of zero to 40. The CAT score is classified into four groups of low (less than 10), medium (10 - 20), high (21-30) and very high (31-40) based on the impact level of disease on health status. A CAT score over 10 suggests significant symptoms.  A worsening CAT score could be explained by an exacerbation, poor medication adherence, poor inhaler technique, or progression of COPD or comorbid conditions.  CAT MCID is 2 points  mMRC: mMRC (Modified Medical Research Council) Dyspnea Scale is used to assess the degree of baseline functional disability in patients of respiratory disease due to dyspnea. No minimal important difference is established. A decrease in score of 1 point or greater is considered a positive change.   Pulmonary Function Assessment:  Pulmonary Function Assessment - 12/20/23 1320       Breath   Shortness of Breath Yes;Limiting activity             Exercise Target Goals: Exercise Program Goal: Individual exercise prescription set using results from initial 6 min walk test and THRR while considering  patient's activity barriers and safety.   Exercise Prescription Goal: Initial exercise prescription builds to 30-45 minutes a day of aerobic activity, 2-3 days per week.  Home exercise guidelines will be given to patient during program as part of exercise prescription that the participant will acknowledge.  Activity Barriers & Risk Stratification:  Activity Barriers & Cardiac Risk Stratification - 12/20/23 1315       Activity Barriers & Cardiac Risk Stratification   Activity Barriers Balance Concerns;Arthritis;Joint Problems;Back Problems;Deconditioning;Neck/Spine Problems             6 Minute Walk:  6 Minute Walk     Row Name 11/08/23 1651 12/20/23 1409       6 Minute Walk   Phase Discharge  Initial    Distance 1540 feet 1502 feet    Distance % Change 11.11 % --    Distance Feet Change 154 ft --    Walk Time 6 minutes 6 minutes    # of Rest Breaks 0 0    MPH 2.92 2.84    METS 2.61 2.45    RPE 11 11    Perceived Dyspnea  0 1    VO2 Peak 9.12 8.56    Symptoms No No    Resting HR 65 bpm 69 bpm    Resting BP 120/72 110/66    Resting Oxygen Saturation  -- 98 %    Exercise Oxygen Saturation  during 6 min walk -- 90 %    Max Ex. HR 91 bpm 87 bpm    Max Ex. BP 130/80 128/66    2 Minute Post BP 120/70 122/70      Interval HR   1 Minute HR -- 69    2 Minute HR -- 79    3 Minute HR -- 75    4 Minute HR -- 80    5 Minute HR -- 77    6 Minute HR -- 87    2 Minute Post HR -- 71    Interval Heart Rate? -- Yes      Interval Oxygen   Interval Oxygen? -- Yes    Baseline Oxygen Saturation % -- 98 %    1 Minute Oxygen Saturation % -- 93 %    1 Minute Liters of Oxygen -- 0 L  2 Minute Oxygen Saturation % -- 91 %    2 Minute Liters of Oxygen -- 0 L    3 Minute Oxygen Saturation % -- 95 %    3 Minute Liters of Oxygen -- 0 L    4 Minute Oxygen Saturation % -- 90 %    4 Minute Liters of Oxygen -- 0 L    5 Minute Oxygen Saturation % -- 97 %    5 Minute Liters of Oxygen -- 0 L    6 Minute Oxygen Saturation % -- 90 %    6 Minute Liters of Oxygen -- 0 L    2 Minute Post Oxygen Saturation % -- 93 %    2 Minute Post Liters of Oxygen -- 0 L             Oxygen Initial Assessment:  Oxygen Initial Assessment - 12/20/23 1316       Home Oxygen   Home Oxygen Device None    Sleep Oxygen Prescription None    Home Exercise Oxygen Prescription None    Home Resting Oxygen Prescription None      Initial 6 min Walk   Oxygen Used None      Program Oxygen Prescription   Program Oxygen Prescription None      Intervention   Short Term Goals To learn and understand importance of maintaining oxygen saturations>88%;To learn and demonstrate proper use of respiratory medications;To  learn and understand importance of monitoring SPO2 with pulse oximeter and demonstrate accurate use of the pulse oximeter.;To learn and demonstrate proper pursed lip breathing techniques or other breathing techniques.     Long  Term Goals Verbalizes importance of monitoring SPO2 with pulse oximeter and return demonstration;Maintenance of O2 saturations>88%;Exhibits proper breathing techniques, such as pursed lip breathing or other method taught during program session;Compliance with respiratory medication;Demonstrates proper use of MDI's             Oxygen Re-Evaluation:  Oxygen Re-Evaluation     Row Name 12/27/23 0732 01/22/24 1142           Program Oxygen Prescription   Program Oxygen Prescription None None        Home Oxygen   Home Oxygen Device None None      Sleep Oxygen Prescription None None      Home Exercise Oxygen Prescription None None      Home Resting Oxygen Prescription None None        Goals/Expected Outcomes   Short Term Goals To learn and understand importance of maintaining oxygen saturations>88%;To learn and demonstrate proper use of respiratory medications;To learn and understand importance of monitoring SPO2 with pulse oximeter and demonstrate accurate use of the pulse oximeter.;To learn and demonstrate proper pursed lip breathing techniques or other breathing techniques.  To learn and understand importance of maintaining oxygen saturations>88%;To learn and demonstrate proper use of respiratory medications;To learn and understand importance of monitoring SPO2 with pulse oximeter and demonstrate accurate use of the pulse oximeter.;To learn and demonstrate proper pursed lip breathing techniques or other breathing techniques.       Long  Term Goals Verbalizes importance of monitoring SPO2 with pulse oximeter and return demonstration;Maintenance of O2 saturations>88%;Exhibits proper breathing techniques, such as pursed lip breathing or other method taught during program  session;Compliance with respiratory medication;Demonstrates proper use of MDI's Verbalizes importance of monitoring SPO2 with pulse oximeter and return demonstration;Maintenance of O2 saturations>88%;Exhibits proper breathing techniques, such as pursed lip breathing or other method taught during program  session;Compliance with respiratory medication;Demonstrates proper use of MDI's      Goals/Expected Outcomes Compliance and understanding of oxygen saturation monitoring and breath techniques to decrease shortness of breath. Compliance and understanding of oxygen saturation monitoring and breath techniques to decrease shortness of breath.               Oxygen Discharge (Final Oxygen Re-Evaluation):  Oxygen Re-Evaluation - 01/22/24 1142       Program Oxygen Prescription   Program Oxygen Prescription None      Home Oxygen   Home Oxygen Device None    Sleep Oxygen Prescription None    Home Exercise Oxygen Prescription None    Home Resting Oxygen Prescription None      Goals/Expected Outcomes   Short Term Goals To learn and understand importance of maintaining oxygen saturations>88%;To learn and demonstrate proper use of respiratory medications;To learn and understand importance of monitoring SPO2 with pulse oximeter and demonstrate accurate use of the pulse oximeter.;To learn and demonstrate proper pursed lip breathing techniques or other breathing techniques.     Long  Term Goals Verbalizes importance of monitoring SPO2 with pulse oximeter and return demonstration;Maintenance of O2 saturations>88%;Exhibits proper breathing techniques, such as pursed lip breathing or other method taught during program session;Compliance with respiratory medication;Demonstrates proper use of MDI's    Goals/Expected Outcomes Compliance and understanding of oxygen saturation monitoring and breath techniques to decrease shortness of breath.             Initial Exercise Prescription:  Initial Exercise  Prescription - 12/20/23 1400       Date of Initial Exercise RX and Referring Provider   Date 12/20/23    Referring Provider [facility]    Expected Discharge Date 03/17/24      Recumbant Bike   Level 2    RPM 70    Watts 40    Minutes 15    METs 3.5      NuStep   Level 1    SPM 100    Minutes 15    METs 2.5      Intensity   THRR 40-80% of Max Heartrate 54-108    Ratings of Perceived Exertion 11-13    Perceived Dyspnea 0-4      Progression   Progression Continue progressive overload as per policy without signs/symptoms or physical distress.      Resistance Training   Training Prescription Yes    Weight blue bands    Reps 10-15             Perform Capillary Blood Glucose checks as needed.  Exercise Prescription Changes:   Exercise Prescription Changes     Row Name 08/14/23 1620 09/06/23 0832 10/09/23 1620 10/23/23 1613 11/15/23 1622     Response to Exercise   Blood Pressure (Admit) 120/82 134/80 120/64 120/70 118/70   Blood Pressure (Exercise) 128/72 -- 128/72 -- --   Blood Pressure (Exit) 128/78 124/80 104/72 108/72 136/80   Heart Rate (Admit) 64 bpm 69 bpm 54 bpm 55 bpm 63 bpm   Heart Rate (Exercise) 81 bpm 111 bpm 92 bpm 105 bpm 106 bpm   Heart Rate (Exit) 66 bpm 75 bpm 63 bpm 64 bpm 83 bpm   Rating of Perceived Exertion (Exercise) 11 12 12 12 12    Perceived Dyspnea (Exercise) 0 0 0 0 0   Symptoms none none none none none   Comments Reviewed MET's and goals Reviewed MET's and goals Reviewed MET's and goals Reviewed MET's Pt graduated  the pritikin ICR   Duration Progress to 30 minutes of  aerobic without signs/symptoms of physical distress Progress to 30 minutes of  aerobic without signs/symptoms of physical distress Progress to 30 minutes of  aerobic without signs/symptoms of physical distress Progress to 30 minutes of  aerobic without signs/symptoms of physical distress Progress to 30 minutes of  aerobic without signs/symptoms of physical distress   Intensity  THRR unchanged THRR unchanged THRR unchanged THRR unchanged THRR unchanged     Progression   Progression Continue to progress workloads to maintain intensity without signs/symptoms of physical distress. Continue to progress workloads to maintain intensity without signs/symptoms of physical distress. Continue to progress workloads to maintain intensity without signs/symptoms of physical distress. Continue to progress workloads to maintain intensity without signs/symptoms of physical distress. Continue to progress workloads to maintain intensity without signs/symptoms of physical distress.   Average METs 2.1 2.7 2.65 2.9 3.25     Resistance Training   Training Prescription No No No No Yes   Weight 3 lbs 3 lbs 3 lbs 3 lbs 3 lbs   Reps 10-15 10-15 10-15 10-15 10-15   Time 10 Minutes 10 Minutes 10 Minutes 10 Minutes 10 Minutes     Recumbant Bike   Level 2 2 2 3 3    RPM 72 82 81 90 74   Watts 20 32 28 31 43   Minutes 15 15 15 15 15    METs 2.3 2.8 3 2.8 3.7     NuStep   Level -- 1 1 2 2    SPM -- 120 134 145 130   Minutes -- 15 15 15 15    METs -- 2.6 2.3 3 3      Arm Ergometer   Level 1.5 -- -- -- --   Minutes 15 -- -- -- --   METs 1.9 -- -- -- --     Home Exercise Plan   Plans to continue exercise at -- -- -- [employer] (comment) [employer] (comment)   Frequency -- -- -- Add 2 additional days to program exercise sessions. Add 2 additional days to program exercise sessions.   Initial Home Exercises Provided -- -- -- 10/23/23 10/23/23    Row Name 12/31/23 1500 01/14/24 1500 01/28/24 1400         Response to Exercise   Blood Pressure (Admit) 142/84 112/66 117/66     Blood Pressure (Exercise) 130/70 116/66 116/74     Blood Pressure (Exit) 118/68 120/74 120/66     Heart Rate (Admit) 61 bpm 70 bpm 56 bpm     Heart Rate (Exercise) 84 bpm 87 bpm 85 bpm     Heart Rate (Exit) 64 bpm 67 bpm 57 bpm     Oxygen Saturation (Admit) 97 % 97 % 99 %     Oxygen Saturation  (Exercise) 98 % 95 % 96 %     Oxygen Saturation (Exit) 97 % 94 % 97 %     Rating of Perceived Exertion (Exercise) 12 12 12      Perceived Dyspnea (Exercise) 1 1 1      Duration Progress to 30 minutes of  aerobic without signs/symptoms of physical distress Continue with 30 min of aerobic exercise without signs/symptoms of physical distress. Continue with 30 min of aerobic exercise without signs/symptoms of physical distress.     Intensity THRR unchanged THRR unchanged THRR unchanged       Progression   Progression Continue to progress workloads to maintain intensity without signs/symptoms of physical distress.  Continue to progress workloads to maintain intensity without signs/symptoms of physical distress. Continue to progress workloads to maintain intensity without signs/symptoms of physical distress.       Resistance Training   Training Prescription Yes Yes Yes     Weight blue bands blue bands blue bands     Reps 10-15 10-15 10-15     Time 10 Minutes 10 Minutes 10 Minutes       Recumbant Bike   Level 2 2 3      RPM 85 -- --     Watts 28 -- --     Minutes 15 15 15      METs 2.6 2.7 3.3       NuStep   Level 2 2 3      SPM 116 123 --     Minutes 15 15 15      METs 2.5 2.8 2.4              Exercise Comments:   Exercise Comments     Row Name 08/14/23 1626 09/04/23 0836 09/04/23 1630 09/23/23 1048 10/07/23 1619   Exercise Comments Reviewed MET's and goals. Pt tolerated exercise well with an average MET level of 2.1. Pt is feeling gopod about his goals and is already increasing stamina. He is continuing to work on his balance and when he feels more confident will add in golfing and home ExRx. Until then encouraged balance training in the [employer] book. Pt is also having issues with sleep, talked over resources to help with this. -- Reviewed MET's and goals. Pt tolerated exercise well with an average MET level of 2.7. Pt is progressing MET's and increasing strength and stamina. He says he  is feeling more fit and active. He is not yet ready to retunr to golf, but is trying to build strength to feel confident when he is ready. Patient was absent for holiday, but now has COVID, will review education with patient when he is well and can return to cardiac rehab. Patient returned for his first day of exercise since illness, DEC WL for today to reacclimate. Will review education soon. Patient tolerated exercise well and felt good. MET AVG today 2.6.    Row Name 10/09/23 1625 10/23/23 1616 11/15/23 1630 12/31/23 1518     Exercise Comments Reviewed MET's and goals. Pt tolerated exercise well with an average MET level of 2.65. Pt has been out due to illness, but it doing very well since returning. He still feels increased fatigue and some mild brain fog. He states he is keeping up with his PCP about ongoing symptoms. But as far as exercise he is back at his previous WL and is doing nicely. Re-evaluated goals are to increase strength and stamina gradually. Reviewed MET's and home exercise. Pt tolerated exercise well with an average MET level of 2.9. Pt has been doing well. Talked about gradual increase today and moved up WL. He is feeling good and is walking some on his own. He belongs to a community gym so encouraged 1-2 days for 30-45 mins of walking or going to the community gym. Pt graduated the [employer]. Pt tolerated exercise well with an average MET level of 3.25. He did very well in the program and increased his by 154 ft for a total of 1558ft. He is also gaining more enduance and balance. He plans to exercise on his own by walking, golf and going to his community fitness center for 30-60 mins 4-5 days. Also gave some  info for [employer] in case he was interested Pt completed his first day of group exercise in Pulmonary Rehab. He exercised on the recumbent stepper for 15 min, level 2, METs 2.5. He then exercised on the recumbent bike for 15 min, level 2, METs 2.6. Tolerated well,  performing warm up and cool down without significant limitations. Discussed METs, performing squats.             Exercise Goals and Review:   Exercise Goals     Row Name 12/20/23 1316             Exercise Goals   Increase Physical Activity Yes       Intervention Provide advice, education, support and counseling about physical activity/exercise needs.;Develop an individualized exercise prescription for aerobic and resistive training based on initial evaluation findings, risk stratification, comorbidities and participant's personal goals.       Expected Outcomes Short Term: Attend rehab on a regular basis to increase amount of physical activity.;Long Term: Exercising regularly at least 3-5 days a week.;Long Term: Add in home exercise to make exercise part of routine and to increase amount of physical activity.       Increase Strength and Stamina Yes       Intervention Provide advice, education, support and counseling about physical activity/exercise needs.;Develop an individualized exercise prescription for aerobic and resistive training based on initial evaluation findings, risk stratification, comorbidities and participant's personal goals.       Expected Outcomes Short Term: Increase workloads from initial exercise prescription for resistance, speed, and METs.;Short Term: Perform resistance training exercises routinely during rehab and add in resistance training at home;Long Term: Improve cardiorespiratory fitness, muscular endurance and strength as measured by increased METs and functional capacity ( )       Able to understand and use rate of perceived exertion (RPE) scale Yes       Intervention Provide education and explanation on how to use RPE scale       Expected Outcomes Short Term: Able to use RPE daily in rehab to express subjective intensity level;Long Term:  Able to use RPE to guide intensity level when exercising independently       Able to understand and use Dyspnea scale  Yes       Intervention Provide education and explanation on how to use Dyspnea scale       Expected Outcomes Short Term: Able to use Dyspnea scale daily in rehab to express subjective sense of shortness of breath during exertion;Long Term: Able to use Dyspnea scale to guide intensity level when exercising independently       Knowledge and understanding of Target Heart Rate Range (THRR) Yes       Intervention Provide education and explanation of THRR including how the numbers were predicted and where they are located for reference       Expected Outcomes Short Term: Able to state/look up THRR;Long Term: Able to use THRR to govern intensity when exercising independently;Short Term: Able to use daily as guideline for intensity in rehab       Understanding of Exercise Prescription Yes       Intervention Provide education, explanation, and written materials on patient's individual exercise prescription       Expected Outcomes Short Term: Able to explain program exercise prescription;Long Term: Able to explain home exercise prescription to exercise independently                Exercise Goals Re-Evaluation :  Exercise Goals Re-Evaluation  Row Name 08/14/23 1623 09/04/23 1630 09/06/23 0834 10/09/23 1622 11/15/23 1626     Exercise Goal Re-Evaluation   Exercise Goals Review Increase Physical Activity;Understanding of Exercise Prescription;Increase Strength and Stamina;Knowledge and understanding of Target Heart Rate Range (THRR);Able to understand and use rate of perceived exertion (RPE) scale Increase Physical Activity;Understanding of Exercise Prescription;Increase Strength and Stamina;Knowledge and understanding of Target Heart Rate Range (THRR);Able to understand and use rate of perceived exertion (RPE) scale -- Increase Physical Activity;Understanding of Exercise Prescription;Increase Strength and Stamina;Knowledge and understanding of Target Heart Rate Range (THRR);Able to understand and use  rate of perceived exertion (RPE) scale Increase Physical Activity;Understanding of Exercise Prescription;Increase Strength and Stamina;Knowledge and understanding of Target Heart Rate Range (THRR);Able to understand and use rate of perceived exertion (RPE) scale   Comments Reviewed MET's and goals. Pt tolerated exercise well with an average MET level of 2.1. Pt is feeling gopod about his goals and is already increasing stamina. He is continuing to work on his balance and when he feels more confident will add in golfing and home [employer]. Until then encouraged balance training in the [employer] book. Pt is also having issues with sleep, talked over resources to help with this. Reviewed MET's and goals. Pt tolerated exercise well with an average MET level of 2.7. Pt is progressing MET's and increasing strength and stamina. He says he is feeling more fit and active. He is not yet ready to retunr to golf, but is trying to build strength to feel confident when he is ready. -- Reviewed MET's and goals. Pt tolerated exercise well with an average MET level of 2.65. Pt has been out due to illness, but it doing very well since returning. He still feels increased fatigue and some mild brain fog. He states he is keeping up with his PCP about ongoing symptoms. But as far as exercise he is back at his previous [facility] and is doing nicely. Re-evaluated goals are to increase strength and stamina gradually. Pt graduated the [employer]. Pt tolerated exercise well with an average MET level of 3.25. He did very well in the program and increased his by 154 ft for a total of 1549ft. He is also gaining more enduance and balance. He plans to exercise on his own by walking, golf and going to his community fitness center for 30-60 mins 4-5 days. Also gave some info for [employer] in case he was interested   Expected Outcomes Will continue to monitor pt and progress workloads as tolerated without sign or symptom Will continue to monitor  pt and progress workloads as tolerated without sign or symptom -- Will continue to monitor pt and progress workloads as tolerated without sign or symptom Will continue to monitor pt and progress workloads as tolerated without sign or symptom    Row Name 12/27/23 0733 01/22/24 1144           Exercise Goal Re-Evaluation   Exercise Goals Review Increase Physical Activity;Able to understand and use Dyspnea scale;Understanding of Exercise Prescription;Increase Strength and Stamina;Knowledge and understanding of Target Heart Rate Range (THRR);Able to understand and use rate of perceived exertion (RPE) scale Increase Physical Activity;Able to understand and use Dyspnea scale;Understanding of Exercise Prescription;Increase Strength and Stamina;Knowledge and understanding of Target Heart Rate Range (THRR);Able to understand and use rate of perceived exertion (RPE) scale      Comments Pt has not began PR yet. Will progress as tolerated. Pt has completed 7 exercise sessions. He is exercising on the recumbent  stepper for 15 min, level 3, METs 2.8. He then is exercising on the recumbent bike for 15 min, level 3, METs 4. He is tolerating well and progressing slowly. Performing warm up and cool down without limitations, including squats.      Expected Outcomes Through exercise in rehab and at home, the patient will decrease shortness of breath with daily activities and feel confident in doing an exercise regimen at home. Through exercise in rehab and at home, the patient will decrease shortness of breath with daily activities and feel confident in doing an exercise regimen at home.               Discharge Exercise Prescription (Final Exercise Prescription Changes):  Exercise Prescription Changes - 01/28/24 1400       Response to Exercise   Blood Pressure (Admit) 117/66    Blood Pressure (Exercise) 116/74    Blood Pressure (Exit) 120/66    Heart Rate (Admit) 56 bpm    Heart Rate (Exercise) 85 bpm     Heart Rate (Exit) 57 bpm    Oxygen Saturation (Admit) 99 %    Oxygen Saturation (Exercise) 96 %    Oxygen Saturation (Exit) 97 %    Rating of Perceived Exertion (Exercise) 12    Perceived Dyspnea (Exercise) 1    Duration Continue with 30 min of aerobic exercise without signs/symptoms of physical distress.    Intensity THRR unchanged      Progression   Progression Continue to progress workloads to maintain intensity without signs/symptoms of physical distress.      Resistance Training   Training Prescription Yes    Weight blue bands    Reps 10-15    Time 10 Minutes      Recumbant Bike   Level 3    Minutes 15    METs 3.3      NuStep   Level 3    Minutes 15    METs 2.4             Nutrition:  Target Goals: Understanding of nutrition guidelines, daily intake of sodium 1500mg , cholesterol 200mg , calories 30% from fat and 7% or less from saturated fats, daily to have 5 or more servings of fruits and vegetables.  Biometrics:  Pre Biometrics - 12/20/23 1304       Pre Biometrics   Grip Strength 30 kg             Post Biometrics - 11/08/23 1652        Post  Biometrics   Height 5\' 10"  (1.778 m)    Weight 79.4 kg    Waist Circumference 43 inches    Hip Circumference 37 inches    Waist to Hip Ratio 1.16 %    BMI (Calculated) 25.12    Triceps Skinfold 8 mm    % Body Fat 25.9 %    Grip Strength 25 kg    Single Leg Stand 10 seconds             Nutrition Therapy Plan and Nutrition Goals:  Nutrition Therapy & Goals - 01/01/24 1002       Nutrition Therapy   Diet Heart Healthy Diet    Drug/Food Interactions Statins/Certain Fruits      Personal Nutrition Goals   Nutrition Goal Patient to identify strategies for reducing cardiovascular risk by attending the [employer] education and nutrition series weekly.    Personal Goal #2 Patient to improve diet quality by using the plate method as a  guide for meal planning to include lean protein/plant protein, fruits,  vegetables, whole grains, nonfat dairy as part of a well-balanced diet.    Personal Goal #3 Patient to limit sodium intake to 2300mg  per day    Comments Goals in progress. Danny [name] recently graduated from cardiac rehab in February 2025 s/p TAVR. Patient with medical history of s/p TAVR, hyperlipidemia, CAD, aortic stenosis, s/p coronary artery stent placement, and CHF. Danny [name]'s LDL remains well controlled. He has excellent nutrition knowledge following completion of intensive cardiac rehab and the [employer] education series. Patient will benefit from participation in pulmonary rehab for for nutrition, exercise, and lifestyle modification.      Intervention Plan   Intervention Prescribe, educate and counsel regarding individualized specific dietary modifications aiming towards targeted core components such as weight, hypertension, lipid management, diabetes, heart failure and other comorbidities.;Nutrition handout(s) given to patient.    Expected Outcomes Short Term Goal: Understand basic principles of dietary content, such as calories, fat, sodium, cholesterol and nutrients.;Long Term Goal: Adherence to prescribed nutrition plan.             Nutrition Assessments:  Nutrition Assessments - 12/31/23 1535       Rate Your Plate Scores   Pre Score 70            MEDIFICTS Score Key: >=70 Need to make dietary changes  40-70 Heart Healthy Diet <= 40 Therapeutic Level Cholesterol Diet  Flowsheet Row PULMONARY REHAB OTHER RESPIRATORY from 12/31/2023 in [facility] for Heart, Vascular, & Lung Health  Picture Your Plate Total Score on Admission 70      Picture Your Plate Scores: <64 Unhealthy dietary pattern with much room for improvement. 41-50 Dietary pattern unlikely to meet recommendations for good health and room for improvement. 51-60 More healthful dietary pattern, with some room for improvement.  >60 Healthy dietary pattern, although there may be some specific  behaviors that could be improved.    Nutrition Goals Re-Evaluation:  Nutrition Goals Re-Evaluation     Row Name 01/01/24 1002             Goals   Current Weight 174 lb 13.2 oz (79.3 kg)       Comment lipoproteinA WNL, lipids WNL, LDL 62       Expected Outcome Goals in progress. Danny [name] recently graduated from cardiac rehab in February 2025 s/p TAVR. Patient with medical history of s/p TAVR, hyperlipidemia, CAD, aortic stenosis, s/p coronary artery stent placement, and CHF. Danny [name]'s LDL remains well controlled. He has excellent nutrition knowledge following completion of intensive cardiac rehab and the [employer] education series. Patient will benefit from participation in pulmonary rehab for for nutrition, exercise, and lifestyle modification.                Nutrition Goals Discharge (Final Nutrition Goals Re-Evaluation):  Nutrition Goals Re-Evaluation - 01/01/24 1002       Goals   Current Weight 174 lb 13.2 oz (79.3 kg)    Comment lipoproteinA WNL, lipids WNL, LDL 62    Expected Outcome Goals in progress. Danny [name] recently graduated from cardiac rehab in February 2025 s/p TAVR. Patient with medical history of s/p TAVR, hyperlipidemia, CAD, aortic stenosis, s/p coronary artery stent placement, and CHF. Danny [name]'s LDL remains well controlled. He has excellent nutrition knowledge following completion of intensive cardiac rehab and the [employer] education series. Patient will benefit from participation in pulmonary rehab for for nutrition, exercise, and lifestyle modification.  Psychosocial: Target Goals: Acknowledge presence or absence of significant depression and/or stress, maximize coping skills, provide positive support system. Participant is able to verbalize types and ability to use techniques and skills needed for reducing stress and depression.  Initial Review & Psychosocial Screening:  Initial Psych Review & Screening - 12/20/23 1312       Initial Review   Current  issues with None Identified      Family Dynamics   Good Support System? Yes    Comments Spouse      Barriers   Psychosocial barriers to participate in program There are no identifiable barriers or psychosocial needs.      Screening Interventions   Interventions Encouraged to exercise             Quality of Life Scores:  Quality of Life - 11/13/23 1425       Quality of Life Scores   Health/Function Post 27.2 %    Socioeconomic Post 28.21 %    Psych/Spiritual Post 30 %    Family Post 28.5 %    GLOBAL Post 28.17 %            Scores of 19 and below usually indicate a poorer quality of life in these areas.  A difference of  2-3 points is a clinically meaningful difference.  A difference of 2-3 points in the total score of the Quality of Life Index has been associated with significant improvement in overall quality of life, self-image, physical symptoms, and general health in studies assessing change in quality of life.  PHQ-9: Review Flowsheet       12/20/2023 11/15/2023 07/23/2023 02/04/2020  Depression screen PHQ 2/9  Decreased Interest 0 0 0 0  Down, Depressed, Hopeless 0 0 0 0  PHQ - 2 Score 0 0 0 0  Altered sleeping 0 0 1 -  Tired, decreased energy 0 0 1 -  Change in appetite 0 1 0 -  Feeling bad or failure about yourself  0 0 0 -  Trouble concentrating 0 1 0 -  Moving slowly or fidgety/restless 0 0 0 -  Suicidal thoughts 0 0 0 -  PHQ-9 Score 0 2 2 -  Difficult doing work/chores Not difficult at all Not difficult at all Not difficult at all -   Interpretation of Total Score  Total Score Depression Severity:  1-4 = Minimal depression, 5-9 = Mild depression, 10-14 = Moderate depression, 15-19 = Moderately severe depression, 20-27 = Severe depression   Psychosocial Evaluation and Intervention:  Psychosocial Evaluation - 12/20/23 1313       Psychosocial Evaluation & Interventions   Interventions Encouraged to exercise with the program and follow exercise  prescription    Comments Danny [name] denies any psychosocial barriers at this time.    Expected Outcomes For Danny [name] to participate in PR free of psychosocial concerns.    Continue Psychosocial Services  No Follow up required             Psychosocial Re-Evaluation:  Psychosocial Re-Evaluation     Row Name 08/06/23 1637 08/23/23 0928 09/24/23 1204 10/18/23 1553 12/25/23 1514     Psychosocial Re-Evaluation   Current issues with None Identified None Identified None Identified None Identified None Identified   Comments Danny [name] did not voice any increased concerns or stressors during exercise at cardiac rehab Danny [name] continues not voice any increased concerns or stressors during exercise at cardiac rehab Danny [name] is currently absent as he has COVID 19. Danny [name] has returned to cardiac rehab  and will complete the program on 11/15/23. No changes since orientation. Danny [name] is scheduled to start the program on 4/15.   Expected Outcomes -- -- -- -- For Danny [name] to participate in PR free of psychosocial concerns.   Interventions Stress management education;Encouraged to attend Cardiac Rehabilitation for the exercise;Relaxation education Stress management education;Encouraged to attend Cardiac Rehabilitation for the exercise;Relaxation education Stress management education;Encouraged to attend Cardiac Rehabilitation for the exercise;Relaxation education Stress management education;Encouraged to attend Cardiac Rehabilitation for the exercise;Relaxation education Encouraged to attend Pulmonary Rehabilitation for the exercise   Continue Psychosocial Services  No Follow up required No Follow up required No Follow up required No Follow up required No Follow up required    Row Name 01/23/24 0918             Psychosocial Re-Evaluation   Current issues with None Identified       Comments Psychosocial monthly re-evaluation is as follows: Danny [name] denies any new psy/soc concerns. He declines any additional resources or referrals at  this time. We will continue to assess needs.       Expected Outcomes For Danny [name] to participate in PR free of psychosocial concerns.       Interventions Encouraged to attend Pulmonary Rehabilitation for the exercise       Continue Psychosocial Services  No Follow up required                Psychosocial Discharge (Final Psychosocial Re-Evaluation):  Psychosocial Re-Evaluation - 01/23/24 0918       Psychosocial Re-Evaluation   Current issues with None Identified    Comments Psychosocial monthly re-evaluation is as follows: Danny [name] denies any new psy/soc concerns. He declines any additional resources or referrals at this time. We will continue to assess needs.    Expected Outcomes For Danny [name] to participate in PR free of psychosocial concerns.    Interventions Encouraged to attend Pulmonary Rehabilitation for the exercise    Continue Psychosocial Services  No Follow up required             Education: Education Goals: Education classes will be provided on a weekly basis, covering required topics. Participant will state understanding/return demonstration of topics presented.  Learning Barriers/Preferences:   Education Topics: Know Your Numbers Group instruction that is supported by a PowerPoint presentation. Instructor discusses importance of knowing and understanding resting, exercise, and post-exercise oxygen saturation, heart rate, and blood pressure. Oxygen saturation, heart rate, blood pressure, rating of perceived exertion, and dyspnea are reviewed along with a normal range for these values.    Exercise for the Pulmonary Patient Group instruction that is supported by a PowerPoint presentation. Instructor discusses benefits of exercise, core components of exercise, frequency, duration, and intensity of an exercise routine, importance of utilizing pulse oximetry during exercise, safety while exercising, and options of places to exercise outside of rehab.    MET Level  Group  instruction provided by PowerPoint, verbal discussion, and written material to support subject matter. Instructor reviews what METs are and how to increase METs.    Pulmonary Medications Verbally interactive group education provided by instructor with focus on inhaled medications and proper administration.   Anatomy and Physiology of the Respiratory System Group instruction provided by PowerPoint, verbal discussion, and written material to support subject matter. Instructor reviews respiratory cycle and anatomical components of the respiratory system and their functions. Instructor also reviews differences in obstructive and restrictive respiratory diseases with examples of each.    Oxygen Safety Group instruction provided by PowerPoint,  verbal discussion, and written material to support subject matter. There is an overview of "What is Oxygen" and "Why do we need it".  Instructor also reviews how to create a safe environment for oxygen use, the importance of using oxygen as prescribed, and the risks of noncompliance. There is a brief discussion on traveling with oxygen and resources the patient may utilize.   Oxygen Use Group instruction provided by PowerPoint, verbal discussion, and written material to discuss how supplemental oxygen is prescribed and different types of oxygen supply systems. Resources for more information are provided.  Flowsheet Row PULMONARY REHAB OTHER RESPIRATORY from 01/02/2024 in [facility] for Heart, Vascular, & Lung Health  Date 01/02/24  Educator RT  Instruction Review Code 1- Verbalizes Understanding       Breathing Techniques Group instruction that is supported by demonstration and informational handouts. Instructor discusses the benefits of pursed lip and diaphragmatic breathing and detailed demonstration on how to perform both.  Flowsheet Row PULMONARY REHAB OTHER RESPIRATORY from 01/09/2024 in [facility] for  Heart, Vascular, & Lung Health  Date 01/09/24  Educator RN  Instruction Review Code 1- Verbalizes Understanding        Risk Factor Reduction Group instruction that is supported by a PowerPoint presentation. Instructor discusses the definition of a risk factor, different risk factors for pulmonary disease, and how the heart and lungs work together.   Pulmonary Diseases Group instruction provided by PowerPoint, verbal discussion, and written material to support subject matter. Instructor gives an overview of the different type of pulmonary diseases. There is also a discussion on risk factors and symptoms as well as ways to manage the diseases.   Stress and Energy Conservation Group instruction provided by PowerPoint, verbal discussion, and written material to support subject matter. Instructor gives an overview of stress and the impact it can have on the body. Instructor also reviews ways to reduce stress. There is also a discussion on energy conservation and ways to conserve energy throughout the day. Flowsheet Row PULMONARY REHAB OTHER RESPIRATORY from 01/16/2024 in [facility] for Heart, Vascular, & Lung Health  Date 01/16/24  Educator RN  Instruction Review Code 1- Verbalizes Understanding       Warning Signs and Symptoms Group instruction provided by PowerPoint, verbal discussion, and written material to support subject matter. Instructor reviews warning signs and symptoms of stroke, heart attack, cold and flu. Instructor also reviews ways to prevent the spread of infection. Flowsheet Row PULMONARY REHAB OTHER RESPIRATORY from 01/23/2024 in [facility] for Heart, Vascular, & Lung Health  Date 01/23/24  Educator RN  Instruction Review Code 1- Verbalizes Understanding       Other Education Group or individual verbal, written, or video instructions that support the educational goals of the pulmonary rehab program.    Knowledge  Questionnaire Score:  Knowledge Questionnaire Score - 12/20/23 1317       Knowledge Questionnaire Score   Pre Score 15/18             Core Components/Risk Factors/Patient Goals at Admission:  Personal Goals and Risk Factors at Admission - 12/20/23 1314       Core Components/Risk Factors/Patient Goals on Admission   Improve shortness of breath with ADL's Yes    Intervention Provide education, individualized exercise plan and daily activity instruction to help decrease symptoms of SOB with activities of daily living.    Expected Outcomes Short Term: Improve cardiorespiratory fitness to  achieve a reduction of symptoms when performing ADLs;Long Term: Be able to perform more ADLs without symptoms or delay the onset of symptoms             Core Components/Risk Factors/Patient Goals Review:   Goals and Risk Factor Review     Row Name 08/06/23 1638 08/23/23 0930 09/24/23 1206 10/18/23 1555 12/25/23 1515     Core Components/Risk Factors/Patient Goals Review   Personal Goals Review Weight Management/Obesity;Hypertension;Lipids Weight Management/Obesity;Hypertension;Lipids Weight Management/Obesity;Hypertension;Lipids Weight Management/Obesity;Hypertension;Lipids Develop more efficient breathing techniques such as purse lipped breathing and diaphragmatic breathing and practicing self-pacing with activity.;Improve shortness of breath with ADL's   Review Danny [name] started cardiac rehab on 07/29/23. Danny [name] is off to a good start to exercise. Vital signs have been  stable. Danny [name] is doing well with exercise at cardiac rehab. . Vital signs have been  stable. Danny [name] has lost 3.4 kg since starting cardiac rehab. Danny [name] is doing well with exercise at cardiac rehab. . Vital signs have been  stable. Danny [name] is currently absent as he has COVID 19. Danny [name] plans to return to exercise on 10/02/23. Danny [name] is doing well with exercise at cardiac rehab. . Vital signs have been  stable. Danny [name] will complete cardiac rehab on  11/15/23. Unable to assess goals yet. Danny [name] is scheduled to begin the program on 12/31/23   Expected Outcomes Danny [name] will continue to participate in cardiac rehab for exercise nutrition and lifestyle modifications. Danny [name] will continue to participate in cardiac rehab for exercise nutrition and lifestyle modifications. Danny [name] will continue to participate in cardiac rehab for exercise nutrition and lifestyle modifications. Danny [name] will continue to participate in cardiac rehab for exercise nutrition and lifestyle modifications. For Danny [name] to improve his shortness of breath with ADLs, developing more efficient breathing techniques such as purse lipped breathing and diaphragmatic breathing; and practicing self-pacing with activity    Row Name 01/23/24 0928             Core Components/Risk Factors/Patient Goals Review   Personal Goals Review Develop more efficient breathing techniques such as purse lipped breathing and diaphragmatic breathing and practicing self-pacing with activity.;Improve shortness of breath with ADL's       Review Monthly review of patient's Core Components/Risk Factors/Patient Goals are as follows: Goal progressing for improving shortness of breath with ADL's. Danny [name] is currently able to maintain sats >88% on RA while exercising on the NuStep and recumbent bike. Goal progressing for developing more efficient breathing techniques such as purse lipped breathing and diaphragmatic breathing; and practicing self-pacing with activity. Danny [name] is practicing his PLB. He knows how to slow down his pace or stop when dyspneic. He is practicing diaphragmatic breathing at home, before bed. We will continue to monitor Danny [name]'s progress throughout the program. Danny [name] will continue to benefit from PR for nutrition, education, exercise, and lifestyle modification.       Expected Outcomes For Danny [name] to improve his shortness of breath with ADLs, developing more efficient breathing techniques such as purse lipped  breathing and diaphragmatic breathing; and practicing self-pacing with activity                Core Components/Risk Factors/Patient Goals at Discharge (Final Review):   Goals and Risk Factor Review - 01/23/24 0928       Core Components/Risk Factors/Patient Goals Review   Personal Goals Review Develop more efficient breathing techniques such as purse lipped breathing and diaphragmatic breathing and practicing self-pacing with activity.;Improve shortness of breath with ADL's    Review Monthly review of  patient's Core Components/Risk Factors/Patient Goals are as follows: Goal progressing for improving shortness of breath with ADL's. Danny [name] is currently able to maintain sats >88% on RA while exercising on the NuStep and recumbent bike. Goal progressing for developing more efficient breathing techniques such as purse lipped breathing and diaphragmatic breathing; and practicing self-pacing with activity. Danny [name] is practicing his PLB. He knows how to slow down his pace or stop when dyspneic. He is practicing diaphragmatic breathing at home, before bed. We will continue to monitor Danny [name]'s progress throughout the program. Danny [name] will continue to benefit from PR for nutrition, education, exercise, and lifestyle modification.    Expected Outcomes For Danny [name] to improve his shortness of breath with ADLs, developing more efficient breathing techniques such as purse lipped breathing and diaphragmatic breathing; and practicing self-pacing with activity             ITP Comments:  ITP Comments     Row Name 08/06/23 1636 08/23/23 0926 09/24/23 1201 10/18/23 1553     ITP Comments 30 Day ITP Review. Danny [name] started cardiac rehab on 07/29/23. Danny [name] is off to a good start to exercise. 30 Day ITP Review. Danny [name] has good attendance and participation with  exercise at cardiac rehab. 30 Day ITP Review. Danny [name] has good attendance and participation with  exercise at cardiac rehab. Danny [name] is currently absent as he has COVID 19  30 Day ITP Review. Danny [name] has good attendance and participation with exercise at cardiac rehab  since his return post COVID 19 infection.             Comments: Pt is making expected progress toward Pulmonary Rehab goals after completing 9 session(s). Recommend continued exercise, life style modification, education, and utilization of breathing techniques to increase stamina and strength, while also decreasing shortness of breath with exertion.  Dr. [name] is Medical Director for Pulmonary Rehab at [facility].

## 2024-01-30 ENCOUNTER — Encounter (HOSPITAL_COMMUNITY)
Admission: RE | Admit: 2024-01-30 | Discharge: 2024-01-30 | Disposition: A | Discharge status: Home / Self Care | Source: Ambulatory Visit | Attending: Cardiology

## 2024-01-30 DIAGNOSIS — I5032 Chronic diastolic (congestive) heart failure: Secondary | ICD-10-CM

## 2024-01-30 DIAGNOSIS — Z952 Presence of prosthetic heart valve: Secondary | ICD-10-CM | POA: Diagnosis not present

## 2024-01-30 NOTE — Progress Notes (Signed)
 Daily Session Note  Patient Details  Name: Danny [name] MRN: [id number] Date of Birth: August 21, 1936 Referring Provider:   [name] Pulmonary Rehab Walk Test from 12/20/2023 in [facility] for Heart, Vascular, & Lung Health  Referring Provider [name]       Encounter Date: 01/30/2024  Check In:  Session Check In - 01/30/24 1500       Check-In   Supervising physician immediately available to respond to emergencies [facility] MD immediately available    Physician(s) [name], NP    Location [facility]    Staff Present [name] BS, ACSM-CEP, Exercise Physiologist;Samantha Belarus, RD, Toy Freund, RN, BSN;Johnny Alexia Angelucci, MS, Exercise Physiologist;Omran Keelin Felipe Horton, RT    Virtual Visit No    Medication changes reported     No    Fall or balance concerns reported    No    Tobacco Cessation No Change    Warm-up and Cool-down Performed as group-led instruction    Resistance Training Performed Yes    VAD Patient? No    PAD/SET Patient? No      Pain Assessment   Currently in Pain? No/denies    Multiple Pain Sites No             Capillary Blood Glucose: No results found for this or any previous visit (from the past 24 hours).    Social History   Tobacco Use  Smoking Status Never  Smokeless Tobacco Never    Goals Met:  Proper associated with RPD/PD & O2 Sat Independence with exercise equipment Exercise tolerated well No report of concerns or symptoms today Strength training completed today  Goals Unmet:  Not Applicable  Comments: Service time is from 1317 to 1440.    Dr. [name] is Medical Director for Pulmonary Rehab at [facility].

## 2024-02-04 ENCOUNTER — Encounter (HOSPITAL_COMMUNITY)
Admission: RE | Admit: 2024-02-04 | Discharge: 2024-02-04 | Disposition: A | Discharge status: Home / Self Care | Source: Ambulatory Visit | Attending: Cardiology | Admitting: Cardiology

## 2024-02-04 ENCOUNTER — Telehealth (HOSPITAL_COMMUNITY): Payer: Self-pay | Admitting: *Deleted

## 2024-02-04 DIAGNOSIS — I5032 Chronic diastolic (congestive) heart failure: Secondary | ICD-10-CM | POA: Diagnosis not present

## 2024-02-04 DIAGNOSIS — Z952 Presence of prosthetic heart valve: Secondary | ICD-10-CM

## 2024-02-04 NOTE — Progress Notes (Signed)
 Daily Session Note  Patient Details  Name: Danny [name] MRN: [id number] Date of Birth: [birth year] Referring Provider:   [name] Pulmonary Rehab Walk Test from 12/20/2023 in [facility] for Heart, Vascular, & Lung Health  Referring Provider [name]       Encounter Date: 02/04/2024  Check In:  Session Check In - 02/04/24 1352       Check-In   Supervising physician immediately available to respond to emergencies [facility] immediately available    Physician(s) [name], NP    [facility]    Staff Present [name] BS, ACSM-CEP, Exercise Physiologist;Mary Arlester Ladd, RN, BSN;Johnny Alexia Angelucci, MS, Exercise Physiologist;Blia Totman Darlean Edwards, MS, ACSM-CEP, Exercise Physiologist    Virtual Visit No    Medication changes reported     No    Fall or balance concerns reported    No    Tobacco Cessation No Change    Warm-up and Cool-down Performed as group-led instruction    Resistance Training Performed Yes    VAD Patient? No    PAD/SET Patient? No      Pain Assessment   Currently in Pain? No/denies    Pain Score 0-No pain    Multiple Pain Sites No             Capillary Blood Glucose: No results found for this or any previous visit (from the past 24 hours).    Social History   Tobacco Use  Smoking Status Never  Smokeless Tobacco Never    Goals Met:  Proper associated with RPD/PD & O2 Sat Independence with exercise equipment Exercise tolerated well No report of concerns or symptoms today Strength training completed today  Goals Unmet:  Not Applicable  Comments: Service time is from 1313 to 1440.    Dr. [name] is Medical Director for Pulmonary Rehab at [facility].

## 2024-02-04 NOTE — Telephone Encounter (Signed)
 Discussed with pts wife that PR elevator is nonfunctioning currently and he will need to take stairs if he chooses to come to class. She agreed and felt pt was able.  German Koller BS, ACSM-CEP 02/04/2024 11:06 AM

## 2024-02-06 ENCOUNTER — Encounter (HOSPITAL_COMMUNITY)
Admission: RE | Admit: 2024-02-06 | Discharge: 2024-02-06 | Disposition: A | Discharge status: Home / Self Care | Source: Ambulatory Visit | Attending: Cardiology | Admitting: Cardiology

## 2024-02-06 DIAGNOSIS — Z952 Presence of prosthetic heart valve: Secondary | ICD-10-CM | POA: Diagnosis not present

## 2024-02-06 DIAGNOSIS — I5032 Chronic diastolic (congestive) heart failure: Secondary | ICD-10-CM | POA: Diagnosis not present

## 2024-02-06 NOTE — Progress Notes (Signed)
 Daily Session Note  Patient Details  Name: Danny [name] MRN: [id number] Date of Birth: July 06, 1936 Referring Provider:   [name] Pulmonary Rehab Walk Test from 12/20/2023 in [facility] for Heart, Vascular, & Lung Health  Referring Provider [name]       Encounter Date: 02/06/2024  Check In:  Session Check In - 02/06/24 1324       Check-In   Supervising physician immediately available to respond to emergencies [facility] immediately available    Physician(s) Charles Connor, NP    [facility]    Staff Present [name] BS, ACSM-CEP, Exercise Physiologist;Cari Burgo Arlester Ladd, RN, BSN;Casey Darlean Edwards, MS, ACSM-CEP, Exercise Physiologist    Virtual Visit No    Medication changes reported     No    Fall or balance concerns reported    No    Tobacco Cessation No Change    Warm-up and Cool-down Performed as group-led instruction    Resistance Training Performed Yes    VAD Patient? No    PAD/SET Patient? No      Pain Assessment   Currently in Pain? No/denies    Multiple Pain Sites No             Capillary Blood Glucose: No results found for this or any previous visit (from the past 24 hours).    Social History   Tobacco Use  Smoking Status Never  Smokeless Tobacco Never    Goals Met:  Independence with exercise equipment Exercise tolerated well No report of concerns or symptoms today Strength training completed today  Goals Unmet:  Not Applicable  Comments: Service time is from 1315 to 1434    Dr. [name] is Medical Director for Pulmonary Rehab at [facility].

## 2024-02-11 ENCOUNTER — Encounter (HOSPITAL_COMMUNITY)
Admission: RE | Admit: 2024-02-11 | Discharge: 2024-02-11 | Disposition: A | Discharge status: Home / Self Care | Source: Ambulatory Visit | Attending: Cardiology | Admitting: Cardiology

## 2024-02-11 ENCOUNTER — Other Ambulatory Visit (HOSPITAL_COMMUNITY)

## 2024-02-11 VITALS — Wt 170.2 lb

## 2024-02-11 DIAGNOSIS — I5032 Chronic diastolic (congestive) heart failure: Secondary | ICD-10-CM | POA: Diagnosis not present

## 2024-02-11 DIAGNOSIS — Z952 Presence of prosthetic heart valve: Secondary | ICD-10-CM | POA: Diagnosis not present

## 2024-02-11 NOTE — Progress Notes (Signed)
 Daily Session Note  Patient Details  Name: Danny [name] MRN: [id number] Date of Birth: December 21, 1935 Referring Provider:   [name] Pulmonary Rehab Walk Test from 12/20/2023 in [facility] for Heart, Vascular, & Lung Health  Referring Provider [name]       Encounter Date: 02/11/2024  Check In:  Session Check In - 02/11/24 1318       Check-In   Supervising physician immediately available to respond to emergencies [facility] immediately available    Physician(s) Charles Connor, NP    [facility]    Staff Present [name] BS, ACSM-CEP, Exercise Physiologist;Josiel Gahm Arlester Ladd, RN, Shasta Deist, MS, ACSM-CEP, Exercise Physiologist;Johnny Alexia Angelucci, MS, Exercise Physiologist    Virtual Visit No    Medication changes reported     No    Fall or balance concerns reported    No    Tobacco Cessation No Change    Warm-up and Cool-down Performed as group-led instruction    Resistance Training Performed Yes    VAD Patient? No    PAD/SET Patient? No      Pain Assessment   Currently in Pain? No/denies    Pain Score 0-No pain    Multiple Pain Sites No             Capillary Blood Glucose: No results found for this or any previous visit (from the past 24 hours).   Exercise Prescription Changes - 02/11/24 1500       Response to Exercise   Blood Pressure (Admit) 134/76    Blood Pressure (Exercise) 120/80    Blood Pressure (Exit) 128/70    Heart Rate (Admit) 64 bpm    Heart Rate (Exercise) 95 bpm    Heart Rate (Exit) 70 bpm    Oxygen Saturation (Admit) 98 %    Oxygen Saturation (Exercise) 98 %    Oxygen Saturation (Exit) 98 %    Rating of Perceived Exertion (Exercise) 12    Perceived Dyspnea (Exercise) 1    Duration Continue with 30 min of aerobic exercise without signs/symptoms of physical distress.    Intensity THRR unchanged      Progression   Progression Continue to progress workloads to maintain intensity without  signs/symptoms of physical distress.      Resistance Training   Training Prescription Yes    Weight blue bands    Reps 10-15    Time 10 Minutes      Recumbant Bike   Level 3    RPM 86    Watts 53    Minutes 15    METs 3.6      NuStep   Level 3    Minutes 15    METs 3.6             Social History   Tobacco Use  Smoking Status Never  Smokeless Tobacco Never    Goals Met:  Independence with exercise equipment Exercise tolerated well No report of concerns or symptoms today Strength training completed today  Goals Unmet:  Not Applicable  Comments: Service time is from 1312 to 1440    Dr. [name] is Medical Director for Pulmonary Rehab at [facility].

## 2024-02-13 ENCOUNTER — Encounter (HOSPITAL_COMMUNITY)
Admission: RE | Admit: 2024-02-13 | Discharge: 2024-02-13 | Disposition: A | Discharge status: Home / Self Care | Source: Ambulatory Visit | Attending: Cardiology

## 2024-02-13 DIAGNOSIS — I5032 Chronic diastolic (congestive) heart failure: Secondary | ICD-10-CM | POA: Diagnosis not present

## 2024-02-13 DIAGNOSIS — Z952 Presence of prosthetic heart valve: Secondary | ICD-10-CM | POA: Diagnosis not present

## 2024-02-13 NOTE — Progress Notes (Signed)
 Daily Session Note  Patient Details  Name: Danny [name] MRN: [id number] Date of Birth: Jul 15, 1936 Referring Provider:   [name] Pulmonary Rehab Walk Test from 12/20/2023 in [facility] for Heart, Vascular, & Lung Health  Referring Provider [name]       Encounter Date: 02/13/2024  Check In:  Session Check In - 02/13/24 1422       Check-In   Supervising physician immediately available to respond to emergencies [facility] immediately available    Physician(s) Marlana Silvan, NP    [facility]    Staff Present [name] BS, ACSM-CEP, Exercise Physiologist;Mary Arlester Ladd, RN, Shasta Deist, MS, ACSM-CEP, Exercise Physiologist;Isadora Delorey Felipe Horton, RT    Virtual Visit No    Medication changes reported     No    Fall or balance concerns reported    No    Tobacco Cessation No Change    Warm-up and Cool-down Performed as group-led instruction    Resistance Training Performed Yes    VAD Patient? No    PAD/SET Patient? No      Pain Assessment   Currently in Pain? No/denies    Pain Score 0-No pain    Multiple Pain Sites No             Capillary Blood Glucose: No results found for this or any previous visit (from the past 24 hours).    Social History   Tobacco Use  Smoking Status Never  Smokeless Tobacco Never    Goals Met:  Proper associated with RPD/PD & O2 Sat Independence with exercise equipment Exercise tolerated well No report of concerns or symptoms today Strength training completed today  Goals Unmet:  Not Applicable  Comments: Service time is from 1316 to 1440.    Dr. [name] is Medical Director for Pulmonary Rehab at [facility]

## 2024-02-18 ENCOUNTER — Encounter (HOSPITAL_COMMUNITY)
Admission: RE | Admit: 2024-02-18 | Discharge: 2024-02-18 | Disposition: A | Discharge status: Home / Self Care | Source: Ambulatory Visit | Attending: Cardiology | Admitting: Cardiology

## 2024-02-18 DIAGNOSIS — I5032 Chronic diastolic (congestive) heart failure: Secondary | ICD-10-CM | POA: Diagnosis present

## 2024-02-18 NOTE — Progress Notes (Signed)
 Daily Session Note  Patient Details  Name: Danny [name] MRN: [id number] Date of Birth: April 11, 1936 Referring Provider:   [name] Pulmonary Rehab Walk Test from 12/20/2023 in [facility] for Heart, Vascular, & Lung Health  Referring Provider [name]       Encounter Date: 02/18/2024  Check In:  Session Check In - 02/18/24 1425       Check-In   Supervising physician immediately available to respond to emergencies [facility] immediately available    Physician(s) Koren Persons, NP    [facility]    Staff Present [name] BS, ACSM-CEP, Exercise Physiologist;[name], MS, ACSM-CEP, Exercise Physiologist;Vincenzo Stave Ena Harries, MS, Exercise Physiologist    Virtual Visit No    Medication changes reported     No    Fall or balance concerns reported    No    Tobacco Cessation No Change    Warm-up and Cool-down Performed as group-led instruction    Resistance Training Performed Yes    VAD Patient? No    PAD/SET Patient? No      Pain Assessment   Currently in Pain? No/denies    Pain Score 0-No pain    Multiple Pain Sites No             Capillary Blood Glucose: No results found for this or any previous visit (from the past 24 hours).    Social History   Tobacco Use  Smoking Status Never  Smokeless Tobacco Never    Goals Met:  Proper associated with RPD/PD & O2 Sat Independence with exercise equipment Exercise tolerated well No report of concerns or symptoms today Strength training completed today  Goals Unmet:  Not Applicable  Comments: Service time is from 1314 to 1440.    Dr. [name] is Medical Director for Pulmonary Rehab at [facility].

## 2024-02-20 ENCOUNTER — Encounter (HOSPITAL_COMMUNITY)
Admission: RE | Admit: 2024-02-20 | Discharge: 2024-02-20 | Disposition: A | Discharge status: Home / Self Care | Source: Ambulatory Visit | Attending: Cardiology

## 2024-02-20 DIAGNOSIS — I5032 Chronic diastolic (congestive) heart failure: Secondary | ICD-10-CM | POA: Diagnosis not present

## 2024-02-20 NOTE — Progress Notes (Signed)
 Daily Session Note  Patient Details  Name: Danny [name] MRN: [id number] Date of Birth: May 14, 1936 Referring Provider:   [name] Pulmonary Rehab Walk Test from 12/20/2023 in [facility] for Heart, Vascular, & Lung Health  Referring Provider [name]       Encounter Date: 02/20/2024  Check In:  Session Check In - 02/20/24 1411       Check-In   Supervising physician immediately available to respond to emergencies [facility] immediately available    Physician(s) [name], NP    [facility]    Staff Present [name] BS, ACSM-CEP, Exercise Physiologist;[name], MS, ACSM-CEP, Exercise Physiologist;[name], RD, [name], MS, ACSM-CEP, CCRP, Exercise Physiologist    Virtual Visit No    Medication changes reported     No    Fall or balance concerns reported    No    Tobacco Cessation No Change    Warm-up and Cool-down Performed as group-led instruction    Resistance Training Performed Yes    VAD Patient? No    PAD/SET Patient? No      Pain Assessment   Currently in Pain? No/denies    Multiple Pain Sites No             Capillary Blood Glucose: No results found for this or any previous visit (from the past 24 hours).    Social History   Tobacco Use  Smoking Status Never  Smokeless Tobacco Never    Goals Met:  Proper associated with RPD/PD & O2 Sat Independence with exercise equipment Exercise tolerated well No report of concerns or symptoms today Strength training completed today  Goals Unmet:  Not Applicable  Comments: Service time is from 1312 to 1440.    Dr. [name] is Medical Director for Pulmonary Rehab at [facility].

## 2024-02-25 ENCOUNTER — Encounter (HOSPITAL_COMMUNITY)
Admission: RE | Admit: 2024-02-25 | Discharge: 2024-02-25 | Disposition: A | Discharge status: Home / Self Care | Source: Ambulatory Visit | Attending: Cardiology

## 2024-02-25 VITALS — Wt 173.3 lb

## 2024-02-25 DIAGNOSIS — I5032 Chronic diastolic (congestive) heart failure: Secondary | ICD-10-CM | POA: Diagnosis not present

## 2024-02-25 NOTE — Progress Notes (Signed)
 Daily Session Note  Patient Details  Name: Danny [name] MRN: [id number] Date of Birth: September 08, 1936 Referring Provider:   [name] Pulmonary Rehab Walk Test from 12/20/2023 in [facility] for Heart, Vascular, & Lung Health  Referring Provider [name]       Encounter Date: 02/25/2024  Check In:  Session Check In - 02/25/24 1505       Check-In   Supervising physician immediately available to respond to emergencies [facility] MD immediately available    Physician(s) [name], NP    [facility]    Staff Present [name] BS, ACSM-CEP, Exercise Physiologist;Monchel Pollitt Nolon Baxter, MS, ACSM-CEP, Exercise Physiologist;Casey Vernadine Golas Belarus, RD, LDN;Johnny Alexia Angelucci, MS, Exercise Physiologist;Mary Bastin, RN, BSN    Virtual Visit No    Medication changes reported     No    Fall or balance concerns reported    No    Tobacco Cessation No Change    Warm-up and Cool-down Performed as group-led instruction    Resistance Training Performed Yes    VAD Patient? No    PAD/SET Patient? No      Pain Assessment   Currently in Pain? No/denies    Pain Score 0-No pain    Multiple Pain Sites No             Capillary Blood Glucose: No results found for this or any previous visit (from the past 24 hours).   Exercise Prescription Changes - 02/25/24 1500       Response to Exercise   Blood Pressure (Admit) 120/70    Blood Pressure (Exercise) 128/70    Blood Pressure (Exit) 102/66    Heart Rate (Admit) 63 bpm    Heart Rate (Exercise) 82 bpm    Heart Rate (Exit) 60 bpm    Oxygen Saturation (Admit) 97 %    Oxygen Saturation (Exercise) 98 %    Oxygen Saturation (Exit) 95 %    Rating of Perceived Exertion (Exercise) 14    Perceived Dyspnea (Exercise) 3    Duration Continue with 30 min of aerobic exercise without signs/symptoms of physical distress.    Intensity THRR unchanged      Progression   Progression Continue to progress  workloads to maintain intensity without signs/symptoms of physical distress.      Resistance Training   Training Prescription Yes    Weight blue bands    Reps 10-15    Time 10 Minutes      Recumbant Bike   Level 3    RPM 70    Watts 56    Minutes 15    METs 3.9      NuStep   Level 4    SPM 115    Minutes 15    METs 2.9             Social History   Tobacco Use  Smoking Status Never  Smokeless Tobacco Never    Goals Met:  Proper associated with RPD/PD & O2 Sat Independence with exercise equipment Exercise tolerated well No report of concerns or symptoms today Strength training completed today  Goals Unmet:  Not Applicable  Comments: Service time is from 1323 to 1446.    Dr. [name] is Medical Director for Pulmonary Rehab at [facility].

## 2024-02-26 NOTE — Progress Notes (Signed)
 Pulmonary Individual Treatment Plan  Patient Details  Name: Danny [name] MRN: [id number] Date of Birth: 1936-03-23 Referring Provider:   [name] Pulmonary Rehab Walk Test from 12/20/2023 in [facility]  Referring Provider [facility]       Initial Encounter Date:  Flowsheet Row Pulmonary Rehab Walk Test from 12/20/2023 in [facility]  Date 12/20/23       Visit Diagnosis: Heart failure, diastolic, chronic (HCC)  Patient's Home Medications on Admission:   Current Outpatient Medications:    amoxicillin  (AMOXIL ) 500 MG capsule, Take 4 capsules (2,000 mg total) by mouth once as needed for up to 1 dose. 1 HOUR PRIOR TO DENTAL CLEANINGS, Disp: 4 capsule, Rfl: 4   aspirin  EC 81 MG tablet, Take 81 mg by mouth in the morning., Disp: , Rfl:    betamethasone  dipropionate 0.05 % cream, Apply 0.5 application  topically 2 (two) times daily as needed (dry/irritated skin)., Disp: , Rfl:    Calcium -Magnesium  (CAL-MAG PO), Take 1 tablet by mouth in the morning. With Potassium 500 mg /500 mg /99 mg, Disp: , Rfl:    Cholecalciferol (VITAMIN D3) 250 MCG (10000 UT) capsule, Take 10,000 Units by mouth in the morning, at noon, and at bedtime., Disp: , Rfl:    clopidogrel  (PLAVIX ) 75 MG tablet, Take 1 tablet (75 mg total) by mouth daily with breakfast., Disp: 90 tablet, Rfl: 3   Coenzyme Q10 (CO Q 10 PO), Take 400 mg by mouth in the morning., Disp: , Rfl:    finasteride  (PROSCAR ) 5 MG tablet, Take 5 mg by mouth in the morning., Disp: , Rfl:    furosemide  (LASIX ) 20 MG tablet, Take 1 tablet (20 mg total) by mouth as needed (For weight gain of 3 lbs in 24 hours or 5 lbs in a week)., Disp: 30 tablet, Rfl: 3   gabapentin  (NEURONTIN ) 100 MG capsule, Take 1 capsule (100 mg total) by mouth daily as needed., Disp: 30 capsule, Rfl: 5   gabapentin  (NEURONTIN ) 600 MG tablet, TAKE 1 TABLET AT 4PM AND  TAKE 1 TABLET AT BEDTIME., Disp: 180 tablet, Rfl: 3   Magnesium  Oxide 400 MG CAPS, Take 1 capsule (400 mg total) by mouth daily., Disp: 30 capsule, Rfl: 11   metoprolol  succinate (TOPROL -XL) 25 MG 24 hr tablet, TAKE ONE TABLET BY MOUTH DAILY, Disp: 90 tablet, Rfl: 3   Multiple Vitamin (MULTIVITAMIN WITH MINERALS) TABS tablet, Take 1 tablet by mouth daily. Centrum Silver, Disp: , Rfl:    Multiple Vitamins-Minerals (PRESERVISION AREDS 2) CAPS, Take 1 capsule by mouth in the morning and at bedtime., Disp: , Rfl:    nitroGLYCERIN  (NITROSTAT ) 0.4 MG SL tablet, ONE TABLET UNDER TONGUE WHEN NEEDED FOR CHEST PAIN. MAY REPEAT IN 5 MINUTES., Disp: 25 tablet, Rfl: 6   pantoprazole  (PROTONIX ) 40 MG tablet, Take 1 tablet (40 mg total) by mouth daily., Disp: 90 tablet, Rfl: 3   Probiotic Product (PROBIOTIC-10 PO), Take 1 capsule by mouth in the morning., Disp: , Rfl:    Propylene Glycol (SYSTANE BALANCE OP), Apply 1 drop to eye as needed., Disp: , Rfl:    rosuvastatin  (CRESTOR ) 20 MG tablet, Take 1 tablet (20 mg total) by mouth daily., Disp: 90 tablet, Rfl: 3   Saw Palmetto 160 MG CAPS, Take 160 mg by mouth 2 (two) times daily., Disp: , Rfl:    traMADol  (ULTRAM ) 50 MG tablet, Take 50 mg  by mouth 3 (three) times daily as needed (back pain.)., Disp: , Rfl:    traZODone  (DESYREL ) 50 MG tablet, Take 25 mg by mouth at bedtime as needed for sleep., Disp: , Rfl:    Turmeric (QC TUMERIC COMPLEX) 500 MG CAPS, Take 500 mg by mouth in the morning., Disp: , Rfl:    zolpidem  (AMBIEN ) 10 MG tablet, Take 10 mg by mouth at bedtime., Disp: , Rfl:   Past Medical History: Past Medical History:  Diagnosis Date   Allergic rhinitis    Arthritis    BPH (benign prostatic hypertrophy)    Dr. Rozanne Corners   Cervical disc disease    Coronary artery disease    Patient developed exertional dyspnea. ETT-myoview  (8/12) with 11 METS, normal LV EF, normal perfusion at rest and stress but elevated TID ratio. Coronary CT angiogram in 9/12 showed  equivocal significant disease in the CFX. Cath in 10/12 showed EF 55-60%, 30% mid RCA, 40% distal RCA, 40% proximal LAD. ETT-Myoview  (10/14): Diaphragmatic attenuation, no ischemia, EF 62%, normal study   Detached vitreous humor    Diverticulosis    GERD (gastroesophageal reflux disease)    Hemorrhoids    Impaired fasting glucose    Ingrown toenail    Lumbar radiculopathy    Numbness and tingling in left arm    Onychomycosis    Rotator cuff disorder    S/P TAVR (transcatheter aortic valve replacement) 06/11/2023   s/p TAVR with a 26 mm Edwards S3UR via the TF approach by Dr. 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    Tobacco Use: Social History   Tobacco Use  Smoking Status Never  Smokeless Tobacco Never    Labs: Review Flowsheet  More data exists      Latest Ref Rng & Units 08/09/2020 08/15/2021 08/23/2022 05/02/2023 09/13/2023  Labs for ITP Cardiac and Pulmonary Rehab  Cholestrol 0 - 200 mg/dL 409  811  914  - 782   LDL (calc) 0 - 99 mg/dL 68  45  56  - 62   HDL-C >40 mg/dL 68  60  59  - 53   Trlycerides <150 mg/dL 55  43  73  - 956   Bicarbonate 20.0 - 28.0 mmol/L - - - 24.9  24.3  -  TCO2 22 - 32 mmol/L - - - 26  26  -  Acid-base deficit 0.0 - 2.0 mmol/L - - - 1.0  1.0  -  O2 Saturation % - - - 72  69  -    Details       Multiple values from one day are sorted in reverse-chronological order         Capillary Blood Glucose: No results found for: GLUCAP   Pulmonary Assessment Scores:  Pulmonary Assessment Scores     Row Name 12/20/23 1424         ADL UCSD   ADL Phase Entry     SOB Score total 32       CAT Score   CAT Score 12       mMRC Score   mMRC Score 2             UCSD: Self-administered rating of dyspnea associated with activities of daily living (ADLs) 6-point scale (0 = not at all to 5 = maximal or unable to do because of breathlessness)  Scoring Scores range from 0 to 120.  Minimally  important difference is 5 units  CAT: CAT  can identify the health impairment of COPD patients and is better correlated with disease progression.  CAT has a scoring range of zero to 40. The CAT score is classified into four groups of low (less than 10), medium (10 - 20), high (21-30) and very high (31-40) based on the impact level of disease on health status. A CAT score over 10 suggests significant symptoms.  A worsening CAT score could be explained by an exacerbation, poor medication adherence, poor inhaler technique, or progression of COPD or comorbid conditions.  CAT MCID is 2 points  mMRC: mMRC (Modified Medical Research Council) Dyspnea Scale is used to assess the degree of baseline functional disability in patients of respiratory disease due to dyspnea. No minimal important difference is established. A decrease in score of 1 point or greater is considered a positive change.   Pulmonary Function Assessment:  Pulmonary Function Assessment - 12/20/23 1320       Breath   Shortness of Breath Yes;Limiting activity             Exercise Target Goals: Exercise Program Goal: Individual exercise prescription set using results from initial 6 min walk test and THRR while considering  patient's activity barriers and safety.   Exercise Prescription Goal: Initial exercise prescription builds to 30-45 minutes a day of aerobic activity, 2-3 days per week.  Home exercise guidelines will be given to patient during program as part of exercise prescription that the participant will acknowledge.  Activity Barriers & Risk Stratification:  Activity Barriers & Cardiac Risk Stratification - 12/20/23 1315       Activity Barriers & Cardiac Risk Stratification   Activity Barriers Balance Concerns;Arthritis;Joint Problems;Back Problems;Deconditioning;Neck/Spine Problems             6 Minute Walk:  6 Minute Walk     Row Name 11/08/23 1651 12/20/23 1409       6 Minute Walk   Phase Discharge  Initial    Distance 1540 feet 1502 feet    Distance % Change 11.11 % --    Distance Feet Change 154 ft --    Walk Time 6 minutes 6 minutes    # of Rest Breaks 0 0    MPH 2.92 2.84    METS 2.61 2.45    RPE 11 11    Perceived Dyspnea  0 1    VO2 Peak 9.12 8.56    Symptoms No No    Resting HR 65 bpm 69 bpm    Resting BP 120/72 110/66    Resting Oxygen Saturation  -- 98 %    Exercise Oxygen Saturation  during 6 min walk -- 90 %    Max Ex. HR 91 bpm 87 bpm    Max Ex. BP 130/80 128/66    2 Minute Post BP 120/70 122/70      Interval HR   1 Minute HR -- 69    2 Minute HR -- 79    3 Minute HR -- 75    4 Minute HR -- 80    5 Minute HR -- 77    6 Minute HR -- 87    2 Minute Post HR -- 71    Interval Heart Rate? -- Yes      Interval Oxygen   Interval Oxygen? -- Yes    Baseline Oxygen Saturation % -- 98 %    1 Minute Oxygen Saturation % -- 93 %    1 Minute Liters of Oxygen -- 0 L  2 Minute Oxygen Saturation % -- 91 %    2 Minute Liters of Oxygen -- 0 L    3 Minute Oxygen Saturation % -- 95 %    3 Minute Liters of Oxygen -- 0 L    4 Minute Oxygen Saturation % -- 90 %    4 Minute Liters of Oxygen -- 0 L    5 Minute Oxygen Saturation % -- 97 %    5 Minute Liters of Oxygen -- 0 L    6 Minute Oxygen Saturation % -- 90 %    6 Minute Liters of Oxygen -- 0 L    2 Minute Post Oxygen Saturation % -- 93 %    2 Minute Post Liters of Oxygen -- 0 L             Oxygen Initial Assessment:  Oxygen Initial Assessment - 12/20/23 1316       Home Oxygen   Home Oxygen Device None    Sleep Oxygen Prescription None    Home Exercise Oxygen Prescription None    Home Resting Oxygen Prescription None      Initial 6 min Walk   Oxygen Used None      Program Oxygen Prescription   Program Oxygen Prescription None      Intervention   Short Term Goals To learn and understand importance of maintaining oxygen saturations>88%;To learn and demonstrate proper use of respiratory medications;To  learn and understand importance of monitoring SPO2 with pulse oximeter and demonstrate accurate use of the pulse oximeter.;To learn and demonstrate proper pursed lip breathing techniques or other breathing techniques.     Long  Term Goals Verbalizes importance of monitoring SPO2 with pulse oximeter and return demonstration;Maintenance of O2 saturations>88%;Exhibits proper breathing techniques, such as pursed lip breathing or other method taught during program session;Compliance with respiratory medication;Demonstrates proper use of MDI's             Oxygen Re-Evaluation:  Oxygen Re-Evaluation     Row Name 12/27/23 0732 01/22/24 1142 02/17/24 0754         Program Oxygen Prescription   Program Oxygen Prescription None None None       Home Oxygen   Home Oxygen Device None None None     Sleep Oxygen Prescription None None None     Home Exercise Oxygen Prescription None None None     Home Resting Oxygen Prescription None None None       Goals/Expected Outcomes   Short Term Goals To learn and understand importance of maintaining oxygen saturations>88%;To learn and demonstrate proper use of respiratory medications;To learn and understand importance of monitoring SPO2 with pulse oximeter and demonstrate accurate use of the pulse oximeter.;To learn and demonstrate proper pursed lip breathing techniques or other breathing techniques.  To learn and understand importance of maintaining oxygen saturations>88%;To learn and demonstrate proper use of respiratory medications;To learn and understand importance of monitoring SPO2 with pulse oximeter and demonstrate accurate use of the pulse oximeter.;To learn and demonstrate proper pursed lip breathing techniques or other breathing techniques.  To learn and understand importance of maintaining oxygen saturations>88%;To learn and demonstrate proper use of respiratory medications;To learn and understand importance of monitoring SPO2 with pulse oximeter and  demonstrate accurate use of the pulse oximeter.;To learn and demonstrate proper pursed lip breathing techniques or other breathing techniques.      Long  Term Goals Verbalizes importance of monitoring SPO2 with pulse oximeter and return demonstration;Maintenance of O2 saturations>88%;Exhibits proper breathing techniques,  such as pursed lip breathing or other method taught during program session;Compliance with respiratory medication;Demonstrates proper use of MDI's Verbalizes importance of monitoring SPO2 with pulse oximeter and return demonstration;Maintenance of O2 saturations>88%;Exhibits proper breathing techniques, such as pursed lip breathing or other method taught during program session;Compliance with respiratory medication;Demonstrates proper use of MDI's Verbalizes importance of monitoring SPO2 with pulse oximeter and return demonstration;Maintenance of O2 saturations>88%;Exhibits proper breathing techniques, such as pursed lip breathing or other method taught during program session;Compliance with respiratory medication;Demonstrates proper use of MDI's     Goals/Expected Outcomes Compliance and understanding of oxygen saturation monitoring and breath techniques to decrease shortness of breath. Compliance and understanding of oxygen saturation monitoring and breath techniques to decrease shortness of breath. Compliance and understanding of oxygen saturation monitoring and breath techniques to decrease shortness of breath.              Oxygen Discharge (Final Oxygen Re-Evaluation):  Oxygen Re-Evaluation - 02/17/24 0754       Program Oxygen Prescription   Program Oxygen Prescription None      Home Oxygen   Home Oxygen Device None    Sleep Oxygen Prescription None    Home Exercise Oxygen Prescription None    Home Resting Oxygen Prescription None      Goals/Expected Outcomes   Short Term Goals To learn and understand importance of maintaining oxygen saturations>88%;To learn and  demonstrate proper use of respiratory medications;To learn and understand importance of monitoring SPO2 with pulse oximeter and demonstrate accurate use of the pulse oximeter.;To learn and demonstrate proper pursed lip breathing techniques or other breathing techniques.     Long  Term Goals Verbalizes importance of monitoring SPO2 with pulse oximeter and return demonstration;Maintenance of O2 saturations>88%;Exhibits proper breathing techniques, such as pursed lip breathing or other method taught during program session;Compliance with respiratory medication;Demonstrates proper use of MDI's    Goals/Expected Outcomes Compliance and understanding of oxygen saturation monitoring and breath techniques to decrease shortness of breath.             Initial Exercise Prescription:  Initial Exercise Prescription - 12/20/23 1400       Date of Initial Exercise RX and Referring Provider   Date 12/20/23    Referring Provider [facility]    Expected Discharge Date 03/17/24      Recumbant Bike   Level 2    RPM 70    Watts 40    Minutes 15    METs 3.5      NuStep   Level 1    SPM 100    Minutes 15    METs 2.5      Intensity   THRR 40-80% of Max Heartrate 54-108    Ratings of Perceived Exertion 11-13    Perceived Dyspnea 0-4      Progression   Progression Continue progressive overload as per policy without signs/symptoms or physical distress.      Resistance Training   Training Prescription Yes    Weight blue bands    Reps 10-15             Perform Capillary Blood Glucose checks as needed.  Exercise Prescription Changes:   Exercise Prescription Changes     Row Name 09/06/23 0832 10/09/23 1620 10/23/23 1613 11/15/23 1622 12/31/23 1500     Response to Exercise   Blood Pressure (Admit) 134/80 120/64 120/70 118/70 142/84   Blood Pressure (Exercise) -- 128/72 -- -- 130/70   Blood Pressure (Exit) 124/80 104/72 108/72  136/80 118/68   Heart Rate (Admit) 69 bpm 54 bpm 55 bpm 63 bpm 61  bpm   Heart Rate (Exercise) 111 bpm 92 bpm 105 bpm 106 bpm 84 bpm   Heart Rate (Exit) 75 bpm 63 bpm 64 bpm 83 bpm 64 bpm   Oxygen Saturation (Admit) -- -- -- -- 97 %   Oxygen Saturation (Exercise) -- -- -- -- 98 %   Oxygen Saturation (Exit) -- -- -- -- 97 %   Rating of Perceived Exertion (Exercise) 12 12 12 12 12    Perceived Dyspnea (Exercise) 0 0 0 0 1   Symptoms none none none none --   Comments Reviewed MET's and goals Reviewed MET's and goals Reviewed MET's Pt graduated the pritik[employer] ICR --   Duration Progress to 30 minutes of  aerobic without signs/symptoms of physical distress Progress to 30 minutes of  aerobic without signs/symptoms of physical distress Progress to 30 minutes of  aerobic without signs/symptoms of physical distress Progress to 30 minutes of  aerobic without signs/symptoms of physical distress Progress to 30 minutes of  aerobic without signs/symptoms of physical distress   Intensity THRR unchanged THRR unchanged THRR unchanged THRR unchanged THRR unchanged     Progression   Progression Continue to progress workloads to maintain intensity without signs/symptoms of physical distress. Continue to progress workloads to maintain intensity without signs/symptoms of physical distress. Continue to progress workloads to maintain intensity without signs/symptoms of physical distress. Continue to progress workloads to maintain intensity without signs/symptoms of physical distress. Continue to progress workloads to maintain intensity without signs/symptoms of physical distress.   Average METs 2.7 2.65 2.9 3.25 --     Resistance Training   Training Prescription No No No Yes Yes   Weight 3 lbs 3 lbs 3 lbs 3 lbs blue bands   Reps 10-15 10-15 10-15 10-15 10-15   Time 10 Minutes 10 Minutes 10 Minutes 10 Minutes 10 Minutes     Recumbant Bike   Level 2 2 3 3 2    RPM 82 81 90 74 85   Watts 32 28 31 43 28   Minutes 15 15 15 15 15    METs 2.8 3 2.8 3.7 2.6     NuStep   Level 1 1 2 2 2     SPM 120 134 145 130 116   Minutes 15 15 15 15 15    METs 2.6 2.3 3 3  2.5     Home Exercise Plan   Plans to continue exercise at -- -- [employer] (comment) [employer] (comment) --   Frequency -- -- Add 2 additional days to program exercise sessions. Add 2 additional days to program exercise sessions. --   Initial Home Exercises Provided -- -- 10/23/23 10/23/23 --    Row Name 01/14/24 1500 01/28/24 1400 02/11/24 1500 02/25/24 1500       Response to Exercise   Blood Pressure (Admit) 112/66 117/66 134/76 120/70    Blood Pressure (Exercise) 116/66 116/74 120/80 128/70    Blood Pressure (Exit) 120/74 120/66 128/70 102/66    Heart Rate (Admit) 70 bpm 56 bpm 64 bpm 63 bpm    Heart Rate (Exercise) 87 bpm 85 bpm 95 bpm 82 bpm    Heart Rate (Exit) 67 bpm 57 bpm 70 bpm 60 bpm    Oxygen Saturation (Admit) 97 % 99 % 98 % 97 %    Oxygen Saturation (Exercise) 95 % 96 % 98 % 98 %    Oxygen Saturation (Exit) 94 %  97 % 98 % 95 %    Rating of Perceived Exertion (Exercise) 12 12 12 14     Perceived Dyspnea (Exercise) 1 1 1 3     Duration Continue with 30 min of aerobic exercise without signs/symptoms of physical distress. Continue with 30 min of aerobic exercise without signs/symptoms of physical distress. Continue with 30 min of aerobic exercise without signs/symptoms of physical distress. Continue with 30 min of aerobic exercise without signs/symptoms of physical distress.    Intensity THRR unchanged THRR unchanged THRR unchanged THRR unchanged      Progression   Progression Continue to progress workloads to maintain intensity without signs/symptoms of physical distress. Continue to progress workloads to maintain intensity without signs/symptoms of physical distress. Continue to progress workloads to maintain intensity without signs/symptoms of physical distress. Continue to progress workloads to maintain intensity without signs/symptoms of physical distress.      Resistance Training    Training Prescription Yes Yes Yes Yes    Weight blue bands blue bands blue bands blue bands    Reps 10-15 10-15 10-15 10-15    Time 10 Minutes 10 Minutes 10 Minutes 10 Minutes      Recumbant Bike   Level 2 3 3 3     RPM -- -- 86 70    Watts -- -- 53 56    Minutes 15 15 15 15     METs 2.7 3.3 3.6 3.9      NuStep   Level 2 3 3 4     SPM 123 -- -- 115    Minutes 15 15 15 15     METs 2.8 2.4 3.6 2.9             Exercise Comments:   Exercise Comments     Row Name 09/04/23 0836 09/04/23 1630 09/23/23 1048 10/07/23 1619 10/09/23 1625   Exercise Comments -- Reviewed MET's and goals. Pt tolerated exercise well with an average MET level of 2.7. Pt is progressing MET's and increasing strength and stamina. He says he is feeling more fit and active. He is not yet ready to retunr to golf, but is trying to build strength to feel confident when he is ready. Patient was absent for holiday, but now has COVID, will review education with patient when he is well and can return to cardiac rehab. Patient returned for his first day of exercise since illness, DEC WL for today to reacclimate. Will review education soon. Patient tolerated exercise well and felt good. MET AVG today 2.6. Reviewed MET's and goals. Pt tolerated exercise well with an average MET level of 2.65. Pt has been out due to illness, but it doing very well since returning. He still feels increased fatigue and some mild brain fog. He states he is keeping up with his PCP about ongoing symptoms. But as far as exercise he is back at his previous WL and is doing nicely. Re-evaluated goals are to increase strength and stamina gradually.    Row Name 10/23/23 1616 11/15/23 1630 12/31/23 1518       Exercise Comments Reviewed MET's and home exercise. Pt tolerated exercise well with an average MET level of 2.9. Pt has been doing well. Talked about gradual increase today and moved up WL. He is feeling good and is walking some on his own. He belongs to a  community gym so encouraged 1-2 days for 30-45 mins of walking or going to the community gym. Pt graduated the [employer]. Pt tolerated exercise well with an average  MET level of 3.25. He did very well in the program and increased his by 154 ft for a total of 1597ft. He is also gaining more enduance and balance. He plans to exercise on his own by walking, golf and going to his community fitness center for 30-60 mins 4-5 days. Also gave some info for [employer] in case he was interested Pt completed his first day of group exercise in Pulmonary Rehab. He exercised on the recumbent stepper for 15 min, level 2, METs 2.5. He then exercised on the recumbent bike for 15 min, level 2, METs 2.6. Tolerated well, performing warm up and cool down without significant limitations. Discussed METs, performing squats.              Exercise Goals and Review:   Exercise Goals     Row Name 12/20/23 1316             Exercise Goals   Increase Physical Activity Yes       Intervention Provide advice, education, support and counseling about physical activity/exercise needs.;Develop an individualized exercise prescription for aerobic and resistive training based on initial evaluation findings, risk stratification, comorbidities and participant's personal goals.       Expected Outcomes Short Term: Attend rehab on a regular basis to increase amount of physical activity.;Long Term: Exercising regularly at least 3-5 days a week.;Long Term: Add in home exercise to make exercise part of routine and to increase amount of physical activity.       Increase Strength and Stamina Yes       Intervention Provide advice, education, support and counseling about physical activity/exercise needs.;Develop an individualized exercise prescription for aerobic and resistive training based on initial evaluation findings, risk stratification, comorbidities and participant's personal goals.       Expected Outcomes Short Term:  Increase workloads from initial exercise prescription for resistance, speed, and METs.;Short Term: Perform resistance training exercises routinely during rehab and add in resistance training at home;Long Term: Improve cardiorespiratory fitness, muscular endurance and strength as measured by increased METs and functional capacity ( )       Able to understand and use rate of perceived exertion (RPE) scale Yes       Intervention Provide education and explanation on how to use RPE scale       Expected Outcomes Short Term: Able to use RPE daily in rehab to express subjective intensity level;Long Term:  Able to use RPE to guide intensity level when exercising independently       Able to understand and use Dyspnea scale Yes       Intervention Provide education and explanation on how to use Dyspnea scale       Expected Outcomes Short Term: Able to use Dyspnea scale daily in rehab to express subjective sense of shortness of breath during exertion;Long Term: Able to use Dyspnea scale to guide intensity level when exercising independently       Knowledge and understanding of Target Heart Rate Range (THRR) Yes       Intervention Provide education and explanation of THRR including how the numbers were predicted and where they are located for reference       Expected Outcomes Short Term: Able to state/look up THRR;Long Term: Able to use THRR to govern intensity when exercising independently;Short Term: Able to use daily as guideline for intensity in rehab       Understanding of Exercise Prescription Yes       Intervention Provide education, explanation, and written  materials on patient's individual exercise prescription       Expected Outcomes Short Term: Able to explain program exercise prescription;Long Term: Able to explain home exercise prescription to exercise independently                Exercise Goals Re-Evaluation :  Exercise Goals Re-Evaluation     Row Name 09/04/23 1630 09/06/23 0834 10/09/23  1622 11/15/23 1626 12/27/23 0733     Exercise Goal Re-Evaluation   Exercise Goals Review Increase Physical Activity;Understanding of Exercise Prescription;Increase Strength and Stamina;Knowledge and understanding of Target Heart Rate Range (THRR);Able to understand and use rate of perceived exertion (RPE) scale -- Increase Physical Activity;Understanding of Exercise Prescription;Increase Strength and Stamina;Knowledge and understanding of Target Heart Rate Range (THRR);Able to understand and use rate of perceived exertion (RPE) scale Increase Physical Activity;Understanding of Exercise Prescription;Increase Strength and Stamina;Knowledge and understanding of Target Heart Rate Range (THRR);Able to understand and use rate of perceived exertion (RPE) scale Increase Physical Activity;Able to understand and use Dyspnea scale;Understanding of Exercise Prescription;Increase Strength and Stamina;Knowledge and understanding of Target Heart Rate Range (THRR);Able to understand and use rate of perceived exertion (RPE) scale   Comments Reviewed MET's and goals. Pt tolerated exercise well with an average MET level of 2.7. Pt is progressing MET's and increasing strength and stamina. He says he is feeling more fit and active. He is not yet ready to retunr to golf, but is trying to build strength to feel confident when he is ready. -- Reviewed MET's and goals. Pt tolerated exercise well with an average MET level of 2.65. Pt has been out due to illness, but it doing very well since returning. He still feels increased fatigue and some mild brain fog. He states he is keeping up with his PCP about ongoing symptoms. But as far as exercise he is back at his previous [facility] and is doing nicely. Re-evaluated goals are to increase strength and stamina gradually. Pt graduated the [employer]. Pt tolerated exercise well with an average MET level of 3.25. He did very well in the program and increased his by 154 ft for a total of  1551ft. He is also gaining more enduance and balance. He plans to exercise on his own by walking, golf and going to his community fitness center for 30-60 mins 4-5 days. Also gave some info for [facility] in case he was interested Pt has not began PR yet. Will progress as tolerated.   Expected Outcomes Will continue to monitor pt and progress workloads as tolerated without sign or symptom -- Will continue to monitor pt and progress workloads as tolerated without sign or symptom Will continue to monitor pt and progress workloads as tolerated without sign or symptom Through exercise in rehab and at home, the patient will decrease shortness of breath with daily activities and feel confident in doing an exercise regimen at home.    Row Name 01/22/24 1144 02/17/24 0754           Exercise Goal Re-Evaluation   Exercise Goals Review Increase Physical Activity;Able to understand and use Dyspnea scale;Understanding of Exercise Prescription;Increase Strength and Stamina;Knowledge and understanding of Target Heart Rate Range (THRR);Able to understand and use rate of perceived exertion (RPE) scale Increase Physical Activity;Able to understand and use Dyspnea scale;Understanding of Exercise Prescription;Increase Strength and Stamina;Knowledge and understanding of Target Heart Rate Range (THRR);Able to understand and use rate of perceived exertion (RPE) scale      Comments Pt has completed 7 exercise  sessions. He is exercising on the recumbent stepper for 15 min, level 3, METs 2.8. He then is exercising on the recumbent bike for 15 min, level 3, METs 4. He is tolerating well and progressing slowly. Performing warm up and cool down without limitations, including squats. Pt has completed 14 exercise sessions. He is exercising on the recumbent stepper for 15 min, level 3, METs 3.8. He then is exercising on the recumbent bike for 15 min, level 3, METs 4.1. He will increase his level on both machines next session. He is  tolerating well and progressing. Using blue bands. Performing warm up and cool down without limitations, including squats.      Expected Outcomes Through exercise in rehab and at home, the patient will decrease shortness of breath with daily activities and feel confident in doing an exercise regimen at home. Through exercise in rehab and at home, the patient will decrease shortness of breath with daily activities and feel confident in doing an exercise regimen at home.               Discharge Exercise Prescription (Final Exercise Prescription Changes):  Exercise Prescription Changes - 02/25/24 1500       Response to Exercise   Blood Pressure (Admit) 120/70    Blood Pressure (Exercise) 128/70    Blood Pressure (Exit) 102/66    Heart Rate (Admit) 63 bpm    Heart Rate (Exercise) 82 bpm    Heart Rate (Exit) 60 bpm    Oxygen Saturation (Admit) 97 %    Oxygen Saturation (Exercise) 98 %    Oxygen Saturation (Exit) 95 %    Rating of Perceived Exertion (Exercise) 14    Perceived Dyspnea (Exercise) 3    Duration Continue with 30 min of aerobic exercise without signs/symptoms of physical distress.    Intensity THRR unchanged      Progression   Progression Continue to progress workloads to maintain intensity without signs/symptoms of physical distress.      Resistance Training   Training Prescription Yes    Weight blue bands    Reps 10-15    Time 10 Minutes      Recumbant Bike   Level 3    RPM 70    Watts 56    Minutes 15    METs 3.9      NuStep   Level 4    SPM 115    Minutes 15    METs 2.9             Nutrition:  Target Goals: Understanding of nutrition guidelines, daily intake of sodium 1500mg , cholesterol 200mg , calories 30% from fat and 7% or less from saturated fats, daily to have 5 or more servings of fruits and vegetables.  Biometrics:  Pre Biometrics - 12/20/23 1304       Pre Biometrics   Grip Strength 30 kg             Post Biometrics - 11/08/23  1652        Post  Biometrics   Height 5' 10 (1.778 m)    Weight 79.4 kg    Waist Circumference 43 inches    Hip Circumference 37 inches    Waist to Hip Ratio 1.16 %    BMI (Calculated) 25.12    Triceps Skinfold 8 mm    % Body Fat 25.9 %    Grip Strength 25 kg    Single Leg Stand 10 seconds  Nutrition Therapy Plan and Nutrition Goals:  Nutrition Therapy & Goals - 01/30/24 1037       Nutrition Therapy   Diet Heart Healthy Diet    Drug/Food Interactions Statins/Certain Fruits      Personal Nutrition Goals   Nutrition Goal Patient to limit sodium intake to 2300mg  per day   goal in progress.   Personal Goal #2 Patient to improve diet quality by using the plate method as a guide for meal planning to include lean protein/plant protein, fruits, vegetables, whole grains, nonfat dairy as part of a well-balanced diet.   goal in progress.   Comments Goals in progress. [name] recently graduated from cardiac rehab in February 2025 s/p TAVR. Patient with medical history of s/p TAVR, hyperlipidemia, CAD, aortic stenosis, s/p coronary artery stent placement, and CHF. Ronny's LDL remains well controlled. He has excellent nutrition knowledge (including sodium recommendations, fiber intake, saturated fat intake, etc) following completion of intensive cardiac rehab and the [facility] education series.He has maintained his weight since starting with our program. Patient will benefit from participation in pulmonary rehab for for nutrition, exercise, and lifestyle modification.      Intervention Plan   Intervention Prescribe, educate and counsel regarding individualized specific dietary modifications aiming towards targeted core components such as weight, hypertension, lipid management, diabetes, heart failure and other comorbidities.;Nutrition handout(s) given to patient.    Expected Outcomes Short Term Goal: Understand basic principles of dietary content, such as calories, fat, sodium,  cholesterol and nutrients.;Long Term Goal: Adherence to prescribed nutrition plan.             Nutrition Assessments:  Nutrition Assessments - 12/31/23 1535       Rate Your Plate Scores   Pre Score 70            MEDIFICTS Score Key: >=70 Need to make dietary changes  40-70 Heart Healthy Diet <= 40 Therapeutic Level Cholesterol Diet  Flowsheet Row PULMONARY REHAB OTHER RESPIRATORY from 12/31/2023 in [facility]  Picture Your Plate Total Score on Admission 70      Picture Your Plate Scores: <16 Unhealthy dietary pattern with much room for improvement. 41-50 Dietary pattern unlikely to meet recommendations for good health and room for improvement. 51-60 More healthful dietary pattern, with some room for improvement.  >60 Healthy dietary pattern, although there may be some specific behaviors that could be improved.    Nutrition Goals Re-Evaluation:  Nutrition Goals Re-Evaluation     Row Name 01/01/24 1002 01/30/24 1037           Goals   Current Weight 174 lb 13.2 oz (79.3 kg) 174 lb 6.1 oz (79.1 kg)      Comment lipoproteinA WNL, lipids WNL, LDL 62 no new labs; most recent labs lipoproteinA WNL, lipids WNL, LDL 62      Expected Outcome Goals in progress. [name] recently graduated from cardiac rehab in February 2025 s/p TAVR. Patient with medical history of s/p TAVR, hyperlipidemia, CAD, aortic stenosis, s/p coronary artery stent placement, and CHF. Ronny's LDL remains well controlled. He has excellent nutrition knowledge following completion of intensive cardiac rehab and the [facility] education series. Patient will benefit from participation in pulmonary rehab for for nutrition, exercise, and lifestyle modification. Goals in progress. [name] recently graduated from cardiac rehab in February 2025 s/p TAVR. Patient with medical history of s/p TAVR, hyperlipidemia, CAD, aortic stenosis, s/p coronary artery stent  placement, and CHF. Ronny's LDL remains well  controlled. He has excellent nutrition knowledge (including sodium recommendations, fiber intake, saturated fat intake, etc) following completion of intensive cardiac rehab and the [facility] education series.He has maintained his weight since starting with our program. Patient will benefit from participation in pulmonary rehab for for nutrition, exercise, and lifestyle modification.               Nutrition Goals Discharge (Final Nutrition Goals Re-Evaluation):  Nutrition Goals Re-Evaluation - 01/30/24 1037       Goals   Current Weight 174 lb 6.1 oz (79.1 kg)    Comment no new labs; most recent labs lipoproteinA WNL, lipids WNL, LDL 62    Expected Outcome Goals in progress. [name] recently graduated from cardiac rehab in February 2025 s/p TAVR. Patient with medical history of s/p TAVR, hyperlipidemia, CAD, aortic stenosis, s/p coronary artery stent placement, and CHF. Ronny's LDL remains well controlled. He has excellent nutrition knowledge (including sodium recommendations, fiber intake, saturated fat intake, etc) following completion of intensive cardiac rehab and the [facility] education series.He has maintained his weight since starting with our program. Patient will benefit from participation in pulmonary rehab for for nutrition, exercise, and lifestyle modification.             Psychosocial: Target Goals: Acknowledge presence or absence of significant depression and/or stress, maximize coping skills, provide positive support system. Participant is able to verbalize types and ability to use techniques and skills needed for reducing stress and depression.  Initial Review & Psychosocial Screening:  Initial Psych Review & Screening - 12/20/23 1312       Initial Review   Current issues with None Identified      Family Dynamics   Good Support System? Yes    Comments Spouse      Barriers   Psychosocial barriers to participate in program  There are no identifiable barriers or psychosocial needs.      Screening Interventions   Interventions Encouraged to exercise             Quality of Life Scores:  Quality of Life - 11/13/23 1425       Quality of Life Scores   Health/Function Post 27.2 %    Socioeconomic Post 28.21 %    Psych/Spiritual Post 30 %    Family Post 28.5 %    GLOBAL Post 28.17 %            Scores of 19 and below usually indicate a poorer quality of life in these areas.  A difference of  2-3 points is a clinically meaningful difference.  A difference of 2-3 points in the total score of the Quality of Life Index has been associated with significant improvement in overall quality of life, self-image, physical symptoms, and general health in studies assessing change in quality of life.  PHQ-9: Review Flowsheet       12/20/2023 11/15/2023 07/23/2023 02/04/2020  Depression screen PHQ 2/9  Decreased Interest 0 0 0 0  Down, Depressed, Hopeless 0 0 0 0  PHQ - 2 Score 0 0 0 0  Altered sleeping 0 0 1 -  Tired, decreased energy 0 0 1 -  Change in appetite 0 1 0 -  Feeling bad or failure about yourself  0 0 0 -  Trouble concentrating 0 1 0 -  Moving slowly or fidgety/restless 0 0 0 -  Suicidal thoughts 0 0 0 -  PHQ-9 Score 0 2 2 -  Difficult doing work/chores Not difficult at all Not difficult  at all Not difficult at all -   Interpretation of Total Score  Total Score Depression Severity:  1-4 = Minimal depression, 5-9 = Mild depression, 10-14 = Moderate depression, 15-19 = Moderately severe depression, 20-27 = Severe depression   Psychosocial Evaluation and Intervention:  Psychosocial Evaluation - 12/20/23 1313       Psychosocial Evaluation & Interventions   Interventions Encouraged to exercise with the program and follow exercise prescription    Comments [name] denies any psychosocial barriers at this time.    Expected Outcomes For Ronny to participate in PR free of psychosocial concerns.     Continue Psychosocial Services  No Follow up required             Psychosocial Re-Evaluation:  Psychosocial Re-Evaluation     Row Name 09/24/23 1204 10/18/23 1553 12/25/23 1514 01/23/24 0918 02/24/24 1411     Psychosocial Re-Evaluation   Current issues with None Identified None Identified None Identified None Identified None Identified   Comments [name] is currently absent as he has COVID 19. [name] has returned to cardiac rehab and will complete the program on 11/15/23. No changes since orientation. [name] is scheduled to start the program on 4/15. Psychosocial monthly re-evaluation is as follows: [name] denies any new psy/soc concerns. He declines any additional resources or referrals at this time. We will continue to assess needs. Psychosocial monthly re-evaluation is as follows: [name] denies any new psy/soc concerns. He declines any additional resources or referrals at this time. We will continue to assess needs.   Expected Outcomes -- -- For Ronny to participate in PR free of psychosocial concerns. For Ronny to participate in PR free of psychosocial concerns. For Ronny to participate in PR free of psychosocial concerns.   Interventions Stress management education;Encouraged to attend Cardiac Rehabilitation for the exercise;Relaxation education Stress management education;Encouraged to attend Cardiac Rehabilitation for the exercise;Relaxation education Encouraged to attend Pulmonary Rehabilitation for the exercise Encouraged to attend Pulmonary Rehabilitation for the exercise Encouraged to attend Pulmonary Rehabilitation for the exercise   Continue Psychosocial Services  No Follow up required No Follow up required No Follow up required No Follow up required No Follow up required            Psychosocial Discharge (Final Psychosocial Re-Evaluation):  Psychosocial Re-Evaluation - 02/24/24 1411       Psychosocial Re-Evaluation   Current issues with None Identified    Comments Psychosocial  monthly re-evaluation is as follows: [name] denies any new psy/soc concerns. He declines any additional resources or referrals at this time. We will continue to assess needs.    Expected Outcomes For Ronny to participate in PR free of psychosocial concerns.    Interventions Encouraged to attend Pulmonary Rehabilitation for the exercise    Continue Psychosocial Services  No Follow up required             Education: Education Goals: Education classes will be provided on a weekly basis, covering required topics. Participant will state understanding/return demonstration of topics presented.  Learning Barriers/Preferences:   Education Topics: Know Your Numbers Group instruction that is supported by a PowerPoint presentation. Instructor discusses importance of knowing and understanding resting, exercise, and post-exercise oxygen saturation, heart rate, and blood pressure. Oxygen saturation, heart rate, blood pressure, rating of perceived exertion, and dyspnea are reviewed along with a normal range for these values.    Exercise for the Pulmonary Patient Group instruction that is supported by a PowerPoint presentation. Instructor discusses benefits of exercise, core components of  exercise, frequency, duration, and intensity of an exercise routine, importance of utilizing pulse oximetry during exercise, safety while exercising, and options of places to exercise outside of rehab.    MET Level  Group instruction provided by PowerPoint, verbal discussion, and written material to support subject matter. Instructor reviews what METs are and how to increase METs.  Flowsheet Row PULMONARY REHAB OTHER RESPIRATORY from 02/06/2024 in [facility]  Date 02/06/24  Educator EP  Instruction Review Code 1- Verbalizes Understanding       Pulmonary Medications Verbally interactive group education provided by instructor with focus on inhaled  medications and proper administration.   Anatomy and Physiology of the Respiratory System Group instruction provided by PowerPoint, verbal discussion, and written material to support subject matter. Instructor reviews respiratory cycle and anatomical components of the respiratory system and their functions. Instructor also reviews differences in obstructive and restrictive respiratory diseases with examples of each.  Flowsheet Row PULMONARY REHAB OTHER RESPIRATORY from 02/20/2024 in [facility], [facility]  Date 02/20/24  Educator RT  Instruction Review Code 1- Verbalizes Understanding       Oxygen Safety Group instruction provided by PowerPoint, verbal discussion, and written material to support subject matter. There is an overview of "What is Oxygen" and "Why do we need it".  Instructor also reviews how to create a safe environment for oxygen use, the importance of using oxygen as prescribed, and the risks of noncompliance. There is a brief discussion on traveling with oxygen and resources the patient may utilize.   Oxygen Use Group instruction provided by PowerPoint, verbal discussion, and written material to discuss how supplemental oxygen is prescribed and different types of oxygen supply systems. Resources for more information are provided.  Flowsheet Row PULMONARY REHAB OTHER RESPIRATORY from 01/02/2024 in [facility] for Heart, Vascular, & Lung Health  Date 01/02/24  Educator RT  Instruction Review Code 1- Verbalizes Understanding       Breathing Techniques Group instruction that is supported by demonstration and informational handouts. Instructor discusses the benefits of pursed lip and diaphragmatic breathing and detailed demonstration on how to perform both.  Flowsheet Row PULMONARY REHAB OTHER RESPIRATORY from 01/09/2024 in [facility] for [facility], Vascular, [facility] Lung [facility]  Date 01/09/24   Educator RN  Instruction Review Code 1- Verbalizes Understanding        Risk Factor Reduction Group instruction that is supported by a PowerPoint presentation. Instructor discusses the definition of a risk factor, different risk factors for pulmonary disease, and how the heart and lungs work together. Flowsheet Row PULMONARY REHAB OTHER RESPIRATORY from 01/30/2024 in [facility], Vascular, & Lung Health  Date 01/30/24  Educator EP  Instruction Review Code 1- Verbalizes Understanding       Pulmonary Diseases Group instruction provided by PowerPoint, verbal discussion, and written material to support subject matter. Instructor gives an overview of the different type of pulmonary diseases. There is also a discussion on risk factors and symptoms as well as ways to manage the diseases. Flowsheet Row PULMONARY REHAB OTHER RESPIRATORY from 02/13/2024 in [facility] for Heart, Vascular, & Lung Health  Date 02/13/24  Educator RT  Instruction Review Code 1- Verbalizes Understanding       Stress and Energy Conservation Group instruction provided by PowerPoint, verbal discussion, and written material to support subject matter. Instructor gives  an overview of stress and the impact it can have on the body. Instructor also reviews ways to reduce stress. There is also a discussion on energy conservation and ways to conserve energy throughout the day. Flowsheet Row PULMONARY REHAB OTHER RESPIRATORY from 01/16/2024 in [facility] for Heart, Vascular, & Lung Health  Date 01/16/24  Educator RN  Instruction Review Code 1- Verbalizes Understanding       Warning Signs and Symptoms Group instruction provided by PowerPoint, verbal discussion, and written material to support subject matter. Instructor reviews warning signs and symptoms of stroke, heart attack, cold and flu. Instructor also reviews ways to prevent the spread of  infection. Flowsheet Row PULMONARY REHAB OTHER RESPIRATORY from 01/23/2024 in [facility] for Heart, Vascular, [facility] Lung Health  Date 01/23/24  Educator RN  Instruction Review Code 1- Verbalizes Understanding       Other Education Group or individual verbal, written, or video instructions that support the educational goals of the pulmonary rehab program.    Knowledge Questionnaire Score:  Knowledge Questionnaire Score - 12/20/23 1317       Knowledge Questionnaire Score   Pre Score 15/18             Core Components/Risk Factors/Patient Goals at Admission:  Personal Goals and Risk Factors at Admission - 12/20/23 1314       Core Components/Risk Factors/Patient Goals on Admission   Improve shortness of breath with ADL's Yes    Intervention Provide education, individualized exercise plan and daily activity instruction to help decrease symptoms of SOB with activities of daily living.    Expected Outcomes Short Term: Improve cardiorespiratory fitness to achieve a reduction of symptoms when performing ADLs;Long Term: Be able to perform more ADLs without symptoms or delay the onset of symptoms             Core Components/Risk Factors/Patient Goals Review:   Goals and Risk Factor Review     Row Name 09/24/23 1206 10/18/23 1555 12/25/23 1515 01/23/24 0928 02/24/24 1411     Core Components/Risk Factors/Patient Goals Review   Personal Goals Review Weight Management/Obesity;Hypertension;Lipids Weight Management/Obesity;Hypertension;Lipids Develop more efficient breathing techniques such as purse lipped breathing and diaphragmatic breathing and practicing self-pacing with activity.;Improve shortness of breath with ADL's Develop more efficient breathing techniques such as purse lipped breathing and diaphragmatic breathing and practicing self-pacing with activity.;Improve shortness of breath with ADL's Develop more efficient breathing techniques such as purse lipped  breathing and diaphragmatic breathing and practicing self-pacing with activity.;Improve shortness of breath with ADL's   Review [name] is doing well with exercise at cardiac rehab. . Vital signs have been  stable. [name] is currently absent as he has COVID 19. Ronny plans to return to exercise on 10/02/23. [name] is doing well with exercise at cardiac rehab. . Vital signs have been  stable. [name] will complete cardiac rehab on 11/15/23. Unable to assess goals yet. [name] is scheduled to begin the program on 12/31/23 Monthly review of patient's Core Components/Risk Factors/Patient Goals are as follows: Goal progressing for improving shortness of breath with ADL's. [name] is currently able to maintain sats >88% on RA while exercising on the NuStep and recumbent bike. Goal progressing for developing more efficient breathing techniques such as purse lipped breathing and diaphragmatic breathing; and practicing self-pacing with activity. [name] is practicing his PLB. He knows how to slow down his pace or stop when dyspneic. He is practicing diaphragmatic breathing at home, before bed. We will continue  to monitor Ronny's progress throughout the program. [name] will continue to benefit from PR for nutrition, education, exercise, and lifestyle modification. Monthly review of patient's Core Components/Risk Factors/Patient Goals are as follows: Goal progressing for improving shortness of breath with ADL's. [name] is currently able to maintain sats >88% on RA while exercising on the NuStep and recumbent bike. Goal met for developing more efficient breathing techniques such as purse lipped breathing and diaphragmatic breathing; and practicing self-pacing with activity. [name] is practicing his PLB and can initiate this technique independently. He knows how to slow down his pace or stop when dyspneic. He is practicing diaphragmatic breathing at home, before bed. We will continue to monitor Ronny's progress throughout the program. [name]  will continue to benefit from PR for nutrition, education, exercise, and lifestyle modification.   Expected Outcomes [name] will continue to participate in cardiac rehab for exercise nutrition and lifestyle modifications. [name] will continue to participate in cardiac rehab for exercise nutrition and lifestyle modifications. For Ronny to improve his shortness of breath with ADLs, developing more efficient breathing techniques such as purse lipped breathing and diaphragmatic breathing; and practicing self-pacing with activity For Ronny to improve his shortness of breath with ADLs, developing more efficient breathing techniques such as purse lipped breathing and diaphragmatic breathing; and practicing self-pacing with activity Pt will show progress toward meeting expected goals and outcomes.            Core Components/Risk Factors/Patient Goals at Discharge (Final Review):   Goals and Risk Factor Review - 02/24/24 1411       Core Components/Risk Factors/Patient Goals Review   Personal Goals Review Develop more efficient breathing techniques such as purse lipped breathing and diaphragmatic breathing and practicing self-pacing with activity.;Improve shortness of breath with ADL's    Review Monthly review of patient's Core Components/Risk Factors/Patient Goals are as follows: Goal progressing for improving shortness of breath with ADL's. [name] is currently able to maintain sats >88% on RA while exercising on the NuStep and recumbent bike. Goal met for developing more efficient breathing techniques such as purse lipped breathing and diaphragmatic breathing; and practicing self-pacing with activity. [name] is practicing his PLB and can initiate this technique independently. He knows how to slow down his pace or stop when dyspneic. He is practicing diaphragmatic breathing at home, before bed. We will continue to monitor Ronny's progress throughout the program. [name] will continue to benefit from PR for  nutrition, education, exercise, and lifestyle modification.    Expected Outcomes Pt will show progress toward meeting expected goals and outcomes.             ITP Comments:  ITP Comments     Row Name 09/24/23 1201 10/18/23 1553         ITP Comments 30 Day ITP Review. [name] has good attendance and participation with  exercise at cardiac rehab. [name] is currently absent as he has COVID 19 30 Day ITP Review. [name] has good attendance and participation with exercise at cardiac rehab  since his return post COVID 19 infection.               Comments: Pt is making expected progress toward Pulmonary Rehab goals after completing 17 session(s). Recommend continued exercise, life style modification, education, and utilization of breathing techniques to increase stamina and strength, while also decreasing shortness of breath with exertion.   Dr. [name] is Medical Director for Pulmonary Rehab at [facility].

## 2024-02-27 ENCOUNTER — Encounter (HOSPITAL_COMMUNITY)

## 2024-03-03 ENCOUNTER — Encounter (HOSPITAL_COMMUNITY)
Admission: RE | Admit: 2024-03-03 | Discharge: 2024-03-03 | Disposition: A | Discharge status: Home / Self Care | Source: Ambulatory Visit | Attending: Cardiology | Admitting: Cardiology

## 2024-03-03 DIAGNOSIS — I5032 Chronic diastolic (congestive) heart failure: Secondary | ICD-10-CM

## 2024-03-03 NOTE — Progress Notes (Signed)
 Pulmonary Rehab Incomplete Session Note  Patient Details  Name: Danny [name] MRN: [id number] Date of Birth: [birth year] Referring Provider:   [name] Pulmonary Rehab Walk Test from 12/20/2023 in [facility] for Heart, Vascular, & Lung Health  Referring Provider [name] did not complete his rehab session.  [name] stated during check in he felt dizzy and was in a fog. Pt reported that he ate breakfast around 7am, worked in the yard, and did not eat lunch/snack since this am. Informed pt that he could not exercise because he didn't eat and was dizzy. Provided graham crackers and ginger ale. Pt sat and ate snack, monitored pt for about 30 minutes from arrival. Pt feeling better, able to drive himself home.

## 2024-03-05 ENCOUNTER — Encounter (HOSPITAL_COMMUNITY)
Admission: RE | Admit: 2024-03-05 | Discharge: 2024-03-05 | Disposition: A | Discharge status: Home / Self Care | Source: Ambulatory Visit | Attending: Cardiology | Admitting: Cardiology

## 2024-03-05 DIAGNOSIS — I5032 Chronic diastolic (congestive) heart failure: Secondary | ICD-10-CM

## 2024-03-05 NOTE — Progress Notes (Signed)
 Home Exercise Prescription I have reviewed a Home Exercise Prescription with Danny [name]. He is currently walking 30 min and also working on the stepper 1-2 nonrehab days a week. Congratulated him on meeting his goals. Encourgaged him to increase intensity as able. The patient stated that their goals were to keep going. We reviewed exercise guidelines, target heart rate during exercise, RPE Scale, weather conditions, endpoints for exercise, warmup and cool down. The patient is encouraged to come to me with any questions. I will continue to follow up with the patient to assist them with progression and safety.  Spent 15 min discussing home exercise plan and goals.  Tiffnay Bossi [place], [place], ACSM-CEP 03/05/2024 3:24 PM

## 2024-03-05 NOTE — Progress Notes (Signed)
 Daily Session Note  Patient Details  Name: Danny [name] MRN: [id number] Date of Birth: Aug 15, 1936 Referring Provider:   [name] Pulmonary Rehab Walk Test from 12/20/2023 in [facility] for Heart, Vascular, & Lung Health  Referring Provider [name]    Encounter Date: 03/05/2024  Check In:  Session Check In - 03/05/24 1323       Check-In   Supervising physician immediately available to respond to emergencies [facility] MD immediately available    Physician(s) Lawana Pray, NP    Location [facility]    Staff Present [name] BS, ACSM-CEP, Exercise Physiologist;[name], MS, ACSM-CEP, Exercise Physiologist;Xander Jutras Carmen Chol, RN, BSN    Virtual Visit No    Medication changes reported     No    Fall or balance concerns reported    No    Tobacco Cessation No Change    Warm-up and Cool-down Performed as group-led instruction    Resistance Training Performed Yes    VAD Patient? No    PAD/SET Patient? No      Pain Assessment   Currently in Pain? No/denies    Pain Score 0-No pain    Multiple Pain Sites No          Capillary Blood Glucose: No results found for this or any previous visit (from the past 24 hours).    Social History   Tobacco Use  Smoking Status Never  Smokeless Tobacco Never    Goals Met:  Proper associated with RPD/PD & O2 Sat Independence with exercise equipment Exercise tolerated well No report of concerns or symptoms today Strength training completed today  Goals Unmet:  Not Applicable  Comments: Service time is from 1303 to 1443.    Dr. [name] is Medical Director for Pulmonary Rehab at [facility].

## 2024-03-10 ENCOUNTER — Encounter (HOSPITAL_COMMUNITY)
Admission: RE | Admit: 2024-03-10 | Discharge: 2024-03-10 | Disposition: A | Discharge status: Home / Self Care | Source: Ambulatory Visit | Attending: Cardiology | Admitting: Cardiology

## 2024-03-10 VITALS — Wt 170.6 lb

## 2024-03-10 DIAGNOSIS — I5032 Chronic diastolic (congestive) heart failure: Secondary | ICD-10-CM

## 2024-03-10 NOTE — Progress Notes (Signed)
 Daily Session Note  Patient Details  Name: Danny [name] MRN: [id number] Date of Birth: Jul 18, 1936 Referring Provider:   Conrad Ports Pulmonary Rehab Walk Test from 12/20/2023 in [facility] for Heart, Vascular, & Lung Health  Referring Provider Rolan    Encounter Date: 03/10/2024  Check In:  Session Check In - 03/10/24 1323       Check-In   Supervising physician immediately available to respond to emergencies [facility] MD immediately available    Physician(s) Josefa Beauvais, NP    Location [facility]    Staff Present Cloyd Aris BS, ACSM-CEP, Exercise Physiologist;Clevie Prout Nicholaus, MS, ACSM-CEP, Exercise Physiologist;Casey Claudene Candia Levin, RN, BSN    Virtual Visit No    Medication changes reported     No    Fall or balance concerns reported    No    Tobacco Cessation No Change    Warm-up and Cool-down Performed as group-led instruction    Resistance Training Performed Yes    VAD Patient? No    PAD/SET Patient? No      Pain Assessment   Currently in Pain? No/denies    Multiple Pain Sites No          Capillary Blood Glucose: No results found for this or any previous visit (from the past 24 hours).   Exercise Prescription Changes - 03/10/24 1300       Response to Exercise   Blood Pressure (Admit) 130/68    Blood Pressure (Exercise) 130/72    Blood Pressure (Exit) 98/62    Heart Rate (Admit) 67 bpm    Heart Rate (Exercise) 95 bpm    Heart Rate (Exit) 63 bpm    Oxygen Saturation (Admit) 98 %    Oxygen Saturation (Exercise) 95 %    Oxygen Saturation (Exit) 94 %    Rating of Perceived Exertion (Exercise) 12    Perceived Dyspnea (Exercise) 1    Duration Continue with 30 min of aerobic exercise without signs/symptoms of physical distress.    Intensity THRR unchanged      Progression   Progression Continue to progress workloads to maintain intensity without signs/symptoms of physical distress.      Resistance Training    Training Prescription Yes    Weight blue bands    Reps 10-15    Time 10 Minutes      Recumbant Bike   Level 3    RPM 76    Watts 52    Minutes 15    METs 4      NuStep   Level 4    SPM 127    Minutes 15    METs 3.2          Social History   Tobacco Use  Smoking Status Never  Smokeless Tobacco Never    Goals Met:  Proper associated with RPD/PD & O2 Sat Independence with exercise equipment Exercise tolerated well No report of concerns or symptoms today Strength training completed today  Goals Unmet:  Not Applicable  Comments: Service time is from 1308 to 1427.    Dr. Slater Staff is Medical Director for Pulmonary Rehab at [facility].

## 2024-03-12 ENCOUNTER — Encounter (HOSPITAL_COMMUNITY)
Admission: RE | Admit: 2024-03-12 | Discharge: 2024-03-12 | Disposition: A | Discharge status: Home / Self Care | Source: Ambulatory Visit | Attending: Cardiology | Admitting: Cardiology

## 2024-03-12 DIAGNOSIS — I5032 Chronic diastolic (congestive) heart failure: Secondary | ICD-10-CM | POA: Diagnosis not present

## 2024-03-12 NOTE — Progress Notes (Signed)
 Daily Session Note  Patient Details  Name: Danny [name] MRN: [id number] Date of Birth: [birth year] Referring Provider:   Conrad Ports Pulmonary Rehab Walk Test from 12/20/2023 in [facility] for Heart, Vascular, & Lung Health  Referring Provider Rolan    Encounter Date: 03/12/2024  Check In:  Session Check In - 03/12/24 1315       Check-In   Physician(s) Carolyn Mose, NP    Location [facility]    Staff Present Cloyd Aris BS, ACSM-CEP, Exercise Physiologist;Kaylee Nicholaus, MS, ACSM-CEP, Exercise Physiologist;Casey Claudene Candia Levin, RN, BSN    Virtual Visit No    Medication changes reported     No    Fall or balance concerns reported    No    Tobacco Cessation No Change    Warm-up and Cool-down Performed as group-led instruction    Resistance Training Performed Yes    VAD Patient? No    PAD/SET Patient? No      Pain Assessment   Currently in Pain? No/denies    Pain Score 0-No pain    Multiple Pain Sites No          Capillary Blood Glucose: No results found for this or any previous visit (from the past 24 hours).    Social History   Tobacco Use  Smoking Status Never  Smokeless Tobacco Never    Goals Met:  Independence with exercise equipment Exercise tolerated well No report of concerns or symptoms today Strength training completed today  Goals Unmet:  Not Applicable  Comments: Service time is from 1255 to 1432    Dr. Slater Staff is Medical Director for Pulmonary Rehab at [facility].

## 2024-03-16 ENCOUNTER — Telehealth (HOSPITAL_COMMUNITY): Payer: Self-pay | Admitting: Cardiology

## 2024-03-16 DIAGNOSIS — I5032 Chronic diastolic (congestive) heart failure: Secondary | ICD-10-CM

## 2024-03-16 NOTE — Telephone Encounter (Signed)
 Wife/patient called with concerns regarding swelling.  Reports he was told to take 40 mg x 3 days at some point when swelling was an issue and patient has repeated every week for months.   Reports he will take 40 mg MTuW and additional 20 mg prn the rest of the week  -reports they monitor LE edema very closely and this is what they use to adjust diuretics -reports SOB only occurs after yard work or exertion -weights stable however notices few pound difference when legs are swollen  Would like to know if its ok to keep doing this

## 2024-03-17 ENCOUNTER — Encounter (HOSPITAL_COMMUNITY)
Admission: RE | Admit: 2024-03-17 | Discharge: 2024-03-17 | Disposition: A | Discharge status: Home / Self Care | Source: Ambulatory Visit | Attending: Cardiology | Admitting: Cardiology

## 2024-03-17 DIAGNOSIS — I5032 Chronic diastolic (congestive) heart failure: Secondary | ICD-10-CM | POA: Diagnosis not present

## 2024-03-17 MED ORDER — FUROSEMIDE 20 MG PO TABS: 40.0000 mg | ORAL_TABLET | Freq: Every day | ORAL | 3 refills | Status: DC

## 2024-03-17 NOTE — Telephone Encounter (Signed)
Pt aware via wife 

## 2024-03-17 NOTE — Progress Notes (Signed)
 Discharge Progress Report  Patient Details  Name: Danny [name] MRN: [id number] Date of Birth: 04/06/1936 Referring Provider:   Conrad Ports Pulmonary Rehab Walk Test from 12/20/2023 in [facility]  Referring Provider [facility]     Number of Visits: 22  Reason for Discharge:  Patient has met program and personal goals.  Smoking History:  Social History   Tobacco Use  Smoking Status Never  Smokeless Tobacco Never    Diagnosis:  Heart failure, diastolic, chronic (HCC)  ADL UCSD:  Pulmonary Assessment Scores     Row Name 12/20/23 1424 03/12/24 1433       ADL UCSD   ADL Phase Entry Exit    SOB Score total 32 10      CAT Score   CAT Score 12 2      mMRC Score   mMRC Score 2 0       Initial Exercise Prescription:  Initial Exercise Prescription - 12/20/23 1400       Date of Initial Exercise RX and Referring Provider   Date 12/20/23    Referring Provider [facility]    Expected Discharge Date 03/17/24      Recumbant Bike   Level 2    RPM 70    Watts 40    Minutes 15    METs 3.5      NuStep   Level 1    SPM 100    Minutes 15    METs 2.5      Intensity   THRR 40-80% of Max Heartrate 54-108    Ratings of Perceived Exertion 11-13    Perceived Dyspnea 0-4      Progression   Progression Continue progressive overload as per policy without signs/symptoms or physical distress.      Resistance Training   Training Prescription Yes    Weight blue bands    Reps 10-15          Discharge Exercise Prescription (Final Exercise Prescription Changes):  Exercise Prescription Changes - 03/10/24 1300       Response to Exercise   Blood Pressure (Admit) 130/68    Blood Pressure (Exercise) 130/72    Blood Pressure (Exit) 98/62    Heart Rate (Admit) 67 bpm    Heart Rate (Exercise) 95 bpm    Heart Rate (Exit) 63 bpm    Oxygen Saturation (Admit) 98 %    Oxygen Saturation (Exercise) 95 %    Oxygen  Saturation (Exit) 94 %    Rating of Perceived Exertion (Exercise) 12    Perceived Dyspnea (Exercise) 1    Duration Continue with 30 min of aerobic exercise without signs/symptoms of physical distress.    Intensity THRR unchanged      Progression   Progression Continue to progress workloads to maintain intensity without signs/symptoms of physical distress.      Resistance Training   Training Prescription Yes    Weight blue bands    Reps 10-15    Time 10 Minutes      Recumbant Bike   Level 3    RPM 76    Watts 52    Minutes 15    METs 4      NuStep   Level 4    SPM 127    Minutes 15    METs 3.2          Functional Capacity:  6 Minute Walk     Row Name 11/08/23 1651  12/20/23 1409 03/12/24 1425     6 Minute Walk   Phase Discharge Initial Discharge   Distance 1540 feet 1502 feet 1700 feet   Distance % Change 11.11 % -- 13.18 %   Distance Feet Change 154 ft -- 198 ft   Walk Time 6 minutes 6 minutes 6 minutes   # of Rest Breaks 0 0 0   MPH 2.92 2.84 3.22   METS 2.61 2.45 3.4   RPE 11 11 12    Perceived Dyspnea  0 1 1   VO2 Peak 9.12 8.56 11.89   Symptoms No No No   Resting HR 65 bpm 69 bpm 66 bpm   Resting BP 120/72 110/66 138/76   Resting Oxygen Saturation  -- 98 % 97 %   Exercise Oxygen Saturation  during 6 min walk -- 90 % 89 %   Max Ex. HR 91 bpm 87 bpm 114 bpm   Max Ex. BP 130/80 128/66 166/80   2 Minute Post BP 120/70 122/70 144/70     Interval HR   1 Minute HR -- 69 89   2 Minute HR -- 79 96   3 Minute HR -- 75 110   4 Minute HR -- 80 106   5 Minute HR -- 77 111   6 Minute HR -- 87 114   2 Minute Post HR -- 71 77   Interval Heart Rate? -- Yes Yes     Interval Oxygen   Interval Oxygen? -- Yes Yes   Baseline Oxygen Saturation % -- 98 % 97 %   1 Minute Oxygen Saturation % -- 93 % 94 %   1 Minute Liters of Oxygen -- 0 L 0 L   2 Minute Oxygen Saturation % -- 91 % 89 %   2 Minute Liters of Oxygen -- 0 L 0 L   3 Minute Oxygen Saturation % -- 95 % 97  %   3 Minute Liters of Oxygen -- 0 L 0 L   4 Minute Oxygen Saturation % -- 90 % 95 %   4 Minute Liters of Oxygen -- 0 L 0 L   5 Minute Oxygen Saturation % -- 97 % 99 %   5 Minute Liters of Oxygen -- 0 L 0 L   6 Minute Oxygen Saturation % -- 90 % 99 %   6 Minute Liters of Oxygen -- 0 L 0 L   2 Minute Post Oxygen Saturation % -- 93 % 99 %   2 Minute Post Liters of Oxygen -- 0 L 0 L      Psychological, QOL, Others - Outcomes: PHQ 2/9:    03/12/2024    2:33 PM 12/20/2023    1:11 PM 11/15/2023    1:57 PM 07/23/2023   12:47 PM 02/04/2020    7:53 AM  Depression screen PHQ 2/9  Decreased Interest 0 0 0 0 0  Down, Depressed, Hopeless 0 0 0 0 0  PHQ - 2 Score 0 0 0 0 0  Altered sleeping 1 0 0 1   Tired, decreased energy 1 0 0 1   Change in appetite 1 0 1 0   Feeling bad or failure about yourself  0 0 0 0   Trouble concentrating 0 0 1 0   Moving slowly or fidgety/restless 1 0 0 0   Suicidal thoughts 0 0 0 0   PHQ-9 Score 4 0 2 2   Difficult doing work/chores  Not difficult at all Not  difficult at all Not difficult at all     Quality of Life:  Quality of Life - 11/13/23 1425       Quality of Life Scores   Health/Function Post 27.2 %    Socioeconomic Post 28.21 %    Psych/Spiritual Post 30 %    Family Post 28.5 %    GLOBAL Post 28.17 %          Personal Goals: Goals established at orientation with interventions provided to work toward goal.  Personal Goals and Risk Factors at Admission - 12/20/23 1314       Core Components/Risk Factors/Patient Goals on Admission   Improve shortness of breath with ADL's Yes    Intervention Provide education, individualized exercise plan and daily activity instruction to help decrease symptoms of SOB with activities of daily living.    Expected Outcomes Short Term: Improve cardiorespiratory fitness to achieve a reduction of symptoms when performing ADLs;Long Term: Be able to perform more ADLs without symptoms or delay the onset of symptoms            Personal Goals Discharge:  Goals and Risk Factor Review     Row Name 09/24/23 1206 10/18/23 1555 12/25/23 1515 01/23/24 0928 02/24/24 1411     Core Components/Risk Factors/Patient Goals Review   Personal Goals Review Weight Management/Obesity;Hypertension;Lipids Weight Management/Obesity;Hypertension;Lipids Develop more efficient breathing techniques such as purse lipped breathing and diaphragmatic breathing and practicing self-pacing with activity.;Improve shortness of breath with ADL's Develop more efficient breathing techniques such as purse lipped breathing and diaphragmatic breathing and practicing self-pacing with activity.;Improve shortness of breath with ADL's Develop more efficient breathing techniques such as purse lipped breathing and diaphragmatic breathing and practicing self-pacing with activity.;Improve shortness of breath with ADL's   Review Lady is doing well with exercise at cardiac rehab. . Vital signs have been  stable. Lady is currently absent as he has COVID 19. Ronny plans to return to exercise on 10/02/23. Lady is doing well with exercise at cardiac rehab. . Vital signs have been  stable. Lady will complete cardiac rehab on 11/15/23. Unable to assess goals yet. Lady is scheduled to begin the program on 12/31/23 Monthly review of patient's Core Components/Risk Factors/Patient Goals are as follows: Goal progressing for improving shortness of breath with ADL's. Lady is currently able to maintain sats >88% on RA while exercising on the NuStep and recumbent bike. Goal progressing for developing more efficient breathing techniques such as purse lipped breathing and diaphragmatic breathing; and practicing self-pacing with activity. Lady is practicing his PLB. He knows how to slow down his pace or stop when dyspneic. He is practicing diaphragmatic breathing at home, before bed. We will continue to monitor Ronny's progress throughout the program. Lady will continue to  benefit from PR for nutrition, education, exercise, and lifestyle modification. Monthly review of patient's Core Components/Risk Factors/Patient Goals are as follows: Goal progressing for improving shortness of breath with ADL's. Lady is currently able to maintain sats >88% on RA while exercising on the NuStep and recumbent bike. Goal met for developing more efficient breathing techniques such as purse lipped breathing and diaphragmatic breathing; and practicing self-pacing with activity. Lady is practicing his PLB and can initiate this technique independently. He knows how to slow down his pace or stop when dyspneic. He is practicing diaphragmatic breathing at home, before bed. We will continue to monitor Ronny's progress throughout the program. Lady will continue to benefit from PR for nutrition, education, exercise, and lifestyle modification.  Expected Outcomes Lady will continue to participate in cardiac rehab for exercise nutrition and lifestyle modifications. Lady will continue to participate in cardiac rehab for exercise nutrition and lifestyle modifications. For Ronny to improve his shortness of breath with ADLs, developing more efficient breathing techniques such as purse lipped breathing and diaphragmatic breathing; and practicing self-pacing with activity For Ronny to improve his shortness of breath with ADLs, developing more efficient breathing techniques such as purse lipped breathing and diaphragmatic breathing; and practicing self-pacing with activity Pt will show progress toward meeting expected goals and outcomes.    Row Name 03/17/24 1536             Core Components/Risk Factors/Patient Goals Review   Personal Goals Review Improve shortness of breath with ADL's       Review Ronny graduated from the PR program on 03/17/24. He met his goal for improving SOB with ADL's. His SOB score decreased from 32 to 10 and his CAT scores decreased from 12 to 2. Ronny did great during the program  and we wish him the best.       Expected Outcomes To continue to exercise and modify his nutrition and lifestyle post graduation          Exercise Goals and Review:  Exercise Goals     Row Name 12/20/23 1316             Exercise Goals   Increase Physical Activity Yes       Intervention Provide advice, education, support and counseling about physical activity/exercise needs.;Develop an individualized exercise prescription for aerobic and resistive training based on initial evaluation findings, risk stratification, comorbidities and participant's personal goals.       Expected Outcomes Short Term: Attend rehab on a regular basis to increase amount of physical activity.;Long Term: Exercising regularly at least 3-5 days a week.;Long Term: Add in home exercise to make exercise part of routine and to increase amount of physical activity.       Increase Strength and Stamina Yes       Intervention Provide advice, education, support and counseling about physical activity/exercise needs.;Develop an individualized exercise prescription for aerobic and resistive training based on initial evaluation findings, risk stratification, comorbidities and participant's personal goals.       Expected Outcomes Short Term: Increase workloads from initial exercise prescription for resistance, speed, and METs.;Short Term: Perform resistance training exercises routinely during rehab and add in resistance training at home;Long Term: Improve cardiorespiratory fitness, muscular endurance and strength as measured by increased METs and functional capacity ( )       Able to understand and use rate of perceived exertion (RPE) scale Yes       Intervention Provide education and explanation on how to use RPE scale       Expected Outcomes Short Term: Able to use RPE daily in rehab to express subjective intensity level;Long Term:  Able to use RPE to guide intensity level when exercising independently       Able to understand  and use Dyspnea scale Yes       Intervention Provide education and explanation on how to use Dyspnea scale       Expected Outcomes Short Term: Able to use Dyspnea scale daily in rehab to express subjective sense of shortness of breath during exertion;Long Term: Able to use Dyspnea scale to guide intensity level when exercising independently       Knowledge and understanding of Target Heart Rate Range (THRR) Yes  Intervention Provide education and explanation of THRR including how the numbers were predicted and where they are located for reference       Expected Outcomes Short Term: Able to state/look up THRR;Long Term: Able to use THRR to govern intensity when exercising independently;Short Term: Able to use daily as guideline for intensity in rehab       Understanding of Exercise Prescription Yes       Intervention Provide education, explanation, and written materials on patient's individual exercise prescription       Expected Outcomes Short Term: Able to explain program exercise prescription;Long Term: Able to explain home exercise prescription to exercise independently          Exercise Goals Re-Evaluation:  Exercise Goals Re-Evaluation     Row Name 10/09/23 1622 11/15/23 1626 12/27/23 0733 01/22/24 1144 02/17/24 0754     Exercise Goal Re-Evaluation   Exercise Goals Review Increase Physical Activity;Understanding of Exercise Prescription;Increase Strength and Stamina;Knowledge and understanding of Target Heart Rate Range (THRR);Able to understand and use rate of perceived exertion (RPE) scale Increase Physical Activity;Understanding of Exercise Prescription;Increase Strength and Stamina;Knowledge and understanding of Target Heart Rate Range (THRR);Able to understand and use rate of perceived exertion (RPE) scale Increase Physical Activity;Able to understand and use Dyspnea scale;Understanding of Exercise Prescription;Increase Strength and Stamina;Knowledge and understanding of Target Heart  Rate Range (THRR);Able to understand and use rate of perceived exertion (RPE) scale Increase Physical Activity;Able to understand and use Dyspnea scale;Understanding of Exercise Prescription;Increase Strength and Stamina;Knowledge and understanding of Target Heart Rate Range (THRR);Able to understand and use rate of perceived exertion (RPE) scale Increase Physical Activity;Able to understand and use Dyspnea scale;Understanding of Exercise Prescription;Increase Strength and Stamina;Knowledge and understanding of Target Heart Rate Range (THRR);Able to understand and use rate of perceived exertion (RPE) scale   Comments Reviewed MET's and goals. Pt tolerated exercise well with an average MET level of 2.65. Pt has been out due to illness, but it doing very well since returning. He still feels increased fatigue and some mild brain fog. He states he is keeping up with his PCP about ongoing symptoms. But as far as exercise he is back at his previous [facility] and is doing nicely. Re-evaluated goals are to increase strength and stamina gradually. Pt graduated the [employer]. Pt tolerated exercise well with an average MET level of 3.25. He did very well in the program and increased his by 154 ft for a total of 1513ft. He is also gaining more enduance and balance. He plans to exercise on his own by walking, golf and going to his community fitness center for 30-60 mins 4-5 days. Also gave some info for [employer] in case he was interested Pt has not began PR yet. Will progress as tolerated. Pt has completed 7 exercise sessions. He is exercising on the recumbent stepper for 15 min, level 3, METs 2.8. He then is exercising on the recumbent bike for 15 min, level 3, METs 4. He is tolerating well and progressing slowly. Performing warm up and cool down without limitations, including squats. Pt has completed 14 exercise sessions. He is exercising on the recumbent stepper for 15 min, level 3, METs 3.8. He then is exercising  on the recumbent bike for 15 min, level 3, METs 4.1. He will increase his level on both machines next session. He is tolerating well and progressing. Using blue bands. Performing warm up and cool down without limitations, including squats.   Expected Outcomes Will continue to  monitor pt and progress workloads as tolerated without sign or symptom Will continue to monitor pt and progress workloads as tolerated without sign or symptom Through exercise in rehab and at home, the patient will decrease shortness of breath with daily activities and feel confident in doing an exercise regimen at home. Through exercise in rehab and at home, the patient will decrease shortness of breath with daily activities and feel confident in doing an exercise regimen at home. Through exercise in rehab and at home, the patient will decrease shortness of breath with daily activities and feel confident in doing an exercise regimen at home.    Row Name 03/17/24 1525             Exercise Goal Re-Evaluation   Exercise Goals Review Increase Physical Activity;Able to understand and use Dyspnea scale;Understanding of Exercise Prescription;Increase Strength and Stamina;Knowledge and understanding of Target Heart Rate Range (THRR);Able to understand and use rate of perceived exertion (RPE) scale       Comments Pt completed 22 exercise sessions. He progressed to 3.7 METs on the recumbent stepper and METs 4.1 on the recumbent bike. He increased his 6 min walk test by 200 ft, 13%. His SOB score decreased from 32 to 10 and his CAT score decreased from 12 to 2. He was very successful in the program and also thankful. He plans to continue exercising on his own.       Expected Outcomes Through exercise in rehab and at home, the patient will decrease shortness of breath with daily activities and feel confident in doing an exercise regimen at home.          Nutrition & Weight - Outcomes:  Pre Biometrics - 12/20/23 1304       Pre  Biometrics   Grip Strength 30 kg          Post Biometrics - 03/12/24 1515        Post  Biometrics   Grip Strength 32 kg          Nutrition:  Nutrition Therapy & Goals - 03/03/24 1607       Nutrition Therapy   Diet Heart Healthy Diet    Drug/Food Interactions Statins/Certain Fruits      Personal Nutrition Goals   Nutrition Goal Patient to limit sodium intake to 2300mg  per day   goal in progress.   Personal Goal #2 Patient to improve diet quality by using the plate method as a guide for meal planning to include lean protein/plant protein, fruits, vegetables, whole grains, nonfat dairy as part of a well-balanced diet.   goal in progress.   Comments Goals in progress. Lady recently graduated from cardiac rehab in February 2025 s/p TAVR. Patient with medical history of s/p TAVR, hyperlipidemia, CAD, aortic stenosis, s/p coronary artery stent placement, and CHF. Ronny's LDL remains well controlled. He has excellent nutrition knowledge (including sodium recommendations, fiber intake, saturated fat intake, etc) following completion of intensive cardiac rehab and the [employer] education series.He is down ~4# since starting pulmonary rehab. Lasix  was recently increased  Patient will benefit from participation in pulmonary rehab for for nutrition, exercise, and lifestyle modification.      Intervention Plan   Intervention Prescribe, educate and counsel regarding individualized specific dietary modifications aiming towards targeted core components such as weight, hypertension, lipid management, diabetes, heart failure and other comorbidities.;Nutrition handout(s) given to patient.    Expected Outcomes Short Term Goal: Understand basic principles of dietary content, such as calories, fat, sodium, cholesterol  and nutrients.;Long Term Goal: Adherence to prescribed nutrition plan.          Nutrition Discharge:  Nutrition Assessments - 03/12/24 1431       Rate Your Plate Scores   Post Score  79          Education Questionnaire Score:  Knowledge Questionnaire Score - 03/12/24 1433       Knowledge Questionnaire Score   Pre Score 15/18    Post Score 17/18          Goals reviewed with patient; copy given to patient.

## 2024-03-17 NOTE — Progress Notes (Signed)
 Daily Session Note  Patient Details  Name: Danny [name] MRN: [id number] Date of Birth: January 08, 1936 Referring Provider:   Conrad Ports Pulmonary Rehab Walk Test from 12/20/2023 in [facility]  Referring Provider Rolan    Encounter Date: 03/17/2024  Check In:  Session Check In - 03/17/24 1350       Check-In   Supervising physician immediately available to respond to emergencies [facility] MD immediately available    Physician(s) Jackee Alberts, NP    Location [facility]    Staff Present Cloyd Aris BS, ACSM-CEP, Exercise Physiologist;Kaylee Nicholaus, MS, ACSM-CEP, Exercise Physiologist;Alyanah Elliott Claudene Candia Levin, RN, BSN    Virtual Visit No    Medication changes reported     No    Fall or balance concerns reported    No    Tobacco Cessation No Change    Warm-up and Cool-down Performed as group-led instruction    Resistance Training Performed Yes    VAD Patient? No    PAD/SET Patient? No      Pain Assessment   Currently in Pain? No/denies    Multiple Pain Sites No          Capillary Blood Glucose: No results found for this or any previous visit (from the past 24 hours).    Social History   Tobacco Use  Smoking Status Never  Smokeless Tobacco Never    Goals Met:  Proper associated with RPD/PD & O2 Sat Independence with exercise equipment Exercise tolerated well No report of concerns or symptoms today Strength training completed today  Goals Unmet:  Not Applicable  Comments: Service time is from 1304 to 1433.    Dr. Slater Staff is Medical Director for Pulmonary Rehab at [facility].

## 2024-03-20 ENCOUNTER — Other Ambulatory Visit: Payer: Self-pay

## 2024-03-20 ENCOUNTER — Emergency Department (HOSPITAL_COMMUNITY)
Admission: EM | Admit: 2024-03-20 | Discharge: 2024-03-20 | Disposition: A | Discharge status: Home / Self Care | Attending: Student | Admitting: Student

## 2024-03-20 DIAGNOSIS — Z7982 Long term (current) use of aspirin: Secondary | ICD-10-CM | POA: Diagnosis not present

## 2024-03-20 DIAGNOSIS — R001 Bradycardia, unspecified: Secondary | ICD-10-CM | POA: Diagnosis not present

## 2024-03-20 DIAGNOSIS — I2510 Atherosclerotic heart disease of native coronary artery without angina pectoris: Secondary | ICD-10-CM | POA: Diagnosis not present

## 2024-03-20 DIAGNOSIS — R0689 Other abnormalities of breathing: Secondary | ICD-10-CM | POA: Diagnosis not present

## 2024-03-20 DIAGNOSIS — R079 Chest pain, unspecified: Secondary | ICD-10-CM | POA: Diagnosis not present

## 2024-03-20 DIAGNOSIS — R55 Syncope and collapse: Secondary | ICD-10-CM | POA: Diagnosis not present

## 2024-03-20 DIAGNOSIS — I959 Hypotension, unspecified: Secondary | ICD-10-CM | POA: Diagnosis not present

## 2024-03-20 DIAGNOSIS — Z7902 Long term (current) use of antithrombotics/antiplatelets: Secondary | ICD-10-CM | POA: Insufficient documentation

## 2024-03-20 LAB — COMPREHENSIVE METABOLIC PANEL WITH GFR
ALT: 18 U/L (ref 0–44)
AST: 22 U/L (ref 15–41)
Albumin: 3.4 g/dL — ABNORMAL LOW (ref 3.5–5.0)
Alkaline Phosphatase: 44 U/L (ref 38–126)
Anion gap: 10 (ref 5–15)
BUN: 20 mg/dL (ref 8–23)
CO2: 22 mmol/L (ref 22–32)
Calcium: 9 mg/dL (ref 8.9–10.3)
Chloride: 106 mmol/L (ref 98–111)
Creatinine, Ser: 0.98 mg/dL (ref 0.61–1.24)
GFR, Estimated: >60 mL/min (ref >60)
Glucose, Bld: 149 mg/dL — ABNORMAL HIGH (ref 70–99)
Potassium: 3.5 mmol/L (ref 3.5–5.1)
Sodium: 138 mmol/L (ref 135–145)
Total Bilirubin: 0.7 mg/dL (ref 0.0–1.2)
Total Protein: 5.7 g/dL — ABNORMAL LOW (ref 6.5–8.1)

## 2024-03-20 LAB — CBC WITH DIFFERENTIAL/PLATELET
Abs Immature Granulocytes: 0.02 K/uL (ref 0.00–0.07)
Basophils Absolute: 0.1 K/uL (ref 0.0–0.1)
Basophils Relative: 1 %
Eosinophils Absolute: 0.7 K/uL — ABNORMAL HIGH (ref 0.0–0.5)
Eosinophils Relative: 8 %
HCT: 36.7 % — ABNORMAL LOW (ref 39.0–52.0)
Hemoglobin: 12.3 g/dL — ABNORMAL LOW (ref 13.0–17.0)
Immature Granulocytes: 0 %
Lymphocytes Relative: 23 %
Lymphs Abs: 2 K/uL (ref 0.7–4.0)
MCH: 30.9 pg (ref 26.0–34.0)
MCHC: 33.5 g/dL (ref 30.0–36.0)
MCV: 92.2 fL (ref 80.0–100.0)
Monocytes Absolute: 0.9 K/uL (ref 0.1–1.0)
Monocytes Relative: 10 %
Neutro Abs: 5 K/uL (ref 1.7–7.7)
Neutrophils Relative %: 58 %
Platelets: 162 K/uL (ref 150–400)
RBC: 3.98 MIL/uL — ABNORMAL LOW (ref 4.22–5.81)
RDW: 12.9 % (ref 11.5–15.5)
WBC: 8.7 K/uL (ref 4.0–10.5)
nRBC: 0 % (ref 0.0–0.2)

## 2024-03-20 LAB — TROPONIN I (HIGH SENSITIVITY)
Troponin I (High Sensitivity): 5 ng/L (ref <18)
Troponin I (High Sensitivity): 4 ng/L (ref <18)

## 2024-03-20 MED ORDER — LACTATED RINGERS IV BOLUS
500.0000 mL | Freq: Once | INTRAVENOUS | Status: AC
  Administered 2024-03-20: 500 mL via INTRAVENOUS

## 2024-03-20 NOTE — ED Provider Notes (Signed)
 [place] [facility]MENT AT [facility] Provider Note  CSN: [id number] Arrival date & time: 03/20/24 0111  Chief Complaint(s) Hypotension  HPI Danny [name] is a 88 y.o. male with PMH CAD, BPH, aortic stenosis status post TAVR who presents emergency room for evaluation of the syncopal episode.  Patient states that he has had some isolated left lower extremity swelling for multiple months that had been previously evaluated by DVT ultrasound.  This was negative and he was told to increase his Lasix  this week.  Patient had a short episode of chest pain and took a nitroglycerin .  Shortly after taking nitroglycerin  he felt very lightheaded, sat down in his recliner and had a syncopal episode.  Patient found to be hypotensive in the field by EMS but blood pressures have improved with fluid resuscitation given by EMS.  Here in the emergency room and he is asymptomatic denying chest pain, shortness of breath, Donnell pain, nausea, vomiting or other systemic symptoms.  Wife states that patient did not have a postictal period and returned to normal to status baseline in approximately 30 seconds.   Past Medical History Past Medical History:  Diagnosis Date   Allergic rhinitis    Arthritis    BPH (benign prostatic hypertrophy)    Dr. Renda   Cervical disc disease    Coronary artery disease    Patient developed exertional dyspnea. ETT-myoview  (8/12) with 11 METS, normal LV EF, normal perfusion at rest and stress but elevated TID ratio. Coronary CT angiogram in 9/12 showed equivocal significant disease in the CFX. Cath in 10/12 showed EF 55-60%, 30% mid RCA, 40% distal RCA, 40% proximal LAD. ETT-Myoview  (10/14): Diaphragmatic attenuation, no ischemia, EF 62%, normal study   Detached vitreous humor    Diverticulosis    GERD (gastroesophageal reflux disease)    Hemorrhoids    Impaired fasting glucose    Ingrown toenail    Lumbar radiculopathy    Numbness and tingling in left arm     Onychomycosis    Rotator cuff disorder    S/P TAVR (transcatheter aortic valve replacement) 06/11/2023   s/p TAVR with a 26 mm Edwards S3UR via the TF approach by Dr. 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 Authorizing Provider  amoxicillin  (AMOXIL ) 500 MG capsule Take 4 capsules (2,000 mg total) by mouth once as needed for up to 1 dose. 1 HOUR PRIOR TO DENTAL CLEANINGS 07/17/23   Arvil Poe D, NP  aspirin  EC 81 MG tablet Take 81 mg by mouth in the morning.    [provider]  betamethasone  dipropionate 0.05 % cream Apply 0.5 application  topically 2 (two) times daily as needed (dry/irritated skin).    [provider]  Calcium -Magnesium  (CAL-MAG PO) Take 1 tablet by mouth in the morning. With Potassium 500 mg /500 mg /99 mg    [provider]  Cholecalciferol (VITAMIN D3)  250 MCG (10000 UT) capsule Take 10,000 Units by mouth in the morning, at noon, and at bedtime.    [provider]  clopidogrel  (PLAVIX ) 75 MG tablet Take 1 tablet (75 mg total) by mouth daily with breakfast. 05/04/23   Duke, Jon Garre, PA  Coenzyme Q10 (CO Q 10 PO) Take 400 mg by mouth in the morning.    [provider]  finasteride  (PROSCAR ) 5 MG tablet Take 5 mg by mouth in the morning. 06/15/21   [provider]  furosemide  (LASIX ) 20 MG tablet Take 2 tablets (40 mg  total) by mouth daily. 03/17/24   Milford, Harlene HERO, FNP  gabapentin  (NEURONTIN ) 100 MG capsule Take 1 capsule (100 mg total) by mouth daily as needed. 12/19/23   Skeet Juliene SAUNDERS, DO  gabapentin  (NEURONTIN ) 600 MG tablet TAKE 1 TABLET AT 4PM AND TAKE 1 TABLET AT BEDTIME. 04/30/23   Skeet, Adam R, DO  Magnesium  Oxide 400 MG CAPS Take 1 capsule (400 mg total) by mouth daily. 05/18/16   Rolan Ezra RAMAN, MD  metoprolol  succinate (TOPROL -XL) 25 MG 24 hr tablet TAKE ONE TABLET BY MOUTH DAILY 12/16/23   McLean, Dalton S, MD  Multiple Vitamin (MULTIVITAMIN WITH MINERALS) TABS tablet Take 1 tablet by mouth daily. Centrum Silver    [provider]  Multiple Vitamins-Minerals (PRESERVISION AREDS 2) CAPS Take 1 capsule by mouth in the morning and at bedtime.    [provider]  nitroGLYCERIN  (NITROSTAT ) 0.4 MG SL tablet ONE TABLET UNDER TONGUE WHEN NEEDED FOR CHEST PAIN. MAY REPEAT IN 5 MINUTES. 12/13/22   Bensimhon, Toribio SAUNDERS, MD  pantoprazole  (PROTONIX ) 40 MG tablet Take 1 tablet (40 mg total) by mouth daily. 05/03/23 05/02/24  Madie Jon Garre, PA  Probiotic Product (PROBIOTIC-10 PO) Take 1 capsule by mouth in the morning.    [provider]  Propylene Glycol (SYSTANE BALANCE OP) Apply 1 drop to eye as needed.    [provider]  rosuvastatin  (CRESTOR ) 20 MG tablet Take 1 tablet (20 mg total) by mouth daily. 12/10/23 03/09/24  Rolan Ezra RAMAN, MD  Saw Palmetto 160 MG CAPS Take 160 mg by mouth 2 (two) times daily.    [provider]  traMADol  (ULTRAM ) 50 MG tablet Take 50 mg by mouth 3 (three) times daily as needed (back pain.). 07/11/21   [provider]  traZODone  (DESYREL ) 50 MG tablet Take 25 mg by mouth at bedtime as needed for sleep. 05/12/20   [provider]  Turmeric (QC TUMERIC COMPLEX) 500 MG CAPS Take 500 mg by mouth in the morning.    [provider]  zolpidem  (AMBIEN ) 10 MG tablet Take 10 mg by mouth at bedtime.    [provider]  Past Surgical History Past Surgical History:  Procedure Laterality Date   abnormal stress test     APPENDECTOMY  09/18/1955   BACK SURGERY  7989,7988   lumbar fusion-   CARDIAC CATHETERIZATION     COLONOSCOPY  2017   CORONARY STENT INTERVENTION [name] 05/02/2023   Procedure: CORONARY STENT INTERVENTION;  Surgeon: Wendel Lurena POUR, MD;  Location: [facility];  Service: Cardiovascular;  Laterality: [name];   HEMORRHOID SURGERY  09/18/1963   HERNIA REPAIR  09/17/1994   RIH   INTRAOPERATIVE TRANSTHORACIC ECHOCARDIOGRAM [name] 06/11/2023   Procedure: INTRAOPERATIVE TRANSTHORACIC ECHOCARDIOGRAM;  Surgeon: Wendel Lurena POUR, MD;  Location: [facility];  Service: Open Heart Surgery;  Laterality: [name];   LIPOMA EXCISION  09/05/2011   Procedure: EXCISION LIPOMA;  Surgeon: Sherlean JINNY Laughter, MD;  Location: [facility];  Service: General;  Laterality: Left;  removal of 4 cm lipoma of back   LUMBAR LAMINECTOMY/DECOMPRESSION MICRODISCECTOMY Right 10/05/2015   Procedure: Extraforaminal Microdiscectomy  - L4-L5 - L5-S1 - right;  Surgeon: Alm GORMAN Molt, MD;  Location: [facility];  Service: Neurosurgery;  Laterality: Right;  Extraforaminal Microdiscectomy  - L4-L5 - L5-S1 - right   melonoma Right    removed from right arm   RIGHT HEART CATH AND CORONARY ANGIOGRAPHY [name] 05/02/2023   Procedure: RIGHT HEART CATH AND CORONARY ANGIOGRAPHY;  Surgeon: Rolan Ezra GORMAN, MD;  Location: [facility];  Service: Cardiovascular;  Laterality: [name];   RIGHT/LEFT HEART CATH AND CORONARY ANGIOGRAPHY [name] 05/02/2023   Procedure: RIGHT/LEFT HEART CATH AND CORONARY ANGIOGRAPHY;  Surgeon: Rolan Ezra GORMAN, MD;  Location: [facility] CV LAB;  Service: Cardiovascular;  Laterality: [name];   ROTATOR CUFF REPAIR  07/17/2007   left   SPINE SURGERY  07/06/10 and 09/07/09   spinal  fusion   TONSILLECTOMY AND ADENOIDECTOMY  09/17/1945   TRANSCATHETER AORTIC VALVE REPLACEMENT, TRANSFEMORAL Right 06/11/2023   Procedure: Transcatheter Aortic Valve Replacement, Transfemoral;  Surgeon: Thukkani, Arun K, MD;  Location: [facility];  Service: Open Heart Surgery;  Laterality: Right;   Family History Family History  Problem Relation Age of Onset   Cancer Mother        multiple myeloma   Hypertension Mother    Stroke Father    Heart attack Neg Hx     Social History Social History   Tobacco Use   Smoking status: Never   Smokeless tobacco: Never  Vaping Use   Vaping status: Never Used  Substance Use Topics   Alcohol use: Yes    Comment: occasionally   Drug use: No   Allergies Patient has no known allergies.  Review of Systems Review of Systems  Neurological:  Positive for syncope.    Physical Exam Vital Signs  I have reviewed the triage vital signs BP 121/73   Pulse (!) 59   Temp 97.9 F (36.6 C) (Oral)   Resp (!) 21   Ht 5' 10 (1.778 m)   Wt 77.1 kg   SpO2 100%   BMI 24.39 kg/m   Physical Exam Constitutional:      General: He is not in acute distress.    Appearance: Normal appearance.  HENT:     Head: Normocephalic and atraumatic.     Nose: No congestion or rhinorrhea.  Eyes:     General:        Right eye: No discharge.        Left eye: No discharge.  Extraocular Movements: Extraocular movements intact.     Pupils: Pupils are equal, round, and reactive to light.  Cardiovascular:     Rate and Rhythm: Normal rate and regular rhythm.     Heart sounds: No murmur heard. Pulmonary:     Effort: No respiratory distress.     Breath sounds: No wheezing or rales.  Abdominal:     General: There is no distension.     Tenderness: There is no abdominal tenderness.  Musculoskeletal:        General: Normal range of motion.     Cervical back: Normal range of motion.     Left lower leg: Edema present.  Skin:    General: Skin is warm and  dry.  Neurological:     General: No focal deficit present.     Mental Status: He is alert.     ED Results and Treatments Labs (all labs ordered are listed, but only abnormal results are displayed) Labs Reviewed  COMPREHENSIVE METABOLIC PANEL WITH GFR - Abnormal; Notable for the following components:      Result Value   Glucose, Bld 149 (*)    Total Protein 5.7 (*)    Albumin 3.4 (*)    All other components within normal limits  CBC WITH DIFFERENTIAL/PLATELET - Abnormal; Notable for the following components:   RBC 3.98 (*)    Hemoglobin 12.3 (*)    HCT 36.7 (*)    Eosinophils Absolute 0.7 (*)    All other components within normal limits  TROPONIN I (HIGH SENSITIVITY)  TROPONIN I (HIGH SENSITIVITY)                                                                                                                          Radiology No results found.  Pertinent labs & imaging results that were available during my care of the patient were reviewed by me and considered in my medical decision making (see MDM for details).  Medications Ordered in ED Medications  lactated ringers  bolus 500 mL (0 mLs Intravenous Stopped 03/20/24 0345)                                                                                                                                     Procedures Procedures  (including critical care time)  Medical Decision Making / ED Course   This patient presents to the ED for concern of  syncope, this involves an extensive number of treatment options, and is a complaint that carries with it a high risk of complications and morbidity.  The differential diagnosis includes orthostatic syncope, cardiogenic syncope, vasovagal syncope, electrolyte abnormality, dehydration, dysrhythmia, vasovagal, Hypoglycemia, Seizure, Autonomic Insufficiency  MDM: Patient seen emerged from for evaluation of syncope.  Physical exam is largely unremarkable outside of some mild asymmetric lower  extremity edema left greater than right.  Laboratory evaluation with a hemoglobin of 12.2 but is otherwise unremarkable.  High sensitive troponin is normal.  ECG without evidence of ischemia, WPW or Brugada.  Patient received an additional 500 cc bolus of fluid and orthostatic vital signs on reevaluation were unremarkable.  Suspect patient likely was in a state of relative dehydration from his double Lasix  dosing and experienced temporary hypotension from the nitroglycerin  use.  He was encouraged to be very cautious with nitroglycerin  use especially in the setting of active diuresis of which he voiced understanding.  In regards to the patient's lower extremity swelling, he states that this has not been any worse than previous and it was previously evaluated with a DVT ultrasound this was negative.  Lower suspicion for PE and patient is low risk by Wells criteria and thus we will defer CT PE imaging in the ER today.  At this time he does not meet inpatient criteria for admission will be discharged with outpatient follow-up.  Return precautions given which he and his wife voiced understanding.   Additional history obtained: -Additional history obtained from wife -External records from outside source obtained and reviewed including: Chart review including previous notes, labs, imaging, consultation notes   Lab Tests: -I ordered, reviewed, and interpreted labs.   The pertinent results include:   Labs Reviewed  COMPREHENSIVE METABOLIC PANEL WITH GFR - Abnormal; Notable for the following components:      Result Value   Glucose, Bld 149 (*)    Total Protein 5.7 (*)    Albumin 3.4 (*)    All other components within normal limits  CBC WITH DIFFERENTIAL/PLATELET - Abnormal; Notable for the following components:   RBC 3.98 (*)    Hemoglobin 12.3 (*)    HCT 36.7 (*)    Eosinophils Absolute 0.7 (*)    All other components within normal limits  TROPONIN I (HIGH SENSITIVITY)  TROPONIN I (HIGH SENSITIVITY)       EKG   EKG Interpretation Date/Time:  Friday March 20 2024 02:06:26 EDT Ventricular Rate:  61 PR Interval:  79 QRS Duration:  104 QT Interval:  436 QTC Calculation: 440 R Axis:   -37  Text Interpretation: Sinus rhythm Short PR interval Confirmed by Saraiah Bhat ([id number]) on 03/20/2024 7:12:07 AM        Medicines ordered and prescription drug management: Meds ordered this encounter  Medications   lactated ringers  bolus 500 mL    -I have reviewed the patients home medicines and have made adjustments as needed  Critical interventions none   Cardiac Monitoring: The patient was maintained on a cardiac monitor.  I personally viewed and interpreted the cardiac monitored which showed an underlying rhythm of: NSR  Social Determinants of Health:  Factors impacting patients care include: none   Reevaluation: After the interventions noted above, I reevaluated the patient and found that they have :improved  Co morbidities that complicate the patient evaluation  Past Medical History:  Diagnosis Date   Allergic rhinitis    Arthritis    BPH (benign prostatic hypertrophy)    Dr. Renda  Cervical disc disease    Coronary artery disease    Patient developed exertional dyspnea. ETT-myoview  (8/12) with 11 METS, normal LV EF, normal perfusion at rest and stress but elevated TID ratio. Coronary CT angiogram in 9/12 showed equivocal significant disease in the CFX. Cath in 10/12 showed EF 55-60%, 30% mid RCA, 40% distal RCA, 40% proximal LAD. ETT-Myoview  (10/14): Diaphragmatic attenuation, no ischemia, EF 62%, normal study   Detached vitreous humor    Diverticulosis    GERD (gastroesophageal reflux disease)    Hemorrhoids    Impaired fasting glucose    Ingrown toenail    Lumbar radiculopathy    Numbness and tingling in left arm    Onychomycosis    Rotator cuff disorder    S/P TAVR (transcatheter aortic valve replacement) 06/11/2023   s/p TAVR with a 26 mm Edwards S3UR via the  TF approach by Dr. 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     Final Clinical Impression(s) / ED Diagnoses Final diagnoses:  Syncope, unspecified syncope type     @PCDICTATION @    Albertina Dixon, MD 03/20/24 [phone]

## 2024-03-20 NOTE — ED Triage Notes (Signed)
 Pt BIB EMS from home. C/o CP, took home nitroglycerin  with releife provided. Pt then became dizzy and weak, and hypotensive.  EMS reports pt had Episode of incontinence.   Pt reports recent increase in lasix  dose  A&Ox3 initially, A&Ox4 on arrival   EMS VS 78/60, 106/72 after ivf, cbg 175, HR 60's

## 2024-03-23 ENCOUNTER — Telehealth (HOSPITAL_COMMUNITY): Payer: Self-pay

## 2024-03-23 NOTE — Telephone Encounter (Signed)
 Patients wife, Heron advised and verbalized understanding.

## 2024-03-23 NOTE — Telephone Encounter (Signed)
 Patients wife, Lael called wanting to know if patient should Ronny decrease lasix  again, as well as cancel patients lab appt for Wednesday. Patient had a syncopal episode on July 4th 2025 and was transported to the ER via EMS.No medication changes were made upon discharge from the ER. Patient had blood work done during the ER Visit. Please advise.               Component Ref Range & Units (hover) 3 d ago (03/20/24) 1 mo ago (01/24/24) 6 mo ago (08/28/23) 8 mo ago (07/17/23) 9 mo ago (06/12/23) 9 mo ago (06/07/23) 10 mo ago (05/03/23)  Sodium 138 140 136 140 R 135 137 136  Potassium 3.5 4.6 4.1 4.6 R 3.8 3.9 4.0  Chloride 106 105 105 104 R 104 104 106  CO2 22 28 25 23  R 21 Low  25 21 Low   Glucose, Bld 149 High  109 High  CM 106 High  CM 111 High  115 High  CM 97 CM 79 CM  Comment: Glucose reference range applies only to samples taken after fasting for at least 8 hours.  BUN 20 14 13 16  R 11 14 14   Creatinine, Ser 0.98 0.98 0.89 0.88 R 0.96 0.83 0.83  Calcium  9.0 9.7 9.7 9.5 R 9.0 9.0 8.9  Total Protein 5.7 Low  6.0 Low     6.4 Low    Albumin 3.4 Low  3.7    3.8   AST 22 24    18    ALT 18 20    19    Alkaline Phosphatase 44 49    47   Total Bilirubin 0.7 1.1    1.0 R   GFR, Estimated >60 >60 CM >60 CM  >60 CM >60 CM >60 CM  Comment: (NOTE) Calculated using the CKD-EPI Creatinine Equation (2021)  Anion gap 10 7 CM 6 CM  10 CM 8 CM 9 CM  Comment: Performed at [facility], [address] [place], [place] [address]  Resulting Agency [facility]        Specimen Collected: 03/20/24 01:33

## 2024-03-25 ENCOUNTER — Other Ambulatory Visit (HOSPITAL_COMMUNITY)

## 2024-03-31 ENCOUNTER — Other Ambulatory Visit: Payer: Self-pay

## 2024-03-31 DIAGNOSIS — Z952 Presence of prosthetic heart valve: Secondary | ICD-10-CM

## 2024-04-08 DIAGNOSIS — I350 Nonrheumatic aortic (valve) stenosis: Secondary | ICD-10-CM | POA: Diagnosis not present

## 2024-04-08 DIAGNOSIS — H903 Sensorineural hearing loss, bilateral: Secondary | ICD-10-CM | POA: Diagnosis not present

## 2024-04-08 DIAGNOSIS — I7121 Aneurysm of the ascending aorta, without rupture: Secondary | ICD-10-CM | POA: Diagnosis not present

## 2024-04-08 DIAGNOSIS — E7800 Pure hypercholesterolemia, unspecified: Secondary | ICD-10-CM | POA: Diagnosis not present

## 2024-04-08 DIAGNOSIS — D649 Anemia, unspecified: Secondary | ICD-10-CM | POA: Diagnosis not present

## 2024-04-08 DIAGNOSIS — R739 Hyperglycemia, unspecified: Secondary | ICD-10-CM | POA: Diagnosis not present

## 2024-04-08 DIAGNOSIS — Z0000 Encounter for general adult medical examination without abnormal findings: Secondary | ICD-10-CM | POA: Diagnosis not present

## 2024-04-08 DIAGNOSIS — M545 Low back pain, unspecified: Secondary | ICD-10-CM | POA: Diagnosis not present

## 2024-04-08 DIAGNOSIS — Z1331 Encounter for screening for depression: Secondary | ICD-10-CM | POA: Diagnosis not present

## 2024-04-08 DIAGNOSIS — I2510 Atherosclerotic heart disease of native coronary artery without angina pectoris: Secondary | ICD-10-CM | POA: Diagnosis not present

## 2024-04-08 DIAGNOSIS — K219 Gastro-esophageal reflux disease without esophagitis: Secondary | ICD-10-CM | POA: Diagnosis not present

## 2024-04-08 DIAGNOSIS — D692 Other nonthrombocytopenic purpura: Secondary | ICD-10-CM | POA: Diagnosis not present

## 2024-04-08 DIAGNOSIS — G629 Polyneuropathy, unspecified: Secondary | ICD-10-CM | POA: Diagnosis not present

## 2024-04-08 DIAGNOSIS — R54 Age-related physical debility: Secondary | ICD-10-CM | POA: Diagnosis not present

## 2024-04-09 DIAGNOSIS — M47817 Spondylosis without myelopathy or radiculopathy, lumbosacral region: Secondary | ICD-10-CM | POA: Diagnosis not present

## 2024-04-14 ENCOUNTER — Other Ambulatory Visit: Payer: Self-pay

## 2024-04-15 ENCOUNTER — Other Ambulatory Visit (HOSPITAL_COMMUNITY): Payer: Self-pay | Admitting: Cardiology

## 2024-04-23 DIAGNOSIS — Z961 Presence of intraocular lens: Secondary | ICD-10-CM | POA: Diagnosis not present

## 2024-04-23 DIAGNOSIS — H52203 Unspecified astigmatism, bilateral: Secondary | ICD-10-CM | POA: Diagnosis not present

## 2024-04-24 ENCOUNTER — Other Ambulatory Visit: Payer: Self-pay | Admitting: Neurology

## 2024-04-28 ENCOUNTER — Telehealth (HOSPITAL_COMMUNITY): Payer: Self-pay

## 2024-04-28 NOTE — Progress Notes (Deleted)
 NEUROLOGY FOLLOW UP OFFICE NOTE  Danny [name] [id number]  Assessment/Plan:   Idiopathic polyneuropathy Lumbar spondylosis with history of multiple lumbar spine surgeries   Gabapentin  to 600mg  at 4PM and 600mg  at bedtime and *** as needed. 3.  Follow up 1 year.   Subjective:  Danny [name] is an 88 year old right-handed White male with chronic hepatitis, cervical disc disease, lumbar spine disease s/p multiple surgeries,CAD, s/p TAVR and history of ischemic left sixth nerve palsy who follows up for polyneuropathy.   UPDATE: Current medication:  gabapentin  600mg  at 4PM and 600mg  at bedtime.  Also takes tramadol  50mg  PRN for back pain.              Pain is noticeable during the day when there is rubbing on his feet, such as while wearing shoes or socks.  It interferes when he is out of the house, such as playing golf.  When he can rest without shoes on, it is better.  Massages them every night which helps.  It is worse after on his feet or exercising for a while.  Uses a foot cream with arnica, aloe and I-arginine and also uses a magnesium  balm which help.  ***   HISTORY: In 2008, he began experiencing left upper extremity numbness and tingling following rotator cuff surgery.  It involves only the hand and is fairly constant.  No pain or weakness.  He is still able to perform daily activities such as chores around the house or playing golf.  EMG in 2014 reportedly negative.  Saw Dr. Buck at [facility] in 2014.  NCV-EMG of left upper extremity was reportedly normal.  MRI of cervical spine showed mild degenerative disc disease but no significant central or foraminal stenosis.  MRI of brain showed mild age-related changes but overall unremarkable.  No specific diagnosis was determined.  It has been unchanged over the years.  Repeat NCV-EMG of left upper extremity on 11/25/2019 was normal.     For many years, he has had numbness and tingling in the feet as well as  intermittent muscle cramps in the calves.  He has changed from Lipitor to Crestor  with no improvement.  He typically treats with mustard and apple cider vinegar.  At night, he notes burning and stinging pain in the feet.  Bood work in 2014 showed negative ANA, negative RF, CRP 1.3, Hgb A1c 6.3, CK 116, elevated TSH 5.370, but subsequent TSH has been normal.  He has history of multiple back surgeries.  He had sinal fusion L3-4 in 2010, followed by spinal fusion L2-3 in 2011.  To evaluate low back pain with right leg numbness, he underwent MRI of lumbar spine on 03/26/2015 which showed interbody fusion at L2-3 and L3-4 without significant stenosis and disc degeneration and spurring causing right foraminal encroachment at L4-5 and L5-S1.   He underwent extra-foraminal microdiskectomy at L4-5 and L5-S1 in January 2017 but then developed left lower back pain into the hip.  Myelogram on 07/12/2016 showed solid fusion at L2-3 and L3-4 as well as multilevel subarticular and foraminal stenosis.  NCV-EMG of the lower extremities from 11/30/2019 showed moderate to severe mixed axonal and demyelinating sensorimotor polyneuropathy but no evidence of lumbar radiculopathy, plexopathy or myopathy.  Labs from May 2021 include B12 459, folate >23.7, Mg 2.2, TSH 2.17, free T4 0.83, and SPEP/IFE negative for monoclonal protein.    In June 2021, he developed double vision.  Double vision was only noticeable unless he looks  to the left greater than right.  He reports history of 6th nerve palsy 20 years ago and thought it was a recurrence.  He saw ophthalmology who diagnosed left 6th nerve palsy .  He also had a left sided headache above the left eye, which was previously found to be sinusitis. He saw ENT for possible sinus infection, which was ruled out.  MRI of brain with and without contrast on 05/05/2020 showed chronic small vessel ischemic changes in the cerebral white matter but no brainstem or cavernous sinus abnormalities.  Labs  included sed rate 9, CRP 0.8.and Ace 11.  Diplopia subsequently resolved.  Dull left frontal headache about 3 times a week.  It happens when he gets up in the morning and is resolved by afternoon.  The headaches are chronic, for many years.   Past medication: Lyrica    Imaging: 05/20/2013 MRI CERVICAL SPINE WO:  Mildly abnormal MRI scan of the cervical spine showing exaggerated forward lordotic curvature and minor disc degenerative changes without significant compression. 06/18/2013 MRI BRAIN W WO:  Abnormal MRI scan of the brain showing mild changes of chronic microvascular ischemia and generalized cerebral atrophy. 03/26/2015 MRI LUMBAR SPINE WO:  interbody fusion at L2-3 and L3-4 without significant stenosis.  Lumbar dextroscoliosis.  Disc degeneration and spurring causing right foraminal encroachment.  L4-5 and L5-S1, similar to 2011. 07/12/2016 CT MYELOGRAM LUMBAR SPINE:  1. Solid fusion at L2-3 and L3-4.  2. Progressive adjacent level disease at L1-2 with progressive moderate left and mild right subarticular stenosis.  3. Moderate left foraminal narrowing at L1-2.  4. Moderate left subarticular and mild left foraminal stenosis at L2-3 without significant interval change.  5. Moderate left subarticular and mild left foraminal narrowing at L3-4 is stable. This is predominantly due to facet disease.  6. Mild subarticular and foraminal narrowing bilaterally at L4-5.  7. Mild subarticular narrowing at L5-S1 is worse on the right.  8. Moderate right and mild left foraminal narrowing at L5-S1 is stable. 05/05/2020 MRI BRAIN W WO:  No explanation for 6 nerve palsy. Brainstem and cavernous sinus normal. No orbital mass.  No acute abnormality. Progression of chronic microvascular ischemic change in the white matter compared with MRI of 06/18/2013  PAST MEDICAL HISTORY: Past Medical History:  Diagnosis Date   Allergic rhinitis    Arthritis    BPH (benign prostatic hypertrophy)    Dr. Renda   Cervical  disc disease    Coronary artery disease    Patient developed exertional dyspnea. ETT-myoview  (8/12) with 11 METS, normal LV EF, normal perfusion at rest and stress but elevated TID ratio. Coronary CT angiogram in 9/12 showed equivocal significant disease in the CFX. Cath in 10/12 showed EF 55-60%, 30% mid RCA, 40% distal RCA, 40% proximal LAD. ETT-Myoview  (10/14): Diaphragmatic attenuation, no ischemia, EF 62%, normal study   Detached vitreous humor    Diverticulosis    GERD (gastroesophageal reflux disease)    Hemorrhoids    Impaired fasting glucose    Ingrown toenail    Lumbar radiculopathy    Numbness and tingling in left arm    Onychomycosis    Rotator cuff disorder    S/P TAVR (transcatheter aortic valve replacement) 06/11/2023   s/p TAVR with a 26 mm Edwards S3UR via the TF approach by Dr. Wendel & Maryjane (through PROGRESS CAP TRIAL)   Sixth nerve palsy 09/18/1999    MEDICATIONS: Current Outpatient Medications on File Prior to Visit  Medication Sig Dispense Refill   amoxicillin  (AMOXIL )  500 MG capsule Take 4 capsules (2,000 mg total) by mouth once as needed for up to 1 dose. 1 HOUR PRIOR TO DENTAL CLEANINGS 4 capsule 4   aspirin  EC 81 MG tablet Take 81 mg by mouth in the morning.     betamethasone  dipropionate 0.05 % cream Apply 0.5 application  topically 2 (two) times daily as needed (dry/irritated skin).     Calcium -Magnesium  (CAL-MAG PO) Take 1 tablet by mouth in the morning. With Potassium 500 mg /500 mg /99 mg     Cholecalciferol (VITAMIN D3) 250 MCG (10000 UT) capsule Take 10,000 Units by mouth in the morning, at noon, and at bedtime.     clopidogrel  (PLAVIX ) 75 MG tablet Take 1 tablet (75 mg total) by mouth daily with breakfast. 90 tablet 0   Coenzyme Q10 (CO Q 10 PO) Take 400 mg by mouth in the morning.     finasteride  (PROSCAR ) 5 MG tablet Take 5 mg by mouth in the morning.     furosemide  (LASIX ) 20 MG tablet Take 2 tablets (40 mg total) by mouth daily. 60 tablet 3    gabapentin  (NEURONTIN ) 100 MG capsule Take 1 capsule (100 mg total) by mouth daily as needed. 30 capsule 5   gabapentin  (NEURONTIN ) 600 MG tablet TAKE 1 TABLET AT 4PM AND TAKE 1 TABLET AT BEDTIME. 180 tablet 3   Magnesium  Oxide 400 MG CAPS Take 1 capsule (400 mg total) by mouth daily. 30 capsule 11   metoprolol  succinate (TOPROL -XL) 25 MG 24 hr tablet TAKE ONE TABLET BY MOUTH DAILY 90 tablet 3   Multiple Vitamin (MULTIVITAMIN WITH MINERALS) TABS tablet Take 1 tablet by mouth daily. Centrum Silver     Multiple Vitamins-Minerals (PRESERVISION AREDS 2) CAPS Take 1 capsule by mouth in the morning and at bedtime.     nitroGLYCERIN  (NITROSTAT ) 0.4 MG SL tablet ONE TABLET UNDER TONGUE WHEN NEEDED FOR CHEST PAIN. MAY REPEAT IN 5 MINUTES. 25 tablet 6   pantoprazole  (PROTONIX ) 40 MG tablet Take 1 tablet (40 mg total) by mouth daily. 90 tablet 3   Probiotic Product (PROBIOTIC-10 PO) Take 1 capsule by mouth in the morning.     Propylene Glycol (SYSTANE BALANCE OP) Apply 1 drop to eye as needed.     rosuvastatin  (CRESTOR ) 20 MG tablet Take 1 tablet (20 mg total) by mouth daily. 90 tablet 3   Saw Palmetto 160 MG CAPS Take 160 mg by mouth 2 (two) times daily.     traMADol  (ULTRAM ) 50 MG tablet Take 50 mg by mouth 3 (three) times daily as needed (back pain.).     traZODone  (DESYREL ) 50 MG tablet Take 25 mg by mouth at bedtime as needed for sleep.     Turmeric (QC TUMERIC COMPLEX) 500 MG CAPS Take 500 mg by mouth in the morning.     zolpidem  (AMBIEN ) 10 MG tablet Take 10 mg by mouth at bedtime.     No current facility-administered medications on file prior to visit.    ALLERGIES: No Known Allergies  FAMILY HISTORY: Family History  Problem Relation Age of Onset   Cancer Mother        multiple myeloma   Hypertension Mother    Stroke Father    Heart attack Neg Hx       Objective:  *** General: No acute distress.  Patient appears well-groomed.   Head:  Normocephalic/atraumatic Neck:  Supple.  No  paraspinal tenderness.  Full range of motion. Heart:  Regular rate and rhythm.  Neuro:  Alert and oriented.  Speech fluent and not dysarthric.  Language intact.  CN II-XII intact.  Bulk and tone normal.  Muscle strength 5/5 throughout.  Sensation to light touch intact.  Deep tendon reflexes 2+ throughout, toes downgoing.  Gait normal.  Romberg negative.    Juliene Dunnings, DO  CC: Dorn Sauers, MD

## 2024-04-28 NOTE — Telephone Encounter (Signed)
 Called to confirm/remind patient of their appointment at the [facility] on 04/29/24.   Appointment:   [] Confirmed  [x] Left mess   [] No answer/No voice mail  [] VM Full/unable to leave message  [] Phone not in service  And to bring in all medications and/or complete list.

## 2024-04-29 ENCOUNTER — Ambulatory Visit: Payer: Medicare Other | Admitting: Neurology

## 2024-04-29 ENCOUNTER — Encounter (HOSPITAL_COMMUNITY): Payer: Self-pay

## 2024-04-29 ENCOUNTER — Ambulatory Visit (HOSPITAL_COMMUNITY)
Admission: RE | Admit: 2024-04-29 | Discharge: 2024-04-29 | Disposition: A | Discharge status: Home / Self Care | Source: Ambulatory Visit | Attending: Family Medicine | Admitting: Family Medicine

## 2024-04-29 VITALS — BP 128/52 | HR 67 | Ht 70.0 in | Wt 170.8 lb

## 2024-04-29 DIAGNOSIS — R0602 Shortness of breath: Secondary | ICD-10-CM | POA: Diagnosis present

## 2024-04-29 DIAGNOSIS — I25119 Atherosclerotic heart disease of native coronary artery with unspecified angina pectoris: Secondary | ICD-10-CM | POA: Insufficient documentation

## 2024-04-29 DIAGNOSIS — I77810 Thoracic aortic ectasia: Secondary | ICD-10-CM

## 2024-04-29 DIAGNOSIS — E782 Mixed hyperlipidemia: Secondary | ICD-10-CM | POA: Diagnosis not present

## 2024-04-29 DIAGNOSIS — E785 Hyperlipidemia, unspecified: Secondary | ICD-10-CM | POA: Insufficient documentation

## 2024-04-29 DIAGNOSIS — Z7982 Long term (current) use of aspirin: Secondary | ICD-10-CM | POA: Diagnosis not present

## 2024-04-29 DIAGNOSIS — R42 Dizziness and giddiness: Secondary | ICD-10-CM | POA: Diagnosis not present

## 2024-04-29 DIAGNOSIS — Z952 Presence of prosthetic heart valve: Secondary | ICD-10-CM | POA: Diagnosis not present

## 2024-04-29 DIAGNOSIS — R5383 Other fatigue: Secondary | ICD-10-CM | POA: Insufficient documentation

## 2024-04-29 DIAGNOSIS — Z79899 Other long term (current) drug therapy: Secondary | ICD-10-CM | POA: Insufficient documentation

## 2024-04-29 DIAGNOSIS — I350 Nonrheumatic aortic (valve) stenosis: Secondary | ICD-10-CM | POA: Diagnosis not present

## 2024-04-29 DIAGNOSIS — M1711 Unilateral primary osteoarthritis, right knee: Secondary | ICD-10-CM | POA: Diagnosis not present

## 2024-04-29 DIAGNOSIS — I7121 Aneurysm of the ascending aorta, without rupture: Secondary | ICD-10-CM | POA: Diagnosis not present

## 2024-04-29 DIAGNOSIS — I2510 Atherosclerotic heart disease of native coronary artery without angina pectoris: Secondary | ICD-10-CM | POA: Diagnosis present

## 2024-04-29 LAB — COMPREHENSIVE METABOLIC PANEL WITH GFR
ALT: 21 U/L (ref 0–44)
AST: 24 U/L (ref 15–41)
Albumin: 3.9 g/dL (ref 3.5–5.0)
Alkaline Phosphatase: 53 U/L (ref 38–126)
Anion gap: 10 (ref 5–15)
BUN: 15 mg/dL (ref 8–23)
CO2: 28 mmol/L (ref 22–32)
Calcium: 9.6 mg/dL (ref 8.9–10.3)
Chloride: 100 mmol/L (ref 98–111)
Creatinine, Ser: 1.01 mg/dL (ref 0.61–1.24)
GFR, Estimated: >60 mL/min (ref >60)
Glucose, Bld: 125 mg/dL — ABNORMAL HIGH (ref 70–99)
Potassium: 4 mmol/L (ref 3.5–5.1)
Sodium: 138 mmol/L (ref 135–145)
Total Bilirubin: 0.8 mg/dL (ref 0.0–1.2)
Total Protein: 6.4 g/dL — ABNORMAL LOW (ref 6.5–8.1)

## 2024-04-29 LAB — LIPID PANEL
Cholesterol: 129 mg/dL (ref 0–200)
HDL: 57 mg/dL (ref >40)
LDL Cholesterol: 54 mg/dL (ref 0–99)
Total CHOL/HDL Ratio: 2.3 ratio
Triglycerides: 88 mg/dL (ref <150)
VLDL: 18 mg/dL (ref 0–40)

## 2024-04-29 LAB — BRAIN NATRIURETIC PEPTIDE: B Natriuretic Peptide: 84.5 pg/mL (ref 0.0–100.0)

## 2024-04-29 MED ORDER — FUROSEMIDE 20 MG PO TABS: 20.0000 mg | ORAL_TABLET | Freq: Every day | ORAL | Status: DC

## 2024-04-29 MED ORDER — METOPROLOL SUCCINATE ER 25 MG PO TB24: 12.5000 mg | ORAL_TABLET | Freq: Every day | ORAL | 3 refills | Status: DC

## 2024-04-29 NOTE — Patient Instructions (Addendum)
 Good to see you today!  DECREASE Toprol  to 12.5 mg(1/2 tablet) daily  DECREASE Lasix  to 20 mg daily  Please wear compression hose daily for orthostasis  Labs done today, your results will be available in MyChart, we will contact you for abnormal readings.  Your physician recommends that you schedule a follow-up appointment 6 months(February) Call office in December to schedule an appointment  If you have any questions or concerns before your next appointment please send us  a message through [place] or call our office at [phone].    TO LEAVE A MESSAGE FOR THE NURSE SELECT OPTION 2, PLEASE LEAVE A MESSAGE INCLUDING: YOUR NAME DATE OF BIRTH CALL BACK NUMBER REASON FOR CALL**this is important as we prioritize the call backs  YOU WILL RECEIVE A CALL BACK THE SAME DAY AS LONG AS YOU CALL BEFORE 4:00 PM At the [facility], you and your health needs are our priority. As part of our continuing mission to provide you with exceptional heart care, we have created designated Provider Care Teams. These Care Teams include your primary Cardiologist (physician) and Advanced Practice Providers (APPs- Physician Assistants and Nurse Practitioners) who all work together to provide you with the care you need, when you need it.   You may see any of the following providers on your designated Care Team at your next follow up: Dr Toribio Fuel Dr Ezra Shuck Dr. Ria Commander Dr. Morene Brownie Amy Lenetta, NP Caffie Shed, [place] [facility] [place], [place] Beckey Coe, NP [place] Lee, NP [place] Class, NP Tinnie Redman, PharmD Jaun Bash, PharmD   Please be sure to bring in all your medications bottles to every appointment.    Thank you for choosing [facility]

## 2024-04-29 NOTE — Progress Notes (Signed)
 ReDS Vest / Clip - 04/29/24 1509       ReDS Vest / Clip   Station Marker C    Ruler Value 42.5    ReDS Value Range Low volume    ReDS Actual Value 32

## 2024-04-29 NOTE — Progress Notes (Addendum)
 Patient ID: Danny [name], male   DOB: 08/20/1936, 88 y.o.   MRN: [id number] PCP: Dr. Roseann Cardiology: Dr. Rolan  88 y.o. yo with history of nonobstructive CAD presents for followup of CAD and aortic stenosis.  He last had an ETT-Cardiolite  in 6/16 that was normal.  Echo in 9/17 showed EF 55-60%, mild aortic stenosis, 4.0 cm ascending aorta.  MRA chest in 12/18 showed tortuous thoracic aorta without aneurysm. Echo in 11/19 showed EF 60-65%, mild AS/mild AI.  Echo in 12/21 showed EF 60-65% with mild AI, mild AS.  PYP scan in 12/21 was grade 1 with H/CL 1.15, probably negative for TTR cardiac amyloidosis.   Cardiolite  in 1/23 showed EF 68%, no ischemia/infarction.   Patient has been doing well from a cardiac standpoint.  He has had no exertional dyspnea or chest pain.  He has right knee pain from OA, bothers him when he golfs.  No palpitations. SBP 110s-120s at home.   Echo 12/23 showed EF 60-65%, RV normal, moderate aortic stenosis, AVA 0.81 cm^2, AoV mean gradient 26 mmHg, peak gradient 41 mmHg.  Patient developed exertional dyspnea in 2024.  Workup showed moderate to moderate-severe aortic stenosis by echo.  LHC in 8/24 showed 95% mid LAD stenosis, treated with DES.  Patient had TAVR as part of the PROGRESS-CAP trial for TAVR in moderate AS.  Post-TAVR echo in 10/24 showed EF 60-65%, normal RV, mild MR, stable bioprosthetic aortic valve.   Seen in ED 03/20/24 with hypotension 2/2 to taking SL nitrogylcerin use.   Today he returns for an acute visit with his wife with complaints of fatigue and low BP. BP has been low since July 20025, around the time he started Lasix  daily for LEE. He has SOB walking up inclines or doing yard-work. He has chronic LLE, has compression hose at home. Denies palpitations, abnormal bleeding, CP, dizziness, or PND/Orthopnea. Appetite ok. Weight at home 170 pounds. Taking all medications. BP 90-120s/50-70s, more recently BP 80-low 100s/50s. Finished [facility]m [facility] Cardiac  Rehab.   ReDs reading: 32%, normal  ECG (personally reviewed): none ordered today  Labs (4/21): K 3.8, creatinine 0.83, myeloma panel negative, urine immunofixation negative, LDL 68 Labs (11/22): LDL 45 Labs (12/23): K 4.3, creatinine 0.98, LDL 56 Labs (8/24): Lp(a) 96 Labs (10/24): K 4.6, creatinine 0.88 Labs (12/24): creatinine 0.89, LDL 62 Labs (2/25): Hgb 14.6 Labs (7/25): K 3.5, creatinine 0.98  PMH: 1. Rotator cuff surgery 2. Diverticulosis 3. Chronic persistent hepatitis 4. Impaired fasting glucose 5. 6th nerve palsy in 2001 6. Allergic rhinitis 7. Low back pain s/p several operations.  Most recently had microdiscectomy in 1/17.  8. CAD: Patient developed exertional dyspnea.  ETT-myoview  (8/12) with 11 METS, normal LV EF, normal perfusion at rest and stress but elevated TID ratio.  Coronary CT angiogram in 9/12 showed equivocal significant disease in the CFX.  Cath in 10/12 showed EF 55-60%, 30% mid RCA, 40% distal RCA, 40% proximal LAD.  ETT-Cardiolite  in 10/14 showed EF 62%, probably normal study with diaphragmatic attenuation.   Echo (4/15) with EF 55-60%, mild MR.  - Cardiolite  (6/16) with no ischemia/infarction.  - Echo (9/17) with EF 55-60%, mild aortic stenosis, 4.0 cm ascending aorta.  - Echo (11/19): EF 60-65%, mild AS mean gradient 9 mmHg with AVA 1.53 cm^2, mild AI.  - Echo (12/21): EF 60-65%, mild AS, mild AI.  - Cardiolite  (1/23): EF 68%, no ischemia/infarction. - LHC in 8/24 with 95% mLAD stenosis treated with DES.  9. GERD 10.  Hyperlipidemia: Myalgias with atorvastatin .  11. Aortic stenosis: Moderate on 12/23 echo. With worsening exertional dyspnea, he was enrolled in the PROGRESS CAP trial and had TAVR.   - Post-TAVR echo in 10/24: EF 60-65%, normal RV, mild MR, stable bioprosthetic aortic valve.  12. Ascending aorta dilation: MRA chest (12/18) showed a tortuous ascending aorta without aneurysmal dilation.  13. Peripheral neuropathy of uncertain etiology.   14. PYP scan (12/21): grade 1, H/CL 1.15 (probably negative)  FH: Father with CVA at 30  SH: Married, never smoked, rare ETOH. 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  ROS: All systems reviewed and negative except as per HPI.   Current Outpatient Medications  Medication Sig Dispense Refill   amoxicillin  (AMOXIL ) 500 MG capsule Take 4 capsules (2,000 mg total) by mouth once as needed for up to 1 dose. 1 HOUR PRIOR TO DENTAL CLEANINGS 4 capsule 4   aspirin  EC 81 MG tablet Take 81 mg by mouth in the morning.     Cholecalciferol (VITAMIN D3) 250 MCG (10000 UT) capsule Take 10,000 Units by mouth in the morning, at noon, and at bedtime.     clopidogrel  (PLAVIX ) 75 MG tablet Take 1 tablet (75 mg total) by mouth daily with breakfast. 90 tablet 0   Coenzyme Q10 (CO Q 10 PO) Take 400 mg by mouth in the morning.     finasteride  (PROSCAR ) 5 MG tablet Take 5 mg by mouth in the morning.     fluticasone 0.05%-ketoconazole 2% 1:1 cream mixture Apply 2 % topically 2 (two) times daily.     furosemide  (LASIX ) 20 MG tablet Take 2 tablets (40 mg total) by mouth daily. 60 tablet 3   gabapentin  (NEURONTIN ) 100 MG capsule Take 1 capsule (100 mg total) by mouth daily as needed. 30 capsule 5   gabapentin  (NEURONTIN ) 600 MG tablet TAKE 1 TABLET AT 4PM AND TAKE 1 TABLET AT BEDTIME. 180 tablet 0   Magnesium  Oxide 400 MG CAPS Take 1 capsule (400 mg total) by mouth daily. 30 capsule 11   metoprolol  succinate (TOPROL -XL) 25 MG 24 hr tablet TAKE ONE TABLET BY MOUTH DAILY 90 tablet 3   Multiple Vitamin (MULTIVITAMIN WITH MINERALS) TABS tablet Take 1 tablet by mouth daily. Centrum Silver     Multiple Vitamins-Minerals (PRESERVISION AREDS 2) CAPS Take 1 capsule by mouth in the morning and at bedtime.     nitroGLYCERIN  (NITROSTAT ) 0.4 MG SL tablet ONE TABLET UNDER TONGUE WHEN NEEDED FOR CHEST PAIN. MAY REPEAT IN 5 MINUTES. 25 tablet 6   pantoprazole  (PROTONIX ) 40 MG tablet Take 1 tablet (40  mg total) by mouth daily. (Patient taking differently: Take 40 mg by mouth 2 (two) times daily.) 90 tablet 3   Probiotic Product (PROBIOTIC-10 PO) Take 1 capsule by mouth in the morning.     rosuvastatin  (CRESTOR ) 20 MG tablet Take 1 tablet (20 mg total) by mouth daily. 90 tablet 3   Saw Palmetto 160 MG CAPS Take 160 mg by mouth 2 (two) times daily.     traMADol  (ULTRAM ) 50 MG tablet Take 50 mg by mouth 3 (three) times daily as needed (back pain.). (Patient taking differently: Take 50 mg by mouth 4 (four) times daily.)     Turmeric (QC TUMERIC COMPLEX) 500 MG CAPS Take 500 mg by mouth in the morning.     zolpidem  (AMBIEN ) 10 MG tablet Take 10 mg by mouth at bedtime.     betamethasone  dipropionate 0.05 % cream  Apply 0.5 application  topically 2 (two) times daily as needed (dry/irritated skin).     Calcium -Magnesium  (CAL-MAG PO) Take 1 tablet by mouth in the morning. With Potassium 500 mg /500 mg /99 mg     Propylene Glycol (SYSTANE BALANCE OP) Apply 1 drop to eye as needed.     traZODone  (DESYREL ) 50 MG tablet Take 25 mg by mouth at bedtime as needed for sleep.     No current facility-administered medications for this encounter.   Wt Readings from Last 3 Encounters:  04/29/24 77.5 kg (170 lb 12.8 oz)  03/20/24 77.1 kg (170 lb)  03/10/24 77.4 kg (170 lb 10.2 oz)   BP (!) 128/52   Pulse 67   Ht 5' 10 (1.778 m)   Wt 77.5 kg (170 lb 12.8 oz)   SpO2 97%   BMI 24.51 kg/m   Orthostatics today 04/29/24 Lying: 92/58, 60 Sitting: 100/60, 63 Standing 84/60, 66  Physical Exam General:  NAD. No resp difficulty, walked into clinic, elderly HEENT: Normal Neck: Supple. No JVD. Cor: Regular rate & rhythm. No rubs, gallops or murmurs. Lungs: Clear Abdomen: Soft, nontender, nondistended.  Extremities: No cyanosis, clubbing, rash, edema Neuro: Alert & oriented x 3, moves all 4 extremities w/o difficulty. Affect pleasant.  Assessment/Plan:  1. Dizziness/fatigue: Likely multifactorial, but he is  orthostatic in clinic today.  - Place compression hose - Decrease Toprol  XL to 12.5 mg at bedtime - Decrease Lasix  to 20 mg daily. BMET/BNP today. - Could consider low-dose midodrine next. - He has echo arranged next month. 2. CAD: LHC in 8/24 with 95% mLAD in setting of exertional dyspnea.  He had DES to mLAD.  No chest pain - Continue ASA 81 + Plavix  75.  - Continue Crestor  20 mg daily with goal LDL < 55.   3. Aortic stenosis: Moderate AS. He was enrolled in PROGRESS CAP trial and is now s/p TAVR.  Post-TAVR echo in 10/24 with EF 60-65%, normal RV, mild MR, stable bioprosthetic aortic valve. Mild exertional dyspnea, more prominent fatigue.   - Antibiotics needed with dental work.  4. Hyperlipidemia: Goal LDL < 55 with known CAD.  Myalgias with atorvastatin  Lp(a) 96 in 8/24. LDL 62 (12/24). - Continue Crestor  20 mg daily.  Lipids/LFTs today. - Low threshold for lipid clinic referral for Repatha give elevated Lp(a).  5. Ascending aortic aneurysm: Mild, 4.0 cm on 9/17 echo. MRA chest in 12/18 showed tortuous aorta without frank aneurysmal dilation.     Follow up in 6 months with Dr. Rolan Raisin [facility] FNP-BC 04/29/2024

## 2024-05-01 ENCOUNTER — Ambulatory Visit (HOSPITAL_COMMUNITY): Payer: Self-pay | Admitting: Family Medicine

## 2024-05-06 NOTE — Progress Notes (Unsigned)
 NEUROLOGY FOLLOW UP OFFICE NOTE  Danny [name] [id number]  Assessment/Plan:   Idiopathic polyneuropathy Lumbar spondylosis with history of multiple lumbar spine surgeries   Gabapentin  to 600mg  at 4PM and 600mg  at bedtime and *** as needed. 3.  Follow up 1 year.   Subjective:  Danny [name] is an 88 year old right-handed White male with chronic hepatitis, cervical disc disease, lumbar spine disease s/p multiple surgeries,CAD, s/p TAVR and history of ischemic left sixth nerve palsy who follows up for polyneuropathy.   UPDATE: Current medication:  gabapentin  600mg  at 4PM and 600mg  at bedtime.  Also takes tramadol  50mg  PRN for back pain.              Pain is noticeable during the day when there is rubbing on his feet, such as while wearing shoes or socks.  It interferes when he is out of the house, such as playing golf.  When he can rest without shoes on, it is better.  Massages them every night which helps.  It is worse after on his feet or exercising for a while.  Uses a foot cream with arnica, aloe and I-arginine and also uses a magnesium  balm which help.  ***   HISTORY: In 2008, he began experiencing left upper extremity numbness and tingling following rotator cuff surgery.  It involves only the hand and is fairly constant.  No pain or weakness.  He is still able to perform daily activities such as chores around the house or playing golf.  EMG in 2014 reportedly negative.  Saw Dr. Buck at [facility] in 2014.  NCV-EMG of left upper extremity was reportedly normal.  MRI of cervical spine showed mild degenerative disc disease but no significant central or foraminal stenosis.  MRI of brain showed mild age-related changes but overall unremarkable.  No specific diagnosis was determined.  It has been unchanged over the years.  Repeat NCV-EMG of left upper extremity on 11/25/2019 was normal.     For many years, he has had numbness and tingling in the feet as well as  intermittent muscle cramps in the calves.  He has changed from Lipitor to Crestor  with no improvement.  He typically treats with mustard and apple cider vinegar.  At night, he notes burning and stinging pain in the feet.  Bood work in 2014 showed negative ANA, negative RF, CRP 1.3, Hgb A1c 6.3, CK 116, elevated TSH 5.370, but subsequent TSH has been normal.  He has history of multiple back surgeries.  He had sinal fusion L3-4 in 2010, followed by spinal fusion L2-3 in 2011.  To evaluate low back pain with right leg numbness, he underwent MRI of lumbar spine on 03/26/2015 which showed interbody fusion at L2-3 and L3-4 without significant stenosis and disc degeneration and spurring causing right foraminal encroachment at L4-5 and L5-S1.   He underwent extra-foraminal microdiskectomy at L4-5 and L5-S1 in January 2017 but then developed left lower back pain into the hip.  Myelogram on 07/12/2016 showed solid fusion at L2-3 and L3-4 as well as multilevel subarticular and foraminal stenosis.  NCV-EMG of the lower extremities from 11/30/2019 showed moderate to severe mixed axonal and demyelinating sensorimotor polyneuropathy but no evidence of lumbar radiculopathy, plexopathy or myopathy.  Labs from May 2021 include B12 459, folate >23.7, Mg 2.2, TSH 2.17, free T4 0.83, and SPEP/IFE negative for monoclonal protein.    In June 2021, he developed double vision.  Double vision was only noticeable unless he looks  to the left greater than right.  He reports history of 6th nerve palsy 20 years ago and thought it was a recurrence.  He saw ophthalmology who diagnosed left 6th nerve palsy .  He also had a left sided headache above the left eye, which was previously found to be sinusitis. He saw ENT for possible sinus infection, which was ruled out.  MRI of brain with and without contrast on 05/05/2020 showed chronic small vessel ischemic changes in the cerebral white matter but no brainstem or cavernous sinus abnormalities.  Labs  included sed rate 9, CRP 0.8.and Ace 11.  Diplopia subsequently resolved.  Dull left frontal headache about 3 times a week.  It happens when he gets up in the morning and is resolved by afternoon.  The headaches are chronic, for many years.   Past medication: Lyrica    Imaging: 05/20/2013 MRI CERVICAL SPINE WO:  Mildly abnormal MRI scan of the cervical spine showing exaggerated forward lordotic curvature and minor disc degenerative changes without significant compression. 06/18/2013 MRI BRAIN W WO:  Abnormal MRI scan of the brain showing mild changes of chronic microvascular ischemia and generalized cerebral atrophy. 03/26/2015 MRI LUMBAR SPINE WO:  interbody fusion at L2-3 and L3-4 without significant stenosis.  Lumbar dextroscoliosis.  Disc degeneration and spurring causing right foraminal encroachment.  L4-5 and L5-S1, similar to 2011. 07/12/2016 CT MYELOGRAM LUMBAR SPINE:  1. Solid fusion at L2-3 and L3-4.  2. Progressive adjacent level disease at L1-2 with progressive moderate left and mild right subarticular stenosis.  3. Moderate left foraminal narrowing at L1-2.  4. Moderate left subarticular and mild left foraminal stenosis at L2-3 without significant interval change.  5. Moderate left subarticular and mild left foraminal narrowing at L3-4 is stable. This is predominantly due to facet disease.  6. Mild subarticular and foraminal narrowing bilaterally at L4-5.  7. Mild subarticular narrowing at L5-S1 is worse on the right.  8. Moderate right and mild left foraminal narrowing at L5-S1 is stable. 05/05/2020 MRI BRAIN W WO:  No explanation for 6 nerve palsy. Brainstem and cavernous sinus normal. No orbital mass.  No acute abnormality. Progression of chronic microvascular ischemic change in the white matter compared with MRI of 06/18/2013  PAST MEDICAL HISTORY: Past Medical History:  Diagnosis Date   Allergic rhinitis    Arthritis    BPH (benign prostatic hypertrophy)    Dr. Renda   Cervical  disc disease    Coronary artery disease    Patient developed exertional dyspnea. ETT-myoview  (8/12) with 11 METS, normal LV EF, normal perfusion at rest and stress but elevated TID ratio. Coronary CT angiogram in 9/12 showed equivocal significant disease in the CFX. Cath in 10/12 showed EF 55-60%, 30% mid RCA, 40% distal RCA, 40% proximal LAD. ETT-Myoview  (10/14): Diaphragmatic attenuation, no ischemia, EF 62%, normal study   Detached vitreous humor    Diverticulosis    GERD (gastroesophageal reflux disease)    Hemorrhoids    Impaired fasting glucose    Ingrown toenail    Lumbar radiculopathy    Numbness and tingling in left arm    Onychomycosis    Rotator cuff disorder    S/P TAVR (transcatheter aortic valve replacement) 06/11/2023   s/p TAVR with a 26 mm Edwards S3UR via the TF approach by Dr. Wendel & Maryjane (through PROGRESS CAP TRIAL)   Sixth nerve palsy 09/18/1999    MEDICATIONS: Current Outpatient Medications on File Prior to Visit  Medication Sig Dispense Refill   amoxicillin  (AMOXIL )  500 MG capsule Take 4 capsules (2,000 mg total) by mouth once as needed for up to 1 dose. 1 HOUR PRIOR TO DENTAL CLEANINGS 4 capsule 4   aspirin  EC 81 MG tablet Take 81 mg by mouth in the morning.     betamethasone  dipropionate 0.05 % cream Apply 0.5 application  topically 2 (two) times daily as needed (dry/irritated skin).     Calcium -Magnesium  (CAL-MAG PO) Take 1 tablet by mouth in the morning. With Potassium 500 mg /500 mg /99 mg     Cholecalciferol (VITAMIN D3) 250 MCG (10000 UT) capsule Take 10,000 Units by mouth in the morning, at noon, and at bedtime.     clopidogrel  (PLAVIX ) 75 MG tablet Take 1 tablet (75 mg total) by mouth daily with breakfast. 90 tablet 0   Coenzyme Q10 (CO Q 10 PO) Take 400 mg by mouth in the morning.     finasteride  (PROSCAR ) 5 MG tablet Take 5 mg by mouth in the morning.     fluticasone 0.05%-ketoconazole 2% 1:1 cream mixture Apply 2 % topically 2 (two) times daily.      furosemide  (LASIX ) 20 MG tablet Take 1 tablet (20 mg total) by mouth daily.     gabapentin  (NEURONTIN ) 100 MG capsule Take 1 capsule (100 mg total) by mouth daily as needed. 30 capsule 5   gabapentin  (NEURONTIN ) 600 MG tablet TAKE 1 TABLET AT 4PM AND TAKE 1 TABLET AT BEDTIME. 180 tablet 0   Magnesium  Oxide 400 MG CAPS Take 1 capsule (400 mg total) by mouth daily. 30 capsule 11   metoprolol  succinate (TOPROL -XL) 25 MG 24 hr tablet Take 0.5 tablets (12.5 mg total) by mouth at bedtime. 90 tablet 3   Multiple Vitamin (MULTIVITAMIN WITH MINERALS) TABS tablet Take 1 tablet by mouth daily. Centrum Silver     Multiple Vitamins-Minerals (PRESERVISION AREDS 2) CAPS Take 1 capsule by mouth in the morning and at bedtime.     nitroGLYCERIN  (NITROSTAT ) 0.4 MG SL tablet ONE TABLET UNDER TONGUE WHEN NEEDED FOR CHEST PAIN. MAY REPEAT IN 5 MINUTES. 25 tablet 6   pantoprazole  (PROTONIX ) 40 MG tablet Take 1 tablet (40 mg total) by mouth daily. (Patient taking differently: Take 40 mg by mouth 2 (two) times daily.) 90 tablet 3   Probiotic Product (PROBIOTIC-10 PO) Take 1 capsule by mouth in the morning.     Propylene Glycol (SYSTANE BALANCE OP) Apply 1 drop to eye as needed.     rosuvastatin  (CRESTOR ) 20 MG tablet Take 1 tablet (20 mg total) by mouth daily. 90 tablet 3   Saw Palmetto 160 MG CAPS Take 160 mg by mouth 2 (two) times daily.     traMADol  (ULTRAM ) 50 MG tablet Take 50 mg by mouth 3 (three) times daily as needed (back pain.). (Patient taking differently: Take 50 mg by mouth 4 (four) times daily.)     traZODone  (DESYREL ) 50 MG tablet Take 25 mg by mouth at bedtime as needed for sleep.     Turmeric (QC TUMERIC COMPLEX) 500 MG CAPS Take 500 mg by mouth in the morning.     zolpidem  (AMBIEN ) 10 MG tablet Take 10 mg by mouth at bedtime.     No current facility-administered medications on file prior to visit.    ALLERGIES: No Known Allergies  FAMILY HISTORY: Family History  Problem Relation Age of Onset    Cancer Mother        multiple myeloma   Hypertension Mother    Stroke Father  Heart attack Neg Hx       Objective:  *** General: No acute distress.  Patient appears well-groomed.   Head:  Normocephalic/atraumatic Neck:  Supple.  No paraspinal tenderness.  Full range of motion. Heart:  Regular rate and rhythm. Neuro:  Alert and oriented.  Speech fluent and not dysarthric.  Language intact.  CN II-XII intact.  Bulk and tone normal.  Muscle strength 5/5 throughout.  Sensation to light touch intact.  Deep tendon reflexes 2+ throughout, toes downgoing.  Gait normal.  Romberg negative.    Juliene Dunnings, DO  CC: Dorn Sauers, MD

## 2024-05-08 ENCOUNTER — Encounter: Payer: Self-pay | Admitting: Neurology

## 2024-05-08 ENCOUNTER — Ambulatory Visit (INDEPENDENT_AMBULATORY_CARE_PROVIDER_SITE_OTHER): Admitting: Neurology

## 2024-05-08 VITALS — BP 113/73 | HR 74 | Ht 75.0 in | Wt 168.0 lb

## 2024-05-08 DIAGNOSIS — G609 Hereditary and idiopathic neuropathy, unspecified: Secondary | ICD-10-CM | POA: Diagnosis not present

## 2024-05-08 DIAGNOSIS — Z9889 Other specified postprocedural states: Secondary | ICD-10-CM | POA: Diagnosis not present

## 2024-05-08 MED ORDER — GABAPENTIN 600 MG PO TABS: ORAL_TABLET | ORAL | 3 refills | Status: AC

## 2024-05-08 MED ORDER — GABAPENTIN 100 MG PO CAPS: 100.0000 mg | ORAL_CAPSULE | Freq: Every day | ORAL | 3 refills | Status: AC | PRN

## 2024-05-08 NOTE — Patient Instructions (Signed)
 Gabapentin  600mg  at 4 PM and 600mg  at bedtime Gabapentin  100mg  once during the day as needed.

## 2024-05-25 DIAGNOSIS — M545 Low back pain, unspecified: Secondary | ICD-10-CM | POA: Diagnosis not present

## 2024-06-09 NOTE — Progress Notes (Unsigned)
 [facility]   MULTIDISCIPLINARY HEART VALVE CLINIC                                     Cardiology Office Note:    Date:  06/11/2024   ID:  Danny [name], DOB 1935-12-01, MRN [id number]  PCP:  Charlott Dorn LABOR, MD  [facility] Cardiologist:  Ezra Shuck, MD  [facility] Structural heart: Lurena MARLA Red, MD [facility] Electrophysiologist:  None   Referring MD: Charlott Dorn LABOR, *   1 year s/p TAVR  History of Present Illness:    Danny [name] is a 88 y.o. male with a hx of HLD, 1st deg AV block, peripheral neuropathy, CAD s/p PCI to pLAD (05/02/23), and moderate AS s/p TAVR 06/11/23 through the PROGRESS CAP trial who presents to clinic for follow up.    Danny [name] has been followed by Dr. Shuck for his cardiology care. He underwent cardiac amyloid evaluation in 2021 which was found to be equivocal for transthyretin amyloid and thought to be negative. Serial echocardiograms have shown a preserved ejection fraction however the development of worsening aortic stenosis and associated severe calcification of the aortic valve. Most recent echocardiogram demonstrated moderate aortic stenosis with an aortic valve area of 1.1 cm2 and mean gradient of 33 mmHg with peak velocity of 3.68 m/s with ejection fraction of 60%. He was referred to the structural heart team and was felt to have symptomatic moderate aortic stenosis and was considered for PROGRESS CAP trial and ultimately met all inclusion criteria to proceed. [facility] 05/02/23 showed normal filling pressures, 95% focal proximal LAD stenosis after 2nd diagonal s/p PCI/DES to mLAD. S/p TAVR with a 26 mm Edwards Sapien 3 THV via the TF approach on 06/11/23 through the PROGRESS CAP Trial. 1 month echo showde normal LVEF at 60-65%, mild MR, stable 26mm S3UR TAVR valve with a mean gradient at .   Seen in office on 04/29/24 and noted to have dizziness and orthostasis.Toprol  XL decreased and changed to bedtime.  Lasix  decreased and compression hose added.   Today the patient presents to clinic for follow up. Dizziness has improved with med changes. No CP. Some mild exertional SOB. He has had worsening LE edema since decreasing lasix  but no orthopnea or PND. Recent BNP was normal. No syncope. No blood in stool or urine. No palpitations.     Past Medical History:  Diagnosis Date   Allergic rhinitis    Arthritis    BPH (benign prostatic hypertrophy)    Dr. Renda   Cervical disc disease    Coronary artery disease    Patient developed exertional dyspnea. ETT-myoview  (8/12) with 11 METS, normal LV EF, normal perfusion at rest and stress but elevated TID ratio. Coronary CT angiogram in 9/12 showed equivocal significant disease in the CFX. Cath in 10/12 showed EF 55-60%, 30% mid RCA, 40% distal RCA, 40% proximal LAD. ETT-Myoview  (10/14): Diaphragmatic attenuation, no ischemia, EF 62%, normal study   Detached vitreous humor    Diverticulosis    GERD (gastroesophageal reflux disease)    Hemorrhoids    Impaired fasting glucose    Ingrown toenail    Lumbar radiculopathy    Numbness and tingling in left arm    Onychomycosis    Rotator cuff disorder    S/P TAVR (transcatheter aortic valve replacement) 06/11/2023   s/p TAVR with a 26 mm Edwards S3UR via  the TF approach by Dr. Wendel & Maryjane (through PROGRESS CAP TRIAL)   Sixth nerve palsy 09/18/1999     Current Medications: Current Meds  Medication Sig   Cholecalciferol (VITAMIN D3) 250 MCG (10000 UT) capsule Take 10,000 Units by mouth in the morning, at noon, and at bedtime.   clopidogrel  (PLAVIX ) 75 MG tablet Take 1 tablet (75 mg total) by mouth daily with breakfast.   Coenzyme Q10 (CO Q 10 PO) Take 400 mg by mouth in the morning.   finasteride  (PROSCAR ) 5 MG tablet Take 5 mg by mouth in the morning.   fluticasone 0.05%-ketoconazole 2% 1:1 cream mixture Apply 2 % topically 2 (two) times daily.   gabapentin  (NEURONTIN ) 100 MG capsule Take 1  capsule (100 mg total) by mouth daily as needed.   gabapentin  (NEURONTIN ) 600 MG tablet TAKE 1 TABLET AT 4PM AND TAKE 1 TABLET AT BEDTIME.   Magnesium  Oxide 400 MG CAPS Take 1 capsule (400 mg total) by mouth daily.   Multiple Vitamin (MULTIVITAMIN WITH MINERALS) TABS tablet Take 1 tablet by mouth daily. Centrum Silver   Multiple Vitamins-Minerals (PRESERVISION AREDS 2) CAPS Take 1 capsule by mouth in the morning and at bedtime.   nitroGLYCERIN  (NITROSTAT ) 0.4 MG SL tablet ONE TABLET UNDER TONGUE WHEN NEEDED FOR CHEST PAIN. MAY REPEAT IN 5 MINUTES.   pantoprazole  (PROTONIX ) 40 MG tablet Take 1 tablet (40 mg total) by mouth daily. (Patient taking differently: Take 40 mg by mouth 2 (two) times daily.)   Probiotic Product (PROBIOTIC-10 PO) Take 1 capsule by mouth in the morning.   Propylene Glycol (SYSTANE BALANCE OP) Apply 1 drop to eye as needed.   rosuvastatin  (CRESTOR ) 20 MG tablet Take 1 tablet (20 mg total) by mouth daily.   Saw Palmetto 160 MG CAPS Take 160 mg by mouth 2 (two) times daily.   traMADol  (ULTRAM ) 50 MG tablet Take 50 mg by mouth 3 (three) times daily as needed (back pain.). (Patient taking differently: Take 50 mg by mouth 4 (four) times daily as needed (back pain.).)   Turmeric (QC TUMERIC COMPLEX) 500 MG CAPS Take 500 mg by mouth in the morning.   zolpidem  (AMBIEN ) 10 MG tablet Take 10 mg by mouth at bedtime.   [DISCONTINUED] amoxicillin  (AMOXIL ) 500 MG capsule Take 4 capsules (2,000 mg total) by mouth once as needed for up to 1 dose. 1 HOUR PRIOR TO DENTAL CLEANINGS   [DISCONTINUED] aspirin  EC 81 MG tablet Take 81 mg by mouth in the morning.   [DISCONTINUED] furosemide  (LASIX ) 20 MG tablet Take 1 tablet (20 mg total) by mouth daily.   [DISCONTINUED] metoprolol  succinate (TOPROL -XL) 25 MG 24 hr tablet Take 0.5 tablets (12.5 mg total) by mouth at bedtime.      ROS:   Please see the history of present illness.    All other systems reviewed and are negative.  EKGs        Risk Assessment/Calculations:           Physical Exam:    VS:  BP 136/70   Pulse 74   Ht 5' 9 (1.753 m)   Wt 172 lb 6.4 oz (78.2 kg)   SpO2 98%   BMI 25.46 kg/m     Wt Readings from Last 3 Encounters:  06/11/24 172 lb 6.4 oz (78.2 kg)  05/08/24 168 lb (76.2 kg)  04/29/24 170 lb 12.8 oz (77.5 kg)     GEN: Well nourished, well developed in no acute distress NECK: No JVD CARDIAC:  RRR, no murmurs, rubs, gallops RESPIRATORY:  Clear to auscultation without rales, wheezing or rhonchi  ABDOMEN: Soft, non-tender, non-distended EXTREMITIES:  Mild bilateral LE edema; No deformity.  ASSESSMENT:    1. S/P TAVR (transcatheter aortic valve replacement)   2. Coronary artery disease involving native coronary artery of native heart with angina pectoris   3. Ascending aorta dilatation   4. Orthostatic hypotension   5. Lower extremity edema     PLAN:    In order of problems listed above:  Moderate AS s/p TAVR (through progress cap trial):  -- Echo today shows EF 60%, normally functioning TAVR with a mean gradient of 7 mm hg and trivial PVL.  -- NYHA class II symptoms.  -- CCS I.  -- Continue Plavix  alone ( see below).  -- SBE prophylaxis discussed; I have RX'd amoxicillin .  -- Continue regular follow up with Dr. Rolan.  CAD:  -- Pre-TAVR cath showed a high-grade mid LAD lesion treated with OCT guided PCI with cutting balloon angioplasty and drug-eluting stent x 1 on 05/02/23.  -- He has been on DAPT with ASA 81mg  and Plavix  75mg  daily. Will stop aspirin  and continue him on Plavix  monotherapy.  -- Continue Crestor  5mg  daily and Toprol  25mg  daily.    Dilated ascending aorta:  -- Noted to have dilated aorta measuring 40mm.  -- Continue with surveillance imaging and stable BP control.    Orthostasis: -- Resolved with reduction in Toprol  and Lasix .    LE edema: -- He has had worsening LE edema since Lasix  was reduced for orthostasis, which is bothersome to him.  -- Will  stop Toprol  XL and increase Lasix  back to 40mg  daily.  -- BMET in 2 weeks.   Medication Adjustments/Labs and Tests Ordered: Current medicines are reviewed at length with the patient today.  Concerns regarding medicines are outlined above.  Orders Placed This Encounter  Procedures   Basic Metabolic Panel (BMET)   Meds ordered this encounter  Medications   furosemide  (LASIX ) 40 MG tablet    Sig: Take 1 tablet (40 mg total) by mouth daily.    Dispense:  90 tablet    Refill:  3   amoxicillin  (AMOXIL ) 500 MG capsule    Sig: Take 4 capsules (2,000 mg total) by mouth once as needed for up to 1 dose. 1 HOUR PRIOR TO DENTAL CLEANINGS    Dispense:  12 capsule    Refill:  4    Patient Instructions  Medication Instructions:  Your physician has recommended you make the following change in your medication: STOP Aspirin . STOP Metoprolol . INCREASE Lasix  from 20mg  to 40mg  daily. A new prescription called into your pharmacy. REFILL of Amoxicillin  sent to your pharmacy. Take for dental cleanings and procedures.   *If you need a refill on your cardiac medications before your next appointment, please call your pharmacy*  Lab Work: In TWO weeks (06/25/24) BMET If you have labs (blood work) drawn today and your tests are completely normal, you will receive your results only by: MyChart Message (if you have MyChart) OR A paper copy in the mail If you have any lab test that is abnormal or we need to change your treatment, we will call you to review the results.  Testing/Procedures: None needed  Follow-Up: At [facility], you and your health needs are our priority.  As part of our continuing mission to provide you with exceptional heart care, our providers are all part of one team.  This team includes your  primary Cardiologist (physician) and Advanced Practice Providers or APPs (Physician Assistants and Nurse Practitioners) who all work together to provide you with the care you need, when you  need it.  Your next appointment:   5 month(s)  Provider:   Ezra Shuck, MD    We recommend signing up for the patient portal called MyChart.  Sign up information is provided on this After Visit Summary.  MyChart is used to connect with patients for Virtual Visits (Telemedicine).  Patients are able to view lab/test results, encounter notes, upcoming appointments, etc.  Non-urgent messages can be sent to your provider as well.   To learn more about what you can do with MyChart, go to [email or web address].           Signed, Lamarr Hummer, PA-C  06/11/2024 5:29 PM    [facility]

## 2024-06-11 ENCOUNTER — Ambulatory Visit: Payer: Self-pay | Admitting: Physician Assistant

## 2024-06-11 ENCOUNTER — Ambulatory Visit (INDEPENDENT_AMBULATORY_CARE_PROVIDER_SITE_OTHER): Admitting: Physician Assistant

## 2024-06-11 ENCOUNTER — Ambulatory Visit
Admission: RE | Admit: 2024-06-11 | Discharge: 2024-06-11 | Disposition: A | Discharge status: Home / Self Care | Source: Ambulatory Visit | Attending: Cardiology | Admitting: Cardiology

## 2024-06-11 VITALS — BP 136/70 | HR 74 | Ht 69.0 in | Wt 172.4 lb

## 2024-06-11 DIAGNOSIS — I951 Orthostatic hypotension: Secondary | ICD-10-CM

## 2024-06-11 DIAGNOSIS — I25119 Atherosclerotic heart disease of native coronary artery with unspecified angina pectoris: Secondary | ICD-10-CM

## 2024-06-11 DIAGNOSIS — Z952 Presence of prosthetic heart valve: Secondary | ICD-10-CM

## 2024-06-11 DIAGNOSIS — Z006 Encounter for examination for normal comparison and control in clinical research program: Secondary | ICD-10-CM

## 2024-06-11 DIAGNOSIS — I77810 Thoracic aortic ectasia: Secondary | ICD-10-CM | POA: Insufficient documentation

## 2024-06-11 DIAGNOSIS — I440 Atrioventricular block, first degree: Secondary | ICD-10-CM

## 2024-06-11 DIAGNOSIS — R600 Localized edema: Secondary | ICD-10-CM | POA: Insufficient documentation

## 2024-06-11 MED ORDER — FUROSEMIDE 40 MG PO TABS: 40.0000 mg | ORAL_TABLET | Freq: Every day | ORAL | 3 refills | Status: AC

## 2024-06-11 MED ORDER — AMOXICILLIN 500 MG PO CAPS: 2000.0000 mg | ORAL_CAPSULE | Freq: Once | ORAL | 4 refills | Status: AC | PRN

## 2024-06-11 NOTE — Research (Addendum)
    1 Year Post TAVR   Date:  06/09/2024   BP 136/70 Pulse 74 SPO2 98% Wt 172 lb Ht 5' 9 BMI 25.46   NHYA CLASS:  ll   CCS CLASS:  l   Transthoracic echocardiogram (TTE): 06/11/2024   12- lead electrocardiogram:     KCCQ: See worksheet     [id number]: See worksheet     SF-36:  See worksheet

## 2024-06-11 NOTE — Patient Instructions (Signed)
 Medication Instructions:  Your physician has recommended you make the following change in your medication: STOP Aspirin . STOP Metoprolol . INCREASE Lasix  from 20mg  to 40mg  daily. A new prescription called into your pharmacy. REFILL of Amoxicillin  sent to your pharmacy. Take for dental cleanings and procedures.   *If you need a refill on your cardiac medications before your next appointment, please call your pharmacy*  Lab Work: In TWO weeks (06/25/24) BMET If you have labs (blood work) drawn today and your tests are completely normal, you will receive your results only by: MyChart Message (if you have MyChart) OR A paper copy in the mail If you have any lab test that is abnormal or we need to change your treatment, we will call you to review the results.  Testing/Procedures: None needed  Follow-Up: At [facility], you and your health needs are our priority.  As part of our continuing mission to provide you with exceptional heart care, our providers are all part of one team.  This team includes your primary Cardiologist (physician) and Advanced Practice Providers or APPs (Physician Assistants and Nurse Practitioners) who all work together to provide you with the care you need, when you need it.  Your next appointment:   5 month(s)  Provider:   Ezra Shuck, MD    We recommend signing up for the patient portal called MyChart.  Sign up information is provided on this After Visit Summary.  MyChart is used to connect with patients for Virtual Visits (Telemedicine).  Patients are able to view lab/test results, encounter notes, upcoming appointments, etc.  Non-urgent messages can be sent to your provider as well.   To learn more about what you can do with MyChart, go to [email or web address].

## 2024-06-15 LAB — ECHOCARDIOGRAM COMPLETE
AR max vel: 2.02 cm2
AV Area VTI: 2.25 cm2
AV Area mean vel: 1.95 cm2
AV Mean grad: 7 mmHg
AV Peak grad: 12.7 mmHg
Ao pk vel: 1.78 m/s
Area-P 1/2: 3.17 cm2
S' Lateral: 2.7 cm

## 2024-06-20 DIAGNOSIS — Z23 Encounter for immunization: Secondary | ICD-10-CM | POA: Diagnosis not present

## 2024-06-23 DIAGNOSIS — M48061 Spinal stenosis, lumbar region without neurogenic claudication: Secondary | ICD-10-CM | POA: Diagnosis not present

## 2024-06-25 DIAGNOSIS — Z952 Presence of prosthetic heart valve: Secondary | ICD-10-CM | POA: Diagnosis not present

## 2024-06-26 ENCOUNTER — Ambulatory Visit: Payer: Self-pay | Admitting: Physician Assistant

## 2024-06-26 LAB — BASIC METABOLIC PANEL WITH GFR
BUN/Creatinine Ratio: 12 (ref 10–24)
BUN: 12 mg/dL (ref 8–27)
CO2: 25 mmol/L (ref 20–29)
Calcium: 9.7 mg/dL (ref 8.6–10.2)
Chloride: 100 mmol/L (ref 96–106)
Creatinine, Ser: 0.98 mg/dL (ref 0.76–1.27)
Glucose: 99 mg/dL (ref 70–99)
Potassium: 3.9 mmol/L (ref 3.5–5.2)
Sodium: 140 mmol/L (ref 134–144)
eGFR: 75 mL/min/1.73 (ref >59)

## 2024-07-02 DIAGNOSIS — M4807 Spinal stenosis, lumbosacral region: Secondary | ICD-10-CM | POA: Diagnosis not present

## 2024-07-02 DIAGNOSIS — M4186 Other forms of scoliosis, lumbar region: Secondary | ICD-10-CM | POA: Diagnosis not present

## 2024-07-02 DIAGNOSIS — M47816 Spondylosis without myelopathy or radiculopathy, lumbar region: Secondary | ICD-10-CM | POA: Diagnosis not present

## 2024-07-02 DIAGNOSIS — M5126 Other intervertebral disc displacement, lumbar region: Secondary | ICD-10-CM | POA: Diagnosis not present

## 2024-07-02 DIAGNOSIS — M48061 Spinal stenosis, lumbar region without neurogenic claudication: Secondary | ICD-10-CM | POA: Diagnosis not present

## 2024-07-09 ENCOUNTER — Other Ambulatory Visit: Payer: Self-pay

## 2024-07-13 MED ORDER — CLOPIDOGREL BISULFATE 75 MG PO TABS: 75.0000 mg | ORAL_TABLET | Freq: Every day | ORAL | 3 refills | Status: AC

## 2024-07-14 DIAGNOSIS — R29898 Other symptoms and signs involving the musculoskeletal system: Secondary | ICD-10-CM | POA: Diagnosis not present

## 2024-07-14 DIAGNOSIS — M25552 Pain in left hip: Secondary | ICD-10-CM | POA: Diagnosis not present

## 2024-07-14 DIAGNOSIS — M7062 Trochanteric bursitis, left hip: Secondary | ICD-10-CM | POA: Diagnosis not present

## 2024-07-14 DIAGNOSIS — S76312A Strain of muscle, fascia and tendon of the posterior muscle group at thigh level, left thigh, initial encounter: Secondary | ICD-10-CM | POA: Diagnosis not present

## 2024-07-17 ENCOUNTER — Ambulatory Visit: Admitting: Neurology

## 2024-07-20 DIAGNOSIS — M25552 Pain in left hip: Secondary | ICD-10-CM | POA: Diagnosis not present

## 2024-07-29 DIAGNOSIS — M7072 Other bursitis of hip, left hip: Secondary | ICD-10-CM | POA: Diagnosis not present

## 2024-09-30 ENCOUNTER — Other Ambulatory Visit (HOSPITAL_COMMUNITY): Payer: Self-pay | Admitting: Cardiology

## 2024-10-28 ENCOUNTER — Ambulatory Visit (HOSPITAL_COMMUNITY): Admitting: Cardiology

## 2025-05-11 ENCOUNTER — Ambulatory Visit: Admitting: Neurology
